# Patient Record
Sex: Female | Born: 1935
Health system: Southern US, Community
[De-identification: ages and names within clinical notes are randomized; demographics above are authoritative.]

## PROBLEM LIST (undated history)

## (undated) DIAGNOSIS — M1990 Unspecified osteoarthritis, unspecified site: Secondary | ICD-10-CM

## (undated) DIAGNOSIS — I10 Essential (primary) hypertension: Secondary | ICD-10-CM

## (undated) DIAGNOSIS — C801 Malignant (primary) neoplasm, unspecified: Secondary | ICD-10-CM

## (undated) DIAGNOSIS — E119 Type 2 diabetes mellitus without complications: Secondary | ICD-10-CM

## (undated) DIAGNOSIS — F419 Anxiety disorder, unspecified: Secondary | ICD-10-CM

## (undated) DIAGNOSIS — I509 Heart failure, unspecified: Secondary | ICD-10-CM

## (undated) DIAGNOSIS — L211 Seborrheic infantile dermatitis: Secondary | ICD-10-CM

## (undated) DIAGNOSIS — K5792 Diverticulitis of intestine, part unspecified, without perforation or abscess without bleeding: Secondary | ICD-10-CM

## (undated) DIAGNOSIS — H269 Unspecified cataract: Secondary | ICD-10-CM

## (undated) HISTORY — PX: COLON SURGERY: SHX602

## (undated) HISTORY — DX: Unspecified osteoarthritis, unspecified site: M19.90

## (undated) HISTORY — PX: ABDOMINAL HYSTERECTOMY: SHX81

## (undated) HISTORY — DX: Unspecified cataract: H26.9

## (undated) HISTORY — PX: APPENDECTOMY: SHX54

## (undated) HISTORY — DX: Anxiety disorder, unspecified: F41.9

---

## 2004-07-14 DIAGNOSIS — C801 Malignant (primary) neoplasm, unspecified: Secondary | ICD-10-CM

## 2004-07-14 HISTORY — DX: Malignant (primary) neoplasm, unspecified: C80.1

## 2005-03-04 ENCOUNTER — Ambulatory Visit: Payer: Self-pay | Admitting: Gastroenterology

## 2005-03-11 ENCOUNTER — Ambulatory Visit (HOSPITAL_COMMUNITY): Admission: RE | Admit: 2005-03-11 | Discharge: 2005-03-11 | Payer: Self-pay | Admitting: Internal Medicine

## 2005-03-11 ENCOUNTER — Ambulatory Visit: Payer: Self-pay | Admitting: Internal Medicine

## 2005-03-11 ENCOUNTER — Ambulatory Visit: Payer: Self-pay | Admitting: Cardiology

## 2005-03-11 ENCOUNTER — Encounter (INDEPENDENT_AMBULATORY_CARE_PROVIDER_SITE_OTHER): Payer: Self-pay | Admitting: Internal Medicine

## 2005-03-14 ENCOUNTER — Encounter (INDEPENDENT_AMBULATORY_CARE_PROVIDER_SITE_OTHER): Payer: Self-pay | Admitting: General Surgery

## 2005-03-14 ENCOUNTER — Inpatient Hospital Stay (HOSPITAL_COMMUNITY): Admission: RE | Admit: 2005-03-14 | Discharge: 2005-04-04 | Payer: Self-pay | Admitting: General Surgery

## 2005-05-06 ENCOUNTER — Ambulatory Visit (HOSPITAL_COMMUNITY): Payer: Self-pay | Admitting: Oncology

## 2005-05-06 ENCOUNTER — Encounter (HOSPITAL_COMMUNITY): Admission: RE | Admit: 2005-05-06 | Discharge: 2005-06-05 | Payer: Self-pay | Admitting: Oncology

## 2005-05-06 ENCOUNTER — Encounter: Admission: RE | Admit: 2005-05-06 | Discharge: 2005-05-06 | Payer: Self-pay | Admitting: Oncology

## 2005-05-08 ENCOUNTER — Encounter: Admission: RE | Admit: 2005-05-08 | Discharge: 2005-05-08 | Payer: Self-pay | Admitting: Oncology

## 2005-05-13 ENCOUNTER — Ambulatory Visit (HOSPITAL_COMMUNITY): Admission: RE | Admit: 2005-05-13 | Discharge: 2005-05-13 | Payer: Self-pay | Admitting: General Surgery

## 2005-06-24 ENCOUNTER — Ambulatory Visit (HOSPITAL_COMMUNITY): Payer: Self-pay | Admitting: Oncology

## 2005-06-24 ENCOUNTER — Encounter (HOSPITAL_COMMUNITY): Admission: RE | Admit: 2005-06-24 | Discharge: 2005-07-02 | Payer: Self-pay | Admitting: Oncology

## 2005-06-24 ENCOUNTER — Encounter: Admission: RE | Admit: 2005-06-24 | Discharge: 2005-06-24 | Payer: Self-pay | Admitting: Oncology

## 2005-07-15 ENCOUNTER — Encounter: Admission: RE | Admit: 2005-07-15 | Discharge: 2005-07-15 | Payer: Self-pay | Admitting: Oncology

## 2005-07-15 ENCOUNTER — Encounter (HOSPITAL_COMMUNITY): Admission: RE | Admit: 2005-07-15 | Discharge: 2005-08-14 | Payer: Self-pay | Admitting: Oncology

## 2005-08-07 ENCOUNTER — Encounter (HOSPITAL_COMMUNITY): Admission: RE | Admit: 2005-08-07 | Discharge: 2005-09-06 | Payer: Self-pay | Admitting: Family Medicine

## 2005-08-07 ENCOUNTER — Ambulatory Visit (HOSPITAL_COMMUNITY): Payer: Self-pay | Admitting: Family Medicine

## 2005-08-27 ENCOUNTER — Ambulatory Visit (HOSPITAL_COMMUNITY): Payer: Self-pay | Admitting: Oncology

## 2005-09-12 ENCOUNTER — Encounter: Admission: RE | Admit: 2005-09-12 | Discharge: 2005-09-12 | Payer: Self-pay | Admitting: Oncology

## 2005-09-12 ENCOUNTER — Encounter (HOSPITAL_COMMUNITY): Admission: RE | Admit: 2005-09-12 | Discharge: 2005-10-12 | Payer: Self-pay | Admitting: Oncology

## 2005-11-20 ENCOUNTER — Encounter (HOSPITAL_COMMUNITY): Admission: RE | Admit: 2005-11-20 | Discharge: 2005-12-20 | Payer: Self-pay | Admitting: Oncology

## 2005-11-20 ENCOUNTER — Encounter: Admission: RE | Admit: 2005-11-20 | Discharge: 2005-11-20 | Payer: Self-pay | Admitting: Oncology

## 2005-11-20 ENCOUNTER — Ambulatory Visit (HOSPITAL_COMMUNITY): Payer: Self-pay | Admitting: Oncology

## 2006-01-01 ENCOUNTER — Encounter (HOSPITAL_COMMUNITY): Admission: RE | Admit: 2006-01-01 | Discharge: 2006-01-31 | Payer: Self-pay | Admitting: Oncology

## 2006-01-01 ENCOUNTER — Encounter: Admission: RE | Admit: 2006-01-01 | Discharge: 2006-01-01 | Payer: Self-pay | Admitting: Oncology

## 2006-02-11 ENCOUNTER — Ambulatory Visit (HOSPITAL_COMMUNITY): Payer: Self-pay | Admitting: Oncology

## 2006-02-11 ENCOUNTER — Encounter: Admission: RE | Admit: 2006-02-11 | Discharge: 2006-02-11 | Payer: Self-pay | Admitting: Oncology

## 2006-03-17 ENCOUNTER — Encounter (HOSPITAL_COMMUNITY): Admission: RE | Admit: 2006-03-17 | Discharge: 2006-04-10 | Payer: Self-pay | Admitting: Oncology

## 2006-03-17 ENCOUNTER — Encounter: Admission: RE | Admit: 2006-03-17 | Discharge: 2006-04-10 | Payer: Self-pay | Admitting: Oncology

## 2006-03-25 ENCOUNTER — Ambulatory Visit (HOSPITAL_COMMUNITY): Admission: RE | Admit: 2006-03-25 | Discharge: 2006-03-25 | Payer: Self-pay | Admitting: General Surgery

## 2006-06-16 ENCOUNTER — Encounter (HOSPITAL_COMMUNITY): Admission: RE | Admit: 2006-06-16 | Discharge: 2006-07-13 | Payer: Self-pay | Admitting: Oncology

## 2006-06-16 ENCOUNTER — Ambulatory Visit (HOSPITAL_COMMUNITY): Payer: Self-pay | Admitting: Oncology

## 2006-09-30 ENCOUNTER — Encounter (HOSPITAL_COMMUNITY): Admission: RE | Admit: 2006-09-30 | Discharge: 2006-10-30 | Payer: Self-pay | Admitting: Oncology

## 2006-09-30 ENCOUNTER — Ambulatory Visit (HOSPITAL_COMMUNITY): Payer: Self-pay | Admitting: Oncology

## 2006-11-13 ENCOUNTER — Ambulatory Visit: Payer: Self-pay | Admitting: Internal Medicine

## 2006-11-13 ENCOUNTER — Encounter (INDEPENDENT_AMBULATORY_CARE_PROVIDER_SITE_OTHER): Payer: Self-pay | Admitting: Specialist

## 2006-11-13 ENCOUNTER — Ambulatory Visit (HOSPITAL_COMMUNITY): Admission: RE | Admit: 2006-11-13 | Discharge: 2006-11-13 | Payer: Self-pay | Admitting: Internal Medicine

## 2006-12-31 ENCOUNTER — Emergency Department (HOSPITAL_COMMUNITY): Admission: EM | Admit: 2006-12-31 | Discharge: 2006-12-31 | Payer: Self-pay | Admitting: Emergency Medicine

## 2007-01-01 ENCOUNTER — Ambulatory Visit (HOSPITAL_COMMUNITY): Payer: Self-pay | Admitting: Oncology

## 2007-01-01 ENCOUNTER — Encounter (HOSPITAL_COMMUNITY): Admission: RE | Admit: 2007-01-01 | Discharge: 2007-01-31 | Payer: Self-pay | Admitting: Oncology

## 2007-01-11 ENCOUNTER — Ambulatory Visit: Payer: Self-pay | Admitting: Internal Medicine

## 2007-01-13 ENCOUNTER — Ambulatory Visit: Payer: Self-pay | Admitting: Cardiovascular Disease

## 2007-01-13 ENCOUNTER — Encounter (HOSPITAL_COMMUNITY): Admission: RE | Admit: 2007-01-13 | Discharge: 2007-02-12 | Payer: Self-pay | Admitting: Internal Medicine

## 2007-04-01 ENCOUNTER — Encounter (HOSPITAL_COMMUNITY): Admission: RE | Admit: 2007-04-01 | Discharge: 2007-04-13 | Payer: Self-pay | Admitting: Oncology

## 2007-04-01 ENCOUNTER — Ambulatory Visit (HOSPITAL_COMMUNITY): Payer: Self-pay | Admitting: Oncology

## 2007-07-01 ENCOUNTER — Ambulatory Visit (HOSPITAL_COMMUNITY): Payer: Self-pay | Admitting: Oncology

## 2007-07-01 ENCOUNTER — Encounter (HOSPITAL_COMMUNITY): Admission: RE | Admit: 2007-07-01 | Discharge: 2007-07-14 | Payer: Self-pay | Admitting: Oncology

## 2007-09-23 ENCOUNTER — Ambulatory Visit (HOSPITAL_COMMUNITY): Payer: Self-pay | Admitting: Oncology

## 2007-09-23 ENCOUNTER — Encounter (HOSPITAL_COMMUNITY): Admission: RE | Admit: 2007-09-23 | Discharge: 2007-10-23 | Payer: Self-pay | Admitting: Oncology

## 2007-12-16 ENCOUNTER — Ambulatory Visit (HOSPITAL_COMMUNITY): Payer: Self-pay | Admitting: Oncology

## 2007-12-16 ENCOUNTER — Encounter (HOSPITAL_COMMUNITY): Admission: RE | Admit: 2007-12-16 | Discharge: 2008-01-15 | Payer: Self-pay | Admitting: Oncology

## 2008-02-23 ENCOUNTER — Inpatient Hospital Stay (HOSPITAL_COMMUNITY): Admission: EM | Admit: 2008-02-23 | Discharge: 2008-02-26 | Payer: Self-pay | Admitting: Emergency Medicine

## 2008-04-03 ENCOUNTER — Ambulatory Visit (HOSPITAL_COMMUNITY): Payer: Self-pay | Admitting: Oncology

## 2008-04-03 ENCOUNTER — Encounter (HOSPITAL_COMMUNITY): Admission: RE | Admit: 2008-04-03 | Discharge: 2008-04-10 | Payer: Self-pay | Admitting: Oncology

## 2008-06-06 ENCOUNTER — Encounter (HOSPITAL_COMMUNITY): Admission: RE | Admit: 2008-06-06 | Discharge: 2008-07-06 | Payer: Self-pay | Admitting: Oncology

## 2008-06-06 ENCOUNTER — Ambulatory Visit (HOSPITAL_COMMUNITY): Payer: Self-pay | Admitting: Oncology

## 2008-08-21 ENCOUNTER — Encounter (HOSPITAL_COMMUNITY): Admission: RE | Admit: 2008-08-21 | Discharge: 2008-09-20 | Payer: Self-pay | Admitting: Oncology

## 2008-08-21 ENCOUNTER — Ambulatory Visit (HOSPITAL_COMMUNITY): Payer: Self-pay | Admitting: Oncology

## 2008-12-04 ENCOUNTER — Ambulatory Visit (HOSPITAL_COMMUNITY): Payer: Self-pay | Admitting: Oncology

## 2008-12-04 ENCOUNTER — Encounter (HOSPITAL_COMMUNITY): Admission: RE | Admit: 2008-12-04 | Discharge: 2009-01-03 | Payer: Self-pay | Admitting: Oncology

## 2009-02-15 ENCOUNTER — Emergency Department (HOSPITAL_COMMUNITY): Admission: EM | Admit: 2009-02-15 | Discharge: 2009-02-15 | Payer: Self-pay | Admitting: Emergency Medicine

## 2009-02-26 ENCOUNTER — Ambulatory Visit (HOSPITAL_COMMUNITY): Payer: Self-pay | Admitting: Oncology

## 2009-02-26 ENCOUNTER — Encounter (HOSPITAL_COMMUNITY): Admission: RE | Admit: 2009-02-26 | Discharge: 2009-03-28 | Payer: Self-pay | Admitting: Oncology

## 2009-05-21 ENCOUNTER — Ambulatory Visit (HOSPITAL_COMMUNITY): Payer: Self-pay | Admitting: Oncology

## 2009-05-21 ENCOUNTER — Encounter (HOSPITAL_COMMUNITY): Admission: RE | Admit: 2009-05-21 | Discharge: 2009-06-20 | Payer: Self-pay | Admitting: Oncology

## 2009-08-13 ENCOUNTER — Encounter (HOSPITAL_COMMUNITY): Admission: RE | Admit: 2009-08-13 | Discharge: 2009-09-12 | Payer: Self-pay | Admitting: Oncology

## 2009-08-13 ENCOUNTER — Ambulatory Visit (HOSPITAL_COMMUNITY): Payer: Self-pay | Admitting: Oncology

## 2009-10-29 ENCOUNTER — Encounter (HOSPITAL_COMMUNITY): Admission: RE | Admit: 2009-10-29 | Discharge: 2009-11-28 | Payer: Self-pay | Admitting: Oncology

## 2009-10-29 ENCOUNTER — Ambulatory Visit (HOSPITAL_COMMUNITY): Payer: Self-pay | Admitting: Oncology

## 2010-01-21 ENCOUNTER — Ambulatory Visit (HOSPITAL_COMMUNITY): Payer: Self-pay | Admitting: Oncology

## 2010-01-21 ENCOUNTER — Encounter (HOSPITAL_COMMUNITY): Admission: RE | Admit: 2010-01-21 | Discharge: 2010-02-20 | Payer: Self-pay | Admitting: Oncology

## 2010-05-13 ENCOUNTER — Encounter (HOSPITAL_COMMUNITY)
Admission: RE | Admit: 2010-05-13 | Discharge: 2010-06-12 | Payer: Self-pay | Source: Home / Self Care | Admitting: Oncology

## 2010-05-13 ENCOUNTER — Ambulatory Visit (HOSPITAL_COMMUNITY): Payer: Self-pay | Admitting: Oncology

## 2010-07-30 ENCOUNTER — Inpatient Hospital Stay (HOSPITAL_COMMUNITY)
Admission: EM | Admit: 2010-07-30 | Discharge: 2010-08-03 | Payer: Self-pay | Source: Home / Self Care | Attending: General Surgery | Admitting: General Surgery

## 2010-07-31 LAB — URINALYSIS, ROUTINE W REFLEX MICROSCOPIC
Bilirubin Urine: NEGATIVE
Hgb urine dipstick: NEGATIVE
Ketones, ur: NEGATIVE mg/dL
Nitrite: NEGATIVE
Protein, ur: NEGATIVE mg/dL
Specific Gravity, Urine: 1.025 (ref 1.005–1.030)
Urine Glucose, Fasting: NEGATIVE mg/dL
Urobilinogen, UA: 0.2 mg/dL (ref 0.0–1.0)
pH: 6 (ref 5.0–8.0)

## 2010-07-31 LAB — DIFFERENTIAL
Basophils Absolute: 0 10*3/uL (ref 0.0–0.1)
Basophils Relative: 0 % (ref 0–1)
Eosinophils Absolute: 0 10*3/uL (ref 0.0–0.7)
Eosinophils Relative: 0 % (ref 0–5)
Lymphocytes Relative: 11 % — ABNORMAL LOW (ref 12–46)
Lymphs Abs: 1.8 10*3/uL (ref 0.7–4.0)
Monocytes Absolute: 0.9 10*3/uL (ref 0.1–1.0)
Monocytes Relative: 5 % (ref 3–12)
Neutro Abs: 13.3 10*3/uL — ABNORMAL HIGH (ref 1.7–7.7)
Neutrophils Relative %: 83 % — ABNORMAL HIGH (ref 43–77)

## 2010-07-31 LAB — CBC
HCT: 39.2 % (ref 36.0–46.0)
Hemoglobin: 13.9 g/dL (ref 12.0–15.0)
MCH: 29.3 pg (ref 26.0–34.0)
MCHC: 35.5 g/dL (ref 30.0–36.0)
MCV: 82.5 fL (ref 78.0–100.0)
Platelets: 337 10*3/uL (ref 150–400)
RBC: 4.75 MIL/uL (ref 3.87–5.11)
RDW: 13 % (ref 11.5–15.5)
WBC: 16 10*3/uL — ABNORMAL HIGH (ref 4.0–10.5)

## 2010-07-31 LAB — HEPATIC FUNCTION PANEL
ALT: 17 U/L (ref 0–35)
AST: 19 U/L (ref 0–37)
Albumin: 3.2 g/dL — ABNORMAL LOW (ref 3.5–5.2)
Alkaline Phosphatase: 65 U/L (ref 39–117)
Bilirubin, Direct: 0.1 mg/dL (ref 0.0–0.3)
Indirect Bilirubin: 0.2 mg/dL — ABNORMAL LOW (ref 0.3–0.9)
Total Bilirubin: 0.3 mg/dL (ref 0.3–1.2)
Total Protein: 7.6 g/dL (ref 6.0–8.3)

## 2010-07-31 LAB — BASIC METABOLIC PANEL
BUN: 18 mg/dL (ref 6–23)
CO2: 23 mEq/L (ref 19–32)
Calcium: 9.3 mg/dL (ref 8.4–10.5)
Chloride: 102 mEq/L (ref 96–112)
Creatinine, Ser: 0.81 mg/dL (ref 0.4–1.2)
GFR calc Af Amer: >60 mL/min (ref >60)
GFR calc non Af Amer: >60 mL/min (ref >60)
Glucose, Bld: 254 mg/dL — ABNORMAL HIGH (ref 70–99)
Potassium: 4.3 mEq/L (ref 3.5–5.1)
Sodium: 134 mEq/L — ABNORMAL LOW (ref 135–145)

## 2010-07-31 LAB — LIPASE, BLOOD: Lipase: 19 U/L (ref 11–59)

## 2010-08-05 ENCOUNTER — Encounter (HOSPITAL_COMMUNITY): Payer: Self-pay | Admitting: Oncology

## 2010-08-05 LAB — GLUCOSE, CAPILLARY
Glucose-Capillary: 210 mg/dL — ABNORMAL HIGH (ref 70–99)
Glucose-Capillary: 141 mg/dL — ABNORMAL HIGH (ref 70–99)
Glucose-Capillary: 129 mg/dL — ABNORMAL HIGH (ref 70–99)
Glucose-Capillary: 134 mg/dL — ABNORMAL HIGH (ref 70–99)
Glucose-Capillary: 172 mg/dL — ABNORMAL HIGH (ref 70–99)
Glucose-Capillary: 227 mg/dL — ABNORMAL HIGH (ref 70–99)

## 2010-08-05 LAB — DIFFERENTIAL
Basophils Absolute: 0 10*3/uL (ref 0.0–0.1)
Basophils Absolute: 0 10*3/uL (ref 0.0–0.1)
Basophils Relative: 0 % (ref 0–1)
Eosinophils Relative: 1 % (ref 0–5)
Eosinophils Relative: 3 % (ref 0–5)
Lymphocytes Relative: 32 % (ref 12–46)
Lymphocytes Relative: 33 % (ref 12–46)
Lymphocytes Relative: 28 % (ref 12–46)
Lymphs Abs: 2.4 10*3/uL (ref 0.7–4.0)
Monocytes Absolute: 0.9 10*3/uL (ref 0.1–1.0)
Monocytes Relative: 8 % (ref 3–12)
Monocytes Relative: 8 % (ref 3–12)
Neutro Abs: 6 10*3/uL (ref 1.7–7.7)
Neutro Abs: 4.2 10*3/uL (ref 1.7–7.7)
Neutrophils Relative %: 56 % (ref 43–77)
Neutrophils Relative %: 60 % (ref 43–77)

## 2010-08-05 LAB — CBC
HCT: 35.7 % — ABNORMAL LOW (ref 36.0–46.0)
HCT: 35.7 % — ABNORMAL LOW (ref 36.0–46.0)
HCT: 37 % (ref 36.0–46.0)
Hemoglobin: 12.3 g/dL (ref 12.0–15.0)
Hemoglobin: 12.8 g/dL (ref 12.0–15.0)
MCH: 29 pg (ref 26.0–34.0)
MCH: 29.4 pg (ref 26.0–34.0)
MCHC: 34.5 g/dL (ref 30.0–36.0)
MCHC: 34.6 g/dL (ref 30.0–36.0)
MCV: 84.2 fL (ref 78.0–100.0)
MCV: 85.4 fL (ref 78.0–100.0)
RBC: 4.18 MIL/uL (ref 3.87–5.11)
RBC: 4.39 MIL/uL (ref 3.87–5.11)
RDW: 13.3 % (ref 11.5–15.5)
WBC: 7.3 10*3/uL (ref 4.0–10.5)
WBC: 7.1 10*3/uL (ref 4.0–10.5)

## 2010-08-05 LAB — BASIC METABOLIC PANEL
CO2: 24 mEq/L (ref 19–32)
CO2: 25 mEq/L (ref 19–32)
Calcium: 8.4 mg/dL (ref 8.4–10.5)
Chloride: 106 mEq/L (ref 96–112)
Creatinine, Ser: 0.8 mg/dL (ref 0.4–1.2)
GFR calc Af Amer: >60 mL/min (ref >60)
GFR calc Af Amer: >60 mL/min (ref >60)
Glucose, Bld: 196 mg/dL — ABNORMAL HIGH (ref 70–99)
Glucose, Bld: 141 mg/dL — ABNORMAL HIGH (ref 70–99)
Potassium: 3.9 mEq/L (ref 3.5–5.1)
Sodium: 138 mEq/L (ref 135–145)

## 2010-08-05 LAB — COMPREHENSIVE METABOLIC PANEL
ALT: 12 U/L (ref 0–35)
BUN: 16 mg/dL (ref 6–23)
CO2: 25 mEq/L (ref 19–32)
Calcium: 8.7 mg/dL (ref 8.4–10.5)
Creatinine, Ser: 0.85 mg/dL (ref 0.4–1.2)
GFR calc non Af Amer: >60 mL/min (ref >60)
Glucose, Bld: 198 mg/dL — ABNORMAL HIGH (ref 70–99)
Total Protein: 6.4 g/dL (ref 6.0–8.3)

## 2010-08-05 LAB — PHOSPHORUS
Phosphorus: 2.6 mg/dL (ref 2.3–4.6)
Phosphorus: 2.8 mg/dL (ref 2.3–4.6)

## 2010-08-05 LAB — MRSA PCR SCREENING: MRSA by PCR: NEGATIVE

## 2010-08-06 LAB — CBC
HCT: 34.7 % — ABNORMAL LOW (ref 36.0–46.0)
Hemoglobin: 12.1 g/dL (ref 12.0–15.0)
MCH: 29.4 pg (ref 26.0–34.0)
MCHC: 34.9 g/dL (ref 30.0–36.0)
RDW: 13 % (ref 11.5–15.5)

## 2010-08-06 LAB — GLUCOSE, CAPILLARY: Glucose-Capillary: 217 mg/dL — ABNORMAL HIGH (ref 70–99)

## 2010-08-06 LAB — BASIC METABOLIC PANEL
CO2: 25 mEq/L (ref 19–32)
Calcium: 8.4 mg/dL (ref 8.4–10.5)
Creatinine, Ser: 0.82 mg/dL (ref 0.4–1.2)
GFR calc non Af Amer: >60 mL/min (ref >60)
Glucose, Bld: 223 mg/dL — ABNORMAL HIGH (ref 70–99)

## 2010-08-06 LAB — DIFFERENTIAL
Basophils Relative: 0 % (ref 0–1)
Eosinophils Relative: 4 % (ref 0–5)
Lymphocytes Relative: 31 % (ref 12–46)
Monocytes Absolute: 0.7 10*3/uL (ref 0.1–1.0)
Monocytes Relative: 9 % (ref 3–12)
Neutro Abs: 4 10*3/uL (ref 1.7–7.7)

## 2010-08-16 ENCOUNTER — Emergency Department (HOSPITAL_COMMUNITY)
Admission: EM | Admit: 2010-08-16 | Discharge: 2010-08-16 | Disposition: A | Discharge status: Home / Self Care | Payer: Medicare Other | Attending: Emergency Medicine | Admitting: Emergency Medicine

## 2010-08-16 ENCOUNTER — Emergency Department (HOSPITAL_COMMUNITY): Payer: Medicare Other

## 2010-08-16 DIAGNOSIS — E119 Type 2 diabetes mellitus without complications: Secondary | ICD-10-CM | POA: Insufficient documentation

## 2010-08-16 DIAGNOSIS — Z79899 Other long term (current) drug therapy: Secondary | ICD-10-CM | POA: Insufficient documentation

## 2010-08-16 DIAGNOSIS — K5900 Constipation, unspecified: Secondary | ICD-10-CM | POA: Insufficient documentation

## 2010-08-16 DIAGNOSIS — I2510 Atherosclerotic heart disease of native coronary artery without angina pectoris: Secondary | ICD-10-CM | POA: Insufficient documentation

## 2010-08-16 DIAGNOSIS — R630 Anorexia: Secondary | ICD-10-CM | POA: Insufficient documentation

## 2010-08-16 DIAGNOSIS — I10 Essential (primary) hypertension: Secondary | ICD-10-CM | POA: Insufficient documentation

## 2010-08-16 DIAGNOSIS — E785 Hyperlipidemia, unspecified: Secondary | ICD-10-CM | POA: Insufficient documentation

## 2010-08-16 DIAGNOSIS — R1915 Other abnormal bowel sounds: Secondary | ICD-10-CM | POA: Insufficient documentation

## 2010-08-16 DIAGNOSIS — Z85038 Personal history of other malignant neoplasm of large intestine: Secondary | ICD-10-CM | POA: Insufficient documentation

## 2010-08-16 DIAGNOSIS — Z7982 Long term (current) use of aspirin: Secondary | ICD-10-CM | POA: Insufficient documentation

## 2010-08-16 DIAGNOSIS — Z794 Long term (current) use of insulin: Secondary | ICD-10-CM | POA: Insufficient documentation

## 2010-08-16 DIAGNOSIS — R1031 Right lower quadrant pain: Secondary | ICD-10-CM | POA: Insufficient documentation

## 2010-08-16 LAB — URINALYSIS, ROUTINE W REFLEX MICROSCOPIC
Protein, ur: NEGATIVE mg/dL
Urine Glucose, Fasting: NEGATIVE mg/dL
Urobilinogen, UA: 0.2 mg/dL (ref 0.0–1.0)

## 2010-08-16 LAB — COMPREHENSIVE METABOLIC PANEL
Alkaline Phosphatase: 65 U/L (ref 39–117)
BUN: 21 mg/dL (ref 6–23)
Creatinine, Ser: 1.05 mg/dL (ref 0.4–1.2)
Glucose, Bld: 158 mg/dL — ABNORMAL HIGH (ref 70–99)
Potassium: 4.5 mEq/L (ref 3.5–5.1)
Total Protein: 8.3 g/dL (ref 6.0–8.3)

## 2010-08-16 LAB — DIFFERENTIAL
Eosinophils Relative: 2 % (ref 0–5)
Lymphocytes Relative: 22 % (ref 12–46)
Lymphs Abs: 2.9 10*3/uL (ref 0.7–4.0)
Monocytes Absolute: 1 10*3/uL (ref 0.1–1.0)
Monocytes Relative: 8 % (ref 3–12)

## 2010-08-16 LAB — CBC
HCT: 36.8 % (ref 36.0–46.0)
MCH: 28.6 pg (ref 26.0–34.0)
MCHC: 33.4 g/dL (ref 30.0–36.0)
MCV: 85.6 fL (ref 78.0–100.0)
RDW: 13.4 % (ref 11.5–15.5)
WBC: 13 10*3/uL — ABNORMAL HIGH (ref 4.0–10.5)

## 2010-08-16 LAB — URINE MICROSCOPIC-ADD ON

## 2010-08-28 NOTE — H&P (Signed)
[name], [name]                ACCOUNT NO.:  [id number]  MEDICAL RECORD NO.:  [id number]          PATIENT TYPE:  INP  LOCATION:  A217                          FACILITY:  [facility]  PHYSICIAN:  Tilford Pillar, MD      DATE OF BIRTH:  03-10-1931  DATE OF ADMISSION:  07/30/2010 DATE OF DISCHARGE:  LH                             HISTORY & PHYSICAL   ADMISSION DIAGNOSES:  Small bowel obstruction.  HISTORY OF PRESENT ILLNESS:  The patient is a 77 year old female who presented to [facility] with several day history of worsening nausea, vomiting, abdominal pain.  She actually had some diarrhea over the weekend and had taken some antidiarrheal medications.  Since that time, has not had any bowel function, has noted increased distention and episodes of nausea.  She did have several episodes of emesis prior to the emergent evaluation in the emergency department.  She has not had any other symptomatology similar to this.  She denies any hematemesis. She has not had any melena or hematochezia prior to her decreased bowel function.  She has noted a colicky pain, which has improved slightly since she has been n.p.o. after her initial evaluation by the emergency room physician.  PAST MEDICAL HISTORY:  Hypertension, hypercholesterolemia, anxiety, diabetes.  PAST SURGICAL HISTORY:  Hysterectomy, tonsils and adenoidectomy.  MEDICATIONS:  Benicar, simvastatin, Xanax, Lantus, NovoLog, aspirin 81 mg, amlodipine.  ALLERGIES:  No known drug allergies.  SOCIAL HISTORY:  No tobacco, no alcohol, no recreational drug abuse.  FAMILY HISTORY:  Noncontributory.  REVIEW OF SYSTEMS:  CONSTITUTIONAL:  Unremarkable.  EYES:  Unremarkable. EARS, NOSE, AND THROAT:  Unremarkable.  RESPIRATORY:  Unremarkable. CARDIOVASCULAR:  Unremarkable.  GASTROINTESTINAL:  As per HPI. GENITOURINARY:  Unremarkable.  MUSCULOSKELETAL:  Unremarkable.  SKIN: Unremarkable.  NEUROLOGIC:  Unremarkable.  ENDOCRINE:   Unremarkable.  PHYSICAL EXAMINATION:  VITAL SIGNS:  Temperature 97.5, heart rate 70, respirations 18, blood pressure 143/73.  She is 93% O2 saturation on room air. GENERAL:  She is not in any acute distress.  She is alert and oriented x3. HEENT:  Scalp no deformities or masses.  Eyes: Pupils equal, round, and reactive.  Extraocular movements are intact.  No scleral icterus or conjunctival pallor is noted.  Oral mucosa pink.  Normal occlusion. NECK:  Trachea is midline.  No cervical lymphadenopathy. PULMONARY:  Unlabored respiration.  She is clear to auscultation bilaterally. CARDIOVASCULAR:  Regular rate and rhythm.  The murmurs or gallops.  She has 2+ radial, femoral, and dorsalis pedis pulses bilaterally. ABDOMEN:  Diminished bowel sounds.  Abdomen is soft, distended, mildly tender diffusely, but no peritoneal signs are elicited.  No hernias or masses are apparent. EXTREMITIES:  Warm and dry.  PERTINENT LABORATORY AND RADIOGRAPHIC STUDIES:  CBC, white blood cell count 16.0 on admission, hemoglobin 13.9, hematocrit 39.2, platelets 337.  WBCs on the morning of admission was 10.2.  Basic metabolic panel, sodium 136, potassium 4.0, chloride 105, bicarb 25, BUN 16, creatinine 0.85, blood glucose 198.  CT of the abdomen and pelvis demonstrates several loops of distended small bowel.  There is no clear transition point, no significant free  air.  There is free fluid noted within the pelvis.  No hernias or masses are apparent.  ASSESSMENT AND PLAN:  Small bowel obstruction.  At this time, findings were discussed with the patient.  At this time, we will continue the patient in n.p.o. status, continue IV fluid hydration, continue bowel rest.  The indications for surgical intervention including increasing leukocytosis, increased pain, increased temperature or tachycardia or hypotension were discussed with the patient as well as non-progression should she continued to have non-resolution of  her symptomatology.  At this time, we will continue conservative management.  The patient understands this and is agreeable to current plan.  She will be admitted for continued monitoring and management.     Tilford Pillar, MD     BZ/MEDQ  D:  07/31/2010  T:  08/01/2010  Job:  [id number]  cc:   Dr. Margo Aye  Electronically Signed by Tilford Pillar MD on 08/28/2010 11:08:50 AM

## 2010-09-11 NOTE — Discharge Summary (Addendum)
  [name], [name]                ACCOUNT NO.:  [id number]  MEDICAL RECORD NO.:  [id number]          PATIENT TYPE:  E  LOCATION:  MCED                         FACILITY:  [facility]  PHYSICIAN:  Tilford Pillar, MD      DATE OF BIRTH:  06/12/1931  DATE OF ADMISSION:  07/30/2010 DATE OF DISCHARGE:  01/21/2012LH                              DISCHARGE SUMMARY   ADMISSION DIAGNOSIS:  Small bowel obstruction.  DISCHARGE DIAGNOSES: 1. Resolution of small bowel obstruction. 2. Hypertension. 3. Hypercholesterolemia. 4. Anxiety. 5. Diabetes.  DISPOSITION:  Home.  BRIEF HISTORY AND PHYSICAL:  Please see the admission history and physical for the complete H and P.  The patient is a 77 year old female who presented to [facility] with worsening of abdominal pain. Her symptoms aside her symptoms in evaluation and physical exam findings were consistent with a suspected small bowel obstruction.  She was admitted for continued management intervention.  HOSPITAL COURSE:  The patient was admitted on July 30, 2010.  She was kept on n.p.o. status was treated conservatively for suspected small bowel obstruction due to adhesions due to the significant amount of abdominal surgeries the patient has undergone previously undergone.  At this point, she is continued on IV fluid hydration.  Her symptomatologies slowly started to improve.  She did start passing flatus and had bowel function.  At which time, she was advanced back on a diet slowly as tolerated.  Upon tolerating a regular diet and no return of her symptoms and no issues with her bowel function.  She was discharged to home.  She was discharged on August 03, 2010.  DISCHARGE INSTRUCTIONS:  She was instructed to continue activity as tolerated.  She is to avoid high residual diet.  Although she may resume a diet as tolerated, she is return to see me as needed.  She is to call if she had any questions or concerns.  DISCHARGE  MEDICATIONS:  Please see the discharge medication reconciliation sheet for all discharge medications.     Tilford Pillar, MD     BZ/MEDQ  D:  09/04/2010  T:  09/05/2010  Job:  [id number]  Electronically Signed by Tilford Pillar MD on 11/27/2010 10:34:39 AM

## 2010-09-25 LAB — COMPREHENSIVE METABOLIC PANEL
BUN: 17 mg/dL (ref 6–23)
CO2: 23 mEq/L (ref 19–32)
Chloride: 106 mEq/L (ref 96–112)
Creatinine, Ser: 0.83 mg/dL (ref 0.4–1.2)
GFR calc non Af Amer: >60 mL/min (ref >60)
Glucose, Bld: 110 mg/dL — ABNORMAL HIGH (ref 70–99)
Total Bilirubin: 0.2 mg/dL — ABNORMAL LOW (ref 0.3–1.2)

## 2010-09-25 LAB — CBC
HCT: 38 % (ref 36.0–46.0)
Hemoglobin: 12.7 g/dL (ref 12.0–15.0)
MCH: 29.3 pg (ref 26.0–34.0)
MCHC: 33.5 g/dL (ref 30.0–36.0)

## 2010-09-29 LAB — COMPREHENSIVE METABOLIC PANEL
ALT: 15 U/L (ref 0–35)
AST: 20 U/L (ref 0–37)
AST: 19 U/L (ref 0–37)
Alkaline Phosphatase: 67 U/L (ref 39–117)
CO2: 23 mEq/L (ref 19–32)
CO2: 23 mEq/L (ref 19–32)
Calcium: 9 mg/dL (ref 8.4–10.5)
Calcium: 9.1 mg/dL (ref 8.4–10.5)
Chloride: 105 mEq/L (ref 96–112)
Creatinine, Ser: 0.95 mg/dL (ref 0.4–1.2)
GFR calc Af Amer: >60 mL/min (ref >60)
GFR calc non Af Amer: 57 mL/min — ABNORMAL LOW (ref >60)
Glucose, Bld: 292 mg/dL — ABNORMAL HIGH (ref 70–99)
Glucose, Bld: 190 mg/dL — ABNORMAL HIGH (ref 70–99)
Potassium: 4.4 mEq/L (ref 3.5–5.1)
Sodium: 136 mEq/L (ref 135–145)
Total Bilirubin: 0.4 mg/dL (ref 0.3–1.2)
Total Protein: 7.4 g/dL (ref 6.0–8.3)

## 2010-09-29 LAB — DIFFERENTIAL
Basophils Relative: 2 % — ABNORMAL HIGH (ref 0–1)
Eosinophils Absolute: 0.2 10*3/uL (ref 0.0–0.7)
Eosinophils Relative: 2 % (ref 0–5)
Lymphs Abs: 1.8 10*3/uL (ref 0.7–4.0)
Monocytes Relative: 7 % (ref 3–12)

## 2010-09-29 LAB — CBC
HCT: 40.2 % (ref 36.0–46.0)
Hemoglobin: 13.7 g/dL (ref 12.0–15.0)
Hemoglobin: 13.5 g/dL (ref 12.0–15.0)
Platelets: 255 10*3/uL (ref 150–400)
RBC: 4.58 MIL/uL (ref 3.87–5.11)
RDW: 13.6 % (ref 11.5–15.5)
WBC: 7 10*3/uL (ref 4.0–10.5)

## 2010-10-19 LAB — CBC
HCT: 38.8 % (ref 36.0–46.0)
HCT: 37.3 % (ref 36.0–46.0)
Hemoglobin: 13.5 g/dL (ref 12.0–15.0)
Hemoglobin: 13 g/dL (ref 12.0–15.0)
MCHC: 34.8 g/dL (ref 30.0–36.0)
MCHC: 35 g/dL (ref 30.0–36.0)
MCV: 87.1 fL (ref 78.0–100.0)
MCV: 86.7 fL (ref 78.0–100.0)
Platelets: 280 10*3/uL (ref 150–400)
Platelets: 250 10*3/uL (ref 150–400)
RBC: 4.3 MIL/uL (ref 3.87–5.11)
RDW: 13.4 % (ref 11.5–15.5)
RDW: 14 % (ref 11.5–15.5)
WBC: 7.4 10*3/uL (ref 4.0–10.5)

## 2010-10-19 LAB — COMPREHENSIVE METABOLIC PANEL
ALT: 17 U/L (ref 0–35)
AST: 20 U/L (ref 0–37)
Albumin: 3.5 g/dL (ref 3.5–5.2)
Alkaline Phosphatase: 56 U/L (ref 39–117)
BUN: 24 mg/dL — ABNORMAL HIGH (ref 6–23)
BUN: 16 mg/dL (ref 6–23)
CO2: 27 mEq/L (ref 19–32)
CO2: 26 mEq/L (ref 19–32)
Calcium: 9.2 mg/dL (ref 8.4–10.5)
Chloride: 109 mEq/L (ref 96–112)
Creatinine, Ser: 0.86 mg/dL (ref 0.4–1.2)
GFR calc non Af Amer: >60 mL/min (ref >60)
Glucose, Bld: 120 mg/dL — ABNORMAL HIGH (ref 70–99)
Potassium: 4 mEq/L (ref 3.5–5.1)
Total Bilirubin: 0.3 mg/dL (ref 0.3–1.2)

## 2010-10-19 LAB — DIFFERENTIAL
Basophils Absolute: 0 10*3/uL (ref 0.0–0.1)
Basophils Relative: 0 % (ref 0–1)
Eosinophils Absolute: 0.1 K/uL (ref 0.0–0.7)
Eosinophils Relative: 1 % (ref 0–5)
Lymphocytes Relative: 18 % (ref 12–46)
Lymphs Abs: 1.3 K/uL (ref 0.7–4.0)
Monocytes Absolute: 0.6 10*3/uL (ref 0.1–1.0)
Monocytes Relative: 8 % (ref 3–12)
Neutro Abs: 5.4 10*3/uL (ref 1.7–7.7)
Neutrophils Relative %: 72 % (ref 43–77)

## 2010-10-19 LAB — POCT CARDIAC MARKERS
CKMB, poc: <1 ng/mL — ABNORMAL LOW (ref 1.0–8.0)
Myoglobin, poc: 107 ng/mL (ref 12–200)
Troponin i, poc: <0.05 ng/mL (ref 0.00–0.09)

## 2010-10-19 LAB — COMPREHENSIVE METABOLIC PANEL WITH GFR
ALT: 18 U/L (ref 0–35)
Calcium: 9.1 mg/dL (ref 8.4–10.5)
Creatinine, Ser: 0.78 mg/dL (ref 0.4–1.2)
GFR calc Af Amer: >60 mL/min (ref >60)
GFR calc non Af Amer: >60 mL/min (ref >60)
Glucose, Bld: 108 mg/dL — ABNORMAL HIGH (ref 70–99)
Sodium: 138 meq/L (ref 135–145)
Total Protein: 7.7 g/dL (ref 6.0–8.3)

## 2010-10-19 LAB — BRAIN NATRIURETIC PEPTIDE: Pro B Natriuretic peptide (BNP): 44.5 pg/mL (ref 0.0–100.0)

## 2010-10-24 LAB — COMPREHENSIVE METABOLIC PANEL
ALT: 15 U/L (ref 0–35)
AST: 20 U/L (ref 0–37)
Albumin: 3.2 g/dL — ABNORMAL LOW (ref 3.5–5.2)
Calcium: 8.6 mg/dL (ref 8.4–10.5)
Creatinine, Ser: 0.92 mg/dL (ref 0.4–1.2)
GFR calc non Af Amer: 60 mL/min — ABNORMAL LOW (ref >60)
Potassium: 4.1 mEq/L (ref 3.5–5.1)
Total Protein: 7.5 g/dL (ref 6.0–8.3)

## 2010-10-24 LAB — CBC
HCT: 37.9 % (ref 36.0–46.0)
MCV: 87.1 fL (ref 78.0–100.0)
RBC: 4.35 MIL/uL (ref 3.87–5.11)
WBC: 10.9 10*3/uL — ABNORMAL HIGH (ref 4.0–10.5)

## 2010-10-29 LAB — DIFFERENTIAL
Basophils Relative: 1 % (ref 0–1)
Eosinophils Absolute: 0.2 10*3/uL (ref 0.0–0.7)
Neutro Abs: 4.4 10*3/uL (ref 1.7–7.7)
Neutrophils Relative %: 65 % (ref 43–77)

## 2010-10-29 LAB — CBC
HCT: 38 % (ref 36.0–46.0)
Hemoglobin: 13.1 g/dL (ref 12.0–15.0)
MCV: 86.5 fL (ref 78.0–100.0)
Platelets: 254 10*3/uL (ref 150–400)
RDW: 13.8 % (ref 11.5–15.5)

## 2010-10-29 LAB — COMPREHENSIVE METABOLIC PANEL
ALT: 15 U/L (ref 0–35)
Alkaline Phosphatase: 51 U/L (ref 39–117)
BUN: 16 mg/dL (ref 6–23)
CO2: 23 mEq/L (ref 19–32)
Calcium: 8.9 mg/dL (ref 8.4–10.5)
GFR calc non Af Amer: >60 mL/min (ref >60)
Glucose, Bld: 189 mg/dL — ABNORMAL HIGH (ref 70–99)
Sodium: 138 mEq/L (ref 135–145)
Total Protein: 6.8 g/dL (ref 6.0–8.3)

## 2010-11-05 ENCOUNTER — Other Ambulatory Visit (HOSPITAL_COMMUNITY): Payer: Self-pay

## 2010-11-26 NOTE — Assessment & Plan Note (Signed)
[facility]                       [place] [facility] OFFICE NOTE   NAME:[name], [name]                       MRN:          [id number]  DATE:01/11/2007                            DOB:          [birth year]    IDENTIFICATION:  [name] is usually followed by Dr. Renard Matter.  She is  referred today for evaluation of syncope.   HISTORY OF PRESENT ILLNESS:  The patient is a really difficult  historian.  She was seen in the emergency room back on the 19th.  She  was home that night.  She had taken her insulin at dinner time and then  she had a cheese sandwich and this was now near 1 a.m.  She was then on  her bed feeling flushed and hot and questioned dizzy but overall warm.  The sister came in (had been laying on the sofa) and it appears the  patient had passed out.  The sister was fanning her.  The patient then  got up.  Her sugars were checked in the 60s.  She was not feeling well,  went back down to sit, called the paramedics and they had then brought  her to the emergency room.  She did not take anything for her glucose.   The patient says this is the lowest, 63 was the lowest her glucose has  ever been.  She is usually above 100.   She denies chest pain, does give out some with activity, does not do  much overall.   PAST MEDICAL HISTORY:  1. Diabetes.  2. Hypertension.   PAST SURGICAL HISTORY:  Hysterectomy.   ALLERGIES:  None.   MEDICATIONS:  1. Lantus 70 units daily.  2. Novolin sliding scale.  3. Gabapentin 100 a.m., 200 p.m.  4. Simvastatin 40.  5. Benicar HCTZ 40/25.  6. Amlodipine 10.  7. Fish oil 1 gram daily.  8. Aspirin 81.  9. Multivitamin.   SOCIAL HISTORY:  The patient does not smoke, does not drink.   FAMILY HISTORY:  Father died of an MI at age 66.  Mother died age 47 of  natural causes.  Four brothers, one died of an MI, one died of cancer.  Three sisters, one died of cancer, one died of old age, one died  unknown.   Four sisters are alive.   REVIEW OF SYSTEMS:  All systems are reviewed.  Negative to the above  problem except as noted.   PHYSICAL EXAMINATION:  GENERAL:  The patient is in no distress.  VITAL SIGNS:  Blood pressure 151/70, pulse is 72, weight 176.  HEENT:  Normocephalic atraumatic.  EOMI.  PERRL.  NECK:  JVP is normal.  No thyromegaly.  No bruits.  LUNGS:  Clear without rales or wheezes.  CARDIAC:  Regular rate and rhythm.  S1 S2.  No S3, S4, or murmurs.  ABDOMEN:  Supple, nontender.  No hepatomegaly.  No masses.  Normal bowel  sounds.  EXTREMITIES:  One plus posterior tibial.  Feet warm.  No lower extremity  edema.   A 12-lead EKG:  Normal sinus rhythm, 72 beats per minute.  IMPRESSION:  1. [name] is a very difficult historian.  She had an episode of      syncope, may have been related to her low glucose, a lot of      flushing, sounds more vagal, still she has a history of long-      standing diabetes that is not the best controlled.  Hemoglobin A1c      of 8.5.  I would recommend carotid Dopplers and adenosine Myoview.      I will be in touch with her regarding the results.  Continue on      medicines.  She was counseled that if she ever gets these spells      again, she should not move to different places in the house but      stay and seek assistance.  2. Dyslipidemia.  Again, LDL was around 101 earlier this spring,      Simvastatin had been increased but she has not had it rechecked.      Would continue followup with Dr. Renard Matter.  I have not set a      definite followup.  Will be in touch with the patient regarding the      results and where to proceed.     Pricilla Riffle, MD, [facility]  Electronically Signed    PVR/MedQ  DD: 01/11/2007  DT: 01/11/2007  Job #: [id number]   cc:   Angus G. Renard Matter, MD

## 2010-11-26 NOTE — H&P (Signed)
NAMEZAMARA, COZAD                ACCOUNT NO.:  [id number]   MEDICAL RECORD NO.:  [id number]          PATIENT TYPE:  INP   LOCATION:  A330                          FACILITY:  [facility]   PHYSICIAN:  Catalina Pizza, M.D.        DATE OF BIRTH:  03-14-1931   DATE OF ADMISSION:  02/22/2008  DATE OF DISCHARGE:  LH                              HISTORY & PHYSICAL   CHIEF COMPLAINT:  Abdominal pain.   HISTORY OF PRESENT ILLNESS:  Ms. Hink is a 77 year old pleasant white  female who was in her usual state of health without any significant  problems until yesterday morning and was having normal bowel movements  when began having lower abdominal cramping type pain starting  approximately midday.  She did have a normal bowel movement earlier in  the day, but the pain continued to progress to 10/10 worse pain and she  states she has ever felt until coming into the emergency department.  With time emergency room doctor assessed her and found to have of a  small bowel obstruction on CT scan.  She denies any significant  constipation.  Did have bowel movement just before come to the emergency  department with no blood in her stools.  No black tarry stools.  No  specific nausea and vomiting, although the pain did make her feel  somewhat nauseous.  Pain did not radiate anywhere specifically just  lower quadrant more on the left lower quadrant than anywhere else.   PAST MEDICAL HISTORY:  1. She has diabetes mellitus type 2, insulin dependent.  2. Hypertension.  3. Hyperlipidemia.   PAST SURGICAL HISTORY:  Has had a hysterectomy and had a tonsillectomy  as well as a colon resection due to adenocarcinoma of the rectum and she  also did have chemotherapy as well with this.   ALLERGIES:  No known drug allergies.   SOCIAL HISTORY:  At this time she lives by herself.  She is a nonsmoker,  nondrinker.  No other drug use.   CURRENT MEDICATIONS:  1. She is on Benicar 40/12.5.  2. Amlodipine 10 mg p.o.  daily.  3. Lantus 70 units subcu nightly.  4. Coreg 12.5 mg b.i.d.   FAMILY HISTORY:  Father died of MI at 14.  Mother died at 66 of natural  causes, 4 brothers one died of MI, one died of cancer.  Three sisters,  one died of cancer, one of old age.  Four sisters still alive.   REVIEW OF SYSTEMS:  As mentioned above.  The patient has lower abdominal  pain.  She denies any specific headaches.  No weakness or dizziness.  No  fever.  No problems with chest pain.  No shortness of breath.  No other  neurologic complaints.  All other review of systems is negative except  for GI complaints.   PHYSICAL EXAMINATION:  VITAL SIGNS:  Temperature is 97.6, blood pressure  160/75, pulse 73, respirations 22, sating 93% on room air.  GENERAL:  This is a pale, sick appearing white female, lying in bed in  no acute distress.  HEENT:  Unremarkable.  Pupils equal, round, reactive to light and  accommodation.  Does have NG tube in place.  NECK:  Supple.  No thyromegaly.  No carotid bruits.  LUNGS: Clear to auscultation bilaterally.  CARDIAC:  Regular rate and rhythm.  No murmurs, gallops or rubs.  ABDOMEN:  Diffusely lower abdominal tenderness, left lower quadrant  worse than anywhere.  Positive bowel sounds.  Somewhat tympanic, but  pain overall is much improved from the emergency department.  EXTREMITIES:  No lower extremity edema, 2+ pulses in lower extremities.  SKIN:  The patient has erythematous vesicular type rash in lower  extremities which she relates to exposed to poison ivy.   LABORATORY DATA:  UA shows negative for everything.  Initial CBC shows  white count of 12.5, hemoglobin of 13.1, platelet count of 297, absolute  neutrophil count of 9.4.  CMET shows sodium of 139, potassium 3.5,  chloride 110, CO2 21, glucose 153, BUN 18, creatinine is 0.81, total  bili 0.8, alk phos 56, SGOT 26, SGPT 21, total protein 7.7, albumin 3.7,  calcium of 9.7, lipase was 17.  CEA was obtained and showed  1.9.   RADIOGRAPHIC FINDINGS:  Initial CT of abdomen and pelvis showed small  bowel obstruction identified in the mid small bowel.  Transition zone is  identified in anterior aspect of left lower quadrant with small bowel  feces proximally transition zone, interloop mesenteric fluid is seen in  the region of the dilated small bowel.  Acute abdominal series obtained  this morning and follow after NG tube placement showed improved partial  small bowel obstruction.   IMPRESSION:  This is a 77 year old wall white female with previous  history of  colon resection secondary to cancer with new onset of small  bowel obstruction.   ASSESSMENT AND PLAN:  1. Small bowel obstruction.  The patient was seen and assessed by Dr.      Malvin Johns.  NG tube was placed initially and had large amount      approximately 500-700 mL out of dark brown stomach contents.  NG      tube was placed.  She was treated with morphine and Zofran.      Overall the patient improved just overnight and this morning,      although still feels bad, thus feel significantly improved.  Goal      at this time is bowel rest and possibly small bowel will improve      without further need for intervention, but will follow along with      surgery for any further opinions and needs.   1. Diabetes.  Secondary to the patient being n.p.o. we will only      sliding scale insulin at this time, no significant Lantus.  We will      continue to monitor her sugars routinely until eating normally.      Last A1c I believe in the upper 6 lower 7 range.   1. Hypertension.  The patient has significant high blood pressure when      she came in possibly secondary to pain.  This is trending down.      Given the fact that she is n.p.o. we will hold her Benicar and      hydrochlorothiazide for now.  Continue her Coreg for heart      protection.  Will not resume amlodipine at this time either.   DISPOSITION:  The patient will continue with NG tube and  bowel rest  and  monitoring her symptoms.  She did have significant improvement in small  bowel findings on acute abdominal series this morning.  We will continue  to follow along as this hopefully resolves.      Catalina Pizza, M.D.  Electronically Signed     ZH/MEDQ  D:  02/23/2008  T:  02/24/2008  Job:  [id number]   cc:   Aggie Moats, Dr.

## 2010-11-26 NOTE — Group Therapy Note (Signed)
[name], [name]                ACCOUNT NO.:  [id number]   MEDICAL RECORD NO.:  [id number]          PATIENT TYPE:  INP   LOCATION:  A330                          FACILITY:  [facility]   PHYSICIAN:  Catalina Pizza, M.D.        DATE OF BIRTH:  Mar 14, 1931   DATE OF PROCEDURE:  02/24/2008  DATE OF DISCHARGE:                                 PROGRESS NOTE   SUBJECTIVE:  [name] is a 77 year old pleasant white female with  acute onset of abdominal pain, I believe, secondary to a small bowel  obstruction, was seen and assessed by Dr. Malvin Johns, did initially get  off approximately 1.5 L after NG tube was placed, has had some flatus  today, and abdominal pain is much improved.  Denies any other pain at  this time, states right lower leg is not causing her any significant  problems at this time.   OBJECTIVE:  VITAL SIGNS:  Temperature is 98.3, blood pressure is 150/73,  pulse 70, respirations and 20.  CBGs, 159, 81, 55, 183, and 96  respectively.  GENERAL:  This is a pale-appearing white female lying bed in no acute  distress.  NG tube in place.  HEENT:  Unremarkable.  LUNGS:  Clear to auscultation bilaterally.  HEART:  Regular rate and rhythm without murmurs, gallops, or rubs.  ABDOMEN:  Soft.  No significant protuberance.  Bowel sounds still  present.  Decreased left lower quadrant pain to palpation.  EXTREMITIES:  Trace lower extremity edema in the right leg.  The patient has  excoriations to the leg and question of secondary type infection to the  skin.   LABORATORY DATA:  CBC shows white count of 6.6, hemoglobin 11.6, and  platelet count 231.  BMET shows sodium 142, potassium 3.8, chloride 112,  CO2 26, glucose 95, BUN 9, creatinine of 0.72, and calcium 8.2.  Acute  abdominal series show decrease in degree of small bowel distention with  very little small bowel air now present.  No free air noted in NG tube  tip and duodenal bulb.   IMPRESSION:  This is a 77 year old white female with a  small bowel  obstruction with resolution.   ASSESSMENT AND PLAN:  1. Small bowel obstruction, seen and assessed by Dr. Malvin Johns and      suggested clamping NG tube at this time, and I will monitor for any      further abdominal distention and vomiting.  At this point, I do not      feel surgery is needed.  I will continue to monitor serial acute      abdominal series on the patient, bowel rest for now, treating with      morphine and Zofran, although the patient has not had any      significant issues after initial bowel.  2. Diabetes.  She has continued to be n.p.o. and we will just treat      with sliding scale insulin for now.  3. Hypertension, mildly elevated, but I will continue to monitor and      not resume her  medicines at this time other than just the Coreg.      4.  Right lower extremity cellulitis.  Question whether some of      this is chronic and related to recent poison ivy infection or      superinfection related to this.  I will continue IV Rocephin for      now, question of some improvement, was bandaged with some Silvadene      and/or Kerlix and I will continue to monitor.   DISPOSITION:  Continue n.p.o. for now, but may, per surgery's input,  advance diet once I feel the patient's small bowel obstruction has  hopefully resolved.      Catalina Pizza, M.D.  Electronically Signed     ZH/MEDQ  D:  02/24/2008  T:  02/24/2008  Job:  [id number]
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
## 2010-11-26 NOTE — Discharge Summary (Signed)
[name]                ACCOUNT NO.:  [id number]   MEDICAL RECORD NO.:  [id number]          PATIENT TYPE:  INP   LOCATION:  A330                          FACILITY:  [facility]   PHYSICIAN:  Mila Homer. Sudie Bailey, M.D.DATE OF BIRTH:  Apr 12, 1931   DATE OF ADMISSION:  02/22/2008  DATE OF DISCHARGE:  08/15/2009LH                               DISCHARGE SUMMARY   This 77 year old woman was admitted to the hospital with a small bowel  obstruction felt to be secondary to adhesions from prior surgery.  She  had a benign 5-day hospitalization extending from February 22, 2008, to  February 26, 2008.  Vital signs remained stable.   Admission white cell count is 12,500 with 75% neutrophils, 17 lymphs,  H&H 13.1 and 39.3, and platelets 297,000.  CMP was normal except for  glucose 153.  UA was negative.  Lipase was 17.  Recheck white cell count  on second day was 11,400, H&H 12.9 and 38.4, and BMP at that time showed  a glucose of 176, BUN 17, and creatinine 0.78.  CEA was normal at 1.9.  Final repeat CBC her third day showed a white cell count 96,600, H&H  11.6 and 34.9, and BNP that day was totally normal.   Her admission CT of the abdomen and pelvis showed diffuse fatty  infiltration of the liver with a question of reflux based upon a  patulous esophagogastric junction and residual contrast material in  distal esophagus.  Dilated fluid-filled small bowel loops in the left  abdomen.  Small bowel obstruction was noted in mid small bowel.  Proximal to her obstruction, small bowel is 4 cm in diameter.  Her acute  abdominal series done the following day showed an improved partial small  bowel obstruction, and this had improved even further by her third and  fourth day.  She still had an NG tube in the proximal stomach on her  fourth day without this removed.   She was admitted to the hospital and was n.p.o. except for ice chips.  She had an IV normal saline 20 mEq per liter 100 mL an hour NG to  continuous suction.  She had morphine oral for pain and was put on  sensitive sliding scale insulin.  Routine was Silvadene cream to the  right lower extremity and Rocephin a gram IV q.24 h.  She is put on  contact precautions, and her Benicar HCT 40/25 and amlodipine 10 mg  daily resumed on her fourth day.  She is also switched from Protonix IV  to Protonix 40 mg p.o. at that time.  Diet was advanced after the NG  tube was removed and she is passing bowel movements and flatus by her  fourth and fifth days.  By her fifth day, she had reached maximum  medical improvement and was ready for discharge home.   DISCHARGE DIAGNOSES:  1. Small bowel obstruction, probably secondary to adhesions from prior      abdominal surgery.  2. Insulin-dependent diabetes.  3. Benign essential hypertension  4. Cellulitis of the right lower extremity.  5. Hyperlipidemia.  She is discharged home on her preadmission meds including Benicar 40/25  daily, Coreg 12.5 mg b.i.d., amlodipine 10 mg daily, and Lantus 74 units  at bedtime.  She is to take Colace 100 mg b.i.d., use Silvadene cream  for the leg 50 g __________ and continue with an antibiotic if present,  and we used Keflex 500 mg q.i.d. (#40, no refills), but followup with  Dr. Margo Aye later this week.       Mila Homer. Sudie Bailey, M.D.  Electronically Signed     SDK/MEDQ  D:  02/26/2008  T:  02/27/2008  Job:  [id number]

## 2010-11-26 NOTE — Group Therapy Note (Signed)
[name], [name]                ACCOUNT NO.:  [id number]   MEDICAL RECORD NO.:  [id number]          PATIENT TYPE:  INP   LOCATION:  A330                          FACILITY:  [facility]   PHYSICIAN:  Catalina Pizza, M.D.        DATE OF BIRTH:  1930/11/24   DATE OF PROCEDURE:  02/25/2008  DATE OF DISCHARGE:                                 PROGRESS NOTE   SUBJECTIVE:  [name] is a 77 year old pleasant white female who  presented with small bowel obstruction I feel secondary to adhesions  from previous abdominal surgery.  She still has an NG tube in place, but  has not had any further nausea or vomiting.  Has kept on clear liquids  last night and this morning without any difficulty.  She has still not  had any bowel movement, but has had flatus.  Denies any aches or pains  at this time.   OBJECTIVE:  VITAL SIGNS:  Temperature is 98.3, blood pressure ranging  from 150/73 up to 189/84, CBGs are 183, 96, 116, 135, and 136  respectively.  GENERAL:  This is an elderly white female, lying in bed, in no acute  distress.  HEENT:  Unremarkable with NG tube in place.  LUNGS:  Clear to auscultation bilaterally.  HEART:  Regular rate and rhythm with no murmurs, gallops, or rubs.  ABDOMEN:  Soft, nontender, and nondistended.  Positive bowel sounds.  EXTREMITIES:  No lower extremity edema.  SKIN:  The erythema on vesicular rash on her right leg is improving.   LABORATORY DATA:  No further laboratory data noted.  Acute abdominal  series showed resolving small bowel obstruction and dilated bowel loops  seen.  No significant change since yesterday.  Lungs are clear.   IMPRESSION:  This is a 77 year old with small bowel obstruction,  resolving at this time.   ASSESSMENT AND PLAN:  1. Small bowel obstruction.  Dr. Malvin Johns will follow along, and the      patient continues to have significant improvement.  She is      advancing diet at this time and is tolerating without any      difficulty.  Still no  bowel movement apparent at this time.      Nasogastric tube is still in place as a case.  2. Diabetes.  Blood sugars have been low.  She has not had anything to      eat at this time, so we will continue with sliding scale insulin      for now.  We will likely need to resume with Lantus once the      patient is eating again.  3. Hypertension.  Blood pressure continued to trend down.  We will      resume her Benicar and amlodipine at this time.  4. Cellulitis right lower extremity, it is likely superinfection on      top of abrasions, again related to poison ivy.  She is on Rocephin,      and erythema and pleuritis have improved significantly.  We will      continue  her current therapy, which is a Silvadene wrap to the      right lower leg.   DISPOSITION:  We will continue until eating adequate diet, able to get  NG tube out, and having her bowel movement before discharge.  The  patient is aware of this and will follow along with Dr. Malvin Johns, and  any further recommendations he is likely to get another acute abdominal  series tomorrow to reassess.      Catalina Pizza, M.D.  Electronically Signed     ZH/MEDQ  D:  02/25/2008  T:  02/26/2008  Job:  [id number]
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
## 2010-11-29 NOTE — Procedures (Signed)
NAMEELLESSE, ANTENUCCI                ACCOUNT NO.:  [id number]   MEDICAL RECORD NO.:  [id number]          PATIENT TYPE:  AMB   LOCATION:  DAY                           FACILITY:  [facility]   PHYSICIAN:  Tchula Bing, M.D. LHCDATE OF BIRTH:  10-19-1930   DATE OF PROCEDURE:  03/12/2005  DATE OF DISCHARGE:                                  ECHOCARDIOGRAM   REFERRING:  Dr. Karilyn Cota and Dr. Renard Matter.   CLINICAL DATA:  A 77 year old woman with murmur, hypertension, diabetes and  colon cancer.   M-MODE:  Aorta 3.1, left atrium 3.5, septum 1.3, posterior wall 1.0, LV  diastole 4.3, LV systole 2.3.   1.  Technically suboptimal but adequate echocardiographic study.  2.  Left atrial size at the upper limit of normal; normal right atrium and      right ventricle.  3.  Very mild aortic annular calcification; normal aortic arch.  4.  Normal aortic, mitral, tricuspid and pulmonic valve; normal proximal      pulmonary artery.  5.  Left ventricular size is normal; mild hypertrophy, most notable in the      proximal septum. Normal regional and global left ventricular systolic      function.  6.  Normal IVC.       Bing, M.D. [facility]  Electronically Signed     RR/MEDQ  D:  03/12/2005  T:  03/12/2005  Job:  [id number]

## 2010-12-10 ENCOUNTER — Other Ambulatory Visit (HOSPITAL_COMMUNITY): Payer: Self-pay

## 2010-12-10 ENCOUNTER — Encounter (HOSPITAL_COMMUNITY): Payer: Medicare Other | Admitting: Oncology

## 2011-04-07 LAB — COMPREHENSIVE METABOLIC PANEL
ALT: 21
AST: 22
Albumin: 3.3 — ABNORMAL LOW
Alkaline Phosphatase: 52
BUN: 26 — ABNORMAL HIGH
Chloride: 108
GFR calc Af Amer: >60
Potassium: 4.3
Sodium: 139
Total Bilirubin: 0.5
Total Protein: 7.1

## 2011-04-07 LAB — CEA: CEA: 2.3

## 2011-04-07 LAB — CBC
MCV: 85.9
Platelets: 255
RBC: 4.11
WBC: 7.4

## 2011-04-11 LAB — DIFFERENTIAL
Basophils Absolute: 0
Basophils Relative: 0
Basophils Relative: 0
Lymphocytes Relative: 17
Lymphocytes Relative: 26
Lymphocytes Relative: 9 — ABNORMAL LOW
Lymphs Abs: 1
Monocytes Absolute: 0.6
Monocytes Relative: 10
Monocytes Relative: 7
Neutro Abs: 3.9
Neutro Abs: 9.4 — ABNORMAL HIGH
Neutro Abs: 9.5 — ABNORMAL HIGH
Neutrophils Relative %: 75
Neutrophils Relative %: 84 — ABNORMAL HIGH

## 2011-04-11 LAB — COMPREHENSIVE METABOLIC PANEL
Alkaline Phosphatase: 56
BUN: 18
Chloride: 110
Creatinine, Ser: 0.81
Glucose, Bld: 153 — ABNORMAL HIGH
Potassium: 3.5
Total Bilirubin: 0.8

## 2011-04-11 LAB — CBC
HCT: 39.3
Hemoglobin: 11.6 — ABNORMAL LOW
Hemoglobin: 12.9
Hemoglobin: 13.1
MCV: 88
Platelets: 297
RBC: 3.92
RBC: 4.3
RDW: 13.5
RDW: 13.9
WBC: 11.4 — ABNORMAL HIGH
WBC: 12.5 — ABNORMAL HIGH

## 2011-04-11 LAB — GLUCOSE, CAPILLARY
Glucose-Capillary: 118 — ABNORMAL HIGH
Glucose-Capillary: 134 — ABNORMAL HIGH
Glucose-Capillary: 136 — ABNORMAL HIGH
Glucose-Capillary: 168 — ABNORMAL HIGH
Glucose-Capillary: 55 — ABNORMAL LOW
Glucose-Capillary: 96

## 2011-04-11 LAB — BASIC METABOLIC PANEL
CO2: 26
Calcium: 8.2 — ABNORMAL LOW
Calcium: 8.6
GFR calc Af Amer: >60
GFR calc Af Amer: >60
GFR calc non Af Amer: >60
GFR calc non Af Amer: >60
Potassium: 4.2
Sodium: 138
Sodium: 142

## 2011-04-11 LAB — URINALYSIS, ROUTINE W REFLEX MICROSCOPIC
Glucose, UA: NEGATIVE
Hgb urine dipstick: NEGATIVE
Ketones, ur: NEGATIVE
Protein, ur: NEGATIVE
pH: 7.5

## 2011-04-11 LAB — LIPASE, BLOOD: Lipase: 17

## 2011-04-14 LAB — DIFFERENTIAL
Basophils Relative: 1
Eosinophils Absolute: 0.4
Lymphs Abs: 2.2
Neutro Abs: 4.2
Neutrophils Relative %: 57

## 2011-04-14 LAB — COMPREHENSIVE METABOLIC PANEL
ALT: 20
Alkaline Phosphatase: 45
BUN: 16
CO2: 22
Calcium: 9.2
GFR calc non Af Amer: >60
Glucose, Bld: 154 — ABNORMAL HIGH
Sodium: 136

## 2011-04-14 LAB — CBC
HCT: 37.8
Hemoglobin: 12.9
MCV: 88.8
RDW: 13.6

## 2011-04-15 LAB — CEA: CEA: 2.2

## 2011-04-18 LAB — CEA: CEA: 1.7

## 2011-04-30 LAB — COMPREHENSIVE METABOLIC PANEL
BUN: 38 — ABNORMAL HIGH
CO2: 25
Calcium: 9.1
Creatinine, Ser: 0.8
GFR calc non Af Amer: >60
Glucose, Bld: 67 — ABNORMAL LOW

## 2011-04-30 LAB — DIFFERENTIAL
Eosinophils Absolute: 0.1
Lymphocytes Relative: 16
Lymphs Abs: 2.2
Neutro Abs: 10.7 — ABNORMAL HIGH
Neutrophils Relative %: 76

## 2011-04-30 LAB — CBC
Hemoglobin: 12.1
MCHC: 35.2
MCV: 86.5
RBC: 3.97

## 2011-04-30 LAB — CEA: CEA: 2.1

## 2011-05-12 ENCOUNTER — Other Ambulatory Visit (HOSPITAL_COMMUNITY): Payer: Self-pay

## 2011-05-13 ENCOUNTER — Ambulatory Visit (HOSPITAL_COMMUNITY): Payer: Self-pay | Admitting: Oncology

## 2011-06-19 ENCOUNTER — Encounter (HOSPITAL_BASED_OUTPATIENT_CLINIC_OR_DEPARTMENT_OTHER): Payer: Medicare Other | Attending: Internal Medicine

## 2011-06-19 DIAGNOSIS — Z79899 Other long term (current) drug therapy: Secondary | ICD-10-CM | POA: Insufficient documentation

## 2011-06-19 DIAGNOSIS — Z794 Long term (current) use of insulin: Secondary | ICD-10-CM | POA: Insufficient documentation

## 2011-06-19 DIAGNOSIS — E119 Type 2 diabetes mellitus without complications: Secondary | ICD-10-CM | POA: Insufficient documentation

## 2011-06-19 DIAGNOSIS — Z85038 Personal history of other malignant neoplasm of large intestine: Secondary | ICD-10-CM | POA: Insufficient documentation

## 2011-06-19 DIAGNOSIS — I10 Essential (primary) hypertension: Secondary | ICD-10-CM | POA: Insufficient documentation

## 2011-06-19 DIAGNOSIS — Z7982 Long term (current) use of aspirin: Secondary | ICD-10-CM | POA: Insufficient documentation

## 2011-06-19 DIAGNOSIS — Z90710 Acquired absence of both cervix and uterus: Secondary | ICD-10-CM | POA: Insufficient documentation

## 2011-06-19 DIAGNOSIS — R21 Rash and other nonspecific skin eruption: Secondary | ICD-10-CM | POA: Insufficient documentation

## 2011-06-19 DIAGNOSIS — L259 Unspecified contact dermatitis, unspecified cause: Secondary | ICD-10-CM | POA: Insufficient documentation
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
## 2011-07-17 ENCOUNTER — Encounter (HOSPITAL_BASED_OUTPATIENT_CLINIC_OR_DEPARTMENT_OTHER): Payer: Medicare Other | Attending: Internal Medicine

## 2011-07-17 DIAGNOSIS — R21 Rash and other nonspecific skin eruption: Secondary | ICD-10-CM | POA: Insufficient documentation

## 2011-07-17 DIAGNOSIS — I10 Essential (primary) hypertension: Secondary | ICD-10-CM | POA: Insufficient documentation

## 2011-07-17 DIAGNOSIS — E119 Type 2 diabetes mellitus without complications: Secondary | ICD-10-CM | POA: Insufficient documentation

## 2011-07-17 DIAGNOSIS — Z85038 Personal history of other malignant neoplasm of large intestine: Secondary | ICD-10-CM | POA: Insufficient documentation

## 2011-07-17 DIAGNOSIS — Z79899 Other long term (current) drug therapy: Secondary | ICD-10-CM | POA: Insufficient documentation

## 2011-07-17 DIAGNOSIS — Z90710 Acquired absence of both cervix and uterus: Secondary | ICD-10-CM | POA: Insufficient documentation

## 2011-07-17 DIAGNOSIS — L259 Unspecified contact dermatitis, unspecified cause: Secondary | ICD-10-CM | POA: Insufficient documentation

## 2011-07-17 DIAGNOSIS — Z7982 Long term (current) use of aspirin: Secondary | ICD-10-CM | POA: Insufficient documentation

## 2011-07-17 DIAGNOSIS — Z794 Long term (current) use of insulin: Secondary | ICD-10-CM | POA: Insufficient documentation

## 2012-01-13 ENCOUNTER — Other Ambulatory Visit: Payer: Self-pay | Admitting: Emergency Medicine

## 2012-07-08 NOTE — Progress Notes (Signed)
Prior auth form had been faxed to [facility] for pt's hydroxyzine 10 mg QD, and approval was received. Faxed notice to [facility] [place].

## 2012-12-03 ENCOUNTER — Other Ambulatory Visit (HOSPITAL_COMMUNITY): Payer: Self-pay | Admitting: Internal Medicine

## 2012-12-03 ENCOUNTER — Ambulatory Visit (HOSPITAL_COMMUNITY)
Admission: RE | Admit: 2012-12-03 | Discharge: 2012-12-03 | Disposition: A | Discharge status: Home / Self Care | Payer: Medicare Other | Source: Ambulatory Visit | Attending: Internal Medicine | Admitting: Internal Medicine

## 2012-12-03 DIAGNOSIS — Z85038 Personal history of other malignant neoplasm of large intestine: Secondary | ICD-10-CM | POA: Insufficient documentation

## 2012-12-03 DIAGNOSIS — R1031 Right lower quadrant pain: Secondary | ICD-10-CM | POA: Insufficient documentation

## 2012-12-03 DIAGNOSIS — K5732 Diverticulitis of large intestine without perforation or abscess without bleeding: Secondary | ICD-10-CM | POA: Insufficient documentation

## 2013-02-05 ENCOUNTER — Emergency Department (HOSPITAL_COMMUNITY)
Admission: EM | Admit: 2013-02-05 | Discharge: 2013-02-05 | Disposition: A | Discharge status: Home / Self Care | Payer: Medicare Other | Attending: Emergency Medicine | Admitting: Emergency Medicine

## 2013-02-05 ENCOUNTER — Encounter (HOSPITAL_COMMUNITY): Payer: Self-pay

## 2013-02-05 ENCOUNTER — Emergency Department (HOSPITAL_COMMUNITY): Payer: Medicare Other

## 2013-02-05 DIAGNOSIS — R109 Unspecified abdominal pain: Secondary | ICD-10-CM | POA: Insufficient documentation

## 2013-02-05 DIAGNOSIS — I10 Essential (primary) hypertension: Secondary | ICD-10-CM | POA: Insufficient documentation

## 2013-02-05 DIAGNOSIS — E119 Type 2 diabetes mellitus without complications: Secondary | ICD-10-CM | POA: Insufficient documentation

## 2013-02-05 DIAGNOSIS — Z85038 Personal history of other malignant neoplasm of large intestine: Secondary | ICD-10-CM | POA: Insufficient documentation

## 2013-02-05 DIAGNOSIS — Z794 Long term (current) use of insulin: Secondary | ICD-10-CM | POA: Insufficient documentation

## 2013-02-05 DIAGNOSIS — Z8719 Personal history of other diseases of the digestive system: Secondary | ICD-10-CM | POA: Insufficient documentation

## 2013-02-05 DIAGNOSIS — Z79899 Other long term (current) drug therapy: Secondary | ICD-10-CM | POA: Insufficient documentation

## 2013-02-05 DIAGNOSIS — R1110 Vomiting, unspecified: Secondary | ICD-10-CM | POA: Insufficient documentation

## 2013-02-05 HISTORY — DX: Essential (primary) hypertension: I10

## 2013-02-05 HISTORY — DX: Diverticulitis of intestine, part unspecified, without perforation or abscess without bleeding: K57.92

## 2013-02-05 HISTORY — DX: Malignant (primary) neoplasm, unspecified: C80.1

## 2013-02-05 HISTORY — DX: Type 2 diabetes mellitus without complications: E11.9

## 2013-02-05 LAB — BASIC METABOLIC PANEL
CO2: 25 mEq/L (ref 19–32)
Chloride: 101 mEq/L (ref 96–112)
Creatinine, Ser: 0.71 mg/dL (ref 0.50–1.10)
Sodium: 138 mEq/L (ref 135–145)

## 2013-02-05 LAB — CBC WITH DIFFERENTIAL/PLATELET
Basophils Absolute: 0 10*3/uL (ref 0.0–0.1)
HCT: 40.8 % (ref 36.0–46.0)
Hemoglobin: 14.5 g/dL (ref 12.0–15.0)
Lymphocytes Relative: 8 % — ABNORMAL LOW (ref 12–46)
Monocytes Absolute: 0.4 10*3/uL (ref 0.1–1.0)
Neutro Abs: 12.9 10*3/uL — ABNORMAL HIGH (ref 1.7–7.7)
RDW: 13.1 % (ref 11.5–15.5)
WBC: 14.6 10*3/uL — ABNORMAL HIGH (ref 4.0–10.5)

## 2013-02-05 MED ORDER — ONDANSETRON HCL 4 MG/2ML IJ SOLN
4.0000 mg | Freq: Once | INTRAMUSCULAR | Status: AC
  Administered 2013-02-05: 4 mg via INTRAVENOUS
  Filled 2013-02-05: qty 2

## 2013-02-05 MED ORDER — MORPHINE SULFATE 4 MG/ML IJ SOLN
2.0000 mg | Freq: Once | INTRAMUSCULAR | Status: AC
  Administered 2013-02-05: 2 mg via INTRAVENOUS
  Filled 2013-02-05: qty 1

## 2013-02-05 MED ORDER — METRONIDAZOLE 500 MG PO TABS: 500.0000 mg | ORAL_TABLET | Freq: Two times a day (BID) | ORAL | Status: DC

## 2013-02-05 MED ORDER — SODIUM CHLORIDE 0.9 % IV SOLN
Freq: Once | INTRAVENOUS | Status: AC
  Administered 2013-02-05: 02:00:00 via INTRAVENOUS

## 2013-02-05 MED ORDER — HYDROCODONE-ACETAMINOPHEN 5-325 MG PO TABS: 1.0000 | ORAL_TABLET | ORAL | Status: DC | PRN

## 2013-02-05 MED ORDER — ONDANSETRON 4 MG PO TBDP: 4.0000 mg | ORAL_TABLET | Freq: Three times a day (TID) | ORAL | Status: DC | PRN

## 2013-02-05 NOTE — ED Provider Notes (Signed)
CSN: [id number]     Arrival date & time 02/05/13  0029 History     First MD Initiated Contact with Patient 02/05/13 0130     Chief Complaint  Patient presents with  . Abdominal Pain  . Emesis   (Consider location/radiation/quality/duration/timing/severity/associated sxs/prior Treatment) HPI HPI Comments: [name] is a 79 y.o. female who presents to the Emergency Department complaining of left lower abdominal pain that began earlier this evening. It has been associated with vomiting since arrival in the ER. Pain is sharp in character. She had a BM at 1045 PM which was normal.She is currently not in pain but the pain is intermittent.   PCP Dr. Margo Aye  Past Medical History  Diagnosis Date  . Diabetes mellitus without complication   . Hypertension   . Diverticulitis   . Cancer 2006    colon   Past Surgical History  Procedure Laterality Date  . Abdominal hysterectomy    . Appendectomy    . Colon surgery     No family history on file. History  Substance Use Topics  . Smoking status: Never Smoker   . Smokeless tobacco: Not on file  . Alcohol Use: No   OB History   Grav Para Term Preterm Abortions TAB SAB Ect Mult Living                 Review of Systems  Constitutional: Negative for fever.       10 Systems reviewed and are negative for acute change except as noted in the HPI.  HENT: Negative for congestion.   Eyes: Negative for discharge and redness.  Respiratory: Negative for cough and shortness of breath.   Cardiovascular: Negative for chest pain.  Gastrointestinal: Positive for vomiting and abdominal pain.  Musculoskeletal: Negative for back pain.  Skin: Negative for rash.  Neurological: Negative for syncope, numbness and headaches.  Psychiatric/Behavioral:       No behavior change.    Allergies  Review of patient's allergies indicates no known allergies.  Home Medications   Current Outpatient Rx  Name  Route  Sig  Dispense  Refill  . insulin glargine  (LANTUS) 100 UNIT/ML injection   Subcutaneous   Inject 74 Units into the skin at bedtime.         Marland Kitchen labetalol (NORMODYNE) 200 MG tablet   Oral   Take 200 mg by mouth 2 (two) times daily.         Marland Kitchen olmesartan-hydrochlorothiazide (BENICAR HCT) 40-25 MG per tablet   Oral   Take 1 tablet by mouth daily.         . pravastatin (PRAVACHOL) 40 MG tablet   Oral   Take 40 mg by mouth daily.         . sertraline (ZOLOFT) 25 MG tablet   Oral   Take 25 mg by mouth daily.         Marland Kitchen triamcinolone cream (KENALOG) 0.1 %      APPLY TWICE DAILY   480 g   0    BP 207/74  Pulse 82  Temp(Src) 97.9 F (36.6 C) (Oral)  Resp 20  Ht 5' 4.5" (1.638 m)  Wt 168 lb (76.204 kg)  BMI 28.4 kg/m2  SpO2 97% Physical Exam  Nursing note and vitals reviewed. Constitutional: She appears well-developed and well-nourished.  Awake, alert, nontoxic appearance.  HENT:  Head: Normocephalic and atraumatic.  Eyes: EOM are normal. Pupils are equal, round, and reactive to light.  Neck: Normal range  of motion. Neck supple.  Cardiovascular: Normal rate and intact distal pulses.   Pulmonary/Chest: Effort normal and breath sounds normal. She exhibits no tenderness.  Abdominal: Soft. Bowel sounds are normal. There is no tenderness. There is no rebound and no guarding.  Musculoskeletal: She exhibits no tenderness.  Baseline ROM, no obvious new focal weakness.  Neurological:  Mental status and motor strength appears baseline for patient and situation.  Skin: No rash noted.  Psychiatric: She has a normal mood and affect.    ED Course   Procedures (including critical care time) Results for orders placed during the hospital encounter of 02/05/13  CBC WITH DIFFERENTIAL      Result Value Range   WBC 14.6 (*) 4.0 - 10.5 K/uL   RBC 4.78  3.87 - 5.11 MIL/uL   Hemoglobin 14.5  12.0 - 15.0 g/dL   HCT 16.1  09.6 - 04.5 %   MCV 85.4  78.0 - 100.0 fL   MCH 30.3  26.0 - 34.0 pg   MCHC 35.5  30.0 - 36.0 g/dL    RDW 40.9  81.1 - 91.4 %   Platelets 287  150 - 400 K/uL   Neutrophils Relative % 89 (*) 43 - 77 %   Neutro Abs 12.9 (*) 1.7 - 7.7 K/uL   Lymphocytes Relative 8 (*) 12 - 46 %   Lymphs Abs 1.2  0.7 - 4.0 K/uL   Monocytes Relative 3  3 - 12 %   Monocytes Absolute 0.4  0.1 - 1.0 K/uL   Eosinophils Relative 0  0 - 5 %   Eosinophils Absolute 0.0  0.0 - 0.7 K/uL   Basophils Relative 0  0 - 1 %   Basophils Absolute 0.0  0.0 - 0.1 K/uL  BASIC METABOLIC PANEL      Result Value Range   Sodium 138  135 - 145 mEq/L   Potassium 4.2  3.5 - 5.1 mEq/L   Chloride 101  96 - 112 mEq/L   CO2 25  19 - 32 mEq/L   Glucose, Bld 263 (*) 70 - 99 mg/dL   BUN 20  6 - 23 mg/dL   Creatinine, Ser 7.82  0.50 - 1.10 mg/dL   Calcium 9.5  8.4 - 95.6 mg/dL   GFR calc non Af Amer 79 (*) >90 mL/min   GFR calc Af Amer >90  >90 mL/min    Dg Abd Acute W/chest  02/05/2013   *RADIOLOGY REPORT*  Clinical Data: Left lower abdominal pain since 05:00 p.m. tonight. History of colon cancer.  ACUTE ABDOMEN SERIES (ABDOMEN 2 VIEW & CHEST 1 VIEW)  Comparison: 04/16/2011  Findings: Shallow inspiration.  Normal heart size and pulmonary vascularity.  Increased density in the left lung bases probably due to vascular crowding.  No focal consolidation or airspace disease. No blunting of costophrenic angles.  No pneumothorax.  Mediastinal contours appear intact.  Tortuous and calcified aorta. Degenerative changes in the spine and shoulders.  No significant change since previous study.  Gas and stool throughout the colon.  There appear to be mildly distended gas filled loops of  bowel in the left abdomen.  There is apparent bowel wall thickening.  This could involve small bowel or descending and transverse colon.  Inflammatory or infectious infiltration or edema are suggested.  No free intra-abdominal air. No abnormal air fluid levels.  Postoperative changes in the pelvis. Degenerative changes in the lumbar spine and hips.  No radiopaque stones  identified.  IMPRESSION: Nonspecific gas filled  and thickened bowel loops demonstrated in the left abdomen.  This could represent small bowel or colon. Changes suggest inflammatory or infectious infiltration or edema.   Original Report Authenticated By: Burman Nieves, M.D.   Medications  0.9 %  sodium chloride infusion ( Intravenous New Bag/Given 02/05/13 0152)  ondansetron (ZOFRAN) injection 4 mg (4 mg Intravenous Given 02/05/13 0152)  morphine 4 MG/ML injection 2 mg (2 mg Intravenous Given 02/05/13 0152)    MDM  Patient with lower abdominal pain that is intermittent in the left lower quadrant. Xray shows what may be inflammatory process. Will treat as such with antibiotics and pain medicines. Reviewed the results with the patient. Pt stable in ED with no significant deterioration in condition.The patient appears reasonably screened and/or stabilized for discharge and I doubt any other medical condition or other [facility] requiring further screening, evaluation, or treatment in the ED at this time prior to discharge.  MDM Reviewed: nursing note and vitals Interpretation: labs and x-ray     Nicoletta Dress. Colon Hodge, MD 02/05/13 [phone]

## 2013-02-05 NOTE — ED Notes (Signed)
Abdominal pain, nausea and vomiting per pt.

## 2014-08-02 DIAGNOSIS — I10 Essential (primary) hypertension: Secondary | ICD-10-CM | POA: Diagnosis not present

## 2014-08-02 DIAGNOSIS — E119 Type 2 diabetes mellitus without complications: Secondary | ICD-10-CM | POA: Diagnosis not present

## 2014-08-18 DIAGNOSIS — E782 Mixed hyperlipidemia: Secondary | ICD-10-CM | POA: Diagnosis not present

## 2014-08-18 DIAGNOSIS — D509 Iron deficiency anemia, unspecified: Secondary | ICD-10-CM | POA: Diagnosis not present

## 2014-08-18 DIAGNOSIS — E1165 Type 2 diabetes mellitus with hyperglycemia: Secondary | ICD-10-CM | POA: Diagnosis not present

## 2014-08-18 DIAGNOSIS — I10 Essential (primary) hypertension: Secondary | ICD-10-CM | POA: Diagnosis not present

## 2014-09-18 DIAGNOSIS — Z6829 Body mass index (BMI) 29.0-29.9, adult: Secondary | ICD-10-CM | POA: Diagnosis not present

## 2014-09-18 DIAGNOSIS — E1165 Type 2 diabetes mellitus with hyperglycemia: Secondary | ICD-10-CM | POA: Diagnosis not present

## 2014-09-18 DIAGNOSIS — I10 Essential (primary) hypertension: Secondary | ICD-10-CM | POA: Diagnosis not present

## 2014-11-13 DIAGNOSIS — I10 Essential (primary) hypertension: Secondary | ICD-10-CM | POA: Diagnosis not present

## 2014-11-13 DIAGNOSIS — E119 Type 2 diabetes mellitus without complications: Secondary | ICD-10-CM | POA: Diagnosis not present

## 2014-11-13 DIAGNOSIS — E782 Mixed hyperlipidemia: Secondary | ICD-10-CM | POA: Diagnosis not present

## 2014-11-22 DIAGNOSIS — E782 Mixed hyperlipidemia: Secondary | ICD-10-CM | POA: Diagnosis not present

## 2014-11-22 DIAGNOSIS — I10 Essential (primary) hypertension: Secondary | ICD-10-CM | POA: Diagnosis not present

## 2014-11-22 DIAGNOSIS — E1165 Type 2 diabetes mellitus with hyperglycemia: Secondary | ICD-10-CM | POA: Diagnosis not present

## 2014-11-22 DIAGNOSIS — D509 Iron deficiency anemia, unspecified: Secondary | ICD-10-CM | POA: Diagnosis not present

## 2014-12-11 ENCOUNTER — Emergency Department (HOSPITAL_COMMUNITY)
Admission: EM | Admit: 2014-12-11 | Discharge: 2014-12-11 | Disposition: A | Discharge status: Home / Self Care | Payer: Medicare Other | Source: Home / Self Care

## 2014-12-11 ENCOUNTER — Encounter (HOSPITAL_COMMUNITY): Payer: Self-pay | Admitting: *Deleted

## 2014-12-11 DIAGNOSIS — R21 Rash and other nonspecific skin eruption: Secondary | ICD-10-CM | POA: Diagnosis not present

## 2014-12-11 MED ORDER — METHYLPREDNISOLONE ACETATE 80 MG/ML IJ SUSP
INTRAMUSCULAR | Status: AC
  Filled 2014-12-11: qty 1

## 2014-12-11 MED ORDER — METHYLPREDNISOLONE ACETATE 80 MG/ML IJ SUSP
80.0000 mg | Freq: Once | INTRAMUSCULAR | Status: AC
  Administered 2014-12-11: 80 mg via INTRAMUSCULAR

## 2014-12-11 MED ORDER — PREDNISONE 20 MG PO TABS: ORAL_TABLET | ORAL | Status: DC

## 2014-12-11 NOTE — ED Notes (Signed)
Pt    Reports     Rash  X  3   Weeks    Getting  Worse   And  Spreading    She  Is   Awake     And  Alert      Oriented          Symptoms   Nort  releived  By   otc  meds

## 2014-12-11 NOTE — ED Provider Notes (Signed)
CSN: [id number]     Arrival date & time 12/11/14  1741 History   None    Chief Complaint  Patient presents with  . Rash   (Consider location/radiation/quality/duration/timing/severity/associated sxs/prior Treatment) HPI She is an 81 year old woman here for evaluation of rash. She states this started about 2 weeks ago on her right upper arm. It is itchy, particularly when she gets warm. It has spread to involve bilateral upper arms, chest, neck, stomach, and back. She has tried putting lotion on it, but that made it sting.  She states the rash on the upper right arm was present when her doctor started her on amlodipine, but it was not until them that it started spreading. She denies any fevers, nausea, vomiting. No change in her urination. She denies any new soaps or lotions. She states she is quite allergic to poison ivy, but denies any exposure.  Past Medical History  Diagnosis Date  . Diabetes mellitus without complication   . Hypertension   . Diverticulitis   . Cancer 2006    colon   Past Surgical History  Procedure Laterality Date  . Abdominal hysterectomy    . Appendectomy    . Colon surgery     History reviewed. No pertinent family history. History  Substance Use Topics  . Smoking status: Never Smoker   . Smokeless tobacco: Not on file  . Alcohol Use: No   OB History    No data available     Review of Systems As in history of present illness Allergies  Review of patient's allergies indicates no known allergies.  Home Medications   Prior to Admission medications   Medication Sig Start Date End Date Taking? Authorizing Provider  HYDROcodone-acetaminophen (NORCO/VICODIN) 5-325 MG per tablet Take 1 tablet by mouth every 4 (four) hours as needed for pain. 02/05/13   Prentiss Bells, MD  insulin glargine (LANTUS) 100 UNIT/ML injection Inject 74 Units into the skin at bedtime.    Historical Provider, MD  labetalol (NORMODYNE) 200 MG tablet Take 200 mg by mouth 2 (two)  times daily.    Historical Provider, MD  metroNIDAZOLE (FLAGYL) 500 MG tablet Take 1 tablet (500 mg total) by mouth 2 (two) times daily. 02/05/13   Prentiss Bells, MD  olmesartan-hydrochlorothiazide (BENICAR HCT) 40-25 MG per tablet Take 1 tablet by mouth daily.    Historical Provider, MD  ondansetron (ZOFRAN ODT) 4 MG disintegrating tablet Take 1 tablet (4 mg total) by mouth every 8 (eight) hours as needed for nausea. 02/05/13   Prentiss Bells, MD  pravastatin (PRAVACHOL) 40 MG tablet Take 40 mg by mouth daily.    Historical Provider, MD  predniSONE (DELTASONE) 20 MG tablet Take 3 pills daily for 5 days, then 2 pills for 3 days, then 1 pill for 3 days. 12/11/14   Melony Overly, MD  sertraline (ZOLOFT) 25 MG tablet Take 25 mg by mouth daily.    Historical Provider, MD  triamcinolone cream (KENALOG) 0.1 % APPLY TWICE DAILY 01/13/12   Areta Haber Dunn, PA-C   BP 224/98 mmHg  Pulse 85  Temp(Src) 98.4 F (36.9 C) (Oral)  Resp 20  SpO2 98% Physical Exam  Constitutional: She is oriented to person, place, and time. She appears well-developed and well-nourished. No distress.  Cardiovascular: Normal rate.   Pulmonary/Chest: Effort normal.  Neurological: She is alert and oriented to person, place, and time.  Skin:  Erythematous papules and plaques on bilateral upper extremities, chest, back, neck. There are  overlying excoriations. She also has some erythematous macules. Her right upper arm has confluent lesions.    ED Course  Procedures (including critical care time) Labs Review Labs Reviewed - No data to display  Imaging Review No results found.   MDM   1. Rash    Unclear etiology. I am concerned about possible drug reaction to amlodipine. Time course and presentation is not consistent with shingles or poison ivy. Depo-Medrol 80 mg IM given. Her blood pressure is quite elevated. However, she denies chest pain, shortness of breath, headache, vision changes. She will follow-up with her PCP. We'll  treat with a prednisone taper. Follow-up with PCP in one week for recheck.    Melony Overly, MD 12/11/14 2007

## 2014-12-11 NOTE — Discharge Instructions (Signed)
It looks like you are reacting to something. Please stop the amlodipine for the next 2 weeks. Take prednisone as prescribed. You can use Benadryl for itching. Do not use this more than 3 times a day. Please follow-up with your PCP in 1 week for a recheck.

## 2015-01-25 DIAGNOSIS — L309 Dermatitis, unspecified: Secondary | ICD-10-CM | POA: Diagnosis not present

## 2015-03-17 ENCOUNTER — Ambulatory Visit (INDEPENDENT_AMBULATORY_CARE_PROVIDER_SITE_OTHER): Payer: Medicare Other | Admitting: Family Medicine

## 2015-03-17 VITALS — BP 130/60 | HR 72 | Temp 97.4°F | Resp 28 | Ht 62.75 in | Wt 179.4 lb

## 2015-03-17 DIAGNOSIS — C189 Malignant neoplasm of colon, unspecified: Secondary | ICD-10-CM | POA: Diagnosis not present

## 2015-03-17 DIAGNOSIS — R21 Rash and other nonspecific skin eruption: Secondary | ICD-10-CM | POA: Diagnosis not present

## 2015-03-17 DIAGNOSIS — E11620 Type 2 diabetes mellitus with diabetic dermatitis: Secondary | ICD-10-CM

## 2015-03-17 DIAGNOSIS — D649 Anemia, unspecified: Secondary | ICD-10-CM | POA: Diagnosis not present

## 2015-03-17 DIAGNOSIS — E119 Type 2 diabetes mellitus without complications: Secondary | ICD-10-CM | POA: Diagnosis not present

## 2015-03-17 LAB — GLUCOSE, POCT (MANUAL RESULT ENTRY): POC Glucose: 343 mg/dl — AB (ref 70–99)

## 2015-03-17 LAB — POCT CBC
Granulocyte percent: 71.9 % (ref 37–80)
HCT, POC: 38 % (ref 37.7–47.9)
Hemoglobin: 11.7 g/dL — AB (ref 12.2–16.2)
Lymph, poc: 1.8 (ref 0.6–3.4)
MCH, POC: 27.4 pg (ref 27–31.2)
MCHC: 30.9 g/dL — AB (ref 31.8–35.4)
MCV: 88.8 fL (ref 80–97)
MID (cbc): 0.8 (ref 0–0.9)
MPV: 7.7 fL (ref 0–99.8)
POC Granulocyte: 6.6 (ref 2–6.9)
POC LYMPH PERCENT: 19.6 % (ref 10–50)
POC MID %: 8.5 %M (ref 0–12)
Platelet Count, POC: 338 10*3/uL (ref 142–424)
RBC: 4.29 M/uL (ref 4.04–5.48)
RDW, POC: 16.4 %
WBC: 9.2 10*3/uL (ref 4.6–10.2)

## 2015-03-17 LAB — COMPLETE METABOLIC PANEL WITH GFR
ALT: 15 U/L (ref 6–29)
AST: 15 U/L (ref 10–35)
Albumin: 3.3 g/dL — ABNORMAL LOW (ref 3.6–5.1)
Alkaline Phosphatase: 57 U/L (ref 33–130)
BUN: 24 mg/dL (ref 7–25)
Calcium: 8.4 mg/dL — ABNORMAL LOW (ref 8.6–10.4)
Creat: 1.13 mg/dL — ABNORMAL HIGH (ref 0.60–0.88)
GFR, Est African American: 52 mL/min — ABNORMAL LOW (ref >=60)
Glucose, Bld: 334 mg/dL — ABNORMAL HIGH (ref 65–99)
Potassium: 5 mmol/L (ref 3.5–5.3)
Sodium: 137 mmol/L (ref 135–146)

## 2015-03-17 LAB — COMPLETE METABOLIC PANEL WITHOUT GFR
CO2: 23 mmol/L (ref 20–31)
Chloride: 106 mmol/L (ref 98–110)
GFR, Est Non African American: 45 mL/min — ABNORMAL LOW (ref >=60)
Total Bilirubin: 0.3 mg/dL (ref 0.2–1.2)
Total Protein: 6.4 g/dL (ref 6.1–8.1)

## 2015-03-17 LAB — POCT GLYCOSYLATED HEMOGLOBIN (HGB A1C): Hemoglobin A1C: 10

## 2015-03-17 LAB — POCT SKIN KOH: Skin KOH, POC: NEGATIVE

## 2015-03-17 MED ORDER — HYDROXYZINE PAMOATE 25 MG PO CAPS: 25.0000 mg | ORAL_CAPSULE | Freq: Three times a day (TID) | ORAL | Status: DC | PRN

## 2015-03-17 MED ORDER — CLOBETASOL PROPIONATE 0.05 % EX CREA: 1.0000 "application " | TOPICAL_CREAM | Freq: Two times a day (BID) | CUTANEOUS | Status: DC | PRN

## 2015-03-17 NOTE — Progress Notes (Signed)
Chief Complaint:  Chief Complaint  Patient presents with  . Rash    C/o rash on upper body for a few months-worse since last treatment  . Flu Vaccine    HPI: [name] is a 81 y.o. female who reports to [facility] today complaining of  Rash for several months and has gotten worse, she was given streoid injection and also taper but the rash came back after afew weeks and now she has it worse. She doe snot think it is from any posion ivy expsoure but not sure.  Denies any  new medicines, no new soaps, no new travels, no food allergies.  Needs to refer to dermatologist.  Has steroid injection. Tolerated steroid injection ok.  But she has diabetes and sttes her diabetes is well controlled, takes insulin daily, cannot tell me why she is not on metformin or any oral meds.  She has not had any colon cancer, last colonscopy was >5 years ago but was normal She has not had any URSI sxs, ear pain, n/v/abd pain , cp or SOB, throat pain, or any increase thirst or urination issue    Lab Results  Component Value Date   HGBA1C 10.0 03/17/2015   Lab Results  Component Value Date   CREATININE 0.71 02/05/2013     Past Medical History  Diagnosis Date  . Diabetes mellitus without complication   . Hypertension   . Diverticulitis   . Cancer 2006    colon  . Anxiety   . Arthritis   . Cataract    Past Surgical History  Procedure Laterality Date  . Abdominal hysterectomy    . Appendectomy    . Colon surgery     Social History   Social History  . Marital Status: Widowed    Spouse Name: N/A  . Number of Children: N/A  . Years of Education: N/A   Social History Main Topics  . Smoking status: Never Smoker   . Smokeless tobacco: None  . Alcohol Use: No  . Drug Use: No  . Sexual Activity: Not Asked   Other Topics Concern  . None   Social History Narrative   Family History  Problem Relation Age of Onset  . Stroke Mother   . Diabetes Father   . Cancer Sister   . Diabetes  Sister   . Cancer Brother   . Cancer Brother   . Diabetes Sister   . Stroke Sister    No Known Allergies Prior to Admission medications   Medication Sig Start Date End Date Taking? Authorizing Provider  ALPRAZolam (XANAX) 0.25 MG tablet Take 0.25 mg by mouth 2 (two) times daily as needed for anxiety.   Yes Historical Provider, MD  aspirin 81 MG tablet Take 81 mg by mouth daily.   Yes Historical Provider, MD  insulin glargine (LANTUS) 100 UNIT/ML injection Inject 74 Units into the skin at bedtime.   Yes Historical Provider, MD  labetalol (NORMODYNE) 200 MG tablet Take 200 mg by mouth 2 (two) times daily.   Yes Historical Provider, MD  olmesartan-hydrochlorothiazide (BENICAR HCT) 40-25 MG per tablet Take 1 tablet by mouth daily.   Yes Historical Provider, MD  pravastatin (PRAVACHOL) 40 MG tablet Take 40 mg by mouth daily.   Yes Historical Provider, MD  triamcinolone cream (KENALOG) 0.1 % APPLY TWICE DAILY 01/13/12  Yes Ryan M Dunn, PA-C  HYDROcodone-acetaminophen (NORCO/VICODIN) 5-325 MG per tablet Take 1 tablet by mouth every 4 (four) hours as needed  for pain. Patient not taking: Reported on 03/17/2015 02/05/13   Prentiss Bells, MD  ondansetron (ZOFRAN ODT) 4 MG disintegrating tablet Take 1 tablet (4 mg total) by mouth every 8 (eight) hours as needed for nausea. Patient not taking: Reported on 03/17/2015 02/05/13   Prentiss Bells, MD  sertraline (ZOLOFT) 25 MG tablet Take 25 mg by mouth daily.    Historical Provider, MD     ROS: The patient denies fevers, chills, night sweats, unintentional weight loss, chest pain, palpitations, wheezing, dyspnea on exertion, nausea, vomiting, abdominal pain, dysuria, hematuria, melena, numbness, weakness, or tingling.  All other systems have been reviewed and were otherwise negative with the exception of those mentioned in the HPI and as above.    PHYSICAL EXAM: Filed Vitals:   03/17/15 1315  BP: 130/60  Pulse: 72  Temp: 97.4 F (36.3 C)  Resp: 28   Body  mass index is 32.02 kg/(m^2).   General: Alert, no acute distress HEENT:  Normocephalic, atraumatic, oropharynx patent. EOMI, PERRLA Cardiovascular:  Regular rate and rhythm, no rubs murmurs or gallops.   Respiratory: Clear to auscultation bilaterally.  No wheezes, rales, or rhonchi.  No cyanosis, no use of accessory musculature Abdominal: No organomegaly, abdomen is soft and non-tender, positive bowel sounds. No masses. Skin: +rashes. Please look at images in media section Neurologic: Facial musculature symmetric. Psychiatric: Patient acts appropriately throughout our interaction. Lymphatic: No cervical or submandibular lymphadenopathy Musculoskeletal: Gait intact. No edema, tenderness    LABS: Results for orders placed or performed in visit on 03/17/15  POCT CBC  Result Value Ref Range   WBC 9.2 4.6 - 10.2 K/uL   Lymph, poc 1.8 0.6 - 3.4   POC LYMPH PERCENT 19.6 10 - 50 %L   MID (cbc) 0.8 0 - 0.9   POC MID % 8.5 0 - 12 %M   POC Granulocyte 6.6 2 - 6.9   Granulocyte percent 71.9 37 - 80 %G   RBC 4.29 4.04 - 5.48 M/uL   Hemoglobin 11.7 (A) 12.2 - 16.2 g/dL   HCT, POC 38.0 37.7 - 47.9 %   MCV 88.8 80 - 97 fL   MCH, POC 27.4 27 - 31.2 pg   MCHC 30.9 (A) 31.8 - 35.4 g/dL   RDW, POC 16.4 %   Platelet Count, POC 338 142 - 424 K/uL   MPV 7.7 0 - 99.8 fL  POCT Skin KOH  Result Value Ref Range   Skin KOH, POC Negative   POCT glycosylated hemoglobin (Hb A1C)  Result Value Ref Range   Hemoglobin A1C 10.0   POCT glucose (manual entry)  Result Value Ref Range   POC Glucose 343 (A) 70 - 99 mg/dl     EKG/XRAY:   Primary read interpreted by Dr. Marin Comment at [facility].   ASSESSMENT/PLAN: Encounter Diagnoses  Name Primary?  . Rash and nonspecific skin eruption Yes  . Type 2 diabetes mellitus without complication   . Colon cancer    Labs pending Poorly controlled DM so is contributing to this, I cannot give her steroid since sugars are so hig, she states she is taking her insulin but am  not sure have asked granddaughter to moniotr her sugars, ask her to check  meter Refer to Derm Rx Clobetasol, hydralazine PRecautiosn given with both meds Need to fu with Dr Nevada Crane, also refer to [facility].  Gross sideeffects, risk and benefits, and alternatives of medications d/w patient. Patient is aware that all medications have potential sideeffects and  we are unable to predict every sideeffect or drug-drug interaction that may occur.  Kayler Rise DO  03/17/2015 2:55 PM

## 2015-03-19 ENCOUNTER — Telehealth: Payer: Self-pay | Admitting: Family Medicine

## 2015-03-19 NOTE — Telephone Encounter (Signed)
Lm about labs, will fax labs to Dr Nevada Crane as well

## 2015-04-06 DIAGNOSIS — L259 Unspecified contact dermatitis, unspecified cause: Secondary | ICD-10-CM | POA: Diagnosis not present

## 2015-04-20 DIAGNOSIS — L259 Unspecified contact dermatitis, unspecified cause: Secondary | ICD-10-CM | POA: Diagnosis not present

## 2015-06-23 DIAGNOSIS — Z23 Encounter for immunization: Secondary | ICD-10-CM | POA: Diagnosis not present

## 2015-06-23 DIAGNOSIS — L259 Unspecified contact dermatitis, unspecified cause: Secondary | ICD-10-CM | POA: Diagnosis not present

## 2015-09-18 DIAGNOSIS — R05 Cough: Secondary | ICD-10-CM | POA: Diagnosis not present

## 2015-09-18 DIAGNOSIS — J00 Acute nasopharyngitis [common cold]: Secondary | ICD-10-CM | POA: Diagnosis not present

## 2015-10-26 ENCOUNTER — Encounter (HOSPITAL_BASED_OUTPATIENT_CLINIC_OR_DEPARTMENT_OTHER): Payer: Medicare Other | Attending: Internal Medicine

## 2015-11-07 DIAGNOSIS — S81809A Unspecified open wound, unspecified lower leg, initial encounter: Secondary | ICD-10-CM | POA: Diagnosis not present

## 2015-11-07 DIAGNOSIS — L2389 Allergic contact dermatitis due to other agents: Secondary | ICD-10-CM | POA: Diagnosis not present

## 2015-11-07 DIAGNOSIS — R062 Wheezing: Secondary | ICD-10-CM | POA: Diagnosis not present

## 2015-11-22 DIAGNOSIS — E1165 Type 2 diabetes mellitus with hyperglycemia: Secondary | ICD-10-CM | POA: Diagnosis not present

## 2015-12-18 ENCOUNTER — Ambulatory Visit: Payer: Medicare Other | Admitting: Allergy and Immunology

## 2015-12-24 ENCOUNTER — Encounter (HOSPITAL_BASED_OUTPATIENT_CLINIC_OR_DEPARTMENT_OTHER): Payer: Medicare Other | Attending: Internal Medicine

## 2015-12-24 DIAGNOSIS — L97813 Non-pressure chronic ulcer of other part of right lower leg with necrosis of muscle: Secondary | ICD-10-CM | POA: Diagnosis not present

## 2015-12-24 DIAGNOSIS — Z9221 Personal history of antineoplastic chemotherapy: Secondary | ICD-10-CM | POA: Diagnosis not present

## 2015-12-24 DIAGNOSIS — M069 Rheumatoid arthritis, unspecified: Secondary | ICD-10-CM | POA: Insufficient documentation

## 2015-12-24 DIAGNOSIS — Z7982 Long term (current) use of aspirin: Secondary | ICD-10-CM | POA: Diagnosis not present

## 2015-12-24 DIAGNOSIS — Z79899 Other long term (current) drug therapy: Secondary | ICD-10-CM | POA: Insufficient documentation

## 2015-12-24 DIAGNOSIS — Z794 Long term (current) use of insulin: Secondary | ICD-10-CM | POA: Diagnosis not present

## 2015-12-24 DIAGNOSIS — E11622 Type 2 diabetes mellitus with other skin ulcer: Secondary | ICD-10-CM | POA: Insufficient documentation

## 2015-12-24 DIAGNOSIS — L97213 Non-pressure chronic ulcer of right calf with necrosis of muscle: Secondary | ICD-10-CM | POA: Diagnosis not present

## 2015-12-28 ENCOUNTER — Encounter (HOSPITAL_BASED_OUTPATIENT_CLINIC_OR_DEPARTMENT_OTHER): Payer: Medicare Other

## 2015-12-31 DIAGNOSIS — M069 Rheumatoid arthritis, unspecified: Secondary | ICD-10-CM | POA: Diagnosis not present

## 2015-12-31 DIAGNOSIS — Z79899 Other long term (current) drug therapy: Secondary | ICD-10-CM | POA: Diagnosis not present

## 2015-12-31 DIAGNOSIS — Z794 Long term (current) use of insulin: Secondary | ICD-10-CM | POA: Diagnosis not present

## 2015-12-31 DIAGNOSIS — Z7982 Long term (current) use of aspirin: Secondary | ICD-10-CM | POA: Diagnosis not present

## 2015-12-31 DIAGNOSIS — Z9221 Personal history of antineoplastic chemotherapy: Secondary | ICD-10-CM | POA: Diagnosis not present

## 2015-12-31 DIAGNOSIS — S81801A Unspecified open wound, right lower leg, initial encounter: Secondary | ICD-10-CM | POA: Diagnosis not present

## 2015-12-31 DIAGNOSIS — E11622 Type 2 diabetes mellitus with other skin ulcer: Secondary | ICD-10-CM | POA: Diagnosis not present

## 2015-12-31 DIAGNOSIS — L97213 Non-pressure chronic ulcer of right calf with necrosis of muscle: Secondary | ICD-10-CM | POA: Diagnosis not present

## 2016-01-07 DIAGNOSIS — Z9221 Personal history of antineoplastic chemotherapy: Secondary | ICD-10-CM | POA: Diagnosis not present

## 2016-01-07 DIAGNOSIS — L97811 Non-pressure chronic ulcer of other part of right lower leg limited to breakdown of skin: Secondary | ICD-10-CM | POA: Diagnosis not present

## 2016-01-07 DIAGNOSIS — Z7982 Long term (current) use of aspirin: Secondary | ICD-10-CM | POA: Diagnosis not present

## 2016-01-07 DIAGNOSIS — E11622 Type 2 diabetes mellitus with other skin ulcer: Secondary | ICD-10-CM | POA: Diagnosis not present

## 2016-01-07 DIAGNOSIS — M069 Rheumatoid arthritis, unspecified: Secondary | ICD-10-CM | POA: Diagnosis not present

## 2016-01-07 DIAGNOSIS — Z79899 Other long term (current) drug therapy: Secondary | ICD-10-CM | POA: Diagnosis not present

## 2016-01-07 DIAGNOSIS — L97213 Non-pressure chronic ulcer of right calf with necrosis of muscle: Secondary | ICD-10-CM | POA: Diagnosis not present

## 2016-01-07 DIAGNOSIS — Z794 Long term (current) use of insulin: Secondary | ICD-10-CM | POA: Diagnosis not present

## 2016-01-14 ENCOUNTER — Encounter (HOSPITAL_BASED_OUTPATIENT_CLINIC_OR_DEPARTMENT_OTHER): Payer: Medicare Other | Attending: Internal Medicine

## 2016-01-14 DIAGNOSIS — L97811 Non-pressure chronic ulcer of other part of right lower leg limited to breakdown of skin: Secondary | ICD-10-CM | POA: Insufficient documentation

## 2016-01-14 DIAGNOSIS — E11622 Type 2 diabetes mellitus with other skin ulcer: Secondary | ICD-10-CM | POA: Diagnosis not present

## 2016-01-14 DIAGNOSIS — Z9221 Personal history of antineoplastic chemotherapy: Secondary | ICD-10-CM | POA: Insufficient documentation

## 2016-01-14 DIAGNOSIS — M069 Rheumatoid arthritis, unspecified: Secondary | ICD-10-CM | POA: Diagnosis not present

## 2016-01-14 DIAGNOSIS — I10 Essential (primary) hypertension: Secondary | ICD-10-CM | POA: Diagnosis not present

## 2016-01-21 DIAGNOSIS — L97211 Non-pressure chronic ulcer of right calf limited to breakdown of skin: Secondary | ICD-10-CM | POA: Diagnosis not present

## 2016-01-21 DIAGNOSIS — M069 Rheumatoid arthritis, unspecified: Secondary | ICD-10-CM | POA: Diagnosis not present

## 2016-01-21 DIAGNOSIS — E11622 Type 2 diabetes mellitus with other skin ulcer: Secondary | ICD-10-CM | POA: Diagnosis not present

## 2016-01-21 DIAGNOSIS — I10 Essential (primary) hypertension: Secondary | ICD-10-CM | POA: Diagnosis not present

## 2016-01-21 DIAGNOSIS — Z9221 Personal history of antineoplastic chemotherapy: Secondary | ICD-10-CM | POA: Diagnosis not present

## 2016-01-21 DIAGNOSIS — L97811 Non-pressure chronic ulcer of other part of right lower leg limited to breakdown of skin: Secondary | ICD-10-CM | POA: Diagnosis not present

## 2016-01-28 DIAGNOSIS — M069 Rheumatoid arthritis, unspecified: Secondary | ICD-10-CM | POA: Diagnosis not present

## 2016-01-28 DIAGNOSIS — L97211 Non-pressure chronic ulcer of right calf limited to breakdown of skin: Secondary | ICD-10-CM | POA: Diagnosis not present

## 2016-01-28 DIAGNOSIS — I10 Essential (primary) hypertension: Secondary | ICD-10-CM | POA: Diagnosis not present

## 2016-01-28 DIAGNOSIS — Z9221 Personal history of antineoplastic chemotherapy: Secondary | ICD-10-CM | POA: Diagnosis not present

## 2016-01-28 DIAGNOSIS — L97811 Non-pressure chronic ulcer of other part of right lower leg limited to breakdown of skin: Secondary | ICD-10-CM | POA: Diagnosis not present

## 2016-01-28 DIAGNOSIS — E11622 Type 2 diabetes mellitus with other skin ulcer: Secondary | ICD-10-CM | POA: Diagnosis not present

## 2016-02-04 DIAGNOSIS — Z9221 Personal history of antineoplastic chemotherapy: Secondary | ICD-10-CM | POA: Diagnosis not present

## 2016-02-04 DIAGNOSIS — R21 Rash and other nonspecific skin eruption: Secondary | ICD-10-CM | POA: Diagnosis not present

## 2016-02-04 DIAGNOSIS — L97811 Non-pressure chronic ulcer of other part of right lower leg limited to breakdown of skin: Secondary | ICD-10-CM | POA: Diagnosis not present

## 2016-02-04 DIAGNOSIS — I10 Essential (primary) hypertension: Secondary | ICD-10-CM | POA: Diagnosis not present

## 2016-02-04 DIAGNOSIS — M069 Rheumatoid arthritis, unspecified: Secondary | ICD-10-CM | POA: Diagnosis not present

## 2016-02-04 DIAGNOSIS — L97211 Non-pressure chronic ulcer of right calf limited to breakdown of skin: Secondary | ICD-10-CM | POA: Diagnosis not present

## 2016-02-04 DIAGNOSIS — E11622 Type 2 diabetes mellitus with other skin ulcer: Secondary | ICD-10-CM | POA: Diagnosis not present

## 2016-02-09 DIAGNOSIS — I10 Essential (primary) hypertension: Secondary | ICD-10-CM | POA: Diagnosis not present

## 2016-02-11 DIAGNOSIS — L97211 Non-pressure chronic ulcer of right calf limited to breakdown of skin: Secondary | ICD-10-CM | POA: Diagnosis not present

## 2016-02-11 DIAGNOSIS — M069 Rheumatoid arthritis, unspecified: Secondary | ICD-10-CM | POA: Diagnosis not present

## 2016-02-11 DIAGNOSIS — Z9221 Personal history of antineoplastic chemotherapy: Secondary | ICD-10-CM | POA: Diagnosis not present

## 2016-02-11 DIAGNOSIS — E11622 Type 2 diabetes mellitus with other skin ulcer: Secondary | ICD-10-CM | POA: Diagnosis not present

## 2016-02-11 DIAGNOSIS — L97811 Non-pressure chronic ulcer of other part of right lower leg limited to breakdown of skin: Secondary | ICD-10-CM | POA: Diagnosis not present

## 2016-02-11 DIAGNOSIS — I10 Essential (primary) hypertension: Secondary | ICD-10-CM | POA: Diagnosis not present

## 2016-02-15 DIAGNOSIS — L255 Unspecified contact dermatitis due to plants, except food: Secondary | ICD-10-CM | POA: Diagnosis not present

## 2016-02-18 ENCOUNTER — Encounter (HOSPITAL_BASED_OUTPATIENT_CLINIC_OR_DEPARTMENT_OTHER): Payer: Medicare Other | Attending: Internal Medicine

## 2016-02-18 DIAGNOSIS — L97911 Non-pressure chronic ulcer of unspecified part of right lower leg limited to breakdown of skin: Secondary | ICD-10-CM | POA: Diagnosis not present

## 2016-02-18 DIAGNOSIS — R21 Rash and other nonspecific skin eruption: Secondary | ICD-10-CM | POA: Insufficient documentation

## 2016-02-18 DIAGNOSIS — E11622 Type 2 diabetes mellitus with other skin ulcer: Secondary | ICD-10-CM | POA: Insufficient documentation

## 2016-02-18 DIAGNOSIS — I10 Essential (primary) hypertension: Secondary | ICD-10-CM | POA: Diagnosis not present

## 2016-02-18 DIAGNOSIS — L97111 Non-pressure chronic ulcer of right thigh limited to breakdown of skin: Secondary | ICD-10-CM | POA: Insufficient documentation

## 2016-02-18 DIAGNOSIS — M069 Rheumatoid arthritis, unspecified: Secondary | ICD-10-CM | POA: Diagnosis not present

## 2016-02-18 DIAGNOSIS — Z9221 Personal history of antineoplastic chemotherapy: Secondary | ICD-10-CM | POA: Diagnosis not present

## 2016-02-18 DIAGNOSIS — L97811 Non-pressure chronic ulcer of other part of right lower leg limited to breakdown of skin: Secondary | ICD-10-CM | POA: Diagnosis not present

## 2016-02-25 DIAGNOSIS — L97811 Non-pressure chronic ulcer of other part of right lower leg limited to breakdown of skin: Secondary | ICD-10-CM | POA: Diagnosis not present

## 2016-02-25 DIAGNOSIS — I10 Essential (primary) hypertension: Secondary | ICD-10-CM | POA: Diagnosis not present

## 2016-02-25 DIAGNOSIS — Z9221 Personal history of antineoplastic chemotherapy: Secondary | ICD-10-CM | POA: Diagnosis not present

## 2016-02-25 DIAGNOSIS — L97911 Non-pressure chronic ulcer of unspecified part of right lower leg limited to breakdown of skin: Secondary | ICD-10-CM | POA: Diagnosis not present

## 2016-02-25 DIAGNOSIS — M069 Rheumatoid arthritis, unspecified: Secondary | ICD-10-CM | POA: Diagnosis not present

## 2016-02-25 DIAGNOSIS — E11622 Type 2 diabetes mellitus with other skin ulcer: Secondary | ICD-10-CM | POA: Diagnosis not present

## 2016-02-25 DIAGNOSIS — L97111 Non-pressure chronic ulcer of right thigh limited to breakdown of skin: Secondary | ICD-10-CM | POA: Diagnosis not present

## 2016-02-25 DIAGNOSIS — R21 Rash and other nonspecific skin eruption: Secondary | ICD-10-CM | POA: Diagnosis not present

## 2016-03-03 DIAGNOSIS — L97811 Non-pressure chronic ulcer of other part of right lower leg limited to breakdown of skin: Secondary | ICD-10-CM | POA: Diagnosis not present

## 2016-03-03 DIAGNOSIS — Z9221 Personal history of antineoplastic chemotherapy: Secondary | ICD-10-CM | POA: Diagnosis not present

## 2016-03-03 DIAGNOSIS — I10 Essential (primary) hypertension: Secondary | ICD-10-CM | POA: Diagnosis not present

## 2016-03-03 DIAGNOSIS — L97911 Non-pressure chronic ulcer of unspecified part of right lower leg limited to breakdown of skin: Secondary | ICD-10-CM | POA: Diagnosis not present

## 2016-03-03 DIAGNOSIS — E11622 Type 2 diabetes mellitus with other skin ulcer: Secondary | ICD-10-CM | POA: Diagnosis not present

## 2016-03-03 DIAGNOSIS — L97111 Non-pressure chronic ulcer of right thigh limited to breakdown of skin: Secondary | ICD-10-CM | POA: Diagnosis not present

## 2016-03-03 DIAGNOSIS — R21 Rash and other nonspecific skin eruption: Secondary | ICD-10-CM | POA: Diagnosis not present

## 2016-03-03 DIAGNOSIS — S71102A Unspecified open wound, left thigh, initial encounter: Secondary | ICD-10-CM | POA: Diagnosis not present

## 2016-03-03 DIAGNOSIS — M069 Rheumatoid arthritis, unspecified: Secondary | ICD-10-CM | POA: Diagnosis not present

## 2016-03-10 DIAGNOSIS — L97111 Non-pressure chronic ulcer of right thigh limited to breakdown of skin: Secondary | ICD-10-CM | POA: Diagnosis not present

## 2016-03-10 DIAGNOSIS — I10 Essential (primary) hypertension: Secondary | ICD-10-CM | POA: Diagnosis not present

## 2016-03-10 DIAGNOSIS — R21 Rash and other nonspecific skin eruption: Secondary | ICD-10-CM | POA: Diagnosis not present

## 2016-03-10 DIAGNOSIS — L97213 Non-pressure chronic ulcer of right calf with necrosis of muscle: Secondary | ICD-10-CM | POA: Diagnosis not present

## 2016-03-10 DIAGNOSIS — L97911 Non-pressure chronic ulcer of unspecified part of right lower leg limited to breakdown of skin: Secondary | ICD-10-CM | POA: Diagnosis not present

## 2016-03-10 DIAGNOSIS — M069 Rheumatoid arthritis, unspecified: Secondary | ICD-10-CM | POA: Diagnosis not present

## 2016-03-10 DIAGNOSIS — E11622 Type 2 diabetes mellitus with other skin ulcer: Secondary | ICD-10-CM | POA: Diagnosis not present

## 2016-03-10 DIAGNOSIS — Z9221 Personal history of antineoplastic chemotherapy: Secondary | ICD-10-CM | POA: Diagnosis not present

## 2016-03-14 DIAGNOSIS — L308 Other specified dermatitis: Secondary | ICD-10-CM | POA: Diagnosis not present

## 2016-03-21 ENCOUNTER — Encounter (HOSPITAL_BASED_OUTPATIENT_CLINIC_OR_DEPARTMENT_OTHER): Payer: Medicare Other | Attending: Internal Medicine

## 2016-03-21 DIAGNOSIS — M069 Rheumatoid arthritis, unspecified: Secondary | ICD-10-CM | POA: Insufficient documentation

## 2016-03-21 DIAGNOSIS — L97111 Non-pressure chronic ulcer of right thigh limited to breakdown of skin: Secondary | ICD-10-CM | POA: Diagnosis not present

## 2016-03-21 DIAGNOSIS — E1136 Type 2 diabetes mellitus with diabetic cataract: Secondary | ICD-10-CM | POA: Insufficient documentation

## 2016-03-21 DIAGNOSIS — E11622 Type 2 diabetes mellitus with other skin ulcer: Secondary | ICD-10-CM | POA: Insufficient documentation

## 2016-03-21 DIAGNOSIS — Z9221 Personal history of antineoplastic chemotherapy: Secondary | ICD-10-CM | POA: Diagnosis not present

## 2016-03-21 DIAGNOSIS — L97811 Non-pressure chronic ulcer of other part of right lower leg limited to breakdown of skin: Secondary | ICD-10-CM | POA: Diagnosis not present

## 2016-03-21 DIAGNOSIS — I10 Essential (primary) hypertension: Secondary | ICD-10-CM | POA: Insufficient documentation

## 2016-03-21 DIAGNOSIS — L97213 Non-pressure chronic ulcer of right calf with necrosis of muscle: Secondary | ICD-10-CM | POA: Diagnosis not present

## 2016-03-31 DIAGNOSIS — E1136 Type 2 diabetes mellitus with diabetic cataract: Secondary | ICD-10-CM | POA: Diagnosis not present

## 2016-03-31 DIAGNOSIS — S71101A Unspecified open wound, right thigh, initial encounter: Secondary | ICD-10-CM | POA: Diagnosis not present

## 2016-03-31 DIAGNOSIS — L97811 Non-pressure chronic ulcer of other part of right lower leg limited to breakdown of skin: Secondary | ICD-10-CM | POA: Diagnosis not present

## 2016-03-31 DIAGNOSIS — Z9221 Personal history of antineoplastic chemotherapy: Secondary | ICD-10-CM | POA: Diagnosis not present

## 2016-03-31 DIAGNOSIS — E11622 Type 2 diabetes mellitus with other skin ulcer: Secondary | ICD-10-CM | POA: Diagnosis not present

## 2016-03-31 DIAGNOSIS — I10 Essential (primary) hypertension: Secondary | ICD-10-CM | POA: Diagnosis not present

## 2016-03-31 DIAGNOSIS — L97111 Non-pressure chronic ulcer of right thigh limited to breakdown of skin: Secondary | ICD-10-CM | POA: Diagnosis not present

## 2016-03-31 DIAGNOSIS — M069 Rheumatoid arthritis, unspecified: Secondary | ICD-10-CM | POA: Diagnosis not present

## 2016-04-07 DIAGNOSIS — E1136 Type 2 diabetes mellitus with diabetic cataract: Secondary | ICD-10-CM | POA: Diagnosis not present

## 2016-04-07 DIAGNOSIS — E11622 Type 2 diabetes mellitus with other skin ulcer: Secondary | ICD-10-CM | POA: Diagnosis not present

## 2016-04-07 DIAGNOSIS — Z9221 Personal history of antineoplastic chemotherapy: Secondary | ICD-10-CM | POA: Diagnosis not present

## 2016-04-07 DIAGNOSIS — L97811 Non-pressure chronic ulcer of other part of right lower leg limited to breakdown of skin: Secondary | ICD-10-CM | POA: Diagnosis not present

## 2016-04-07 DIAGNOSIS — M069 Rheumatoid arthritis, unspecified: Secondary | ICD-10-CM | POA: Diagnosis not present

## 2016-04-07 DIAGNOSIS — I10 Essential (primary) hypertension: Secondary | ICD-10-CM | POA: Diagnosis not present

## 2016-04-07 DIAGNOSIS — L97111 Non-pressure chronic ulcer of right thigh limited to breakdown of skin: Secondary | ICD-10-CM | POA: Diagnosis not present

## 2016-04-14 ENCOUNTER — Encounter (HOSPITAL_BASED_OUTPATIENT_CLINIC_OR_DEPARTMENT_OTHER): Payer: Medicare Other | Attending: Internal Medicine

## 2016-04-14 DIAGNOSIS — E119 Type 2 diabetes mellitus without complications: Secondary | ICD-10-CM | POA: Diagnosis not present

## 2016-04-14 DIAGNOSIS — M069 Rheumatoid arthritis, unspecified: Secondary | ICD-10-CM | POA: Insufficient documentation

## 2016-04-14 DIAGNOSIS — Z8631 Personal history of diabetic foot ulcer: Secondary | ICD-10-CM | POA: Diagnosis not present

## 2016-04-14 DIAGNOSIS — Z09 Encounter for follow-up examination after completed treatment for conditions other than malignant neoplasm: Secondary | ICD-10-CM | POA: Insufficient documentation

## 2016-04-14 DIAGNOSIS — Z9221 Personal history of antineoplastic chemotherapy: Secondary | ICD-10-CM | POA: Insufficient documentation

## 2016-04-14 DIAGNOSIS — I10 Essential (primary) hypertension: Secondary | ICD-10-CM | POA: Insufficient documentation

## 2016-04-14 DIAGNOSIS — Z872 Personal history of diseases of the skin and subcutaneous tissue: Secondary | ICD-10-CM | POA: Diagnosis not present

## 2016-05-05 DIAGNOSIS — T887XXA Unspecified adverse effect of drug or medicament, initial encounter: Secondary | ICD-10-CM | POA: Diagnosis not present

## 2016-05-05 DIAGNOSIS — I10 Essential (primary) hypertension: Secondary | ICD-10-CM | POA: Diagnosis not present

## 2016-05-10 DIAGNOSIS — L258 Unspecified contact dermatitis due to other agents: Secondary | ICD-10-CM | POA: Diagnosis not present

## 2016-05-14 DIAGNOSIS — J708 Respiratory conditions due to other specified external agents: Secondary | ICD-10-CM | POA: Diagnosis not present

## 2016-05-14 DIAGNOSIS — J45998 Other asthma: Secondary | ICD-10-CM | POA: Diagnosis not present

## 2016-05-14 DIAGNOSIS — R062 Wheezing: Secondary | ICD-10-CM | POA: Diagnosis not present

## 2016-05-14 DIAGNOSIS — R0602 Shortness of breath: Secondary | ICD-10-CM | POA: Diagnosis not present

## 2016-05-14 DIAGNOSIS — I509 Heart failure, unspecified: Secondary | ICD-10-CM

## 2016-05-14 HISTORY — DX: Heart failure, unspecified: I50.9

## 2016-05-15 ENCOUNTER — Emergency Department (HOSPITAL_COMMUNITY): Payer: Medicare Other

## 2016-05-15 ENCOUNTER — Inpatient Hospital Stay (HOSPITAL_COMMUNITY)
Admission: EM | Admit: 2016-05-15 | Discharge: 2016-05-18 | DRG: 291 | Disposition: A | Discharge status: Home / Self Care | Payer: Medicare Other | Attending: Internal Medicine | Admitting: Internal Medicine

## 2016-05-15 ENCOUNTER — Encounter (HOSPITAL_COMMUNITY): Payer: Self-pay

## 2016-05-15 DIAGNOSIS — R0602 Shortness of breath: Secondary | ICD-10-CM | POA: Diagnosis not present

## 2016-05-15 DIAGNOSIS — Z85038 Personal history of other malignant neoplasm of large intestine: Secondary | ICD-10-CM

## 2016-05-15 DIAGNOSIS — F419 Anxiety disorder, unspecified: Secondary | ICD-10-CM | POA: Diagnosis present

## 2016-05-15 DIAGNOSIS — J9601 Acute respiratory failure with hypoxia: Secondary | ICD-10-CM | POA: Diagnosis not present

## 2016-05-15 DIAGNOSIS — Z6830 Body mass index (BMI) 30.0-30.9, adult: Secondary | ICD-10-CM

## 2016-05-15 DIAGNOSIS — E1169 Type 2 diabetes mellitus with other specified complication: Secondary | ICD-10-CM | POA: Diagnosis present

## 2016-05-15 DIAGNOSIS — Z809 Family history of malignant neoplasm, unspecified: Secondary | ICD-10-CM | POA: Diagnosis not present

## 2016-05-15 DIAGNOSIS — I2510 Atherosclerotic heart disease of native coronary artery without angina pectoris: Secondary | ICD-10-CM | POA: Diagnosis not present

## 2016-05-15 DIAGNOSIS — I5030 Unspecified diastolic (congestive) heart failure: Secondary | ICD-10-CM | POA: Diagnosis not present

## 2016-05-15 DIAGNOSIS — I10 Essential (primary) hypertension: Secondary | ICD-10-CM | POA: Diagnosis present

## 2016-05-15 DIAGNOSIS — Z7982 Long term (current) use of aspirin: Secondary | ICD-10-CM | POA: Diagnosis not present

## 2016-05-15 DIAGNOSIS — Z9221 Personal history of antineoplastic chemotherapy: Secondary | ICD-10-CM

## 2016-05-15 DIAGNOSIS — Z823 Family history of stroke: Secondary | ICD-10-CM

## 2016-05-15 DIAGNOSIS — Z7951 Long term (current) use of inhaled steroids: Secondary | ICD-10-CM

## 2016-05-15 DIAGNOSIS — R21 Rash and other nonspecific skin eruption: Secondary | ICD-10-CM | POA: Diagnosis present

## 2016-05-15 DIAGNOSIS — R0902 Hypoxemia: Secondary | ICD-10-CM

## 2016-05-15 DIAGNOSIS — I509 Heart failure, unspecified: Secondary | ICD-10-CM

## 2016-05-15 DIAGNOSIS — E785 Hyperlipidemia, unspecified: Secondary | ICD-10-CM | POA: Diagnosis not present

## 2016-05-15 DIAGNOSIS — Z833 Family history of diabetes mellitus: Secondary | ICD-10-CM

## 2016-05-15 DIAGNOSIS — E1165 Type 2 diabetes mellitus with hyperglycemia: Secondary | ICD-10-CM | POA: Diagnosis present

## 2016-05-15 DIAGNOSIS — Z888 Allergy status to other drugs, medicaments and biological substances status: Secondary | ICD-10-CM | POA: Diagnosis not present

## 2016-05-15 DIAGNOSIS — E119 Type 2 diabetes mellitus without complications: Secondary | ICD-10-CM | POA: Diagnosis not present

## 2016-05-15 DIAGNOSIS — I110 Hypertensive heart disease with heart failure: Principal | ICD-10-CM | POA: Diagnosis present

## 2016-05-15 DIAGNOSIS — E669 Obesity, unspecified: Secondary | ICD-10-CM | POA: Diagnosis present

## 2016-05-15 DIAGNOSIS — Z794 Long term (current) use of insulin: Secondary | ICD-10-CM

## 2016-05-15 DIAGNOSIS — I5033 Acute on chronic diastolic (congestive) heart failure: Secondary | ICD-10-CM | POA: Diagnosis present

## 2016-05-15 DIAGNOSIS — R0600 Dyspnea, unspecified: Secondary | ICD-10-CM | POA: Diagnosis present

## 2016-05-15 LAB — CBC WITH DIFFERENTIAL/PLATELET
BASOS PCT: 0 %
Basophils Absolute: 0 10*3/uL (ref 0.0–0.1)
EOS PCT: 1 %
Eosinophils Absolute: 0.1 10*3/uL (ref 0.0–0.7)
HCT: 32.9 % — ABNORMAL LOW (ref 36.0–46.0)
Hemoglobin: 10.9 g/dL — ABNORMAL LOW (ref 12.0–15.0)
LYMPHS ABS: 1 10*3/uL (ref 0.7–4.0)
Lymphocytes Relative: 7 %
MCH: 29.2 pg (ref 26.0–34.0)
MCHC: 33.1 g/dL (ref 30.0–36.0)
MCV: 88.2 fL (ref 78.0–100.0)
Monocytes Absolute: 0.7 10*3/uL (ref 0.1–1.0)
Monocytes Relative: 5 %
Neutro Abs: 12.2 10*3/uL — ABNORMAL HIGH (ref 1.7–7.7)
Neutrophils Relative %: 87 %
Platelets: 311 10*3/uL (ref 150–400)
RBC: 3.73 MIL/uL — ABNORMAL LOW (ref 3.87–5.11)
RDW: 14.2 % (ref 11.5–15.5)
WBC: 13.9 10*3/uL — ABNORMAL HIGH (ref 4.0–10.5)

## 2016-05-15 LAB — COMPREHENSIVE METABOLIC PANEL
ALK PHOS: 72 U/L (ref 38–126)
ALT: 70 U/L — ABNORMAL HIGH (ref 14–54)
ANION GAP: 6 (ref 5–15)
AST: 41 U/L (ref 15–41)
Albumin: 2.9 g/dL — ABNORMAL LOW (ref 3.5–5.0)
BUN: 34 mg/dL — ABNORMAL HIGH (ref 6–20)
CALCIUM: 8.1 mg/dL — AB (ref 8.9–10.3)
CO2: 24 mmol/L (ref 22–32)
Chloride: 110 mmol/L (ref 101–111)
Creatinine, Ser: 0.94 mg/dL (ref 0.44–1.00)
GFR calc non Af Amer: 55 mL/min — ABNORMAL LOW (ref >60)
Glucose, Bld: 180 mg/dL — ABNORMAL HIGH (ref 65–99)
POTASSIUM: 3.9 mmol/L (ref 3.5–5.1)
Sodium: 140 mmol/L (ref 135–145)
Total Bilirubin: 0.6 mg/dL (ref 0.3–1.2)
Total Protein: 6.8 g/dL (ref 6.5–8.1)

## 2016-05-15 LAB — GLUCOSE, CAPILLARY
Glucose-Capillary: 341 mg/dL — ABNORMAL HIGH (ref 65–99)
Glucose-Capillary: 375 mg/dL — ABNORMAL HIGH (ref 65–99)

## 2016-05-15 LAB — BRAIN NATRIURETIC PEPTIDE: B NATRIURETIC PEPTIDE 5: 385 pg/mL — AB (ref 0.0–100.0)

## 2016-05-15 LAB — TROPONIN I
TROPONIN I: 0.03 ng/mL — AB (ref <0.03)
Troponin I: 0.04 ng/mL (ref <0.03)

## 2016-05-15 MED ORDER — LABETALOL HCL 200 MG PO TABS
200.0000 mg | ORAL_TABLET | Freq: Two times a day (BID) | ORAL | Status: DC
  Administered 2016-05-15 – 2016-05-18 (×6): 200 mg via ORAL
  Filled 2016-05-15 (×6): qty 1

## 2016-05-15 MED ORDER — ASPIRIN 81 MG PO CHEW
324.0000 mg | CHEWABLE_TABLET | Freq: Once | ORAL | Status: AC
  Administered 2016-05-15: 324 mg via ORAL
  Filled 2016-05-15: qty 4

## 2016-05-15 MED ORDER — ONDANSETRON HCL 4 MG/2ML IJ SOLN: 4.0000 mg | Freq: Four times a day (QID) | INTRAMUSCULAR | Status: DC | PRN

## 2016-05-15 MED ORDER — FUROSEMIDE 10 MG/ML IJ SOLN
40.0000 mg | Freq: Two times a day (BID) | INTRAMUSCULAR | Status: DC
  Administered 2016-05-16 – 2016-05-17 (×3): 40 mg via INTRAVENOUS
  Filled 2016-05-15 (×3): qty 4

## 2016-05-15 MED ORDER — HYDROCODONE-ACETAMINOPHEN 5-325 MG PO TABS
1.0000 | ORAL_TABLET | ORAL | Status: DC | PRN
Start: 2016-05-15 — End: 2016-05-18

## 2016-05-15 MED ORDER — ASPIRIN EC 81 MG PO TBEC
81.0000 mg | DELAYED_RELEASE_TABLET | Freq: Every day | ORAL | Status: DC
  Administered 2016-05-16 – 2016-05-18 (×3): 81 mg via ORAL
  Filled 2016-05-15 (×3): qty 1

## 2016-05-15 MED ORDER — PRAVASTATIN SODIUM 40 MG PO TABS
40.0000 mg | ORAL_TABLET | Freq: Every day | ORAL | Status: DC
  Administered 2016-05-16 – 2016-05-18 (×3): 40 mg via ORAL
  Filled 2016-05-15 (×2): qty 1
  Filled 2016-05-15: qty 4

## 2016-05-15 MED ORDER — SODIUM CHLORIDE 0.9% FLUSH
3.0000 mL | INTRAVENOUS | Status: DC | PRN
  Administered 2016-05-16: 3 mL via INTRAVENOUS
  Filled 2016-05-15: qty 3

## 2016-05-15 MED ORDER — ENOXAPARIN SODIUM 40 MG/0.4ML ~~LOC~~ SOLN
40.0000 mg | SUBCUTANEOUS | Status: DC
  Administered 2016-05-15 – 2016-05-17 (×3): 40 mg via SUBCUTANEOUS
  Filled 2016-05-15 (×3): qty 0.4

## 2016-05-15 MED ORDER — ALPRAZOLAM 0.5 MG PO TABS: 0.2500 mg | ORAL_TABLET | Freq: Two times a day (BID) | ORAL | Status: DC | PRN

## 2016-05-15 MED ORDER — ACETAMINOPHEN 325 MG PO TABS: 650.0000 mg | ORAL_TABLET | ORAL | Status: DC | PRN

## 2016-05-15 MED ORDER — SPIRONOLACTONE 25 MG PO TABS
25.0000 mg | ORAL_TABLET | Freq: Every day | ORAL | Status: DC
  Administered 2016-05-16 – 2016-05-18 (×3): 25 mg via ORAL
  Filled 2016-05-15 (×3): qty 1

## 2016-05-15 MED ORDER — CLOBETASOL PROPIONATE 0.05 % EX OINT
1.0000 "application " | TOPICAL_OINTMENT | Freq: Two times a day (BID) | CUTANEOUS | Status: DC
  Administered 2016-05-15 – 2016-05-17 (×5): 1 via TOPICAL
  Filled 2016-05-15 (×2): qty 15

## 2016-05-15 MED ORDER — PREDNISONE 10 MG PO TABS
10.0000 mg | ORAL_TABLET | Freq: Every day | ORAL | Status: DC
  Administered 2016-05-16 – 2016-05-18 (×3): 10 mg via ORAL
  Filled 2016-05-15 (×3): qty 1

## 2016-05-15 MED ORDER — SODIUM CHLORIDE 0.9 % IV SOLN
250.0000 mL | INTRAVENOUS | Status: DC | PRN
  Administered 2016-05-15: 250 mL via INTRAVENOUS

## 2016-05-15 MED ORDER — FUROSEMIDE 10 MG/ML IJ SOLN
40.0000 mg | Freq: Once | INTRAMUSCULAR | Status: AC
  Administered 2016-05-15: 40 mg via INTRAVENOUS
  Filled 2016-05-15: qty 4

## 2016-05-15 MED ORDER — HYDROXYZINE HCL 10 MG PO TABS: 25.0000 mg | ORAL_TABLET | Freq: Three times a day (TID) | ORAL | Status: DC | PRN

## 2016-05-15 MED ORDER — HYDRALAZINE HCL 20 MG/ML IJ SOLN
10.0000 mg | INTRAMUSCULAR | Status: DC | PRN
  Administered 2016-05-15: 10 mg via INTRAVENOUS
  Filled 2016-05-15: qty 1

## 2016-05-15 MED ORDER — INSULIN ASPART 100 UNIT/ML ~~LOC~~ SOLN
0.0000 [IU] | Freq: Three times a day (TID) | SUBCUTANEOUS | Status: DC
  Administered 2016-05-16: 3 [IU] via SUBCUTANEOUS
  Administered 2016-05-16: 5 [IU] via SUBCUTANEOUS
  Administered 2016-05-16: 9 [IU] via SUBCUTANEOUS
  Administered 2016-05-17: 3 [IU] via SUBCUTANEOUS
  Administered 2016-05-17 (×2): 9 [IU] via SUBCUTANEOUS
  Administered 2016-05-18: 3 [IU] via SUBCUTANEOUS
  Administered 2016-05-18: 2 [IU] via SUBCUTANEOUS

## 2016-05-15 MED ORDER — INSULIN GLARGINE 100 UNIT/ML ~~LOC~~ SOLN
30.0000 [IU] | Freq: Every day | SUBCUTANEOUS | Status: DC
  Administered 2016-05-15: 30 [IU] via SUBCUTANEOUS
  Filled 2016-05-15 (×4): qty 0.3

## 2016-05-15 MED ORDER — CEPHALEXIN 500 MG PO CAPS
500.0000 mg | ORAL_CAPSULE | Freq: Two times a day (BID) | ORAL | Status: DC
  Administered 2016-05-15 – 2016-05-18 (×6): 500 mg via ORAL
  Filled 2016-05-15 (×6): qty 1

## 2016-05-15 MED ORDER — ALBUTEROL SULFATE (2.5 MG/3ML) 0.083% IN NEBU: 5.0000 mg | INHALATION_SOLUTION | Freq: Once | RESPIRATORY_TRACT | Status: DC

## 2016-05-15 MED ORDER — ALBUTEROL SULFATE (2.5 MG/3ML) 0.083% IN NEBU: 2.5000 mg | INHALATION_SOLUTION | RESPIRATORY_TRACT | Status: DC | PRN

## 2016-05-15 MED ORDER — SODIUM CHLORIDE 0.9% FLUSH
3.0000 mL | Freq: Two times a day (BID) | INTRAVENOUS | Status: DC
  Administered 2016-05-15 – 2016-05-18 (×6): 3 mL via INTRAVENOUS

## 2016-05-15 NOTE — ED Triage Notes (Addendum)
Patient short of breath since Monday. Saw PCP yesterday and received breathing treatment. Felt she hade allergy to ACE inhibitors and Sulfa drugs. Wheezing on inspiration and expiration. Generalized rash over body for one month. Dermatologist could not determine cause. Symptoms progressively worsening since last night.l

## 2016-05-15 NOTE — ED Notes (Signed)
CRITICAL VALUE ALERT  Critical value received:  Troponin 0.04  Date of notification:  05/15/16  Time of notification:  [id number]  Critical value read back:Yes.    Nurse who received alert:  RMinter, RN  MD notified (1st page):  Dr. Laverta Baltimore  Time of first page:  1542  MD notified (2nd page):  Time of second page:  Responding MD:  Dr. Laverta Baltimore  Time MD responded:  [phone]

## 2016-05-15 NOTE — ED Provider Notes (Signed)
Emergency Department Provider Note   I have reviewed the triage vital signs and the nursing notes.   HISTORY  Chief Complaint Shortness of Breath   HPI ALFONSO SKY is a 82 y.o. female with PMH of colon cancer, DM, and HTN presents to the emergency department for violation of progressively worsening dyspnea. Symptoms have been ongoing for the last 3 days. She was seen by her primary care physician and prescribed albuterol. Family member at bedside states that she's noted wheezing throughout the week that is minimally responsive to albuterol. Patient has no prior history of wheezing or COPD. She denies associated chest pain, fevers or chills, productive cough. No known history of congestive heart failure. She has baseline lower extremity edema in the left leg and acute on chronic rash in the right. The rash on the right leg is been present for "months" and recently treated with antibiotics. Patient and family feel the rash is improved. Dyspnea is much worse with exertion. No significant change with position.    Past Medical History:  Diagnosis Date  . Anxiety   . Arthritis   . Cancer [facility]) 2006   colon  . Cataract   . Diabetes mellitus without complication ([place])   . Diverticulitis   . Hypertension     There are no active problems to display for this patient.   Past Surgical History:  Procedure Laterality Date  . ABDOMINAL HYSTERECTOMY    . APPENDECTOMY    . COLON SURGERY      Current Outpatient Rx  . Order #: [id number] Class: Historical Med  . Order #: [id number] Class: Historical Med  . Order #: [id number] Class: Historical Med  . Order #: [id number] Class: Historical Med  . Order #: [id number] Class: Print  . Order #: [id number] Class: Print  . Order #: [id number] Class: Print  . Order #: [id number] Class: Historical Med  . Order #: [id number] Class: Historical Med  . Order #: [id number] Class: Historical Med  . Order #: [id number] Class: Historical Med  . Order #: [id number] Class:  Historical Med  . Order #: [id number] Class: Normal    Allergies Ace inhibitors; Neosporin [neomycin-bacitracin zn-polymyx]; and Diovan [valsartan]  Family History  Problem Relation Age of Onset  . Stroke Mother   . Diabetes Father   . Cancer Sister   . Diabetes Sister   . Cancer Brother   . Cancer Brother   . Diabetes Sister   . Stroke Sister     Social History Social History  Substance Use Topics  . Smoking status: Never Smoker  . Smokeless tobacco: Not on file  . Alcohol use No    Review of Systems  Constitutional: No fever/chills Eyes: No visual changes. ENT: No sore throat. Cardiovascular: Denies chest pain. Respiratory: Positive shortness of breath. Gastrointestinal: No abdominal pain.  No nausea, no vomiting.  No diarrhea.  No constipation. Genitourinary: Negative for dysuria. Musculoskeletal: Negative for back pain. Skin: Negative for rash. Neurological: Negative for headaches, focal weakness or numbness.  10-point ROS otherwise negative.  ____________________________________________   PHYSICAL EXAM:  VITAL SIGNS: ED Triage Vitals  Enc Vitals Group     BP 05/15/16 1344 181/67     Pulse Rate 05/15/16 1344 69     Resp 05/15/16 1344 18     Temp 05/15/16 1344 98.1 F (36.7 C)     Temp Source 05/15/16 1344 Oral     SpO2 05/15/16 1344 (!) 88 %     Weight 05/15/16 1345 164 lb (74.4 kg)     Height 05/15/16 1345 5'  4" (1.626 m)   Constitutional: Alert and oriented. Notable increased WOB.  Eyes: Conjunctivae are normal.  Head: Atraumatic. Nose: No congestion/rhinnorhea. Mouth/Throat: Mucous membranes are moist.  Oropharynx non-erythematous. Neck: No stridor. Cardiovascular: Normal rate, regular rhythm. Good peripheral circulation. Grossly normal heart sounds.   Respiratory: Increased respiratory effort and tachypnea.  No retractions. Lungs with faint wheezing at the left base. Moderate air movement.  Gastrointestinal: Soft and nontender. No distention.    Musculoskeletal: No lower extremity tenderness. Positive LLE edema. No gross deformities of extremities. Neurologic:  Normal speech and language. No gross focal neurologic deficits are appreciated.  Skin:  Skin is warm, dry and intact. Linear rash overlying skin graft site with no ulceration or drainage.   ____________________________________________   LABS (all labs ordered are listed, but only abnormal results are displayed)  Labs Reviewed  CBC WITH DIFFERENTIAL/PLATELET - Abnormal; Notable for the following:       Result Value   WBC 13.9 (*)    RBC 3.73 (*)    Hemoglobin 10.9 (*)    HCT 32.9 (*)    Neutro Abs 12.2 (*)    All other components within normal limits  COMPREHENSIVE METABOLIC PANEL - Abnormal; Notable for the following:    Glucose, Bld 180 (*)    BUN 34 (*)    Calcium 8.1 (*)    Albumin 2.9 (*)    ALT 70 (*)    GFR calc non Af Amer 55 (*)    All other components within normal limits  TROPONIN I - Abnormal; Notable for the following:    Troponin I 0.04 (*)    All other components within normal limits  BRAIN NATRIURETIC PEPTIDE   ____________________________________________  EKG   EKG Interpretation  Date/Time:  Thursday May 15 2016 13:51:30 EDT Ventricular Rate:  69 PR Interval:    QRS Duration: 87 QT Interval:  404 QTC Calculation: 433 R Axis:   71 Text Interpretation:  Sinus rhythm Repol abnrm suggests ischemia, lateral leads No STEMI. Similar to prior.  Confirmed by Skiler Tye MD, Tatijana Bierly [phone]) on 05/15/2016 2:23:21 PM       ____________________________________________  RADIOLOGY  Dg Chest Port 1 View  Result Date: 05/15/2016 CLINICAL DATA:  Shortness of breath for several days EXAM: PORTABLE CHEST 1 VIEW COMPARISON:  02/05/2013 FINDINGS: Cardiac shadow is stable. Increased vascular congestion and interstitial edema is noted. Increased density is noted over the bases bilaterally likely related to a small posterior effusions. No bony abnormality  is noted. IMPRESSION: Changes consistent with CHF Electronically Signed   By: Inez Catalina M.D.   On: 05/15/2016 14:34    ____________________________________________   PROCEDURES  Procedure(s) performed:   Procedures  None ____________________________________________   INITIAL IMPRESSION / ASSESSMENT AND PLAN / ED COURSE  Pertinent labs & imaging results that were available during my care of the patient were reviewed by me and considered in my medical decision making (see chart for details).  Patient presents to the emergency room in for evaluation of progressively worsening dyspnea. She has very faint wheezing on exam. Appears to be moving good air. She has notable tachypnea and increased work of breathing. Differential is broad at this point. She has been using albuterol nebulizer for the last day with little relief. She has a new oxygen requirement here in the emergency department with room air oxygen saturation of 88%. Differential is broad and includes ACS, PE, PNA, or COPD.   03:46 PM Patient's troponin is slightly elevated at 0.04. I  have added aspirin to her medications. We'll page the hospitalist regarding admission for echocardiogram and enzyme trending. I feel that the troponin elevation is secondary to new onset congestive heart failure rather than a primary ischemic event. The patient has had several days of progressively worsening symptoms with no acute chest pain or other anginal equivalents.   04:18 PM Discussed patient's case with hospitalist, Dr. Tamala Julian.  Recommend admission to inpatient, telemetry bed.  I will place holding orders per their request. Patient and family (if present) updated with plan. Care transferred to hospitalist service.  I reviewed all nursing notes, vitals, pertinent old records, EKGs, labs, imaging (as available).  ____________________________________________  FINAL CLINICAL IMPRESSION(S) / ED DIAGNOSES  Final diagnoses:  SOB (shortness of  breath)     MEDICATIONS GIVEN DURING THIS VISIT:  Medications  aspirin chewable tablet 324 mg (not administered)  furosemide (LASIX) injection 40 mg (40 mg Intravenous Given 05/15/16 1511)     NEW OUTPATIENT MEDICATIONS STARTED DURING THIS VISIT:  None   Note:  This document was prepared using Dragon voice recognition software and may include unintentional dictation errors.  Nanda Quinton, MD Emergency Medicine   Margette Fast, MD 05/15/16 [phone]

## 2016-05-15 NOTE — H&P (Signed)
History and Physical    [name] [id number] DOB: 1930-08-28 DOA: 05/15/2016  Referring MD/NP/PA: Dr. Laverta Baltimore PCP: Wende Neighbors, MD  Patient coming from: Home  Chief Complaint: couldn't breathe  HPI: [name] is a 82 y.o. female with medical history significant of HTN, diabetes mellitus type 2, colon cancers/p resection and chemotherapy in 2006; who presents with complaints of shortness of breath. Patient notes difficulty breathing for the last 3-4 months, but progressively worse over last 3-4 days. Patient notes being significantly winded even walking a few feet. Associated symptoms include orthopnea patient props herself up on 4 pillows at night/sleeps in a recliner, leg swelling, chills, and wheezing. Denies any chest pain, nausea, vomiting, diarrhea, dysuria, or fever. Patient also notes having a rash over last few months as well for which she was seen previously by dermatologist. 4 days ago she was evaluated for the rash and they stopped her Mobic and Hydrouril thinking it may contributing to the rash. Since being on the prednisone and cephalexin symptoms have gradually improved. Patient also gives a history of colon cancer status post resection, but patient does not note any follow-up colonoscopies over the last 5 years.  ED Course:  Upon admission into the hospital patient was seen to be afebrile, respirations 17-27, BP up to 200/86, and o2 saturations 88-99%. Lab work WBC 13.9, Hbg 10.9, Plt 311, Na 140, K 3.9, Cl 110, CO2 24, BUN 34, Cr 0.94,Gluc 180, Trop 0.04, and BNP pending. CXR showing changes  Consistent with CHF. Patient was given 40 mg of lasix IV in the ED.   Review of Systems: As per HPI otherwise 10 point review of systems negative.   Past Medical History:  Diagnosis Date  . Anxiety   . Arthritis   . Cancer [facility]) 2006   colon  . Cataract   . Diabetes mellitus without complication ([place])   . Diverticulitis   . Hypertension     Past Surgical History:  Procedure  Laterality Date  . ABDOMINAL HYSTERECTOMY    . APPENDECTOMY    . COLON SURGERY       reports that she has never smoked. She does not have any smokeless tobacco history on file. She reports that she does not drink alcohol or use drugs.  Allergies  Allergen Reactions  . Ace Inhibitors Dermatitis  . Neosporin [Neomycin-Bacitracin Zn-Polymyx] Dermatitis  . Diovan [Valsartan] Rash    Family History  Problem Relation Age of Onset  . Stroke Mother   . Diabetes Father   . Cancer Sister   . Diabetes Sister   . Cancer Brother   . Cancer Brother   . Diabetes Sister   . Stroke Sister     Prior to Admission medications   Medication Sig Start Date End Date Taking? Authorizing Provider  albuterol (PROVENTIL) (2.5 MG/3ML) 0.083% nebulizer solution Take 2.5 mg by nebulization every 4 (four) hours as needed for wheezing or shortness of breath (use every 4 to 6 hrs as needed).   Yes Historical Provider, MD  ALPRAZolam (XANAX) 0.25 MG tablet Take 0.25 mg by mouth 2 (two) times daily as needed for anxiety.   Yes Historical Provider, MD  aspirin 81 MG tablet Take 81 mg by mouth daily.   Yes Historical Provider, MD  cephALEXin (KEFLEX) 500 MG capsule Take 1 capsule by mouth 2 (two) times daily. For 7 days 05/10/16  Yes Historical Provider, MD  clobetasol cream (TEMOVATE) [id number] % Apply 1 application topically 2 (two) times daily as  needed. Do not put on face, can only use for 2 weeks continuous at a time 03/17/15  Yes Thao P Le, DO  HYDROcodone-acetaminophen (NORCO/VICODIN) 5-325 MG per tablet Take 1 tablet by mouth every 4 (four) hours as needed for pain. 02/05/13  Yes Prentiss Bells, MD  hydrOXYzine (VISTARIL) 25 MG capsule Take 1 capsule (25 mg total) by mouth 3 (three) times daily as needed. For itchiness, can make you drowsy 03/17/15  Yes Thao P Le, DO  insulin glargine (LANTUS) 100 UNIT/ML injection Inject 80 Units into the skin at bedtime.    Yes Historical Provider, MD  labetalol (NORMODYNE) 200 MG  tablet Take 200 mg by mouth 2 (two) times daily.   Yes Historical Provider, MD  pravastatin (PRAVACHOL) 40 MG tablet Take 40 mg by mouth daily.   Yes Historical Provider, MD  predniSONE (DELTASONE) 10 MG tablet Take 1 tablet by mouth daily.  as directed dose pack 6 days 05/10/16  Yes Historical Provider, MD  spironolactone (ALDACTONE) 25 MG tablet Take 25 mg by mouth daily.   Yes Historical Provider, MD  triamcinolone cream (KENALOG) 0.1 % APPLY TWICE DAILY 01/13/12  Yes Rise Mu, PA-C    Physical Exam:  Constitutional: Elderly female who appears to be in some moderate respiratory distress. Vitals:   05/15/16 1400 05/15/16 1415 05/15/16 1430 05/15/16 1500  BP: 175/76  175/72 188/61  Pulse: 71 66 66 66  Resp: 24 19 24 19   Temp:      TempSrc:      SpO2: 96% 96% 98% 97%  Weight:      Height:       Eyes: PERRL, lids and conjunctivae normal ENMT: Mucous membranes are moist. Posterior pharynx clear of any exudate or lesions.Normal dentition.  Neck: normal, supple, no masses, no thyromegaly Respiratory: Tachypneic with bilateral wheezes and crackles appreciated. Patient documented short sentences. Cardiovascular: Regular rate and rhythm, no murmurs / rubs / gallops. No extremity edema. 2+ pedal pulses. No carotid bruits.  Abdomen: no tenderness, no masses palpated. No hepatosplenomegaly. Bowel sounds positive.  Musculoskeletal: no clubbing / cyanosis. No joint deformity upper and lower extremities. Good ROM, no contractures. Normal muscle tone.  Skin: Nonspecific scaly rash of the back and right lower extremity Neurologic: CN 2-12 grossly intact. Sensation intact, DTR normal. Strength 5/5 in all 4.  Psychiatric: Normal judgment and insight. Alert and oriented x 3. Normal mood.     Labs on Admission: I have personally reviewed following labs and imaging studies  CBC:  Recent Labs Lab 05/15/16 1355  WBC 13.9*  NEUTROABS 12.2*  HGB 10.9*  HCT 32.9*  MCV 88.2  PLT [id number]   Basic  Metabolic Panel:  Recent Labs Lab 05/15/16 1355  NA 140  K 3.9  CL 110  CO2 24  GLUCOSE 180*  BUN 34*  CREATININE 0.94  CALCIUM 8.1*   GFR: Estimated Creatinine Clearance: 44.8 mL/min (by C-G formula based on SCr of 0.94 mg/dL). Liver Function Tests:  Recent Labs Lab 05/15/16 1355  AST 41  ALT 70*  ALKPHOS 72  BILITOT 0.6  PROT 6.8  ALBUMIN 2.9*   No results for input(s): LIPASE, AMYLASE in the last 168 hours. No results for input(s): AMMONIA in the last 168 hours. Coagulation Profile: No results for input(s): INR, PROTIME in the last 168 hours. Cardiac Enzymes:  Recent Labs Lab 05/15/16 1404  TROPONINI 0.04*   BNP (last 3 results) No results for input(s): PROBNP in the last 8760 hours. HbA1C:  No results for input(s): HGBA1C in the last 72 hours. CBG: No results for input(s): GLUCAP in the last 168 hours. Lipid Profile: No results for input(s): CHOL, HDL, LDLCALC, TRIG, CHOLHDL, LDLDIRECT in the last 72 hours. Thyroid Function Tests: No results for input(s): TSH, T4TOTAL, FREET4, T3FREE, THYROIDAB in the last 72 hours. Anemia Panel: No results for input(s): VITAMINB12, FOLATE, FERRITIN, TIBC, IRON, RETICCTPCT in the last 72 hours. Urine analysis:    Component Value Date/Time   COLORURINE YELLOW 08/16/2010 2011   APPEARANCEUR HAZY (A) 08/16/2010 2011   LABSPEC 1.016 08/16/2010 2011   PHURINE 5.0 08/16/2010 2011   GLUCOSEU NEGATIVE 02/22/2008 2126   HGBUR NEGATIVE 08/16/2010 2011   [place] NEGATIVE 08/16/2010 2011   KETONESUR NEGATIVE 08/16/2010 2011   PROTEINUR NEGATIVE 08/16/2010 2011   UROBILINOGEN 0.2 08/16/2010 2011   NITRITE NEGATIVE 08/16/2010 2011   LEUKOCYTESUR MODERATE (A) 08/16/2010 2011   Sepsis Labs: No results found for this or any previous visit (from the past 240 hour(s)).   Radiological Exams on Admission: Dg Chest Port 1 View  Result Date: 05/15/2016 CLINICAL DATA:  Shortness of breath for several days EXAM: PORTABLE CHEST 1  VIEW COMPARISON:  02/05/2013 FINDINGS: Cardiac shadow is stable. Increased vascular congestion and interstitial edema is noted. Increased density is noted over the bases bilaterally likely related to a small posterior effusions. No bony abnormality is noted. IMPRESSION: Changes consistent with CHF Electronically Signed   By: Inez Catalina M.D.   On: 05/15/2016 14:34    EKG: Independently reviewed. Sinus rhythm with possible repolarization abnormalities  Assessment/Plan Acute congestive heart failure: Patient with shortness of breath and recent stoppage of diuretic over the weekend. Chest x-ray showing CHF. BNP elevated at 385. Patient given 40 mg Lasix in the ED. - Admit to a telemetry bed - Strict ins and outs - Lasix 40 mg IV every 12 hours - Check echocardiogram in a.m.  Acute respiratory failure with hypoxia: Acute. Patient not on oxygen at home, and requiring loosen nasal cannula to maintain O2 saturations. - Continuous pulse oximetry with nasal cannula oxygen to keep O2 sats greater than 92% - Albuterol nebulizer every 4 hours prn Shortness of breath or wheezing   Elevated troponin: Acute. Patient's initial troponin elevated 0.04. Patient denies any chest pain - Trend cardiac troponins  CAD - continue ASA  Rash: The patient has nonspecific rash that has erupted across her whole body while the last few months.  - Continue hydroxyzine prn itching, prednisone, cephalexin - Question further investigation into the possibility of underlying malignancy given previous history of colon cancer no reported recent follow.  Essential hypertension - Continue labetalol and spironolactone   Diabetes mellitus type 2 - Hypoglycemic protocols - Decreased home insulin glargine from 80 units subcutaneous daily at bedtime 30 units while hospitalized - CBGs every before meals with sensitive sliding scale insulin for now - Adjust insulinregimen as needed  Hyperlipidemia - Continue  pravastatin  Anxiety - Xanax prn  DVT prophylaxis:  Lovenox Code Status: Full Family Communication: Discussed overall care with the patient, her granddaughter,  and pastor Disposition Plan: Likely discharge home once medically stable Consults called: None Admission status: Inpatient  Norval Morton MD [facility] Pager [phone]  If 7PM-7AM, please contact night-coverage [email or web address] Password [email or web address]  05/15/2016, 4:18 PM

## 2016-05-16 ENCOUNTER — Inpatient Hospital Stay (HOSPITAL_COMMUNITY): Payer: Medicare Other

## 2016-05-16 DIAGNOSIS — J9601 Acute respiratory failure with hypoxia: Secondary | ICD-10-CM

## 2016-05-16 DIAGNOSIS — I509 Heart failure, unspecified: Secondary | ICD-10-CM

## 2016-05-16 LAB — BASIC METABOLIC PANEL
ANION GAP: 7 (ref 5–15)
BUN: 36 mg/dL — AB (ref 6–20)
CO2: 24 mmol/L (ref 22–32)
Calcium: 7.9 mg/dL — ABNORMAL LOW (ref 8.9–10.3)
Chloride: 104 mmol/L (ref 101–111)
Creatinine, Ser: 1.01 mg/dL — ABNORMAL HIGH (ref 0.44–1.00)
GFR calc non Af Amer: 50 mL/min — ABNORMAL LOW (ref >60)
GFR, EST AFRICAN AMERICAN: 58 mL/min — AB (ref >60)
Glucose, Bld: 279 mg/dL — ABNORMAL HIGH (ref 65–99)
POTASSIUM: 3.8 mmol/L (ref 3.5–5.1)
SODIUM: 135 mmol/L (ref 135–145)

## 2016-05-16 LAB — CBC WITH DIFFERENTIAL/PLATELET
BASOS ABS: 0 10*3/uL (ref 0.0–0.1)
Basophils Relative: 0 %
EOS PCT: 0 %
Eosinophils Absolute: 0 10*3/uL (ref 0.0–0.7)
HCT: 31.6 % — ABNORMAL LOW (ref 36.0–46.0)
HEMOGLOBIN: 10.5 g/dL — AB (ref 12.0–15.0)
LYMPHS PCT: 15 %
Lymphs Abs: 1.7 10*3/uL (ref 0.7–4.0)
MCH: 28.9 pg (ref 26.0–34.0)
MCHC: 33.2 g/dL (ref 30.0–36.0)
MCV: 87.1 fL (ref 78.0–100.0)
Monocytes Absolute: 0.6 10*3/uL (ref 0.1–1.0)
Monocytes Relative: 5 %
NEUTROS ABS: 8.8 10*3/uL — AB (ref 1.7–7.7)
NEUTROS PCT: 80 %
PLATELETS: 306 10*3/uL (ref 150–400)
RBC: 3.63 MIL/uL — AB (ref 3.87–5.11)
RDW: 14.2 % (ref 11.5–15.5)
WBC: 11.1 10*3/uL — AB (ref 4.0–10.5)

## 2016-05-16 LAB — GLUCOSE, CAPILLARY
GLUCOSE-CAPILLARY: 271 mg/dL — AB (ref 65–99)
Glucose-Capillary: 202 mg/dL — ABNORMAL HIGH (ref 65–99)
Glucose-Capillary: 381 mg/dL — ABNORMAL HIGH (ref 65–99)
Glucose-Capillary: 363 mg/dL — ABNORMAL HIGH (ref 65–99)

## 2016-05-16 LAB — TROPONIN I
TROPONIN I: 0.03 ng/mL — AB (ref <0.03)
Troponin I: 0.03 ng/mL (ref <0.03)

## 2016-05-16 LAB — MRSA PCR SCREENING: MRSA BY PCR: NEGATIVE

## 2016-05-16 LAB — ECHOCARDIOGRAM COMPLETE
Height: 63.5 in
WEIGHTICAEL: 2818.36 [oz_av]

## 2016-05-16 MED ORDER — INSULIN GLARGINE 100 UNIT/ML ~~LOC~~ SOLN
50.0000 [IU] | Freq: Once | SUBCUTANEOUS | Status: AC
  Administered 2016-05-16: 50 [IU] via SUBCUTANEOUS
  Filled 2016-05-16: qty 0.5

## 2016-05-16 MED ORDER — INSULIN ASPART 100 UNIT/ML ~~LOC~~ SOLN
5.0000 [IU] | Freq: Once | SUBCUTANEOUS | Status: AC
  Administered 2016-05-16: 5 [IU] via SUBCUTANEOUS

## 2016-05-16 MED ORDER — INSULIN GLARGINE 100 UNIT/ML ~~LOC~~ SOLN
50.0000 [IU] | Freq: Every day | SUBCUTANEOUS | Status: DC
  Filled 2016-05-16 (×3): qty 0.5

## 2016-05-16 NOTE — Progress Notes (Signed)
PROGRESS NOTE    [name]  [id number] DOB: 1930-09-07 DOA: 05/15/2016 PCP: Wende Neighbors, MD    Brief Narrative:  82 year-old woman with a hx of anxiety, HTN. DM, and colon cancer s/p chemotherapy, presents with complaints of shortness of breath, especially on exertion. While in the ED, she was noted to be hypoxic and hypertensive. Serology showed leukocytosis, an elevated BNP 385, and an elevated troponin 0.04. CXR shows findings consistent with congestive heart failure. She was started on lasix and admitted for further evaluation of congestive heart failure.   Assessment & Plan:   Principal Problem:   Congestive heart failure (CHF) (HCC) Active Problems:   Dyspnea   Rash and nonspecific skin eruption   Essential hypertension   Diabetes mellitus type 2 in obese (HCC)   Anxiety   Acute respiratory failure with hypoxia (HCC)  Acute on chronic congestive heart failure  -CXR is consistent with congestive heart failure.  -Elevated BNP at 385. -Continue IV lasix, monitor intake and output, is already 2.4 L negative. -Volume status is much improved and she is also clinically improved. -Check echocardiogram.   Acute respiratory failure with hypoxia  -Patient was noted to be hypoxic on arrival requiring 2L of supplemental oxygen - She is not on any home oxygen.  -Will continue nebulizer and supplemental oxygen.  -Likely due to above.  Elevated troponin  - Troponin was elevated at 0.04 on admission, now 0.03.  -Continue to monitor and trend troponin.  -Flat, no CO, no acute EKG changes. -Suspect minimal troponin elevation due to CHF and no further cardiac work up is planned for at present.  CAD  -Continue aspirin.   Rash  -Patient reported a rash that has erupted across her body for the past few months.  -Continue hydroxyzine, prednisone, and cephalexin.   HTN  -Fair control. -Continue labetalol and spironolactone   DM type 2 - Home insulin has been decreased from 80  units to 30 units while hospitalized.  -CBGs remain uncontrolled. -Will increase lantus to 50 units. -Continue sliding scale insulin.   HLD  -Continue statin.   Anxiety  -Continue xanax as needed.   DVT prophylaxis: Lovenox  Code Status: FULL Family Communication: patient only Disposition Plan: Discharge home once improved.    Consultants:   None   Procedures:   None   Antimicrobials:   None    Subjective: Feels much better, altho still SOB.  Objective: Vitals:   05/15/16 1857 05/15/16 2131 05/15/16 2330 05/16/16 0614  BP: (!) 146/55 (!) 185/64 (!) 154/54 (!) 158/78  Pulse: 79 71  63  Resp:  20  20  Temp:  98.1 F (36.7 C)  97.4 F (36.3 C)  TempSrc:  Oral  Oral  SpO2:  97%  94%  Weight:    79.9 kg (176 lb 2.4 oz)  Height:        Intake/Output Summary (Last 24 hours) at 05/16/16 0937 Last data filed at 05/16/16 0616  Gross per 24 hour  Intake           584.83 ml  Output             2225 ml  Net         -1640.17 ml   Filed Weights   05/15/16 1345 05/15/16 1722 05/16/16 0614  Weight: 74.4 kg (164 lb) 81.2 kg (179 lb 0.2 oz) 79.9 kg (176 lb 2.4 oz)    Examination:  General exam: Appears calm and comfortable  Respiratory system:  Clear to auscultation. Respiratory effort normal. Cardiovascular system: S1 & S2 heard, RRR. No JVD, murmurs, rubs, gallops or clicks. No pedal edema. Gastrointestinal system: Abdomen is nondistended, soft and nontender. No organomegaly or masses felt. Normal bowel sounds heard. Central nervous system: Alert and oriented. No focal neurological deficits. Extremities: 1+edema bilaterally, positive pulses Skin: No rashes, lesions or ulcers Psychiatry: Judgement and insight appear normal. Mood & affect appropriate.     Data Reviewed: I have personally reviewed following labs and imaging studies  CBC:  Recent Labs Lab 05/15/16 1355 05/16/16 0209  WBC 13.9* 11.1*  NEUTROABS 12.2* 8.8*  HGB 10.9* 10.5*  HCT 32.9* 31.6*    MCV 88.2 87.1  PLT 311 [id number]   Basic Metabolic Panel:  Recent Labs Lab 05/15/16 1355 05/16/16 0209  NA 140 135  K 3.9 3.8  CL 110 104  CO2 24 24  GLUCOSE 180* 279*  BUN 34* 36*  CREATININE 0.94 1.01*  CALCIUM 8.1* 7.9*   GFR: Estimated Creatinine Clearance: 42.7 mL/min (by C-G formula based on SCr of 1.01 mg/dL (H)). Liver Function Tests:  Recent Labs Lab 05/15/16 1355  AST 41  ALT 70*  ALKPHOS 72  BILITOT 0.6  PROT 6.8  ALBUMIN 2.9*   No results for input(s): LIPASE, AMYLASE in the last 168 hours. No results for input(s): AMMONIA in the last 168 hours. Coagulation Profile: No results for input(s): INR, PROTIME in the last 168 hours. Cardiac Enzymes:  Recent Labs Lab 05/15/16 1404 05/15/16 1950 05/16/16 0209 05/16/16 0747  TROPONINI 0.04* 0.03* 0.03* 0.03*   BNP (last 3 results) No results for input(s): PROBNP in the last 8760 hours. HbA1C: No results for input(s): HGBA1C in the last 72 hours. CBG:  Recent Labs Lab 05/15/16 2222 05/15/16 2336 05/16/16 0805  GLUCAP 375* 341* 202*   Lipid Profile: No results for input(s): CHOL, HDL, LDLCALC, TRIG, CHOLHDL, LDLDIRECT in the last 72 hours. Thyroid Function Tests: No results for input(s): TSH, T4TOTAL, FREET4, T3FREE, THYROIDAB in the last 72 hours. Anemia Panel: No results for input(s): VITAMINB12, FOLATE, FERRITIN, TIBC, IRON, RETICCTPCT in the last 72 hours. Sepsis Labs: No results for input(s): PROCALCITON, LATICACIDVEN in the last 168 hours.  No results found for this or any previous visit (from the past 240 hour(s)).       Radiology Studies: Dg Chest Port 1 View  Result Date: 05/15/2016 CLINICAL DATA:  Shortness of breath for several days EXAM: PORTABLE CHEST 1 VIEW COMPARISON:  02/05/2013 FINDINGS: Cardiac shadow is stable. Increased vascular congestion and interstitial edema is noted. Increased density is noted over the bases bilaterally likely related to a small posterior effusions. No  bony abnormality is noted. IMPRESSION: Changes consistent with CHF Electronically Signed   By: Inez Catalina M.D.   On: 05/15/2016 14:34        Scheduled Meds: . aspirin EC  81 mg Oral Daily  . cephALEXin  500 mg Oral BID  . clobetasol ointment  1 application Topical BID  . enoxaparin (LOVENOX) injection  40 mg Subcutaneous Q24H  . furosemide  40 mg Intravenous Q12H  . insulin aspart  0-9 Units Subcutaneous TID WC  . insulin glargine  30 Units Subcutaneous QHS  . labetalol  200 mg Oral BID  . pravastatin  40 mg Oral Daily  . predniSONE  10 mg Oral Daily  . sodium chloride flush  3 mL Intravenous Q12H  . spironolactone  25 mg Oral Daily   Continuous Infusions:    LOS:  1 day    Time spent: 25 minutes     Domingo Mend, MD [facility] If 7PM-7AM, please contact night-coverage [email or web address] Password [email or web address] 05/16/2016, 9:37 AM   By signing my name below, I, Collene Leyden, attest that this documentation has been prepared under the direction and in the presence of Domingo Mend MD. Electronically signed: Collene Leyden, Scribe. 05/16/16]   I have reviewed the above documentation for accuracy and completeness, and I agree with the above.  Domingo Mend, MD [facility] Pager: [phone]

## 2016-05-16 NOTE — Progress Notes (Signed)
*  PRELIMINARY RESULTS* Echocardiogram 2D Echocardiogram has been performed.  [name] 05/16/2016, 1:58 PM

## 2016-05-17 DIAGNOSIS — I5033 Acute on chronic diastolic (congestive) heart failure: Secondary | ICD-10-CM

## 2016-05-17 LAB — GLUCOSE, CAPILLARY
GLUCOSE-CAPILLARY: 219 mg/dL — AB (ref 65–99)
GLUCOSE-CAPILLARY: 356 mg/dL — AB (ref 65–99)
GLUCOSE-CAPILLARY: 413 mg/dL — AB (ref 65–99)
Glucose-Capillary: 203 mg/dL — ABNORMAL HIGH (ref 65–99)

## 2016-05-17 LAB — GLUCOSE, RANDOM: Glucose, Bld: 389 mg/dL — ABNORMAL HIGH (ref 65–99)

## 2016-05-17 LAB — BASIC METABOLIC PANEL
ANION GAP: 10 (ref 5–15)
BUN: 41 mg/dL — ABNORMAL HIGH (ref 6–20)
CHLORIDE: 99 mmol/L — AB (ref 101–111)
CO2: 27 mmol/L (ref 22–32)
Calcium: 8.6 mg/dL — ABNORMAL LOW (ref 8.9–10.3)
Creatinine, Ser: 1.2 mg/dL — ABNORMAL HIGH (ref 0.44–1.00)
GFR, EST AFRICAN AMERICAN: 47 mL/min — AB (ref >60)
GFR, EST NON AFRICAN AMERICAN: 41 mL/min — AB (ref >60)
Glucose, Bld: 182 mg/dL — ABNORMAL HIGH (ref 65–99)
POTASSIUM: 3.4 mmol/L — AB (ref 3.5–5.1)
SODIUM: 136 mmol/L (ref 135–145)

## 2016-05-17 MED ORDER — INSULIN GLARGINE 100 UNIT/ML ~~LOC~~ SOLN
65.0000 [IU] | Freq: Every day | SUBCUTANEOUS | Status: DC
  Administered 2016-05-17: 65 [IU] via SUBCUTANEOUS
  Filled 2016-05-17 (×2): qty 0.65

## 2016-05-17 MED ORDER — FUROSEMIDE 40 MG PO TABS
40.0000 mg | ORAL_TABLET | Freq: Every day | ORAL | Status: DC
  Administered 2016-05-17 – 2016-05-18 (×2): 40 mg via ORAL
  Filled 2016-05-17 (×2): qty 1

## 2016-05-17 NOTE — Progress Notes (Signed)
Pt's CBG 413 at this time.  Dr. Jerilee Hoh paged and made aware.

## 2016-05-17 NOTE — Progress Notes (Signed)
Pt had 5 beat run vtach, MD made aware, will continue to monitor.

## 2016-05-17 NOTE — Progress Notes (Signed)
PROGRESS NOTE    [name]  [id number] DOB: 1930/10/31 DOA: 05/15/2016 PCP: Wende Neighbors, MD    Brief Narrative:  82 year-old woman with a hx of anxiety, HTN. DM, and colon cancer s/p chemotherapy, presents with complaints of shortness of breath, especially on exertion. While in the ED, she was noted to be hypoxic and hypertensive. Serology showed leukocytosis, an elevated BNP 385, and an elevated troponin 0.04. CXR shows findings consistent with congestive heart failure. She was started on lasix and admitted for further evaluation of congestive heart failure.   Assessment & Plan:   Principal Problem:   Congestive heart failure (CHF) (HCC) Active Problems:   Dyspnea   Rash and nonspecific skin eruption   Essential hypertension   Diabetes mellitus type 2 in obese (HCC)   Anxiety   Acute respiratory failure with hypoxia (HCC)  Acute on chronic congestive heart failure  -CXR is consistent with congestive heart failure.  -Elevated BNP at 385. -Is 6.5 L negative since admission. -Volume status is much improved and she is also clinically improved. -Will change diuretics to lasix 40 mg PO daily. -ECHO: EF 123456 grade 2 diastolic dysfunction  Acute respiratory failure with hypoxia  -Patient was noted to be hypoxic on arrival requiring 2L of supplemental oxygen - She is not on any home oxygen.  -Will continue nebulizer and supplemental oxygen.  -Likely due to above. -Has resolved with diuresis.  Elevated troponin  - Troponin was elevated at 0.04 on admission, now 0.03.  -Continue to monitor and trend troponin.  -Flat, no CO, no acute EKG changes. -Suspect minimal troponin elevation due to CHF and no further cardiac work up is planned for at present.  CAD  -Continue aspirin.   Rash  -Patient reported a rash that has erupted across her body for the past few months.  -Continue hydroxyzine, prednisone, and cephalexin.   HTN  -Fair control. -Continue labetalol and  spironolactone   DM type 2 - Home insulin has been decreased from 80 units to 30 units while hospitalized.  -CBGs remain uncontrolled. -Will increase lantus to 65 units. -Continue sliding scale insulin.   HLD  -Continue statin.   Anxiety  -Continue xanax as needed.   DVT prophylaxis: Lovenox  Code Status: FULL Family Communication: patient only Disposition Plan: Discharge home once improved.    Consultants:   None   Procedures:   None   Antimicrobials:   None    Subjective: Feels much better, altho still SOB.  Objective: Vitals:   05/16/16 2107 05/16/16 2147 05/17/16 0500 05/17/16 1000  BP: (!) 204/69 (!) 178/68 (!) 186/75 108/68  Pulse: 69  62 (!) 59  Resp: 20  20 20   Temp: 98.5 F (36.9 C)  98.6 F (37 C) 98.8 F (37.1 C)  TempSrc: Oral  Oral Oral  SpO2: 99%  96% 93%  Weight:   76.9 kg (169 lb 8.5 oz)   Height:        Intake/Output Summary (Last 24 hours) at 05/17/16 1337 Last data filed at 05/17/16 1331  Gross per 24 hour  Intake              483 ml  Output             4650 ml  Net            -4167 ml   Filed Weights   05/15/16 1722 05/16/16 0614 05/17/16 0500  Weight: 81.2 kg (179 lb 0.2 oz) 79.9 kg (176 lb  2.4 oz) 76.9 kg (169 lb 8.5 oz)    Examination:  General exam: Appears calm and comfortable  Respiratory system: Clear to auscultation. Respiratory effort normal. Cardiovascular system: S1 & S2 heard, RRR. No JVD, murmurs, rubs, gallops or clicks. No pedal edema. Gastrointestinal system: Abdomen is nondistended, soft and nontender. No organomegaly or masses felt. Normal bowel sounds heard. Central nervous system: Alert and oriented. No focal neurological deficits. Extremities: 1+edema bilaterally, positive pulses Skin: No rashes, lesions or ulcers Psychiatry: Judgement and insight appear normal. Mood & affect appropriate.     Data Reviewed: I have personally reviewed following labs and imaging studies  CBC:  Recent Labs Lab  05/15/16 1355 05/16/16 0209  WBC 13.9* 11.1*  NEUTROABS 12.2* 8.8*  HGB 10.9* 10.5*  HCT 32.9* 31.6*  MCV 88.2 87.1  PLT 311 [id number]   Basic Metabolic Panel:  Recent Labs Lab 05/15/16 1355 05/16/16 0209 05/17/16 0602  NA 140 135 136  K 3.9 3.8 3.4*  CL 110 104 99*  CO2 24 24 27   GLUCOSE 180* 279* 182*  BUN 34* 36* 41*  CREATININE 0.94 1.01* 1.20*  CALCIUM 8.1* 7.9* 8.6*   GFR: Estimated Creatinine Clearance: 35.3 mL/min (by C-G formula based on SCr of 1.2 mg/dL (H)). Liver Function Tests:  Recent Labs Lab 05/15/16 1355  AST 41  ALT 70*  ALKPHOS 72  BILITOT 0.6  PROT 6.8  ALBUMIN 2.9*   No results for input(s): LIPASE, AMYLASE in the last 168 hours. No results for input(s): AMMONIA in the last 168 hours. Coagulation Profile: No results for input(s): INR, PROTIME in the last 168 hours. Cardiac Enzymes:  Recent Labs Lab 05/15/16 1404 05/15/16 1950 05/16/16 0209 05/16/16 0747  TROPONINI 0.04* 0.03* 0.03* 0.03*   BNP (last 3 results) No results for input(s): PROBNP in the last 8760 hours. HbA1C: No results for input(s): HGBA1C in the last 72 hours. CBG:  Recent Labs Lab 05/16/16 1110 05/16/16 1623 05/16/16 2111 05/17/16 0731 05/17/16 1136  GLUCAP 271* 381* 363* 219* 356*   Lipid Profile: No results for input(s): CHOL, HDL, LDLCALC, TRIG, CHOLHDL, LDLDIRECT in the last 72 hours. Thyroid Function Tests: No results for input(s): TSH, T4TOTAL, FREET4, T3FREE, THYROIDAB in the last 72 hours. Anemia Panel: No results for input(s): VITAMINB12, FOLATE, FERRITIN, TIBC, IRON, RETICCTPCT in the last 72 hours. Sepsis Labs: No results for input(s): PROCALCITON, LATICACIDVEN in the last 168 hours.  Recent Results (from the past 240 hour(s))  MRSA PCR Screening     Status: None   Collection Time: 05/16/16  8:35 AM  Result Value Ref Range Status   MRSA by PCR NEGATIVE NEGATIVE Final    Comment:        The GeneXpert MRSA Assay (FDA approved for NASAL  specimens only), is one component of a comprehensive MRSA colonization surveillance program. It is not intended to diagnose MRSA infection nor to guide or monitor treatment for MRSA infections.          Radiology Studies: Dg Chest Port 1 View  Result Date: 05/15/2016 CLINICAL DATA:  Shortness of breath for several days EXAM: PORTABLE CHEST 1 VIEW COMPARISON:  02/05/2013 FINDINGS: Cardiac shadow is stable. Increased vascular congestion and interstitial edema is noted. Increased density is noted over the bases bilaterally likely related to a small posterior effusions. No bony abnormality is noted. IMPRESSION: Changes consistent with CHF Electronically Signed   By: Inez Catalina M.D.   On: 05/15/2016 14:34        Scheduled  Meds: . aspirin EC  81 mg Oral Daily  . cephALEXin  500 mg Oral BID  . clobetasol ointment  1 application Topical BID  . enoxaparin (LOVENOX) injection  40 mg Subcutaneous Q24H  . furosemide  40 mg Oral Daily  . insulin aspart  0-9 Units Subcutaneous TID WC  . labetalol  200 mg Oral BID  . pravastatin  40 mg Oral Daily  . predniSONE  10 mg Oral Daily  . sodium chloride flush  3 mL Intravenous Q12H  . spironolactone  25 mg Oral Daily   Continuous Infusions:    LOS: 2 days    Time spent: 25 minutes     Domingo Mend, MD Pager: [phone] [facility] If 7PM-7AM, please contact night-coverage [email or web address] Password [email or web address] 05/17/2016, 1:37 PM

## 2016-05-18 DIAGNOSIS — I5030 Unspecified diastolic (congestive) heart failure: Secondary | ICD-10-CM

## 2016-05-18 DIAGNOSIS — I10 Essential (primary) hypertension: Secondary | ICD-10-CM

## 2016-05-18 LAB — BASIC METABOLIC PANEL
ANION GAP: 8 (ref 5–15)
BUN: 46 mg/dL — ABNORMAL HIGH (ref 6–20)
CALCIUM: 8.8 mg/dL — AB (ref 8.9–10.3)
CO2: 28 mmol/L (ref 22–32)
CREATININE: 1.09 mg/dL — AB (ref 0.44–1.00)
Chloride: 100 mmol/L — ABNORMAL LOW (ref 101–111)
GFR calc non Af Amer: 46 mL/min — ABNORMAL LOW (ref >60)
GFR, EST AFRICAN AMERICAN: 53 mL/min — AB (ref >60)
Glucose, Bld: 159 mg/dL — ABNORMAL HIGH (ref 65–99)
Potassium: 3.7 mmol/L (ref 3.5–5.1)
SODIUM: 136 mmol/L (ref 135–145)

## 2016-05-18 LAB — GLUCOSE, CAPILLARY
GLUCOSE-CAPILLARY: 161 mg/dL — AB (ref 65–99)
Glucose-Capillary: 243 mg/dL — ABNORMAL HIGH (ref 65–99)

## 2016-05-18 MED ORDER — FUROSEMIDE 40 MG PO TABS: 40.0000 mg | ORAL_TABLET | Freq: Every day | ORAL | 2 refills | Status: DC

## 2016-05-18 NOTE — Discharge Summary (Addendum)
Physician Discharge Summary  [name] [id number] DOB: 28-Jun-1931 DOA: 05/15/2016  PCP: Wende Neighbors, MD  Admit date: 05/15/2016 Discharge date: 05/18/2016  Time spent: 45 minutes  Recommendations for Outpatient Follow-up:  -Will be discharged home today. -Advised to follow up with PCP in 2 weeks.   Discharge Diagnoses:  Principal Problem:   Congestive heart failure (CHF) (HCC) Active Problems:   Dyspnea   Rash and nonspecific skin eruption   Essential hypertension   Diabetes mellitus type 2 in obese [facility])   Anxiety   Acute respiratory failure with hypoxia [facility])   Discharge Condition: Stable and improved  Filed Weights   05/16/16 0614 05/17/16 0500 05/18/16 0508  Weight: 79.9 kg (176 lb 2.4 oz) 76.9 kg (169 lb 8.5 oz) 75.2 kg (165 lb 11.2 oz)    History of present illness:  As per Dr. Tamala Julian on 11/2:  [name] is a 82 y.o. female with medical history significant of HTN, diabetes mellitus type 2, colon cancers/p resection and chemotherapy in 2006; who presents with complaints of shortness of breath. Patient notes difficulty breathing for the last 3-4 months, but progressively worse over last 3-4 days. Patient notes being significantly winded even walking a few feet. Associated symptoms include orthopnea patient props herself up on 4 pillows at night/sleeps in a recliner, leg swelling, chills, and wheezing. Denies any chest pain, nausea, vomiting, diarrhea, dysuria, or fever. Patient also notes having a rash over last few months as well for which she was seen previously by dermatologist. 4 days ago she was evaluated for the rash and they stopped her Mobic and Hydrouril thinking it may contributing to the rash. Since being on the prednisone and cephalexin symptoms have gradually improved. Patient also gives a history of colon cancer status post resection, but patient does not note any follow-up colonoscopies over the last 5 years.  ED Course:  Upon admission into the  hospital patient was seen to be afebrile, respirations 17-27, BP up to 200/86, and o2 saturations 88-99%. Lab work WBC 13.9, Hbg 10.9, Plt 311, Na 140, K 3.9, Cl 110, CO2 24, BUN 34, Cr 0.94,Gluc 180, Trop 0.04, and BNP pending. CXR showing changes  Consistent with CHF. Patient was given 40 mg of lasix IV in the ED.    Hospital Course:   Acute on chronic diastolic congestive heart failure  -CXR is consistent with congestive heart failure.  -Is 8.8 L negative since admission. -Volume status is much improved and she is also clinically improved. -Diuresed well overnight on PO lasix. -ECHO: EF 123456 grade 2 diastolic dysfunction  Acute respiratory failure with hypoxia  -Patient was noted to be hypoxic on arrival requiring 2L of supplemental oxygen - She is not on any home oxygen.  -Will continue nebulizer and supplemental oxygen.  -Likely due to above. -Has resolved with diuresis.  Elevated troponin  - Troponin was elevated at 0.04 on admission, now 0.03.  -Continue to monitor and trend troponin.  -Flat, no CO, no acute EKG changes. -Suspect minimal troponin elevation due to CHF and no further cardiac work up is planned for at present.  CAD  -Continue aspirin.   Rash  -Patient reported a rash that has erupted across her body for the past few months.  -Continue hydroxyzine, prednisone, and cephalexin.   HTN  -Fair control. -Continue labetalol and spironolactone   DM type 2 - Continue home dose.  HLD  -Continue statin.   Anxiety  -Continue xanax as needed.  Procedures:  None   Consultations:  None  Discharge Instructions  Discharge Instructions    Diet - low sodium heart healthy    Complete by:  As directed    Increase activity slowly    Complete by:  As directed        Medication List    STOP taking these medications   cephALEXin 500 MG capsule Commonly known as:  KEFLEX     TAKE these medications   albuterol (2.5 MG/3ML) 0.083% nebulizer  solution Commonly known as:  PROVENTIL Take 2.5 mg by nebulization every 4 (four) hours as needed for wheezing or shortness of breath (use every 4 to 6 hrs as needed).   ALPRAZolam 0.25 MG tablet Commonly known as:  XANAX Take 0.25 mg by mouth 2 (two) times daily as needed for anxiety.   aspirin 81 MG tablet Take 81 mg by mouth daily.   clobetasol cream 0.05 % Commonly known as:  TEMOVATE Apply 1 application topically 2 (two) times daily as needed. Do not put on face, can only use for 2 weeks continuous at a time   furosemide 40 MG tablet Commonly known as:  LASIX Take 1 tablet (40 mg total) by mouth daily. Start taking on:  05/19/2016   HYDROcodone-acetaminophen 5-325 MG tablet Commonly known as:  NORCO/VICODIN Take 1 tablet by mouth every 4 (four) hours as needed for pain.   hydrOXYzine 25 MG capsule Commonly known as:  VISTARIL Take 1 capsule (25 mg total) by mouth 3 (three) times daily as needed. For itchiness, can make you drowsy   insulin glargine 100 UNIT/ML injection Commonly known as:  LANTUS Inject 80 Units into the skin at bedtime.   labetalol 200 MG tablet Commonly known as:  NORMODYNE Take 200 mg by mouth 2 (two) times daily.   pravastatin 40 MG tablet Commonly known as:  PRAVACHOL Take 40 mg by mouth daily.   predniSONE 10 MG tablet Commonly known as:  DELTASONE Take 1 tablet by mouth daily.  as directed dose pack 6 days   spironolactone 25 MG tablet Commonly known as:  ALDACTONE Take 25 mg by mouth daily.   triamcinolone cream 0.1 % Commonly known as:  KENALOG APPLY TWICE DAILY      Allergies  Allergen Reactions  . Ace Inhibitors Dermatitis  . Neosporin [Neomycin-Bacitracin Zn-Polymyx] Dermatitis  . Diovan [Valsartan] Rash      The results of significant diagnostics from this hospitalization (including imaging, microbiology, ancillary and laboratory) are listed below for reference.    Significant Diagnostic Studies: Dg Chest Port 1  View  Result Date: 05/15/2016 CLINICAL DATA:  Shortness of breath for several days EXAM: PORTABLE CHEST 1 VIEW COMPARISON:  02/05/2013 FINDINGS: Cardiac shadow is stable. Increased vascular congestion and interstitial edema is noted. Increased density is noted over the bases bilaterally likely related to a small posterior effusions. No bony abnormality is noted. IMPRESSION: Changes consistent with CHF Electronically Signed   By: Inez Catalina M.D.   On: 05/15/2016 14:34    Microbiology: Recent Results (from the past 240 hour(s))  MRSA PCR Screening     Status: None   Collection Time: 05/16/16  8:35 AM  Result Value Ref Range Status   MRSA by PCR NEGATIVE NEGATIVE Final    Comment:        The GeneXpert MRSA Assay (FDA approved for NASAL specimens only), is one component of a comprehensive MRSA colonization surveillance program. It is not intended to diagnose MRSA infection nor to  guide or monitor treatment for MRSA infections.      Labs: Basic Metabolic Panel:  Recent Labs Lab 05/15/16 1355 05/16/16 0209 05/17/16 0602 05/17/16 1727 05/18/16 0551  NA 140 135 136  --  136  K 3.9 3.8 3.4*  --  3.7  CL 110 104 99*  --  100*  CO2 24 24 27   --  28  GLUCOSE 180* 279* 182* 389* 159*  BUN 34* 36* 41*  --  46*  CREATININE 0.94 1.01* 1.20*  --  1.09*  CALCIUM 8.1* 7.9* 8.6*  --  8.8*   Liver Function Tests:  Recent Labs Lab 05/15/16 1355  AST 41  ALT 70*  ALKPHOS 72  BILITOT 0.6  PROT 6.8  ALBUMIN 2.9*   No results for input(s): LIPASE, AMYLASE in the last 168 hours. No results for input(s): AMMONIA in the last 168 hours. CBC:  Recent Labs Lab 05/15/16 1355 05/16/16 0209  WBC 13.9* 11.1*  NEUTROABS 12.2* 8.8*  HGB 10.9* 10.5*  HCT 32.9* 31.6*  MCV 88.2 87.1  PLT 311 306   Cardiac Enzymes:  Recent Labs Lab 05/15/16 1404 05/15/16 1950 05/16/16 0209 05/16/16 0747  TROPONINI 0.04* 0.03* 0.03* 0.03*   BNP: BNP (last 3 results)  Recent Labs   05/15/16 1404  BNP 385.0*    ProBNP (last 3 results) No results for input(s): PROBNP in the last 8760 hours.  CBG:  Recent Labs Lab 05/17/16 0731 05/17/16 1136 05/17/16 1624 05/17/16 2117 05/18/16 0813  GLUCAP 219* 356* 413* 203* 161*       Signed:  HERNANDEZ ACOSTA,Adream Parzych  [facility] Pager: [phone] 05/18/2016, 11:14 AM

## 2016-05-18 NOTE — Progress Notes (Signed)
Pt discharged home today per Dr. Hernandez. Pt's IV site D/C'd and WDL. Pt's VSS. Pt provided with home medication list, discharge instructions and prescriptions. Verbalized understanding. Pt left floor via WC in stable condition accompanied by NT. 

## 2016-06-13 DIAGNOSIS — J45998 Other asthma: Secondary | ICD-10-CM | POA: Diagnosis not present

## 2016-06-13 DIAGNOSIS — J708 Respiratory conditions due to other specified external agents: Secondary | ICD-10-CM | POA: Diagnosis not present

## 2016-06-17 ENCOUNTER — Emergency Department (HOSPITAL_COMMUNITY): Payer: Medicare Other

## 2016-06-17 ENCOUNTER — Encounter (HOSPITAL_COMMUNITY): Payer: Self-pay | Admitting: *Deleted

## 2016-06-17 ENCOUNTER — Emergency Department (HOSPITAL_COMMUNITY)
Admission: EM | Admit: 2016-06-17 | Discharge: 2016-06-18 | Disposition: A | Discharge status: Home / Self Care | Payer: Medicare Other | Attending: Emergency Medicine | Admitting: Emergency Medicine

## 2016-06-17 DIAGNOSIS — Z7982 Long term (current) use of aspirin: Secondary | ICD-10-CM | POA: Insufficient documentation

## 2016-06-17 DIAGNOSIS — I110 Hypertensive heart disease with heart failure: Secondary | ICD-10-CM | POA: Diagnosis not present

## 2016-06-17 DIAGNOSIS — E1165 Type 2 diabetes mellitus with hyperglycemia: Secondary | ICD-10-CM | POA: Diagnosis not present

## 2016-06-17 DIAGNOSIS — Z85038 Personal history of other malignant neoplasm of large intestine: Secondary | ICD-10-CM | POA: Insufficient documentation

## 2016-06-17 DIAGNOSIS — R590 Localized enlarged lymph nodes: Secondary | ICD-10-CM | POA: Diagnosis not present

## 2016-06-17 DIAGNOSIS — K5790 Diverticulosis of intestine, part unspecified, without perforation or abscess without bleeding: Secondary | ICD-10-CM | POA: Diagnosis not present

## 2016-06-17 DIAGNOSIS — R1031 Right lower quadrant pain: Secondary | ICD-10-CM | POA: Diagnosis not present

## 2016-06-17 DIAGNOSIS — Z794 Long term (current) use of insulin: Secondary | ICD-10-CM | POA: Insufficient documentation

## 2016-06-17 DIAGNOSIS — L048 Acute lymphadenitis of other sites: Secondary | ICD-10-CM | POA: Insufficient documentation

## 2016-06-17 DIAGNOSIS — I889 Nonspecific lymphadenitis, unspecified: Secondary | ICD-10-CM

## 2016-06-17 DIAGNOSIS — I509 Heart failure, unspecified: Secondary | ICD-10-CM | POA: Insufficient documentation

## 2016-06-17 DIAGNOSIS — R739 Hyperglycemia, unspecified: Secondary | ICD-10-CM

## 2016-06-17 DIAGNOSIS — Z792 Long term (current) use of antibiotics: Secondary | ICD-10-CM | POA: Diagnosis not present

## 2016-06-17 DIAGNOSIS — Z79899 Other long term (current) drug therapy: Secondary | ICD-10-CM | POA: Insufficient documentation

## 2016-06-17 LAB — CBC
HCT: 36.5 % (ref 36.0–46.0)
HEMOGLOBIN: 12 g/dL (ref 12.0–15.0)
MCH: 29.1 pg (ref 26.0–34.0)
MCHC: 32.9 g/dL (ref 30.0–36.0)
MCV: 88.4 fL (ref 78.0–100.0)
Platelets: 253 10*3/uL (ref 150–400)
RBC: 4.13 MIL/uL (ref 3.87–5.11)
RDW: 13.5 % (ref 11.5–15.5)
WBC: 7.5 10*3/uL (ref 4.0–10.5)

## 2016-06-17 LAB — COMPREHENSIVE METABOLIC PANEL
ALT: 20 U/L (ref 14–54)
ANION GAP: 13 (ref 5–15)
AST: 26 U/L (ref 15–41)
Albumin: 3.2 g/dL — ABNORMAL LOW (ref 3.5–5.0)
Alkaline Phosphatase: 57 U/L (ref 38–126)
BUN: 32 mg/dL — ABNORMAL HIGH (ref 6–20)
CHLORIDE: 96 mmol/L — AB (ref 101–111)
CO2: 24 mmol/L (ref 22–32)
Calcium: 8.4 mg/dL — ABNORMAL LOW (ref 8.9–10.3)
Creatinine, Ser: 1.56 mg/dL — ABNORMAL HIGH (ref 0.44–1.00)
GFR, EST AFRICAN AMERICAN: 34 mL/min — AB (ref >60)
GFR, EST NON AFRICAN AMERICAN: 30 mL/min — AB (ref >60)
Glucose, Bld: 529 mg/dL (ref 65–99)
Potassium: 4.3 mmol/L (ref 3.5–5.1)
SODIUM: 133 mmol/L — AB (ref 135–145)
Total Bilirubin: 0.4 mg/dL (ref 0.3–1.2)
Total Protein: 6.9 g/dL (ref 6.5–8.1)

## 2016-06-17 LAB — CBG MONITORING, ED: GLUCOSE-CAPILLARY: 419 mg/dL — AB (ref 65–99)

## 2016-06-17 LAB — LIPASE, BLOOD: LIPASE: 17 U/L (ref 11–51)

## 2016-06-17 MED ORDER — INSULIN REGULAR HUMAN 100 UNIT/ML IJ SOLN
INTRAMUSCULAR | Status: AC
  Filled 2016-06-17: qty 2.5

## 2016-06-17 MED ORDER — SODIUM CHLORIDE 0.9 % IV BOLUS (SEPSIS)
1000.0000 mL | Freq: Once | INTRAVENOUS | Status: AC
  Administered 2016-06-17: 1000 mL via INTRAVENOUS

## 2016-06-17 MED ORDER — ONDANSETRON HCL 4 MG/2ML IJ SOLN
4.0000 mg | Freq: Once | INTRAMUSCULAR | Status: AC
  Administered 2016-06-17: 4 mg via INTRAVENOUS
  Filled 2016-06-17: qty 2

## 2016-06-17 MED ORDER — IOPAMIDOL (ISOVUE-300) INJECTION 61%
INTRAVENOUS | Status: AC
  Filled 2016-06-17: qty 50

## 2016-06-17 MED ORDER — INSULIN REGULAR HUMAN 100 UNIT/ML IJ SOLN
INTRAMUSCULAR | Status: DC
  Administered 2016-06-17: 3.6 [IU]/h via INTRAVENOUS
  Filled 2016-06-17: qty 2.5

## 2016-06-17 MED ORDER — FENTANYL CITRATE (PF) 100 MCG/2ML IJ SOLN
50.0000 ug | Freq: Once | INTRAMUSCULAR | Status: AC
  Administered 2016-06-17: 50 ug via INTRAVENOUS
  Filled 2016-06-17: qty 2

## 2016-06-17 NOTE — ED Provider Notes (Signed)
[facility] DEPT Provider Note   [name]: [id number] Arrival date & time: 06/17/16  2120  By signing my name below, I, [name], attest that this documentation has been prepared under the direction and in the presence of physician practitioner, Kerri Porter, MD. Electronically Signed: Dora [name], Scribe. 06/17/2016. 11:08 PM.  Time seen 23:08 PM  History   Chief Complaint Chief Complaint  Patient presents with  . Abdominal Pain    The history is provided by the patient. No language interpreter was used.     HPI Comments: [name] is a 82 y.o. female with PMHx significant for colon cancer, diverticulitis, and DM who presents to the Emergency Department complaining of sudden onset, intermittent, worsening, stabbing, RLQ abdominal pain beginning today. She states her pain became constant around 5 PM. She reports some pain alleviation with pressure to her abdomen and pain exacerbation with certain positions and movements. She denies experiencing similar abdominal pain in the past. She notes a PSHx of bowel resection, appendectomy, and abdominal hysterectomy. Pt notes she has been having regular bowel movements and has had 3 today. She reports a subjective fever this morning but states she does not feel febrile currently. Pt uses insulin for diabetes and notes she measured her blood sugar at 109 this morning. She reports urinary frequency for the past 3 months but denies any acute urinary change today. She lives alone. She denies nausea, vomiting, diarrhea, dysuria, or any other associated symptoms.  She reports she's had a rash for a few months and the dermatologist cannot figure out what the cause is. Her niece states that if she takes steroids it goes away however it then comes back.  PCP Dr [name]  Past Medical History:  Diagnosis Date  . Anxiety   . Arthritis   . Cancer [facility]) 2006   colon  . Cataract   . Diabetes mellitus without complication ([employer])   . Diverticulitis   .  Hypertension     Patient Active Problem List   Diagnosis Date Noted  . Dyspnea 05/15/2016  . Congestive heart failure (CHF) ([place]) 05/15/2016  . Rash and nonspecific skin eruption 05/15/2016  . Essential hypertension 05/15/2016  . Diabetes mellitus type 2 in obese ([place]) 05/15/2016  . Anxiety 05/15/2016  . Acute respiratory failure with hypoxia ([place]) 05/15/2016    Past Surgical History:  Procedure Laterality Date  . ABDOMINAL HYSTERECTOMY    . APPENDECTOMY    . COLON SURGERY      OB History    No data available       Home Medications    Prior to Admission medications   Medication Sig Start Date End Date Taking? Authorizing Provider  albuterol (PROVENTIL) (2.5 MG/3ML) 0.083% nebulizer solution Take 2.5 mg by nebulization every 4 (four) hours as needed for wheezing or shortness of breath (use every 4 to 6 hrs as needed).    Historical Provider, MD  ALPRAZolam [name]) 0.25 MG tablet Take 0.25 mg by mouth 2 (two) times daily as needed for anxiety.    Historical Provider, MD  aspirin 81 MG tablet Take 81 mg by mouth daily.    Historical Provider, MD  cephALEXin (KEFLEX) 500 MG capsule Take 1 capsule (500 mg total) by mouth 3 (three) times daily. 06/18/16   Kerri Porter, MD  clobetasol cream (TEMOVATE) [id number] % Apply 1 application topically 2 (two) times daily as needed. Do not put on face, can only use for 2 weeks continuous at a time 03/17/15   [facility]  P Le, DO  dicyclomine (BENTYL) 20 MG tablet Take 1 tablet (20 mg total) by mouth 4 (four) times daily -  before meals and at bedtime. 06/18/16   Kerri Porter, MD  furosemide (LASIX) 40 MG tablet Take 1 tablet (40 mg total) by mouth daily. 05/19/16   Kerri Hau, MD  HYDROcodone-acetaminophen (NORCO/VICODIN) 5-325 MG per tablet Take 1 tablet by mouth every 4 (four) hours as needed for pain. 02/05/13   Kerri Bells, MD  hydrOXYzine (VISTARIL) 25 MG capsule Take 1 capsule (25 mg total) by mouth 3 (three) times daily as needed. For  itchiness, can make you drowsy 03/17/15   Kerri P Le, DO  insulin glargine (LANTUS) 100 UNIT/ML injection Inject 80 Units into the skin at bedtime.     Historical Provider, MD  labetalol (NORMODYNE) 200 MG tablet Take 200 mg by mouth 2 (two) times daily.    Historical Provider, MD  pravastatin (PRAVACHOL) 40 MG tablet Take 40 mg by mouth daily.    Historical Provider, MD  predniSONE (DELTASONE) 10 MG tablet Take 1 tablet by mouth daily.  as directed dose pack 6 days 05/10/16   Historical Provider, MD  spironolactone (ALDACTONE) 25 MG tablet Take 25 mg by mouth daily.    Historical Provider, MD  triamcinolone cream (KENALOG) 0.1 % APPLY TWICE DAILY 01/13/12   Kerri Mu, PA-C    Family History Family History  Problem Relation Age of Onset  . Stroke Mother   . Diabetes Father   . Cancer Sister   . Diabetes Sister   . Cancer Brother   . Cancer Brother   . Diabetes Sister   . Stroke Sister     Social History Social History  Substance Use Topics  . Smoking status: Never Smoker  . Smokeless tobacco: Never Used  . Alcohol use No  lives at home  Lives alone   Allergies   Ace inhibitors; Neosporin [neomycin-bacitracin zn-polymyx]; and Diovan [valsartan]   Review of Systems Review of Systems  Constitutional: Positive for fever (subjective, resolved).  Gastrointestinal: Positive for abdominal pain (RLQ). Negative for diarrhea, nausea and vomiting.  Genitourinary: Positive for frequency (for 3 months, no acute change). Negative for dysuria.  All other systems reviewed and are negative.    Physical Exam Updated Vital Signs BP 170/62   Pulse 79   Temp 98.5 F (36.9 C) (Oral)   Resp 17   Ht 5\' 4"  (1.626 m)   Wt 162 lb (73.5 kg)   SpO2 99%   BMI 27.81 kg/m   Vital signs normal    Physical Exam  Constitutional: She is oriented to person, place, and time. She appears well-developed and well-nourished.  Non-toxic appearance. She does not appear ill. No distress.  HENT:  Head:  Normocephalic and atraumatic.  Right Ear: External ear normal.  Left Ear: External ear normal.  Nose: Nose normal. No mucosal edema or rhinorrhea.  Mouth/Throat: Mucous membranes are normal. No dental abscesses or uvula swelling.  Edentulous with dry mucous membranes.  Eyes: Conjunctivae and EOM are normal. Pupils are equal, round, and reactive to light.  Neck: Normal range of motion and full passive range of motion without pain. Neck supple.  Cardiovascular: Normal rate, regular rhythm and normal heart sounds.  Exam reveals no gallop and no friction rub.   No murmur heard. Pulmonary/Chest: Effort normal and breath sounds normal. No respiratory distress. She has no wheezes. She has no rhonchi. She has no rales. She exhibits no  tenderness and no crepitus.  Abdominal: Soft. Normal appearance and bowel sounds are normal. She exhibits no distension. There is tenderness. There is no rebound and no guarding.  Tender in the RLQ.  Musculoskeletal: Normal range of motion. She exhibits no edema or tenderness.  Moves all extremities well.   Neurological: She is alert and oriented to person, place, and time. She has normal strength. No cranial nerve deficit.  Skin: Skin is warm, dry and intact. No rash noted. There is erythema. No pallor.  Diffuse redness with some scaliness of both forearms and both thighs. She has scabbed areas on her lower leg where she has been scratching  Psychiatric: Her speech is normal and behavior is normal. Her mood appears anxious.  Nursing note and vitals reviewed.    ED Treatments / Results  Labs (all labs ordered are listed, but only abnormal results are displayed) Results for orders placed or performed during the hospital encounter of 06/17/16  Lipase, blood  Result Value Ref Range   Lipase 17 11 - 51 U/L  Comprehensive metabolic panel  Result Value Ref Range   Sodium 133 (L) 135 - 145 mmol/L   Potassium 4.3 3.5 - 5.1 mmol/L   Chloride 96 (L) 101 - 111 mmol/L    CO2 24 22 - 32 mmol/L   Glucose, Bld 529 (HH) 65 - 99 mg/dL   BUN 32 (H) 6 - 20 mg/dL   Creatinine, Ser 1.56 (H) 0.44 - 1.00 mg/dL   Calcium 8.4 (L) 8.9 - 10.3 mg/dL   Total Protein 6.9 6.5 - 8.1 g/dL   Albumin 3.2 (L) 3.5 - 5.0 g/dL   AST 26 15 - 41 U/L   ALT 20 14 - 54 U/L   Alkaline Phosphatase 57 38 - 126 U/L   Total Bilirubin 0.4 0.3 - 1.2 mg/dL   GFR calc non Af Amer 30 (L) >60 mL/min   GFR calc Af Amer 34 (L) >60 mL/min   Anion gap 13 5 - 15  CBC  Result Value Ref Range   WBC 7.5 4.0 - 10.5 K/uL   RBC 4.13 3.87 - 5.11 MIL/uL   Hemoglobin 12.0 12.0 - 15.0 g/dL   HCT 36.5 36.0 - 46.0 %   MCV 88.4 78.0 - 100.0 fL   MCH 29.1 26.0 - 34.0 pg   MCHC 32.9 30.0 - 36.0 g/dL   RDW 13.5 11.5 - 15.5 %   Platelets 253 150 - 400 K/uL  Urinalysis, Routine w reflex microscopic  Result Value Ref Range   Color, Urine YELLOW YELLOW   APPearance CLEAR CLEAR   Specific Gravity, Urine 1.015 1.005 - 1.030   pH 5.5 5.0 - 8.0   Glucose, UA >1000 (A) NEGATIVE mg/dL   Hgb urine dipstick NEGATIVE NEGATIVE   Bilirubin Urine NEGATIVE NEGATIVE   Ketones, ur NEGATIVE NEGATIVE mg/dL   Protein, ur NEGATIVE NEGATIVE mg/dL   Nitrite NEGATIVE NEGATIVE   Leukocytes, UA TRACE (A) NEGATIVE  Urinalysis, Microscopic (reflex)  Result Value Ref Range   RBC / HPF NONE SEEN 0 - 5 RBC/hpf   WBC, UA 6-30 0 - 5 WBC/hpf   Bacteria, UA FEW (A) NONE SEEN   Squamous Epithelial / LPF 6-30 (A) NONE SEEN  CBG monitoring, ED  Result Value Ref Range   Glucose-Capillary 419 (H) 65 - 99 mg/dL  CBG monitoring, ED  Result Value Ref Range   Glucose-Capillary 303 (H) 65 - 99 mg/dL  CBG monitoring, ED  Result Value Ref Range  Glucose-Capillary 182 (H) 65 - 99 mg/dL   Laboratory interpretation all normal except hyperglycemia without acidosis, elevated BUN consistent with dehydration, renal insufficiency that is stable    EKG  EKG Interpretation None       Radiology Ct Abdomen Pelvis Wo Contrast  Result  Date: 06/18/2016 CLINICAL DATA:  Acute onset intermittent worsening stabbing RIGHT lower quadrant pain today. Pain is now constant, worse with certain positions. History of colon cancer and bowel resection, diverticulitis, appendectomy, hysterectomy and diabetes. EXAM: CT ABDOMEN AND PELVIS WITHOUT CONTRAST TECHNIQUE: Multidetector CT imaging of the abdomen and pelvis was performed following the standard protocol without IV contrast. Oral contrast administered. COMPARISON:  CT abdomen and pelvis Dec 03, 2012 FINDINGS: LOWER CHEST: Lung bases are clear. The visualized heart size is normal. No pericardial effusion. HEPATOBILIARY: Punctate hepatic granulomas, liver is otherwise unremarkable. Normal gallbladder. PANCREAS: Normal. SPLEEN: Normal. ADRENALS/URINARY TRACT: Kidneys are orthotopic, demonstrating normal size and morphology. 2 mm RIGHT lower pole nephrolithiasis. No hydronephrosis; limited assessment for renal masses on this nonenhanced examination. The unopacified ureters are normal in course and caliber. Urinary bladder is partially distended and unremarkable. Normal adrenal glands. STOMACH/BOWEL: Status post sigmoidectomy with rectosigmoid anastomosis. Moderate diverticulosis. Small hiatal hernia. The stomach, small bowel are normal in course and caliber without inflammatory changes, sensitivity decreased by lack of enteric contrast. VASCULAR/LYMPHATIC: Aortoiliac vessels are normal in course and caliber, mild calcific atherosclerosis. RIGHT greater than LEFT inguinal lymphadenopathy measuring up to 14 mm short access with subcutaneous fat stranding. Multiple lymph nodes along the external iliac chains bilaterally measuring up to 10 mm with mild inflammation. REPRODUCTIVE: Status post hysterectomy. Unchanged sub cm calcification at vaginal cuff. OTHER: No intraperitoneal free fluid or free air. MUSCULOSKELETAL: Non-acute. Small fat containing umbilical hernia. Bridging RIGHT sacroiliac osteophyte. No acute  osseous process. Degenerative cervical spine results in severe canal stenosis L3-4 with near complete obliteration of the spinal canal. IMPRESSION: Inguinal lymphadenopathy with inflammation, though possibly infectious or inflammatory, metastatic disease is a concern given patient's history of colon cancer. Alternatively, lymphoproliferative disease could have this appearance. Consider PET-CT or sampling. Rectosigmoid anastomosis and moderate colonic diverticulosis without acute intra-abdominal/pelvic process. Degenerative lumbar spine with severe canal stenosis L3-4, near complete obliteration of spinal canal. Electronically Signed   By: Elon Alas M.D.   On: 06/18/2016 02:25    Procedures Procedures (including critical care time)  Medications Ordered in ED Medications  insulin regular (NOVOLIN R,HUMULIN R) 250 Units in sodium chloride 0.9 % 250 mL (1 Units/mL) infusion ( Intravenous Stopped 06/18/16 0220)  iopamidol (ISOVUE-300) 61 % injection (not administered)  sodium chloride 0.9 % bolus 1,000 mL (0 mLs Intravenous Stopped 06/18/16 0111)  fentaNYL (SUBLIMAZE) injection 50 mcg (50 mcg Intravenous Given 06/17/16 2333)  ondansetron (ZOFRAN) injection 4 mg (4 mg Intravenous Given 06/17/16 2333)  dicyclomine (BENTYL) injection 10 mg (10 mg Intramuscular Given 06/18/16 0325)     Initial Impression / Assessment and Plan / ED Course  I have reviewed the triage vital signs and the nursing notes.  Pertinent labs & imaging results that were available during my care of the patient were reviewed by me and considered in my medical decision making (see chart for details).  Clinical Course    DIAGNOSTIC STUDIES: Oxygen Saturation is 99% on RA, normal by my interpretation.    COORDINATION OF CARE: 11:20 PM Discussed treatment plan with pt at bedside and pt agreed to plan.Patient was given IV fluids and IV pain and nausea medication. Due to her  hyperglycemia she was started on an insulin  drip.  Patient's glucose continued to improve and her insulin drip was stopped.  We discussed patient's CT scan test results at 3 AM. She states her pain is almost gone. Patient is noted to have diffuse redness of her lower extremities with some scabs were she's been scratching. I suspect her inguinal lymphadenopathy is from these skin lesions. We discussed going home with antibiotics. She was given Bentyl IM to see if that would help with her abdominal discomfort.  At time of discharge patient states the Bentyl has helped her discomfort. She was discharged home with Bentyl and antibiotics. She should follow-up with her PCP in the next week.    Final Clinical Impressions(s) / ED Diagnoses   Final diagnoses:  RLQ abdominal pain  Inguinal lymphadenitis  Hyperglycemia    New Prescriptions New Prescriptions   CEPHALEXIN (KEFLEX) 500 MG CAPSULE    Take 1 capsule (500 mg total) by mouth 3 (three) times daily.   DICYCLOMINE (BENTYL) 20 MG TABLET    Take 1 tablet (20 mg total) by mouth 4 (four) times daily -  before meals and at bedtime.   Plan discharge  Kerri Porter, MD, FACEP  I personally performed the services described in this documentation, which was scribed in my presence. The recorded information has been reviewed and considered.  Kerri Porter, MD, Barbette Or, MD 06/18/16 [phone]

## 2016-06-17 NOTE — ED Triage Notes (Signed)
Pt reports lower back pain x 2-3 days. Pt does report urinary frequency but no dysuria no n/v.Pt reports that since 5pm, she has been having RLQ pain. Pt states she wasn't able to hold her urine until she got to the bathroom.

## 2016-06-17 NOTE — ED Notes (Signed)
CRITICAL VALUE ALERT  Critical value received:  Glucose = 529  Date of notification:  06/17/2016  Time of notification:  [id number]  Critical value read back:Yes.    Nurse who received alert:  Rosealee Albee  MD notified (1st page):  Rolland Porter  Time of first page:  1057  MD notified (2nd page):  Time of second page:  Responding MD:  Rolland Porter  Time MD responded:  1057

## 2016-06-17 NOTE — ED Notes (Signed)
Barnett Applebaum, RN, [facility] notified - need insulin drip for pt

## 2016-06-18 DIAGNOSIS — K5790 Diverticulosis of intestine, part unspecified, without perforation or abscess without bleeding: Secondary | ICD-10-CM | POA: Diagnosis not present

## 2016-06-18 LAB — URINALYSIS, ROUTINE W REFLEX MICROSCOPIC
Bilirubin Urine: NEGATIVE
Glucose, UA: >1000 mg/dL — AB
Hgb urine dipstick: NEGATIVE
Ketones, ur: NEGATIVE mg/dL
Nitrite: NEGATIVE
PROTEIN: NEGATIVE mg/dL
Specific Gravity, Urine: 1.015 (ref 1.005–1.030)
pH: 5.5 (ref 5.0–8.0)

## 2016-06-18 LAB — URINALYSIS, MICROSCOPIC (REFLEX): RBC / HPF: NONE SEEN RBC/hpf (ref 0–5)

## 2016-06-18 LAB — CBG MONITORING, ED
Glucose-Capillary: 303 mg/dL — ABNORMAL HIGH (ref 65–99)
Glucose-Capillary: 182 mg/dL — ABNORMAL HIGH (ref 65–99)

## 2016-06-18 MED ORDER — DICYCLOMINE HCL 10 MG/ML IM SOLN
10.0000 mg | Freq: Once | INTRAMUSCULAR | Status: AC
  Administered 2016-06-18: 10 mg via INTRAMUSCULAR
  Filled 2016-06-18: qty 2

## 2016-06-18 MED ORDER — DICYCLOMINE HCL 20 MG PO TABS: 20.0000 mg | ORAL_TABLET | Freq: Three times a day (TID) | ORAL | 0 refills | Status: DC

## 2016-06-18 MED ORDER — CEPHALEXIN 500 MG PO CAPS: 500.0000 mg | ORAL_CAPSULE | Freq: Three times a day (TID) | ORAL | 0 refills | Status: DC

## 2016-06-18 NOTE — Discharge Instructions (Signed)
Drink a lot of liquids. Avoid fried, spicy or greasy foods and start with a bland diet later today such as toast, jello, crackers, [employer] Nood[employer] soup. Take the bentyl for your stomach pain. You have swollen lymph nodes in your groin. Take the antibiotics until gone. Have Dr Nevada Crane recheck you soon in the office.

## 2016-06-18 NOTE — ED Notes (Signed)
ED Provider at bedside. 

## 2016-06-20 DIAGNOSIS — E1165 Type 2 diabetes mellitus with hyperglycemia: Secondary | ICD-10-CM | POA: Diagnosis not present

## 2016-06-20 DIAGNOSIS — R1031 Right lower quadrant pain: Secondary | ICD-10-CM | POA: Diagnosis not present

## 2016-06-20 DIAGNOSIS — R21 Rash and other nonspecific skin eruption: Secondary | ICD-10-CM | POA: Diagnosis not present

## 2016-06-24 ENCOUNTER — Other Ambulatory Visit (HOSPITAL_COMMUNITY): Payer: Self-pay | Admitting: Adult Health Nurse Practitioner

## 2016-06-24 DIAGNOSIS — Z85038 Personal history of other malignant neoplasm of large intestine: Secondary | ICD-10-CM

## 2016-06-24 DIAGNOSIS — K6389 Other specified diseases of intestine: Secondary | ICD-10-CM

## 2016-07-11 ENCOUNTER — Encounter (HOSPITAL_COMMUNITY)
Admission: RE | Admit: 2016-07-11 | Discharge: 2016-07-11 | Disposition: A | Discharge status: Home / Self Care | Payer: Medicare Other | Source: Ambulatory Visit | Attending: Adult Health Nurse Practitioner | Admitting: Adult Health Nurse Practitioner

## 2016-07-11 DIAGNOSIS — K639 Disease of intestine, unspecified: Secondary | ICD-10-CM | POA: Diagnosis not present

## 2016-07-11 DIAGNOSIS — Z85038 Personal history of other malignant neoplasm of large intestine: Secondary | ICD-10-CM | POA: Diagnosis not present

## 2016-07-11 DIAGNOSIS — K6389 Other specified diseases of intestine: Secondary | ICD-10-CM | POA: Diagnosis not present

## 2016-07-11 LAB — GLUCOSE, CAPILLARY: GLUCOSE-CAPILLARY: 93 mg/dL (ref 65–99)

## 2016-07-11 MED ORDER — FLUDEOXYGLUCOSE F - 18 (FDG) INJECTION: 8.0000 | Freq: Once | INTRAVENOUS | Status: DC | PRN

## 2016-07-14 DIAGNOSIS — J708 Respiratory conditions due to other specified external agents: Secondary | ICD-10-CM | POA: Diagnosis not present

## 2016-07-14 DIAGNOSIS — J45998 Other asthma: Secondary | ICD-10-CM | POA: Diagnosis not present

## 2016-07-25 DIAGNOSIS — E1165 Type 2 diabetes mellitus with hyperglycemia: Secondary | ICD-10-CM | POA: Diagnosis not present

## 2016-07-25 DIAGNOSIS — Z0000 Encounter for general adult medical examination without abnormal findings: Secondary | ICD-10-CM | POA: Diagnosis not present

## 2016-07-25 DIAGNOSIS — N183 Chronic kidney disease, stage 3 (moderate): Secondary | ICD-10-CM | POA: Diagnosis not present

## 2016-08-07 ENCOUNTER — Other Ambulatory Visit (HOSPITAL_COMMUNITY): Payer: Self-pay | Admitting: Internal Medicine

## 2016-08-07 DIAGNOSIS — R591 Generalized enlarged lymph nodes: Secondary | ICD-10-CM

## 2016-08-14 DIAGNOSIS — J708 Respiratory conditions due to other specified external agents: Secondary | ICD-10-CM | POA: Diagnosis not present

## 2016-08-14 DIAGNOSIS — J45998 Other asthma: Secondary | ICD-10-CM | POA: Diagnosis not present

## 2016-08-15 ENCOUNTER — Other Ambulatory Visit: Payer: Self-pay | Admitting: Physician Assistant

## 2016-08-15 ENCOUNTER — Encounter: Payer: Self-pay | Admitting: Internal Medicine

## 2016-08-15 DIAGNOSIS — R5383 Other fatigue: Secondary | ICD-10-CM | POA: Diagnosis not present

## 2016-08-15 DIAGNOSIS — R251 Tremor, unspecified: Secondary | ICD-10-CM | POA: Diagnosis not present

## 2016-08-15 DIAGNOSIS — L309 Dermatitis, unspecified: Secondary | ICD-10-CM | POA: Diagnosis not present

## 2016-08-15 DIAGNOSIS — L659 Nonscarring hair loss, unspecified: Secondary | ICD-10-CM | POA: Diagnosis not present

## 2016-08-18 ENCOUNTER — Ambulatory Visit (HOSPITAL_COMMUNITY): Payer: Medicare Other

## 2016-08-18 ENCOUNTER — Ambulatory Visit (HOSPITAL_COMMUNITY): Admission: RE | Admit: 2016-08-18 | Payer: Medicare Other | Source: Ambulatory Visit

## 2016-08-18 DIAGNOSIS — Z79899 Other long term (current) drug therapy: Secondary | ICD-10-CM | POA: Diagnosis not present

## 2016-08-18 DIAGNOSIS — L400 Psoriasis vulgaris: Secondary | ICD-10-CM | POA: Diagnosis not present

## 2016-08-18 DIAGNOSIS — L218 Other seborrheic dermatitis: Secondary | ICD-10-CM | POA: Diagnosis not present

## 2016-08-18 DIAGNOSIS — Z5181 Encounter for therapeutic drug level monitoring: Secondary | ICD-10-CM | POA: Diagnosis not present

## 2016-08-18 DIAGNOSIS — L539 Erythematous condition, unspecified: Secondary | ICD-10-CM | POA: Diagnosis not present

## 2016-08-29 ENCOUNTER — Other Ambulatory Visit: Payer: Self-pay | Admitting: Radiology

## 2016-08-31 ENCOUNTER — Observation Stay (HOSPITAL_COMMUNITY)
Admission: EM | Admit: 2016-08-31 | Discharge: 2016-09-02 | Disposition: A | Discharge status: Home / Self Care | Payer: Medicare Other | Attending: Internal Medicine | Admitting: Internal Medicine

## 2016-08-31 ENCOUNTER — Emergency Department (HOSPITAL_COMMUNITY): Payer: Medicare Other

## 2016-08-31 ENCOUNTER — Encounter (HOSPITAL_COMMUNITY): Payer: Self-pay

## 2016-08-31 DIAGNOSIS — Z79899 Other long term (current) drug therapy: Secondary | ICD-10-CM | POA: Insufficient documentation

## 2016-08-31 DIAGNOSIS — R1110 Vomiting, unspecified: Secondary | ICD-10-CM | POA: Diagnosis not present

## 2016-08-31 DIAGNOSIS — E119 Type 2 diabetes mellitus without complications: Secondary | ICD-10-CM | POA: Insufficient documentation

## 2016-08-31 DIAGNOSIS — E1165 Type 2 diabetes mellitus with hyperglycemia: Secondary | ICD-10-CM | POA: Diagnosis present

## 2016-08-31 DIAGNOSIS — I110 Hypertensive heart disease with heart failure: Secondary | ICD-10-CM | POA: Insufficient documentation

## 2016-08-31 DIAGNOSIS — R42 Dizziness and giddiness: Secondary | ICD-10-CM | POA: Diagnosis not present

## 2016-08-31 DIAGNOSIS — L400 Psoriasis vulgaris: Secondary | ICD-10-CM | POA: Diagnosis present

## 2016-08-31 DIAGNOSIS — R531 Weakness: Secondary | ICD-10-CM | POA: Diagnosis not present

## 2016-08-31 DIAGNOSIS — E1169 Type 2 diabetes mellitus with other specified complication: Secondary | ICD-10-CM | POA: Diagnosis present

## 2016-08-31 DIAGNOSIS — Z85038 Personal history of other malignant neoplasm of large intestine: Secondary | ICD-10-CM | POA: Diagnosis not present

## 2016-08-31 DIAGNOSIS — Z794 Long term (current) use of insulin: Secondary | ICD-10-CM | POA: Diagnosis not present

## 2016-08-31 DIAGNOSIS — E669 Obesity, unspecified: Secondary | ICD-10-CM

## 2016-08-31 DIAGNOSIS — M25551 Pain in right hip: Secondary | ICD-10-CM | POA: Diagnosis present

## 2016-08-31 DIAGNOSIS — S0990XA Unspecified injury of head, initial encounter: Secondary | ICD-10-CM | POA: Diagnosis not present

## 2016-08-31 DIAGNOSIS — S79911A Unspecified injury of right hip, initial encounter: Secondary | ICD-10-CM | POA: Diagnosis not present

## 2016-08-31 DIAGNOSIS — E11621 Type 2 diabetes mellitus with foot ulcer: Secondary | ICD-10-CM | POA: Diagnosis present

## 2016-08-31 DIAGNOSIS — I509 Heart failure, unspecified: Secondary | ICD-10-CM | POA: Diagnosis not present

## 2016-08-31 DIAGNOSIS — L97509 Non-pressure chronic ulcer of other part of unspecified foot with unspecified severity: Secondary | ICD-10-CM

## 2016-08-31 DIAGNOSIS — R2681 Unsteadiness on feet: Secondary | ICD-10-CM

## 2016-08-31 DIAGNOSIS — S299XXA Unspecified injury of thorax, initial encounter: Secondary | ICD-10-CM | POA: Diagnosis not present

## 2016-08-31 HISTORY — DX: Heart failure, unspecified: I50.9

## 2016-08-31 HISTORY — DX: Seborrheic infantile dermatitis: L21.1

## 2016-08-31 LAB — CBC WITH DIFFERENTIAL/PLATELET
BASOS ABS: 0 10*3/uL (ref 0.0–0.1)
BASOS PCT: 0 %
EOS ABS: 0.1 10*3/uL (ref 0.0–0.7)
EOS PCT: 1 %
HCT: 35.4 % — ABNORMAL LOW (ref 36.0–46.0)
Hemoglobin: 11.8 g/dL — ABNORMAL LOW (ref 12.0–15.0)
Lymphocytes Relative: 6 %
Lymphs Abs: 1 10*3/uL (ref 0.7–4.0)
MCH: 28.4 pg (ref 26.0–34.0)
MCHC: 33.3 g/dL (ref 30.0–36.0)
MCV: 85.3 fL (ref 78.0–100.0)
MONO ABS: 1.4 10*3/uL — AB (ref 0.1–1.0)
Monocytes Relative: 8 %
Neutro Abs: 14.5 10*3/uL — ABNORMAL HIGH (ref 1.7–7.7)
Neutrophils Relative %: 85 %
PLATELETS: 368 10*3/uL (ref 150–400)
RBC: 4.15 MIL/uL (ref 3.87–5.11)
RDW: 14.8 % (ref 11.5–15.5)
WBC: 17.1 10*3/uL — ABNORMAL HIGH (ref 4.0–10.5)

## 2016-08-31 LAB — TROPONIN I: Troponin I: <0.03 ng/mL (ref <0.03)

## 2016-08-31 LAB — COMPREHENSIVE METABOLIC PANEL
ALBUMIN: 3.2 g/dL — AB (ref 3.5–5.0)
ALK PHOS: 61 U/L (ref 38–126)
ALT: 17 U/L (ref 14–54)
ANION GAP: 10 (ref 5–15)
AST: 18 U/L (ref 15–41)
BILIRUBIN TOTAL: 0.6 mg/dL (ref 0.3–1.2)
BUN: 42 mg/dL — AB (ref 6–20)
CALCIUM: 8.9 mg/dL (ref 8.9–10.3)
CO2: 22 mmol/L (ref 22–32)
CREATININE: 1.45 mg/dL — AB (ref 0.44–1.00)
Chloride: 100 mmol/L — ABNORMAL LOW (ref 101–111)
GFR calc Af Amer: 37 mL/min — ABNORMAL LOW (ref >60)
GFR calc non Af Amer: 32 mL/min — ABNORMAL LOW (ref >60)
GLUCOSE: 131 mg/dL — AB (ref 65–99)
Potassium: 3.9 mmol/L (ref 3.5–5.1)
Sodium: 132 mmol/L — ABNORMAL LOW (ref 135–145)
TOTAL PROTEIN: 7.9 g/dL (ref 6.5–8.1)

## 2016-08-31 MED ORDER — SODIUM CHLORIDE 0.9 % IV BOLUS (SEPSIS)
1000.0000 mL | Freq: Once | INTRAVENOUS | Status: AC
  Administered 2016-08-31: 1000 mL via INTRAVENOUS

## 2016-08-31 NOTE — ED Provider Notes (Signed)
[name] Provider Note   CSN: [id number] Arrival date & time: 08/31/16  2117  By signing my name below, I, [name], attest that this documentation has been prepared under the direction and in the presence of Kerri Speak, MD. Electronically Signed: Hilbert [name], Scribe. 08/31/16. 10:34 PM. History   Chief Complaint Chief Complaint  Patient presents with  . Weakness     The history is provided by the patient. No language interpreter was used.  Weakness  Primary symptoms include dizziness. This is a new problem. The current episode started more than 1 week ago. The problem has been gradually worsening. There has been no fever. Associated symptoms include vomiting. Pertinent negatives include no shortness of breath and no chest pain.    HPI Comments: [name] is a 83 y.o. female who presents to the Emergency Department complaining of weakness for the past couple of weeks. The patient states that she fell around 7 pm tonight. The patient states that she felt kind of dizzy at that time. She reports some pain in her right hip. Per Family: The patient was given a new medication last week for a rash. They believe the patient hasn't been herself for the past 2 weeks. She fell two times today. She typically is able to ambulate with no help but is not able to today. She reports that the patient had some diarrhea earlier this week, but it has currently resolved. She denies fever, cough, CP, SOB, and abdominal pain. She has been eating and drinking fine. She has hx of diabetes and HTN.  Past Medical History:  Diagnosis Date  . Anxiety   . Arthritis   . Cancer [facility]) 2006   colon  . Cataract   . Diabetes mellitus without complication ([place])   . Diverticulitis   . Erythroderma desquamativum   . Hypertension     Patient Active Problem List   Diagnosis Date Noted  . Dyspnea 05/15/2016  . Congestive heart failure (CHF) ([place]) 05/15/2016  . Rash and nonspecific skin  eruption 05/15/2016  . Essential hypertension 05/15/2016  . Diabetes mellitus type 2 in obese ([place]) 05/15/2016  . Anxiety 05/15/2016  . Acute respiratory failure with hypoxia ([place]) 05/15/2016    Past Surgical History:  Procedure Laterality Date  . ABDOMINAL HYSTERECTOMY    . APPENDECTOMY    . COLON SURGERY      OB History    No data available       Home Medications    Prior to Admission medications   Medication Sig Start Date End Date Taking? Authorizing Provider  albuterol (PROVENTIL) (2.5 MG/3ML) 0.083% nebulizer solution Take 2.5 mg by nebulization every 4 (four) hours as needed for wheezing or shortness of breath (use every 4 to 6 hrs as needed).   Yes Historical Provider, MD  ALPRAZolam Duanne Moron) 0.5 MG tablet Take 0.5 mg by mouth 3 (three) times daily as needed for anxiety.    Yes Historical Provider, MD  amLODipine (NORVASC) 5 MG tablet Take 5 mg by mouth daily.   Yes Historical Provider, MD  clobetasol cream (TEMOVATE) [id number] % Apply 1 application topically 2 (two) times daily as needed. Do not put on face, can only use for 2 weeks continuous at a time 03/17/15  Yes Thao P Le, DO  folic acid (FOLVITE) 1 MG tablet Take 1 mg by mouth daily.   Yes Historical Provider, MD  furosemide (LASIX) 40 MG tablet Take 1 tablet (40 mg total) by mouth daily. 05/19/16  Yes Erline Hau, MD  hydrOXYzine (VISTARIL) 25 MG capsule Take 1 capsule (25 mg total) by mouth 3 (three) times daily as needed. For itchiness, can make you drowsy Patient taking differently: Take 25-50 mg by mouth at bedtime. For itchiness, can make you drowsy 03/17/15  Yes Thao P Le, DO  insulin aspart (NOVOLOG) 100 UNIT/ML injection Inject into the skin 3 (three) times daily as needed for high blood sugar. Per sliding scale instructions from based on blood sugar levels   Yes Historical Provider, MD  insulin glargine (LANTUS) 100 UNIT/ML injection Inject 65 Units into the skin at bedtime.    Yes Historical Provider, MD    labetalol (NORMODYNE) 100 MG tablet Take 100 mg by mouth 2 (two) times daily.    Yes Historical Provider, MD  methotrexate (RHEUMATREX) 2.5 MG tablet Take 7.5 mg by mouth every Monday. Caution:Chemotherapy. Protect from light.    Yes Historical Provider, MD  pravastatin (PRAVACHOL) 40 MG tablet Take 40 mg by mouth daily.   Yes Historical Provider, MD  spironolactone (ALDACTONE) 25 MG tablet Take 25 mg by mouth daily.   Yes Historical Provider, MD  triamcinolone cream (KENALOG) 0.1 % APPLY TWICE DAILY 01/13/12  Yes Ryan M Dunn, PA-C  dicyclomine (BENTYL) 20 MG tablet Take 1 tablet (20 mg total) by mouth 4 (four) times daily -  before meals and at bedtime. Patient not taking: Reported on 08/31/2016 06/18/16   Rolland Porter, MD    Family History Family History  Problem Relation Age of Onset  . Stroke Mother   . Diabetes Father   . Cancer Sister   . Diabetes Sister   . Cancer Brother   . Cancer Brother   . Diabetes Sister   . Stroke Sister     Social History Social History  Substance Use Topics  . Smoking status: Never Smoker  . Smokeless tobacco: Never Used  . Alcohol use No     Allergies   Ace inhibitors; Neosporin [neomycin-bacitracin zn-polymyx]; and Diovan [valsartan]   Review of Systems Review of Systems  Respiratory: Negative for shortness of breath.   Cardiovascular: Negative for chest pain.  Gastrointestinal: Positive for vomiting.  Neurological: Positive for dizziness and weakness.  All other systems reviewed and are negative.    Physical Exam Updated Vital Signs BP (!) 124/48 (BP Location: Right Arm)   Pulse 68   Temp 99.1 F (37.3 C) (Oral)   Resp 20   Ht 5\' 4"  (1.626 m)   Wt 162 lb (73.5 kg)   SpO2 98%   BMI 27.81 kg/m   Physical Exam  Constitutional: She is oriented to person, place, and time. She appears well-developed and well-nourished. No distress.  HENT:  Head: Normocephalic and atraumatic.  Mouth/Throat: Oropharynx is clear and moist.  Eyes:  EOM are normal. Pupils are equal, round, and reactive to light.  Neck: Normal range of motion.  Cardiovascular: Normal rate, regular rhythm and normal heart sounds.   No murmur heard. Pulmonary/Chest: Effort normal and breath sounds normal. No respiratory distress. She has no wheezes. She has no rales.  Abdominal: Soft. She exhibits no distension. There is no tenderness.  Musculoskeletal: Normal range of motion.  Neurological: She is alert and oriented to person, place, and time.  Skin: Skin is warm and dry.  There are excoriated lesions to both forearms.   Psychiatric: She has a normal mood and affect. Judgment normal.  Nursing note and vitals reviewed.    ED Treatments / Results  DIAGNOSTIC STUDIES: Oxygen Saturation is 98% on RA, normal by my interpretation.    COORDINATION OF CARE: 10:25 PM Discussed treatment plan with pt at bedside and pt agreed to plan. I will give the patient some IV fluids. I will check the patient's labs, CXR, and EKG.   Labs (all labs ordered are listed, but only abnormal results are displayed) Labs Reviewed  COMPREHENSIVE METABOLIC PANEL  CBC WITH DIFFERENTIAL/PLATELET  TROPONIN I  URINALYSIS, ROUTINE W REFLEX MICROSCOPIC    EKG ED ECG REPORT   Date: 08/31/2016  Rate: 68  Rhythm: normal sinus rhythm  QRS Axis: normal  Intervals: normal  ST/T Wave abnormalities: normal  Conduction Disutrbances:none  Narrative Interpretation:   Old EKG Reviewed: unchanged  I have personally reviewed the EKG tracing and agree with the computerized printout as noted.   Radiology No results found.  Procedures Procedures (including critical care time)  Medications Ordered in ED Medications  sodium chloride 0.9 % bolus 1,000 mL (not administered)     Initial Impression / Assessment and Plan / ED Course  I have reviewed the triage vital signs and the nursing notes.  Pertinent labs & imaging results that were available during my care of the patient  were reviewed by me and considered in my medical decision making (see chart for details).  Patient's workup reveals a WBC of 17k, but no obvious source for infection. Urine clear and chest xray clear. Remainder of workup unremarkable, but patient unable to stand and ambulate without maximum assistance.  She will undergo head CT and disposition will be made after this is complete. She will most likely require admission, possibly for MRI of brain if CT negative. Care signed out to Dr. Christy Gentles at shift change.  Final Clinical Impressions(s) / ED Diagnoses   Final diagnoses:  None    New Prescriptions New Prescriptions   No medications on file   I personally performed the services described in this documentation, which was scribed in my presence. The recorded information has been reviewed and is accurate.     Kerri Speak, MD 09/01/16 [phone]

## 2016-08-31 NOTE — ED Notes (Signed)
Pt transported to xray at this time

## 2016-08-31 NOTE — ED Notes (Signed)
Pt returned from xray

## 2016-08-31 NOTE — ED Triage Notes (Addendum)
Vomited around 7 pm, she fell twice today and had used the bathroom on herself.  They put her on new medication for itching and rash and I thought it may be a reaction to the medication.  She has complained of her right shoulder and her butt hurting per family.  No abdominal pain.  No diarrhea.  She has been laying around for the past couple of weeks per family, did not want to go out.  She has been doing things around the house, but she has been sleeping a lot.  Patient was started on Methotrexate 2.5 mg once a week by dermatology.  First dose was on 08/25/16

## 2016-09-01 ENCOUNTER — Ambulatory Visit (HOSPITAL_COMMUNITY): Payer: Medicare Other

## 2016-09-01 ENCOUNTER — Observation Stay (HOSPITAL_COMMUNITY): Payer: Medicare Other

## 2016-09-01 ENCOUNTER — Ambulatory Visit (HOSPITAL_COMMUNITY): Admission: RE | Admit: 2016-09-01 | Payer: Medicare Other | Source: Ambulatory Visit

## 2016-09-01 ENCOUNTER — Encounter (HOSPITAL_COMMUNITY): Payer: Self-pay | Admitting: Internal Medicine

## 2016-09-01 ENCOUNTER — Emergency Department (HOSPITAL_COMMUNITY): Payer: Medicare Other

## 2016-09-01 DIAGNOSIS — L97509 Non-pressure chronic ulcer of other part of unspecified foot with unspecified severity: Secondary | ICD-10-CM

## 2016-09-01 DIAGNOSIS — R42 Dizziness and giddiness: Secondary | ICD-10-CM

## 2016-09-01 DIAGNOSIS — S91102A Unspecified open wound of left great toe without damage to nail, initial encounter: Secondary | ICD-10-CM | POA: Diagnosis not present

## 2016-09-01 DIAGNOSIS — R531 Weakness: Secondary | ICD-10-CM | POA: Diagnosis not present

## 2016-09-01 DIAGNOSIS — L400 Psoriasis vulgaris: Secondary | ICD-10-CM | POA: Diagnosis present

## 2016-09-01 DIAGNOSIS — M25551 Pain in right hip: Secondary | ICD-10-CM | POA: Diagnosis not present

## 2016-09-01 DIAGNOSIS — M7989 Other specified soft tissue disorders: Secondary | ICD-10-CM | POA: Diagnosis not present

## 2016-09-01 DIAGNOSIS — M79672 Pain in left foot: Secondary | ICD-10-CM | POA: Diagnosis not present

## 2016-09-01 DIAGNOSIS — E11621 Type 2 diabetes mellitus with foot ulcer: Secondary | ICD-10-CM | POA: Diagnosis present

## 2016-09-01 LAB — INFLUENZA PANEL BY PCR (TYPE A & B)
Influenza A By PCR: NEGATIVE
Influenza B By PCR: NEGATIVE

## 2016-09-01 LAB — CBC
HEMATOCRIT: 33.9 % — AB (ref 36.0–46.0)
HEMOGLOBIN: 11.4 g/dL — AB (ref 12.0–15.0)
MCH: 28.6 pg (ref 26.0–34.0)
MCHC: 33.6 g/dL (ref 30.0–36.0)
MCV: 85.2 fL (ref 78.0–100.0)
Platelets: 388 10*3/uL (ref 150–400)
RBC: 3.98 MIL/uL (ref 3.87–5.11)
RDW: 14.7 % (ref 11.5–15.5)
WBC: 17.1 10*3/uL — ABNORMAL HIGH (ref 4.0–10.5)

## 2016-09-01 LAB — BASIC METABOLIC PANEL
ANION GAP: 10 (ref 5–15)
BUN: 37 mg/dL — ABNORMAL HIGH (ref 6–20)
CHLORIDE: 103 mmol/L (ref 101–111)
CO2: 21 mmol/L — ABNORMAL LOW (ref 22–32)
Calcium: 8.5 mg/dL — ABNORMAL LOW (ref 8.9–10.3)
Creatinine, Ser: 1.26 mg/dL — ABNORMAL HIGH (ref 0.44–1.00)
GFR calc Af Amer: 44 mL/min — ABNORMAL LOW (ref >60)
GFR calc non Af Amer: 38 mL/min — ABNORMAL LOW (ref >60)
GLUCOSE: 73 mg/dL (ref 65–99)
POTASSIUM: 3.8 mmol/L (ref 3.5–5.1)
Sodium: 134 mmol/L — ABNORMAL LOW (ref 135–145)

## 2016-09-01 LAB — GLUCOSE, CAPILLARY
GLUCOSE-CAPILLARY: 163 mg/dL — AB (ref 65–99)
GLUCOSE-CAPILLARY: 160 mg/dL — AB (ref 65–99)
Glucose-Capillary: 80 mg/dL (ref 65–99)
Glucose-Capillary: 255 mg/dL — ABNORMAL HIGH (ref 65–99)

## 2016-09-01 LAB — URINALYSIS, ROUTINE W REFLEX MICROSCOPIC
BILIRUBIN URINE: NEGATIVE
Glucose, UA: NEGATIVE mg/dL
Hgb urine dipstick: NEGATIVE
KETONES UR: NEGATIVE mg/dL
Leukocytes, UA: NEGATIVE
NITRITE: NEGATIVE
Protein, ur: NEGATIVE mg/dL
Specific Gravity, Urine: 1.013 (ref 1.005–1.030)
pH: 5 (ref 5.0–8.0)

## 2016-09-01 LAB — MRSA PCR SCREENING: MRSA by PCR: NEGATIVE

## 2016-09-01 LAB — PREALBUMIN: Prealbumin: 20.6 mg/dL (ref 18–38)

## 2016-09-01 LAB — C-REACTIVE PROTEIN: CRP: 8.7 mg/dL — ABNORMAL HIGH (ref <1.0)

## 2016-09-01 LAB — SEDIMENTATION RATE: Sed Rate: 70 mm/hr — ABNORMAL HIGH (ref 0–22)

## 2016-09-01 MED ORDER — LABETALOL HCL 200 MG PO TABS
100.0000 mg | ORAL_TABLET | Freq: Two times a day (BID) | ORAL | Status: DC
  Administered 2016-09-01 – 2016-09-02 (×3): 100 mg via ORAL
  Filled 2016-09-01 (×3): qty 1

## 2016-09-01 MED ORDER — ALPRAZOLAM 0.5 MG PO TABS
0.5000 mg | ORAL_TABLET | Freq: Once | ORAL | Status: AC
  Administered 2016-09-01: 0.5 mg via ORAL

## 2016-09-01 MED ORDER — FUROSEMIDE 40 MG PO TABS
40.0000 mg | ORAL_TABLET | Freq: Every day | ORAL | Status: DC
  Administered 2016-09-01 – 2016-09-02 (×2): 40 mg via ORAL
  Filled 2016-09-01 (×2): qty 1

## 2016-09-01 MED ORDER — LORAZEPAM 2 MG/ML IJ SOLN: 0.5000 mg | Freq: Once | INTRAMUSCULAR | Status: DC

## 2016-09-01 MED ORDER — ENOXAPARIN SODIUM 30 MG/0.3ML ~~LOC~~ SOLN
30.0000 mg | SUBCUTANEOUS | Status: DC
  Administered 2016-09-01: 30 mg via SUBCUTANEOUS
  Filled 2016-09-01: qty 0.3

## 2016-09-01 MED ORDER — FOLIC ACID 1 MG PO TABS
1.0000 mg | ORAL_TABLET | Freq: Every day | ORAL | Status: DC
  Administered 2016-09-01 – 2016-09-02 (×2): 1 mg via ORAL
  Filled 2016-09-01 (×2): qty 1

## 2016-09-01 MED ORDER — AMLODIPINE BESYLATE 5 MG PO TABS
5.0000 mg | ORAL_TABLET | Freq: Every day | ORAL | Status: DC
  Administered 2016-09-01 – 2016-09-02 (×2): 5 mg via ORAL
  Filled 2016-09-01 (×2): qty 1

## 2016-09-01 MED ORDER — SPIRONOLACTONE 25 MG PO TABS
25.0000 mg | ORAL_TABLET | Freq: Every day | ORAL | Status: DC
  Administered 2016-09-01 – 2016-09-02 (×2): 25 mg via ORAL
  Filled 2016-09-01 (×2): qty 1

## 2016-09-01 MED ORDER — ENSURE ENLIVE PO LIQD: 237.0000 mL | Freq: Two times a day (BID) | ORAL | Status: DC

## 2016-09-01 MED ORDER — ENOXAPARIN SODIUM 40 MG/0.4ML ~~LOC~~ SOLN
40.0000 mg | SUBCUTANEOUS | Status: DC
  Administered 2016-09-02: 40 mg via SUBCUTANEOUS
  Filled 2016-09-01: qty 0.4

## 2016-09-01 MED ORDER — MUPIROCIN CALCIUM 2 % EX CREA
TOPICAL_CREAM | Freq: Two times a day (BID) | CUTANEOUS | Status: DC
  Administered 2016-09-01 – 2016-09-02 (×3): via TOPICAL
  Filled 2016-09-01: qty 15

## 2016-09-01 MED ORDER — AMOXICILLIN-POT CLAVULANATE 500-125 MG PO TABS
1.0000 | ORAL_TABLET | Freq: Two times a day (BID) | ORAL | Status: DC
  Administered 2016-09-01 – 2016-09-02 (×3): 500 mg via ORAL
  Filled 2016-09-01 (×3): qty 1

## 2016-09-01 MED ORDER — ACETAMINOPHEN 650 MG RE SUPP
650.0000 mg | Freq: Four times a day (QID) | RECTAL | Status: DC | PRN
  Administered 2016-09-01: 650 mg via RECTAL
  Filled 2016-09-01: qty 1

## 2016-09-01 MED ORDER — ONDANSETRON HCL 4 MG PO TABS: 4.0000 mg | ORAL_TABLET | Freq: Four times a day (QID) | ORAL | Status: DC | PRN

## 2016-09-01 MED ORDER — PRAVASTATIN SODIUM 40 MG PO TABS
40.0000 mg | ORAL_TABLET | Freq: Every day | ORAL | Status: DC
  Administered 2016-09-01 – 2016-09-02 (×2): 40 mg via ORAL
  Filled 2016-09-01 (×2): qty 1

## 2016-09-01 MED ORDER — INSULIN GLARGINE 100 UNIT/ML ~~LOC~~ SOLN
65.0000 [IU] | Freq: Every day | SUBCUTANEOUS | Status: DC
  Administered 2016-09-01: 65 [IU] via SUBCUTANEOUS
  Filled 2016-09-01 (×2): qty 0.65

## 2016-09-01 MED ORDER — AMOXICILLIN-POT CLAVULANATE 875-125 MG PO TABS: 1.0000 | ORAL_TABLET | Freq: Two times a day (BID) | ORAL | Status: DC

## 2016-09-01 MED ORDER — LACTATED RINGERS IV SOLN
INTRAVENOUS | Status: DC
  Administered 2016-09-01 (×2): via INTRAVENOUS

## 2016-09-01 MED ORDER — GLUCERNA SHAKE PO LIQD
237.0000 mL | Freq: Two times a day (BID) | ORAL | Status: DC
  Administered 2016-09-01 – 2016-09-02 (×3): 237 mL via ORAL

## 2016-09-01 MED ORDER — MORPHINE SULFATE (PF) 2 MG/ML IV SOLN: 2.0000 mg | INTRAVENOUS | Status: DC | PRN

## 2016-09-01 MED ORDER — ALPRAZOLAM 0.5 MG PO TABS
0.2500 mg | ORAL_TABLET | Freq: Once | ORAL | Status: DC
  Filled 2016-09-01: qty 1

## 2016-09-01 MED ORDER — ACETAMINOPHEN 325 MG PO TABS: 650.0000 mg | ORAL_TABLET | Freq: Four times a day (QID) | ORAL | Status: DC | PRN

## 2016-09-01 MED ORDER — HYDROXYZINE PAMOATE 25 MG PO CAPS
25.0000 mg | ORAL_CAPSULE | Freq: Every day | ORAL | Status: DC
  Filled 2016-09-01 (×2): qty 2

## 2016-09-01 MED ORDER — ALBUTEROL SULFATE (2.5 MG/3ML) 0.083% IN NEBU: 2.5000 mg | INHALATION_SOLUTION | RESPIRATORY_TRACT | Status: DC | PRN

## 2016-09-01 MED ORDER — HYDROXYZINE HCL 25 MG PO TABS
25.0000 mg | ORAL_TABLET | Freq: Every day | ORAL | Status: DC
  Administered 2016-09-01: 25 mg via ORAL
  Filled 2016-09-01: qty 1

## 2016-09-01 MED ORDER — ONDANSETRON HCL 4 MG/2ML IJ SOLN: 4.0000 mg | Freq: Four times a day (QID) | INTRAMUSCULAR | Status: DC | PRN

## 2016-09-01 MED ORDER — ALPRAZOLAM 0.5 MG PO TABS: 0.5000 mg | ORAL_TABLET | Freq: Three times a day (TID) | ORAL | Status: DC | PRN

## 2016-09-01 MED ORDER — INSULIN ASPART 100 UNIT/ML ~~LOC~~ SOLN
0.0000 [IU] | Freq: Three times a day (TID) | SUBCUTANEOUS | Status: DC
  Administered 2016-09-01 – 2016-09-02 (×3): 3 [IU] via SUBCUTANEOUS

## 2016-09-01 NOTE — ED Notes (Signed)
Pt returned from CT °

## 2016-09-01 NOTE — Progress Notes (Signed)
Initial Nutrition Assessment  DOCUMENTATION CODES:   Not applicable  INTERVENTION:  - Continue Glucerna po BID, each supplement provides 220 calories and 10 grams protein  - Provide diabetic snacks BID  NUTRITION DIAGNOSIS:   Increased nutrient needs related to wound healing as evidenced by estimated needs.  GOAL:   Patient will meet greater than or equal to 90% of their needs  MONITOR:   PO intake, Skin, I & O's, Weight trends, Supplement acceptance  REASON FOR ASSESSMENT:   Consult Wound healing  ASSESSMENT:   83 y.o. female with PMH of HTN, DM, CHF, remote colon CA, and anxiety presenting with progressive weakness and dizziness. She has had a long-standing rash and was diagnosed with erythroderma and biopsy indicates it is from psoriasis vulgaris. She was given topicals for treatment as well as methotrexate. Her granddaughter, states pt has experienced frequent falls and vomited. Did not have problems with falls prior to Methotrexate. Generalized weakness, difficulty standing, dizziness, and fatigue. Remains cognitively intact but very hard of hearing. Complaining of pain in the right hip area posteriorly following fall.  Has a diabetic foot ulcer on her toe.   Family, daughter and granddaughter, at bedside assist with collecting history. Pt and family report recent weight loss. Per family report, pt's usual body weight is around 162 lbs. Per chart review pt has experienced recent weight loss of 9 lb in 2 months, 162 lbs to current weight of 153 lbs (5.5% weight loss- not significant for time frame)  Per chart review, pt has no meal completion percentages recorded. Pt reports she makes herself eat so she can feel better. Meal tray was delivered at time of visit.   Pt reports willingness to consume nutritional supplement drinks. Pt also reports willingness to snack throughout the day.  Per nutrition focused physical exam pt showed no fat depletion, mild/moderate muscle  depletion in the temple area only, and no edema.  Pt may be at risk for malnutrition but does not meet criteria at this time.   Labs reviewed; Na (134) Medications reviewed; 1 mg Folic acid  Diet Order:  Diet Carb Modified Fluid consistency: Thin; Room service appropriate? Yes  Skin:  Wound (see comment) (Stage II pressure injury to medial sacrum, Stage III pressure injury to left great toe)  Last BM:  No BM date recorded  Height:   Ht Readings from Last 1 Encounters:  08/31/16 5\' 4"  (1.626 m)    Weight:   Wt Readings from Last 1 Encounters:  09/01/16 153 lb (69.4 kg)    Ideal Body Weight:  54.5 kg  BMI:  Body mass index is 26.26 kg/m.  Estimated Nutritional Needs:   Kcal:  1600-1800 (23-26 kcal/kg)  Protein:  90-100 grams (1.3-1.5)  Fluid:  >/= 1.6 L/d  EDUCATION NEEDS:   Education needs addressed  [employer] Ranger Dietetic Intern

## 2016-09-01 NOTE — ED Notes (Signed)
Pt vomited bile- found on pt gown and linens, pt gown and linens changed.

## 2016-09-01 NOTE — ED Notes (Signed)
Pt very unsteady when standing for orthostatics- pt reports dizziness with standing, pt unable to stand without holding on to me. This nurse did not ambulate patient per best  nursing judgement- Dr Stark Jock made aware.

## 2016-09-01 NOTE — Progress Notes (Signed)
Notified MD of increased temperature.  Received order for flu swab, and blood cultures.  Tylenol given.  Will continue to monitor patient.

## 2016-09-01 NOTE — ED Notes (Addendum)
Dr. Wickline at bedside.  

## 2016-09-01 NOTE — Evaluation (Deleted)
Physical Therapy Evaluation Patient Details Name: [name] MRN: [id number] DOB: Jul 05, 1931 Today's Date: 09/01/2016   History of Present Illness   [name] is a 83 y.o. female with medical history significant of HTN, DM, CHF (admission in 11/17, preserved EF with grade 2 diastolic dysfunction), remote colon CA and anxiety presenting with progressive weakness and dizziness.  She has had a long-standing rash and saw dermatology at [facility] on 2/[facility]; she was diagnosed with erythroderma and biopsy indicates that it is from psoriasis vulgaris.  She was given topicals for treatment as well as methotrexate.  Her granddaughter, who provided the history because the patient is extremely hard of hearing, reports that the patient hasn't been leaving home or going anywhere.  She has been okay, just not leaving the house.  [place] backwards a few days ago.  The last few days she hasn't been herself.  Earlier today, she fell twice.  Granddaughter checked on her, she was in the floor.  Got her up, vomited, she fell again later.  Granddaughter had to help her to the bathroom, which is very unusual for the patient.  Did not have problems with falls prior to Methotrexate.  +dizzy.  Generalized weakness, difficulty standing.  Has been less mobile since shde received the medication.  More tired, fatigued, sleeping.  Remains cognitively intact, just very hard of hearing.    Clinical Impression  Pt oriented with therapist.  States that she normally uses no assistive device at home but has a rolling walker due To a previous Lt hip injury.  Pt lives with daughter who works therefore she is alone during the daytime hours.  Her Husband is currently in a SNF.  [name] will benefit from skilled therapy to progress back to previous state of ambulating without an assistive device.  She will do well with HH.   Follow Up Recommendations Home health PT    Equipment Recommendations  None recommended by PT (PT has a rollling  walker at home )    Recommendations for Other Services       Precautions / Restrictions Precautions Precautions: None Restrictions Weight Bearing Restrictions: No      Mobility  Bed Mobility Overal bed mobility: Independent                Transfers Overall transfer level: Independent Equipment used: Rolling walker (2 wheeled)                Ambulation/Gait Ambulation/Gait assistance: Modified independent (Device/Increase time) Ambulation Distance (Feet): 85 Feet Assistive device: Rolling walker (2 wheeled) Gait Pattern/deviations: WFL(Within Functional Limits)   Gait velocity interpretation: at or above normal speed for age/gender    Stairs            Wheelchair Mobility    Modified Rankin (Stroke Patients Only)       Balance Overall balance assessment:  (sitting I; dynamic standing mod I)                                           Pertinent Vitals/Pain Pain Assessment: 0-10 Pain Score: 3  Pain Location: Lt hip, (chronic pain) Pain Descriptors / Indicators: Aching Pain Intervention(s): Monitored during session;Limited activity within patient's tolerance    Home Living Family/patient expects to be discharged to:: Private residence Living Arrangements: Children (lives with daughter who works ) Available Help at Discharge: Family Type of Home:  House Home Access: Stairs to enter Entrance Stairs-Rails: Right Entrance Stairs-Number of Steps: 2 Home Layout: One level Home Equipment: [place] - 2 wheels;Cane - single point      Prior Function Level of Independence: Independent                  Extremity/Trunk Assessment        Lower Extremity Assessment Lower Extremity Assessment: Overall WFL for tasks assessed       Communication   Communication: No difficulties  Cognition Arousal/Alertness: Awake/alert Behavior During Therapy: WFL for tasks assessed/performed Overall Cognitive Status: Within Functional  Limits for tasks assessed                         Exercises General Exercises - Lower Extremity Heel Slides:  (bridging x 10 ) Hip ABduction/ADduction: 10 reps Straight Leg Raises: Both;10 reps   Assessment/Plan    PT Assessment Patient needs continued PT services.  Attempt ambulation with a cane next treatment.   PT Problem List Decreased activity tolerance       PT Treatment Interventions Gait training;Therapeutic exercise    PT Goals (Current goals can be found in the Care Plan section)  Acute Rehab PT Goals Patient Stated Goal: To go home  PT Goal Formulation: With patient Time For Goal Achievement: 09/03/16 Potential to Achieve Goals: Good    Frequency Min 3X/week   Barriers to discharge           End of Session Equipment Utilized During Treatment: Gait belt Activity Tolerance: Patient tolerated treatment well Patient left: in bed Nurse Communication: Mobility status PT Visit Diagnosis: History of falling (Z91.81);Pain    Functional Assessment Tool Used: AM-PAC 6 Clicks Basic Mobility Functional Limitation: Mobility: Walking and moving around Mobility: Walking and Moving Around Current Status [phone]): At least 20 percent but less than 40 percent impaired, limited or restricted Mobility: Walking and Moving Around Goal Status [phone]): At least 20 percent but less than 40 percent impaired, limited or restricted Mobility: Walking and Moving Around Discharge Status [phone]): At least 20 percent but less than 40 percent impaired, limited or restricted    Time: 0940-1022 PT Time Calculation (min) (ACUTE ONLY): 42 min   Charges:         PT G Codes:   PT G-Codes **NOT FOR INPATIENT CLASS** Functional Assessment Tool Used: AM-PAC 6 Clicks Basic Mobility Functional Limitation: Mobility: Walking and moving around Mobility: Walking and Moving Around Current Status [id number]): At least 20 percent but less than 40 percent impaired, limited or restricted Mobility:  Walking and Moving Around Goal Status [phone]): At least 20 percent but less than 40 percent impaired, limited or restricted Mobility: Walking and Moving Around Discharge Status [phone]): At least 20 percent but less than 40 percent impaired, limited or restricted    Rayetta Humphrey, PT CLT [phone] 09/01/2016, 10:28 AM

## 2016-09-01 NOTE — Consult Note (Addendum)
[facility] Nurse wound consult note Reason for Consult: toe wound.  Patient is followed by Dr. Nevada Crane  I have contacted the bedside nurse to discuss the consult further.  Pending ABI and MRI, would change POC based on those results  Wound type: neuropathic foot ulceration  Pressure Injury POA: Yes; noted Stage 2 on bedside documentation, however I have discussed this with the bedside nurse on duty today.  Feel that we may be dealing with MASD based on the size documented and the fact the patient was incontinent at home.  Bedside nurse will follow up, regardless maintenance of sacral foam would protect the area from further injury. Measurement: Toe: 0.5cm x 0.5cm x 0.1cm Sacrum: see bedside nurse flow sheet Wound bed: Toe: pink, moist Sacrum: see bedside nurse flow sheet Drainage (amount, consistency, odor) no drainage, or odor from the toe wound Periwound: intact  Dressing procedure/placement/frequency:  Will add Bactroban for small ulcer on the toe, however once I review ABI results and MRI may need to consider further evaluations. I have asked the bedside nurse to report to me status of documented Stage 2 if it is different that described in the nursing flow sheets.  Will follow along to review diagnostics today with primary team. [facility] [place]  09/02/16 Contacted by bedside nurse late yesterday afternoon to verify that area documented on the nursing flow sheet as a Stage 2 pressure injury on the sacrum was actually related to a recent rash patient presented with last week to PCP, has since been treated.  She is incontinent as well so additional skin irritation has resulted over the buttocks.  Bedside nurse to update the nursing documentation in the flow sheets.   Followed up this am on ABI results which are normal and MRI is negative for osteo.  Routine follow up with Dr. Nevada Crane.  No other new wound care orders at this time. Marica Otter MSN,RN,CWOCN, CNS

## 2016-09-01 NOTE — Care Management Obs Status (Signed)
[facility] NOTIFICATION   Patient Details  Name: [name] MRN: [id number] Date of Birth: 09/16/1930   Medicare Observation Status Notification Given:  Yes    Mansi Tokar, Chauncey Reading, RN 09/01/2016, 12:08 PM

## 2016-09-01 NOTE — Clinical Social Work Note (Signed)
CSW received consult for medication assistance. CM notified. CSW signing off.  Levon Boettcher, LCSW [phone] 

## 2016-09-01 NOTE — Care Management Note (Signed)
Case Management Note  Patient Details  Name: ROSELIE BOLASH MRN: [id number] Date of Birth: [birth year]  Subjective/Objective:   Patient adm from home with weakness, MRI pending for foot wound. She is from home alone, PT recommends HH PT and RW. Family at bedside. They would like [facility] for [facility] PT if possible. [facility] will deliver RW to room prior to discharge.                  Action/Plan: CM spoke with [facility] rep, who states they can accept patient for PT. CM will notify Landry Corporal of DC date.  Romualdo Bolk notified of need for RW.   Expected Discharge Date:     09/02/2016             Expected Discharge Plan:  [place]  In-House Referral:     Discharge planning Services  CM Consult  Post Acute Care Choice:  Home Health, Durable Medical Equipment Choice offered to:  Patient, Adult Children  DME Arranged:  Walker rolling DME Agency:  [facility] Arranged:  PT [place]:  [place]  Status of Service:  In process, will continue to follow  If discussed at Long Length of Stay Meetings, dates discussed:    Additional Comments:  Royer Cristobal, Chauncey Reading, RN 09/01/2016, 12:44 PM

## 2016-09-01 NOTE — ED Notes (Signed)
Pt being transported to CT then to admission bed.

## 2016-09-01 NOTE — H&P (Signed)
History and Physical    [name] [id number] DOB: 05-Jul-1931 DOA: 08/31/2016  PCP: Wende Neighbors, MD Consultants:  Dermatologist - [facility] Patient coming from: home - lives alone; NOK: granddaughter, [place], [phone]  Chief Complaint: weakness, dizziness  HPI: [name] is a 83 y.o. female with medical history significant of HTN, DM, CHF (admission in 11/17, preserved EF with grade 2 diastolic dysfunction), remote colon CA and anxiety presenting with progressive weakness and dizziness.  She has had a long-standing rash and saw dermatology at [facility] on 2/5; she was diagnosed with erythroderma and biopsy indicates that it is from psoriasis vulgaris.  She was given topicals for treatment as well as methotrexate.  Her granddaughter, who provided the history because the patient is extremely hard of hearing, reports that the patient hasn't been leaving home or going anywhere.  She has been okay, just not leaving the house.  [place] backwards a few days ago.  The last few days she hasn't been herself.  Earlier today, she fell twice.  Granddaughter checked on her, she was in the floor.  Got her up, vomited, she fell again later.  Granddaughter had to help her to the bathroom, which is very unusual for the patient.  Did not have problems with falls prior to Methotrexate.  +dizzy.  Generalized weakness, difficulty standing.  Has been less mobile since shde received the medication.  More tired, fatigued, sleeping.  Remains cognitively intact, just very hard of hearing.    Rash has been ongoing for about a year.  Erythroderma, broad ddx.  Rash has ben improving with the methotrezxate.    Complaining of pain in the right hip area posteriorly following fall.  Has a diabetic foot ulcer on her toe, saw Dr. Juel Burrow office for this and was treated with antibiotics.  No fevers.  Feels very cold at times, dermatology suggested that was related to her rash.   ED Course: 1L bolus, head CT  Review of Systems:  As per HPI; otherwise 10 point review of systems reviewed and negative.   Ambulatory Status:  Ambulatory prior to recently  Past Medical History:  Diagnosis Date  . Anxiety   . Arthritis   . Cancer [facility]) 2006   colon  . Cataract   . CHF (congestive heart failure) ([place]) 05/2016   preserved EF, grade 2 diastolic dysfunction  . Diabetes mellitus without complication ([place])   . Diverticulitis   . Erythroderma desquamativum   . Hypertension     Past Surgical History:  Procedure Laterality Date  . ABDOMINAL HYSTERECTOMY    . APPENDECTOMY    . COLON SURGERY      Social History   Social History  . Marital status: Widowed    Spouse name: N/A  . Number of children: N/A  . Years of education: N/A   Occupational History  . Not on file.   Social History Main Topics  . Smoking status: Never Smoker  . Smokeless tobacco: Never Used  . Alcohol use No  . Drug use: No  . Sexual activity: Not on file   Other Topics Concern  . Not on file   Social History Narrative  . No narrative on file    Allergies  Allergen Reactions  . Ace Inhibitors Dermatitis  . Neosporin [Neomycin-Bacitracin Zn-Polymyx] Dermatitis  . Diovan [Valsartan] Rash    Family History  Problem Relation Age of Onset  . Stroke Mother   . Diabetes Father   . Cancer Sister   . Diabetes  Sister   . Cancer Brother   . Cancer Brother   . Diabetes Sister   . Stroke Sister     Prior to Admission medications   Medication Sig Start Date End Date Taking? Authorizing Provider  albuterol (PROVENTIL) (2.5 MG/3ML) 0.083% nebulizer solution Take 2.5 mg by nebulization every 4 (four) hours as needed for wheezing or shortness of breath (use every 4 to 6 hrs as needed).   Yes Historical Provider, MD  ALPRAZolam Duanne Moron) 0.5 MG tablet Take 0.5 mg by mouth 3 (three) times daily as needed for anxiety.    Yes Historical Provider, MD  amLODipine (NORVASC) 5 MG tablet Take 5 mg by mouth daily.   Yes Historical Provider, MD    clobetasol cream (TEMOVATE) [id number] % Apply 1 application topically 2 (two) times daily as needed. Do not put on face, can only use for 2 weeks continuous at a time 03/17/15  Yes Thao P Le, DO  folic acid (FOLVITE) 1 MG tablet Take 1 mg by mouth daily.   Yes Historical Provider, MD  furosemide (LASIX) 40 MG tablet Take 1 tablet (40 mg total) by mouth daily. 05/19/16  Yes Estela Leonie Green, MD  hydrOXYzine (VISTARIL) 25 MG capsule Take 1 capsule (25 mg total) by mouth 3 (three) times daily as needed. For itchiness, can make you drowsy Patient taking differently: Take 25-50 mg by mouth at bedtime. For itchiness, can make you drowsy 03/17/15  Yes Thao P Le, DO  insulin aspart (NOVOLOG) 100 UNIT/ML injection Inject into the skin 3 (three) times daily as needed for high blood sugar. Per sliding scale instructions from based on blood sugar levels   Yes Historical Provider, MD  insulin glargine (LANTUS) 100 UNIT/ML injection Inject 65 Units into the skin at bedtime.    Yes Historical Provider, MD  labetalol (NORMODYNE) 100 MG tablet Take 100 mg by mouth 2 (two) times daily.    Yes Historical Provider, MD  methotrexate (RHEUMATREX) 2.5 MG tablet Take 7.5 mg by mouth every Monday. Caution:Chemotherapy. Protect from light.    Yes Historical Provider, MD  pravastatin (PRAVACHOL) 40 MG tablet Take 40 mg by mouth daily.   Yes Historical Provider, MD  silver sulfADIAZINE (SILVADENE) 1 % cream Apply 1 application topically daily as needed. Mixed with TAC 0.1% cream   Yes Historical Provider, MD  spironolactone (ALDACTONE) 25 MG tablet Take 25 mg by mouth daily.   Yes Historical Provider, MD  triamcinolone cream (KENALOG) 0.1 % APPLY TWICE DAILY 01/13/12  Yes Ryan M Dunn, PA-C  dicyclomine (BENTYL) 20 MG tablet Take 1 tablet (20 mg total) by mouth 4 (four) times daily -  before meals and at bedtime. Patient not taking: Reported on 08/31/2016 06/18/16   Rolland Porter, MD    Physical Exam: Vitals:   09/01/16 0020  09/01/16 0130 09/01/16 0200 09/01/16 0215  BP: 137/83 147/89 153/70   Pulse: 86 72 85   Resp: 20 18 16    Temp:  97.9 F (36.6 C)  97.9 F (36.6 C)  TempSrc:  Oral    SpO2: 93% 99% 98%   Weight:      Height:         General: Intermittent shivers/rigors without fever, improved after she was given her home Xanax Eyes:  PERRL, EOMI, normal lids, iris ENT:  Very hard of hearing, normal lips & tongue, mmm Neck:  no LAD, masses or thyromegaly Cardiovascular:  RRR, no m/r/g. No LE edema.  Respiratory:  CTA bilaterally, no  w/r/r. Normal respiratory effort. Abdomen:  soft, ntnd, NABS Skin:  Diffuse excoriated rash with mild-moderate erythema; s/p skin graft to right lower leg; diabetic foot ulcer on the tip of the right foot, mildly erythematous, tender to palpation Musculoskeletal:  LLE is much larger than the right which the family states is chronic and unchanged, due to h/o right lower leg skin grafting; marked tenderness to palpation and with movement of right posterior hip Psychiatric:  grossly normal mood and affect, speech fluent and appropriate, AOx3 Neurologic:  CN 2-12 grossly intact, moves all extremities in coordinated fashion, sensation intact  Labs on Admission: I have personally reviewed following labs and imaging studies  CBC:  Recent Labs Lab 08/31/16 2239  WBC 17.1*  NEUTROABS 14.5*  HGB 11.8*  HCT 35.4*  MCV 85.3  PLT 123XX123   Basic Metabolic Panel:  Recent Labs Lab 08/31/16 2239  NA 132*  K 3.9  CL 100*  CO2 22  GLUCOSE 131*  BUN 42*  CREATININE 1.45*  CALCIUM 8.9   GFR: Estimated Creatinine Clearance: 28.9 mL/min (by C-G formula based on SCr of 1.45 mg/dL (H)). Liver Function Tests:  Recent Labs Lab 08/31/16 2239  AST 18  ALT 17  ALKPHOS 61  BILITOT 0.6  PROT 7.9  ALBUMIN 3.2*   No results for input(s): LIPASE, AMYLASE in the last 168 hours. No results for input(s): AMMONIA in the last 168 hours. Coagulation Profile: No results for  input(s): INR, PROTIME in the last 168 hours. Cardiac Enzymes:  Recent Labs Lab 08/31/16 2239  TROPONINI <0.03   BNP (last 3 results) No results for input(s): PROBNP in the last 8760 hours. HbA1C: No results for input(s): HGBA1C in the last 72 hours. CBG: No results for input(s): GLUCAP in the last 168 hours. Lipid Profile: No results for input(s): CHOL, HDL, LDLCALC, TRIG, CHOLHDL, LDLDIRECT in the last 72 hours. Thyroid Function Tests: No results for input(s): TSH, T4TOTAL, FREET4, T3FREE, THYROIDAB in the last 72 hours. Anemia Panel: No results for input(s): VITAMINB12, FOLATE, FERRITIN, TIBC, IRON, RETICCTPCT in the last 72 hours. Urine analysis:    Component Value Date/Time   COLORURINE YELLOW 09/01/2016 0001   APPEARANCEUR CLEAR 09/01/2016 0001   LABSPEC 1.013 09/01/2016 0001   PHURINE 5.0 09/01/2016 0001   GLUCOSEU NEGATIVE 09/01/2016 0001   HGBUR NEGATIVE 09/01/2016 0001   BILIRUBINUR NEGATIVE 09/01/2016 0001   KETONESUR NEGATIVE 09/01/2016 0001   PROTEINUR NEGATIVE 09/01/2016 0001   UROBILINOGEN 0.2 08/16/2010 2011   NITRITE NEGATIVE 09/01/2016 0001   LEUKOCYTESUR NEGATIVE 09/01/2016 0001    Creatinine Clearance: Estimated Creatinine Clearance: 28.9 mL/min (by C-G formula based on SCr of 1.45 mg/dL (H)).  Sepsis Labs: @LABRCNTIP (procalcitonin:4,lacticidven:4) )No results found for this or any previous visit (from the past 240 hour(s)).   Radiological Exams on Admission: Dg Chest 2 View  Result Date: 08/31/2016 CLINICAL DATA:  Right hip pain after a fall EXAM: CHEST  2 VIEW COMPARISON:  05/15/2016 FINDINGS: Mild basilar atelectasis on the left. No large effusion. Borderline to mild cardiomegaly with atherosclerosis. No pneumothorax. IMPRESSION: 1. Hazy basilar atelectasis on the left 2. Mild cardiomegaly without overt failure Electronically Signed   By: Donavan Foil M.D.   On: 08/31/2016 23:47   Ct Head Wo Contrast  Result Date: 09/01/2016 CLINICAL DATA:   Weakness, fall in dizzy EXAM: CT HEAD WITHOUT CONTRAST TECHNIQUE: Contiguous axial images were obtained from the base of the skull through the vertex without intravenous contrast. COMPARISON:  None. FINDINGS: Brain: Mild motion  artifact. No acute territorial infarction or intracranial hemorrhage is visualized. No focal mass, mass effect or midline shift. Mild periventricular white matter small vessel change. Mild atrophy. Ventricles nonenlarged. Vascular: No hyperdense vessels.  No unexpected calcifications. Skull: Trace fluid in the inferior mastoids.  No skull fracture. Sinuses/Orbits: Mild mucosal thickening in the ethmoid and maxillary sinuses. No acute orbital abnormality. Other: None IMPRESSION: No definite CT evidence for acute intracranial abnormality. Electronically Signed   By: Donavan Foil M.D.   On: 09/01/2016 01:02   Dg Hip Unilat W Or Wo Pelvis 2-3 Views Right  Result Date: 08/31/2016 CLINICAL DATA:  Right hip pain after fall EXAM: DG HIP (WITH OR WITHOUT PELVIS) 2-3V RIGHT COMPARISON:  None. FINDINGS: Multiple surgical clips in the pelvis. Pubic symphysis appears intact. Degenerative changes of the left hip with osteophytes. The SI joints are symmetric. The right femoral head projects in joint. No fracture. Vascular calcifications. IMPRESSION: No acute osseous abnormality Electronically Signed   By: Donavan Foil M.D.   On: 08/31/2016 23:48    EKG: Independently reviewed.  NSR with rate 68;  no evidence of acute ischemia  Assessment/Plan Principal Problem:   Weakness Active Problems:   Diabetes mellitus type 2 in obese (HCC)   Dizziness   Psoriasis vulgaris   Right hip pain   Diabetic foot ulcer (HCC)   Weakness and dizziness -The patient had a severe rash but was quite functional prior to being started on Methotrexate -Since then, she has had a marked and progressive decline with weakness, dizziness, n/v (today), myalgias, fatigue -While there are alternative explanations,  certainly the timing is suspicious for methotrexate intolerance -Would suggest holding/discontinuing the methotrexate -Her WBC count is elevated, which may be related to infection of uncertain source or may be an acute phase reactant -Negative UA and generally negative CXR (without respiratory symptoms) indicates these are unlikely sources of active infection -Diabetic foot ulcer may be contributing (see below) -She does not appear clinically dehydrated and her Creatinine is 1.45, improved from prior -She does have stable hypoalbuminemia which is likely stemming from mild malnutrition -Also with Hyponatremia, Na 132 (normal in November 2017, trending down since) -Finally, she had an Abnormal PET in 12/17; there was lymphadenopathy that could be reactive but needs follow-up -With her dizziness and instability, she has been falling; will request PT evaluation -Her weakness and dizziness may take some time to resolve, but will give IVF hydration and monitor; she may need ongoing time at home for resolution of symptoms  Psoriasis vulgaris -Patient with erythroderma that biopsy indicates is psoriasis vulgaris -She has shown improvement with the topical treatments and/or methotrexate -However, her current presentation is likely at least somewhat resulting from methotrexate use and it is reasonable to hold and/or discontinue it based on the results of further testing -She will need to f/u with dermatology following this hospitalization  Right hip pain -s/p mechanical fall -Negative xray -She does have marked tenderness with movement as well as point tenderness along the right sacrum -Will order hip CT -Morphine as needed for pain  Diabetic foot ulcer -While this may simply be a nonhealing ulcer, it has been present for some time and has already been treated with antibiotics by her PCP office without improvement -The concern is that there may be an underlying deeper infection such as  osteomyelitis -Will treat with Augmentin for now -MRI to assess for osteomyelitis -ABI ordered -Diabetic foot infection order set utilized, including orders for CM, SW, DM coordinator, wound care,  and nutrition consult.  Diabetes -Continue Lantus -Cover with SSI  DVT prophylaxis: Lovenox  Code Status: Full - confirmed with patient/family Family Communication: Daughter and granddaughter present throughout evaluation Disposition Plan: Home once clinically improved Consults called: CM, SW, DM coordinator, wound care, nutrition, PT Admission status: It is my clinical opinion that referral for OBSERVATION is reasonable and necessary in this patient based on the above information provided. The aforementioned taken together are felt to place the patient at high risk for further clinical deterioration. However it is anticipated that the patient may be medically stable for discharge from the hospital within 24 to 48 hours.    Karmen Bongo MD [facility]  If 7PM-7AM, please contact night-coverage [email or web address] Password [email or web address]  09/01/2016, 2:56 AM

## 2016-09-01 NOTE — Progress Notes (Signed)
Follow up note from earlier admission today:  83 yo HOH, with hx of HTN, DM, CHF with preserved Fx, remote CA, admitted with vertigo, fall, and was having a foot ulcer. MRI of the foot showed no osteo.  CT of the hip was negative for Fx.  She has vertigo.  PT evaluated her and recommended HHT. She was recently placed on MTX and seems to have all these symptoms after starting the MTx. Will D/C MTx, continue with IVF, and plan discharge her tomorrow with HHA.  Orvan Falconer MD FACP. Hospitalist.

## 2016-09-01 NOTE — ED Notes (Signed)
[name], granddaughter of patient gave phone number in case nurse needs to ask questions when admitted upstairs, says they can call anytime if needed. Phone number is [phone].

## 2016-09-01 NOTE — ED Notes (Signed)
Pt becoming restless and anxious. Granddaughter, reports pt usually takes xanax at night and asks could we give her a dose now. Dr Christy Gentles made aware- new orders received.  Dr Lorin Mercy at bedside for hospital admission.

## 2016-09-01 NOTE — Progress Notes (Signed)
Inpatient Diabetes Program Recommendations  AACE/ADA: New Consensus Statement on Inpatient Glycemic Control (2015)  Target Ranges:  Prepandial:   less than 140 mg/dL      Peak postprandial:   less than 180 mg/dL (1-2 hours)      Critically ill patients:  140 - 180 mg/dL   Results for TARLEE, ZARCO (MRN [id number]) as of 09/01/2016 09:58  Ref. Range 09/01/2016 08:27  Glucose-Capillary Latest Ref Range: 65 - 99 mg/dL 80    Admit with: Weakness  History: DM, CHF, Foot Ulcer  Home DM Meds: Lantus 65 units QHS       Novolog SSI  Current Insulin Orders: Lantus 65 units QHS      Novolog Moderate Correction Scale/ SSI (0-15 units) TID AC        MD- Note patient last received Lantus 65 units on 08/30/16 at an unknown time per Home Med Rec.  CBG only 80 mg/dl this AM.  Did not get any Lantus last PM.  May consider reducing Lantus for now until we see more of her CBG trends in hospital-  Recommend:  1. Reduce Lantus to 32 units QHS (50% home dose to start for now)  2. Reduce Novolog Correction Scale/ SSI to Sensitive scale (0-9 units) TID AC + HS       --Will follow patient during hospitalization--  Wyn Quaker RN, MSN, CDE Diabetes Coordinator Inpatient Glycemic Control Team Team Pager: [phone] (8a-5p)

## 2016-09-01 NOTE — ED Provider Notes (Signed)
Ct head negative Vitals appropriate Pt awake/alert, no focal weakness but apparently unable to walk which is new for her Per chart, dermatology is working up T-cell lymphoma just started MTX Due to lack of balance/fall risk will admit D/w dr Lorin Mercy for admission    Ripley Fraise, MD 09/01/16 [phone]

## 2016-09-01 NOTE — Evaluation (Signed)
Physical Therapy Evaluation Patient Details Name: [name] MRN: [id number] DOB: [birth year] Today's Date: 09/01/2016   History of Present Illness   [name] is a 83 y.o. female with medical history significant of HTN, DM, CHF (admission in 11/17, preserved EF with grade 2 diastolic dysfunction), remote colon CA and anxiety presenting with progressive weakness and dizziness.  She has had a long-standing rash and saw dermatology at [facility] on 2/5; she was diagnosed with erythroderma and biopsy indicates that it is from psoriasis vulgaris.  She was given topicals for treatment as well as methotrexate.  Her granddaughter, who provided the history because the patient is extremely hard of hearing, reports that the patient hasn't been leaving home or going anywhere.  She has been okay, just not leaving the house.  [place] backwards a few days ago.  The last few days she hasn't been herself.  Earlier today, she fell twice.  Granddaughter checked on her, she was in the floor.  Got her up, vomited, she fell again later.  Granddaughter had to help her to the bathroom, which is very unusual for the patient.  Did not have problems with falls prior to Methotrexate.  +dizzy.  Generalized weakness, difficulty standing.  Has been less mobile since shde received the medication.  More tired, fatigued, sleeping.  Remains cognitively intact, just very hard of hearing.    Clinical Impression  Ms. Womeldorf has noted nystagmus with smooth pursuit and saccades testing.  She is also has a positive Rt [employer], negative left.  Therapist completed one Canalith repositioning which the patient tolerated well.  She will benefit  From repeated canalith repositioning prior to DC.  When home health is set up make sure it is with a vestibular therapist.     Follow Up Recommendations Home health PT    Equipment Recommendations  Rolling walker with 5" wheels    Recommendations for Other Services       Precautions /  Restrictions Precautions Precautions: None Restrictions Weight Bearing Restrictions: No      Mobility  Bed Mobility Overal bed mobility: Independent                Transfers Overall transfer level:  (Once we stood pt had a BM nurses notified unable to ambulate. )                  Ambulation/Gait                  Balance Overall balance assessment:  (sitting I; )                                           Pertinent Vitals/Pain Pain Assessment: 0-10 Pain Score: 3  Pain Location: Lt hip, (chronic pain) Pain Descriptors / Indicators: Aching Pain Intervention(s): Monitored during session;Limited activity within patient's tolerance    Home Living Family/patient expects to be discharged to:: Private residence Living Arrangements: Alone Available Help at Discharge: Family Type of Home: House Home Access: Stairs to enter Entrance Stairs-Rails: Right Entrance Stairs-Number of Steps: 4 Home Layout: One level Home Equipment: Environmental consultant - 2 wheels      Prior Function Level of Independence: Independent with assistive device(s)                  Extremity/Trunk Assessment        Lower Extremity Assessment Lower Extremity  Assessment: Overall WFL for tasks assessed       Communication   Communication: No difficulties  Cognition Arousal/Alertness: Awake/alert Behavior During Therapy: WFL for tasks assessed/performed Overall Cognitive Status: Within Functional Limits for tasks assessed                         Exercises General Exercises - Lower Extremity Heel Slides:  (bridging x 10 ) Hip ABduction/ADduction: 10 reps Straight Leg Raises: Both;10 reps Other Exercises Other Exercises: smooth pursuits horizontal x 5; cervical rotation x 5 with noted Rt nystagmus.      Assessment/Plan    PT Assessment Patient needs continued PT services  PT Problem List Decreased activity tolerance       PT Treatment Interventions  Manual techniques;Gait training (PT needs canalith repositioning for Rt posterior canal. )    PT Goals (Current goals can be found in the Care Plan section)  Acute Rehab PT Goals Patient Stated Goal: To go home  PT Goal Formulation: With patient/family Time For Goal Achievement: 09/03/16 Potential to Achieve Goals: Good    Frequency Min 5X/week           End of Session Equipment Utilized During Treatment: Gait belt Activity Tolerance: Patient tolerated treatment well Patient left: in bed Nurse Communication: Mobility status PT Visit Diagnosis: History of falling (Z91.81);Pain    Functional Assessment Tool Used: AM-PAC 6 Clicks Basic Mobility Functional Limitation: Mobility: Walking and moving around Mobility: Walking and Moving Around Current Status [phone]): At least 20 percent but less than 40 percent impaired, limited or restricted Mobility: Walking and Moving Around Goal Status [phone]): At least 20 percent but less than 40 percent impaired, limited or restricted Mobility: Walking and Moving Around Discharge Status [phone]): At least 20 percent but less than 40 percent impaired, limited or restricted    Time: 1040-1130 PT Time Calculation (min) (ACUTE ONLY): 50 min   Charges:   PT Evaluation $PT Eval Moderate Complexity: 1 Procedure PT Treatments $Canalith Rep Proc: 8-22 mins   PT G Codes:   PT G-Codes **NOT FOR INPATIENT CLASS** Functional Assessment Tool Used: AM-PAC 6 Clicks Basic Mobility Functional Limitation: Mobility: Walking and moving around Mobility: Walking and Moving Around Current Status [id number]): At least 20 percent but less than 40 percent impaired, limited or restricted Mobility: Walking and Moving Around Goal Status [phone]): At least 20 percent but less than 40 percent impaired, limited or restricted Mobility: Walking and Moving Around Discharge Status [phone]): At least 20 percent but less than 40 percent impaired, limited or restricted     Rayetta Humphrey, PT CLT [phone] 09/01/2016, 11:46 AM

## 2016-09-02 DIAGNOSIS — R531 Weakness: Secondary | ICD-10-CM | POA: Diagnosis not present

## 2016-09-02 DIAGNOSIS — E669 Obesity, unspecified: Secondary | ICD-10-CM | POA: Diagnosis not present

## 2016-09-02 DIAGNOSIS — E1169 Type 2 diabetes mellitus with other specified complication: Secondary | ICD-10-CM

## 2016-09-02 LAB — GLUCOSE, CAPILLARY
GLUCOSE-CAPILLARY: 100 mg/dL — AB (ref 65–99)
Glucose-Capillary: 147 mg/dL — ABNORMAL HIGH (ref 65–99)

## 2016-09-02 LAB — HEMOGLOBIN A1C
Hgb A1c MFr Bld: 7 % — ABNORMAL HIGH (ref 4.8–5.6)
Mean Plasma Glucose: 154 mg/dL

## 2016-09-02 MED ORDER — AMOXICILLIN-POT CLAVULANATE 500-125 MG PO TABS: 1.0000 | ORAL_TABLET | Freq: Two times a day (BID) | ORAL | 0 refills | Status: DC

## 2016-09-02 NOTE — Progress Notes (Signed)
IV Removed, WNL. D/C instructions explained to pt and niece. Pt going home via niece.

## 2016-09-02 NOTE — Discharge Summary (Signed)
Physician Discharge Summary  [name] [id number] DOB: 1931/05/15 DOA: 08/31/2016  PCP: Wende Neighbors, MD  Admit date: 08/31/2016 Discharge date: 09/02/2016  Admitted From: Home.  Disposition:  To home.   Recommendations for Outpatient Follow-up:  1. Follow up with PCP in 1-2 weeks  Home Health: none.  Equipment/Devices: None.  Discharge Condition: No vertigo.  CODE STATUS:FULL CODE.  Diet recommendation: As tolerated.   Brief/Interim Summary: patient was admitted for vertigo on Sep 01, 2016 by Dr Lorin Mercy.  As per her H and P:  " [name] is a 83 y.o. female with medical history significant of HTN, DM, CHF (admission in 11/17, preserved EF with grade 2 diastolic dysfunction), remote colon CA and anxiety presenting with progressive weakness and dizziness.  She has had a long-standing rash and saw dermatology at [facility] on 2/5; she was diagnosed with erythroderma and biopsy indicates that it is from psoriasis vulgaris.  She was given topicals for treatment as well as methotrexate.  Her granddaughter, who provided the history because the patient is extremely hard of hearing, reports that the patient hasn't been leaving home or going anywhere.  She has been okay, just not leaving the house.  [place] backwards a few days ago.  The last few days she hasn't been herself.  Earlier today, she fell twice.  Granddaughter checked on her, she was in the floor.  Got her up, vomited, she fell again later.  Granddaughter had to help her to the bathroom, which is very unusual for the patient.  Did not have problems with falls prior to Methotrexate.  +dizzy.  Generalized weakness, difficulty standing.  Has been less mobile since shde received the medication.  More tired, fatigued, sleeping.  Remains cognitively intact, just very hard of hearing.    Rash has been ongoing for about a year.  Erythroderma, broad ddx.  Rash has ben improving with the methotrezxate.    Complaining of pain in the right hip area  posteriorly following fall.  Has a diabetic foot ulcer on her toe, saw Dr. Juel Burrow office for this and was treated with antibiotics.  No fevers.  Feels very cold at times, dermatology suggested that was related to her rash.   ED Course: 1L bolus, head CT  Review of Systems: As per HPI; otherwise 10 point review of systems reviewed and negative.   Ambulatory Status:  Ambulatory prior to recently  HOSPITAL COURSE:  83 yo HOH, with hx of HTN, DM, CHF with preserved Fx, remote CA, admitted with vertigo, fall, and was having a foot ulcer. An MRI of the foot showed no osteo.  CT of the hip was negative for Fx.  By her description of her symptoms, she likely has peripheral vertigo. She was evaluated by PT, and PT recommended HHT.  Because of the temporal relation of her methyltrexate and her symptom complex, it was postulated that it may be causing her sideeffects, though it is not a common described side effect of MTx.  It was held, and the following day, she was feeling well.  She is now anxious to go home, and will be discharged to home with HHT.  Her MTx will not be continued.  She will follow up with her PCP and her dermatologist.  She will continue her Augmentin for her diabetic foot ulcer.   Thank you and Good Day.   Discharge Diagnoses:  Principal Problem:   Weakness Active Problems:   Diabetes mellitus type 2 in obese (HCC)   Dizziness  Psoriasis vulgaris   Right hip pain   Diabetic foot ulcer ([place])    Discharge Instructions  Discharge Instructions    Diet - low sodium heart healthy    Complete by:  As directed    Discharge instructions    Complete by:  As directed    Follow up with your dermatologist as recommended. Follow up with your PCP next week.   Increase activity slowly    Complete by:  As directed      Allergies as of 09/02/2016      Reactions   Ace Inhibitors Dermatitis   Neosporin [neomycin-bacitracin Zn-polymyx] Dermatitis   Diovan [valsartan] Rash       Medication List    STOP taking these medications   hydrOXYzine 25 MG capsule Commonly known as:  VISTARIL   methotrexate 2.5 MG tablet Commonly known as:  RHEUMATREX   silver sulfADIAZINE 1 % cream Commonly known as:  SILVADENE   spironolactone 25 MG tablet Commonly known as:  ALDACTONE     TAKE these medications   albuterol (2.5 MG/3ML) 0.083% nebulizer solution Commonly known as:  PROVENTIL Take 2.5 mg by nebulization every 4 (four) hours as needed for wheezing or shortness of breath (use every 4 to 6 hrs as needed).   ALPRAZolam 0.5 MG tablet Commonly known as:  XANAX Take 0.5 mg by mouth 3 (three) times daily as needed for anxiety.   amLODipine 5 MG tablet Commonly known as:  NORVASC Take 5 mg by mouth daily.   amoxicillin-clavulanate 500-125 MG tablet Commonly known as:  AUGMENTIN Take 1 tablet (500 mg total) by mouth every 12 (twelve) hours.   clobetasol cream 0.05 % Commonly known as:  TEMOVATE Apply 1 application topically 2 (two) times daily as needed. Do not put on face, can only use for 2 weeks continuous at a time   folic acid 1 MG tablet Commonly known as:  FOLVITE Take 1 mg by mouth daily.   furosemide 40 MG tablet Commonly known as:  LASIX Take 1 tablet (40 mg total) by mouth daily.   insulin aspart 100 UNIT/ML injection Commonly known as:  novoLOG Inject into the skin 3 (three) times daily as needed for high blood sugar. Per sliding scale instructions from based on blood sugar levels   insulin glargine 100 UNIT/ML injection Commonly known as:  LANTUS Inject 65 Units into the skin at bedtime.   labetalol 100 MG tablet Commonly known as:  NORMODYNE Take 100 mg by mouth 2 (two) times daily.   pravastatin 40 MG tablet Commonly known as:  PRAVACHOL Take 40 mg by mouth daily.   triamcinolone cream 0.1 % Commonly known as:  KENALOG APPLY TWICE DAILY            Durable Medical Equipment        Start     Ordered   09/01/16 1430  For  home use only DME Walker rolling  Once    Comments:  Four wheels with seat  Question:  Patient needs a walker to treat with the following condition  Answer:  Weakness   09/01/16 1430      Allergies  Allergen Reactions  . Ace Inhibitors Dermatitis  . Neosporin [Neomycin-Bacitracin Zn-Polymyx] Dermatitis  . Diovan [Valsartan] Rash    Consultations:  None.    Procedures/Studies: Dg Chest 2 View  Result Date: 08/31/2016 CLINICAL DATA:  Right hip pain after a fall EXAM: CHEST  2 VIEW COMPARISON:  05/15/2016 FINDINGS: Mild basilar atelectasis on the  left. No large effusion. Borderline to mild cardiomegaly with atherosclerosis. No pneumothorax. IMPRESSION: 1. Hazy basilar atelectasis on the left 2. Mild cardiomegaly without overt failure Electronically Signed   By: Donavan Foil M.D.   On: 08/31/2016 23:47   Ct Head Wo Contrast  Result Date: 09/01/2016 CLINICAL DATA:  Weakness, fall in dizzy EXAM: CT HEAD WITHOUT CONTRAST TECHNIQUE: Contiguous axial images were obtained from the base of the skull through the vertex without intravenous contrast. COMPARISON:  None. FINDINGS: Brain: Mild motion artifact. No acute territorial infarction or intracranial hemorrhage is visualized. No focal mass, mass effect or midline shift. Mild periventricular white matter small vessel change. Mild atrophy. Ventricles nonenlarged. Vascular: No hyperdense vessels.  No unexpected calcifications. Skull: Trace fluid in the inferior mastoids.  No skull fracture. Sinuses/Orbits: Mild mucosal thickening in the ethmoid and maxillary sinuses. No acute orbital abnormality. Other: None IMPRESSION: No definite CT evidence for acute intracranial abnormality. Electronically Signed   By: Donavan Foil M.D.   On: 09/01/2016 01:02   Ct Hip Right Wo Contrast  Result Date: 09/01/2016 CLINICAL DATA:  83 year old female with persistent right pain. EXAM: CT OF THE RIGHT HIP WITHOUT CONTRAST TECHNIQUE: Multidetector CT imaging of the  right hip was performed according to the standard protocol. Multiplanar CT image reconstructions were also generated. COMPARISON:  Right hip radiograph dated 08/31/2016 and CT of the abdomen pelvis dated 06/18/2016 FINDINGS: Bones/Joint/Cartilage There is no acute fracture or dislocation. The bones are mildly osteopenic. There is mild degenerative changes of the right hip with osteophyte formation. Ligaments Suboptimally assessed by CT. Muscles and Tendons There are calcific changes of the attachment of the hamstring tendon to the ischial tuberosity. Soft tissues Focal induration and stranding of the subcutaneous soft tissues of the right gluteal region. No fluid collection. Multiple mildly prominent and top-normal lymph nodes in the right inguinal region, likely reactive. Correlation with clinical exam recommended. There is sigmoid diverticulosis. Multiple surgical clips noted within pelvis. IMPRESSION: 1. No acute fracture or dislocation. Osteopenia with mild degenerative changes. 2. Calcification of the attachment of the hamstring tendon to the ischial tuberosity may represent calcific tendinitis. Electronically Signed   By: Anner Crete M.D.   On: 09/01/2016 03:52   Mr Foot Left Wo Contrast  Result Date: 09/01/2016 CLINICAL DATA:  Left foot pain and swelling for 2 days. No known injury. EXAM: MRI OF THE LEFT FOOT WITHOUT CONTRAST TECHNIQUE: Multiplanar, multisequence MR imaging of the left foot was performed. No intravenous contrast was administered. COMPARISON:  None. FINDINGS: Examination is limited by significant patient motion. No obvious stress fracture or destructive bony process. No marrow edema. No MR findings suspicious for septic arthritis, myofasciitis or soft tissue abscess. No significant subcutaneous soft tissue swelling/ edema/ fluid to suggest cellulitis. Thickening and increased signal intensity involving the attachment region of the plantar fascia suggesting plantar fasciitis.  IMPRESSION: Limited examination due to patient motion. Mild scattered degenerative changes for age but no fracture, bone lesion or evidence of septic arthritis or osteomyelitis. Probable chronic plantar fasciitis. Electronically Signed   By: Marijo Sanes M.D.   On: 09/01/2016 10:36   US Arterial Seg Single  Result Date: 09/01/2016 CLINICAL DATA:  83 year old female with nonhealing wound involving the left great toe EXAM: NONINVASIVE PHYSIOLOGIC VASCULAR STUDY OF BILATERAL LOWER EXTREMITIES TECHNIQUE: Evaluation of both lower extremities was performed at rest, including calculation of ankle-brachial indices, multiple segmental pressure evaluation, segmental Doppler and segmental pulse volume recording. COMPARISON:  CT scan of the abdomen and  pelvis 06/18/2016 FINDINGS: Right ABI:  0.98 Left ABI:  0.90 Right Lower Extremity: Normal triphasic arterial waveforms at the ankle. Left Lower Extremity:  Biphasic arterial waveforms at the ankle. IMPRESSION: 1. Normal resting ankle-brachial index an arterial waveforms in the right lower extremity. 2. Left resting ankle-brachial index of 0.90 and biphasic arterial waveforms suggest borderline/mild peripheral arterial disease. Electronically Signed   By: Jacqulynn Cadet M.D.   On: 09/01/2016 10:35   Dg Hip Unilat W Or Wo Pelvis 2-3 Views Right  Result Date: 08/31/2016 CLINICAL DATA:  Right hip pain after fall EXAM: DG HIP (WITH OR WITHOUT PELVIS) 2-3V RIGHT COMPARISON:  None. FINDINGS: Multiple surgical clips in the pelvis. Pubic symphysis appears intact. Degenerative changes of the left hip with osteophytes. The SI joints are symmetric. The right femoral head projects in joint. No fracture. Vascular calcifications. IMPRESSION: No acute osseous abnormality Electronically Signed   By: Donavan Foil M.D.   On: 08/31/2016 23:48       Subjective: Feels well.  No vertigo.   Discharge Exam: Vitals:   09/01/16 2100 09/02/16 0557  BP: (!) 142/59   Pulse: 76    Resp: 16 16  Temp: 98.2 F (36.8 C) 97.8 F (36.6 C)   Vitals:   09/01/16 1446 09/01/16 2016 09/01/16 2100 09/02/16 0557  BP: (!) 136/49  (!) 142/59   Pulse: 72  76   Resp: 16  16 16   Temp: 98.4 F (36.9 C)  98.2 F (36.8 C) 97.8 F (36.6 C)  TempSrc:   Oral Oral  SpO2: 98% 98% 99% 98%  Weight:      Height:        General: Pt is alert, awake, not in acute distress Cardiovascular: RRR, S1/S2 +, no rubs, no gallops Respiratory: CTA bilaterally, no wheezing, no rhonchi Abdominal: Soft, NT, ND, bowel sounds + Extremities: no edema, no cyanosis    The results of significant diagnostics from this hospitalization (including imaging, microbiology, ancillary and laboratory) are listed below for reference.     Microbiology: Recent Results (from the past 240 hour(s))  Blood Cultures x 2 sites     Status: None (Preliminary result)   Collection Time: 09/01/16  4:00 AM  Result Value Ref Range Status   Specimen Description BLOOD RIGHT ARM  Final   Special Requests BOTTLES DRAWN AEROBIC AND ANAEROBIC 8CC  Final   Culture NO GROWTH 1 DAY  Final   Report Status PENDING  Incomplete  Blood Cultures x 2 sites     Status: None (Preliminary result)   Collection Time: 09/01/16  4:13 AM  Result Value Ref Range Status   Specimen Description BLOOD RIGHT HAND  Final   Special Requests BOTTLES DRAWN AEROBIC ONLY 6CC  Final   Culture NO GROWTH 1 DAY  Final   Report Status PENDING  Incomplete  MRSA PCR Screening     Status: None   Collection Time: 09/01/16  6:27 AM  Result Value Ref Range Status   MRSA by PCR NEGATIVE NEGATIVE Final    Comment:        The GeneXpert MRSA Assay (FDA approved for NASAL specimens only), is one component of a comprehensive MRSA colonization surveillance program. It is not intended to diagnose MRSA infection nor to guide or monitor treatment for MRSA infections.      Labs: BNP (last 3 results)  Recent Labs  05/15/16 1404  BNP 385.0*   Basic  Metabolic Panel:  Recent Labs Lab 08/31/16 2239 09/01/16 0400  NA 132* 134*  K 3.9 3.8  CL 100* 103  CO2 22 21*  GLUCOSE 131* 73  BUN 42* 37*  CREATININE 1.45* 1.26*  CALCIUM 8.9 8.5*   Liver Function Tests:  Recent Labs Lab 08/31/16 2239  AST 18  ALT 17  ALKPHOS 61  BILITOT 0.6  PROT 7.9  ALBUMIN 3.2*   No results for input(s): LIPASE, AMYLASE in the last 168 hours. No results for input(s): AMMONIA in the last 168 hours. CBC:  Recent Labs Lab 08/31/16 2239 09/01/16 0400  WBC 17.1* 17.1*  NEUTROABS 14.5*  --   HGB 11.8* 11.4*  HCT 35.4* 33.9*  MCV 85.3 85.2  PLT 368 388   Cardiac Enzymes:  Recent Labs Lab 08/31/16 2239  TROPONINI <0.03   BNP: Invalid input(s): POCBNP CBG:  Recent Labs Lab 09/01/16 0827 09/01/16 1158 09/01/16 1637 09/01/16 2124 09/02/16 0731  GLUCAP 80 163* 160* 255* 100*   D-Dimer No results for input(s): DDIMER in the last 72 hours. Hgb A1c  Recent Labs  09/01/16 0400  HGBA1C 7.0*   Lipid Profile No results for input(s): CHOL, HDL, LDLCALC, TRIG, CHOLHDL, LDLDIRECT in the last 72 hours. Thyroid function studies No results for input(s): TSH, T4TOTAL, T3FREE, THYROIDAB in the last 72 hours.  Invalid input(s): FREET3 Anemia work up No results for input(s): VITAMINB12, FOLATE, FERRITIN, TIBC, IRON, RETICCTPCT in the last 72 hours. Urinalysis    Component Value Date/Time   COLORURINE YELLOW 09/01/2016 0001   APPEARANCEUR CLEAR 09/01/2016 0001   LABSPEC 1.013 09/01/2016 0001   PHURINE 5.0 09/01/2016 0001   GLUCOSEU NEGATIVE 09/01/2016 0001   HGBUR NEGATIVE 09/01/2016 0001   BILIRUBINUR NEGATIVE 09/01/2016 0001   KETONESUR NEGATIVE 09/01/2016 0001   PROTEINUR NEGATIVE 09/01/2016 0001   UROBILINOGEN 0.2 08/16/2010 2011   NITRITE NEGATIVE 09/01/2016 0001   LEUKOCYTESUR NEGATIVE 09/01/2016 0001   Sepsis Labs Invalid input(s): PROCALCITONIN,  WBC,  LACTICIDVEN Microbiology Recent Results (from the past 240  hour(s))  Blood Cultures x 2 sites     Status: None (Preliminary result)   Collection Time: 09/01/16  4:00 AM  Result Value Ref Range Status   Specimen Description BLOOD RIGHT ARM  Final   Special Requests BOTTLES DRAWN AEROBIC AND ANAEROBIC 8CC  Final   Culture NO GROWTH 1 DAY  Final   Report Status PENDING  Incomplete  Blood Cultures x 2 sites     Status: None (Preliminary result)   Collection Time: 09/01/16  4:13 AM  Result Value Ref Range Status   Specimen Description BLOOD RIGHT HAND  Final   Special Requests BOTTLES DRAWN AEROBIC ONLY 6CC  Final   Culture NO GROWTH 1 DAY  Final   Report Status PENDING  Incomplete  MRSA PCR Screening     Status: None   Collection Time: 09/01/16  6:27 AM  Result Value Ref Range Status   MRSA by PCR NEGATIVE NEGATIVE Final    Comment:        The GeneXpert MRSA Assay (FDA approved for NASAL specimens only), is one component of a comprehensive MRSA colonization surveillance program. It is not intended to diagnose MRSA infection nor to guide or monitor treatment for MRSA infections.      Time coordinating discharge: Over 30 minutes  SIGNED:   Orvan Falconer, MD FACP [facility] 09/02/2016, 10:42 AM   If 7PM-7AM, please contact night-coverage [email or web address] Password [email or web address]

## 2016-09-03 DIAGNOSIS — R531 Weakness: Secondary | ICD-10-CM | POA: Diagnosis not present

## 2016-09-03 DIAGNOSIS — Z9181 History of falling: Secondary | ICD-10-CM | POA: Diagnosis not present

## 2016-09-03 DIAGNOSIS — E11621 Type 2 diabetes mellitus with foot ulcer: Secondary | ICD-10-CM | POA: Diagnosis not present

## 2016-09-03 DIAGNOSIS — E1165 Type 2 diabetes mellitus with hyperglycemia: Secondary | ICD-10-CM | POA: Diagnosis not present

## 2016-09-03 DIAGNOSIS — E782 Mixed hyperlipidemia: Secondary | ICD-10-CM | POA: Diagnosis not present

## 2016-09-03 DIAGNOSIS — I10 Essential (primary) hypertension: Secondary | ICD-10-CM | POA: Diagnosis not present

## 2016-09-03 DIAGNOSIS — L400 Psoriasis vulgaris: Secondary | ICD-10-CM | POA: Diagnosis not present

## 2016-09-03 DIAGNOSIS — D509 Iron deficiency anemia, unspecified: Secondary | ICD-10-CM | POA: Diagnosis not present

## 2016-09-05 DIAGNOSIS — E11621 Type 2 diabetes mellitus with foot ulcer: Secondary | ICD-10-CM | POA: Diagnosis not present

## 2016-09-05 DIAGNOSIS — R531 Weakness: Secondary | ICD-10-CM | POA: Diagnosis not present

## 2016-09-05 DIAGNOSIS — I10 Essential (primary) hypertension: Secondary | ICD-10-CM | POA: Diagnosis not present

## 2016-09-05 DIAGNOSIS — Z9181 History of falling: Secondary | ICD-10-CM | POA: Diagnosis not present

## 2016-09-05 DIAGNOSIS — L400 Psoriasis vulgaris: Secondary | ICD-10-CM | POA: Diagnosis not present

## 2016-09-06 LAB — CULTURE, BLOOD (ROUTINE X 2)
CULTURE: NO GROWTH
CULTURE: NO GROWTH

## 2016-09-07 ENCOUNTER — Encounter (HOSPITAL_COMMUNITY): Payer: Self-pay

## 2016-09-07 ENCOUNTER — Emergency Department (HOSPITAL_COMMUNITY): Payer: Medicare Other

## 2016-09-07 ENCOUNTER — Emergency Department (HOSPITAL_COMMUNITY)
Admission: EM | Admit: 2016-09-07 | Discharge: 2016-09-08 | Disposition: A | Discharge status: Home / Self Care | Payer: Medicare Other | Attending: Emergency Medicine | Admitting: Emergency Medicine

## 2016-09-07 DIAGNOSIS — Z794 Long term (current) use of insulin: Secondary | ICD-10-CM | POA: Diagnosis not present

## 2016-09-07 DIAGNOSIS — R600 Localized edema: Secondary | ICD-10-CM | POA: Diagnosis not present

## 2016-09-07 DIAGNOSIS — I110 Hypertensive heart disease with heart failure: Secondary | ICD-10-CM | POA: Insufficient documentation

## 2016-09-07 DIAGNOSIS — E119 Type 2 diabetes mellitus without complications: Secondary | ICD-10-CM | POA: Diagnosis not present

## 2016-09-07 DIAGNOSIS — Z85038 Personal history of other malignant neoplasm of large intestine: Secondary | ICD-10-CM | POA: Diagnosis not present

## 2016-09-07 DIAGNOSIS — Z79899 Other long term (current) drug therapy: Secondary | ICD-10-CM | POA: Insufficient documentation

## 2016-09-07 DIAGNOSIS — M7989 Other specified soft tissue disorders: Secondary | ICD-10-CM | POA: Diagnosis not present

## 2016-09-07 DIAGNOSIS — M25561 Pain in right knee: Secondary | ICD-10-CM

## 2016-09-07 DIAGNOSIS — R531 Weakness: Secondary | ICD-10-CM | POA: Diagnosis not present

## 2016-09-07 DIAGNOSIS — R404 Transient alteration of awareness: Secondary | ICD-10-CM | POA: Diagnosis not present

## 2016-09-07 DIAGNOSIS — I509 Heart failure, unspecified: Secondary | ICD-10-CM | POA: Insufficient documentation

## 2016-09-07 LAB — BASIC METABOLIC PANEL
Anion gap: 10 (ref 5–15)
BUN: 26 mg/dL — ABNORMAL HIGH (ref 6–20)
CO2: 23 mmol/L (ref 22–32)
Calcium: 8.8 mg/dL — ABNORMAL LOW (ref 8.9–10.3)
Chloride: 105 mmol/L (ref 101–111)
Creatinine, Ser: 1.23 mg/dL — ABNORMAL HIGH (ref 0.44–1.00)
GFR calc Af Amer: 46 mL/min — ABNORMAL LOW (ref >60)
GFR calc non Af Amer: 39 mL/min — ABNORMAL LOW (ref >60)
Glucose, Bld: 178 mg/dL — ABNORMAL HIGH (ref 65–99)
Potassium: 3.9 mmol/L (ref 3.5–5.1)
Sodium: 138 mmol/L (ref 135–145)

## 2016-09-07 LAB — CBC WITH DIFFERENTIAL/PLATELET
Basophils Absolute: 0 10*3/uL (ref 0.0–0.1)
Basophils Relative: 0 %
Eosinophils Absolute: 0.6 10*3/uL (ref 0.0–0.7)
Eosinophils Relative: 6 %
HCT: 34.1 % — ABNORMAL LOW (ref 36.0–46.0)
Hemoglobin: 11.2 g/dL — ABNORMAL LOW (ref 12.0–15.0)
Lymphocytes Relative: 17 %
Lymphs Abs: 1.7 10*3/uL (ref 0.7–4.0)
MCH: 28 pg (ref 26.0–34.0)
MCHC: 32.8 g/dL (ref 30.0–36.0)
MCV: 85.3 fL (ref 78.0–100.0)
Monocytes Absolute: 1.4 10*3/uL — ABNORMAL HIGH (ref 0.1–1.0)
Monocytes Relative: 14 %
Neutro Abs: 6.4 10*3/uL (ref 1.7–7.7)
Neutrophils Relative %: 63 %
Platelets: 448 10*3/uL — ABNORMAL HIGH (ref 150–400)
RBC: 4 MIL/uL (ref 3.87–5.11)
RDW: 14.6 % (ref 11.5–15.5)
WBC: 10.2 10*3/uL (ref 4.0–10.5)

## 2016-09-07 MED ORDER — BUPIVACAINE HCL (PF) 0.25 % IJ SOLN
10.0000 mL | Freq: Once | INTRAMUSCULAR | Status: AC
  Administered 2016-09-07: 10 mL
  Filled 2016-09-07: qty 30

## 2016-09-07 MED ORDER — DEXAMETHASONE SODIUM PHOSPHATE 4 MG/ML IJ SOLN
8.0000 mg | Freq: Once | INTRAMUSCULAR | Status: AC
  Administered 2016-09-07: 8 mg via INTRAVENOUS
  Filled 2016-09-07: qty 2

## 2016-09-07 MED ORDER — PREDNISONE 20 MG PO TABS: 20.0000 mg | ORAL_TABLET | Freq: Every day | ORAL | 0 refills | Status: DC

## 2016-09-07 MED ORDER — DIAZEPAM 2 MG PO TABS
2.0000 mg | ORAL_TABLET | Freq: Once | ORAL | Status: AC
  Administered 2016-09-07: 2 mg via ORAL
  Filled 2016-09-07: qty 1

## 2016-09-07 MED ORDER — HYDROCODONE-ACETAMINOPHEN 5-325 MG PO TABS: 1.0000 | ORAL_TABLET | ORAL | 0 refills | Status: DC | PRN

## 2016-09-07 MED ORDER — MORPHINE SULFATE (PF) 4 MG/ML IV SOLN
4.0000 mg | Freq: Once | INTRAVENOUS | Status: AC
  Administered 2016-09-07: 4 mg via INTRAVENOUS
  Filled 2016-09-07: qty 1

## 2016-09-07 NOTE — ED Notes (Signed)
Pt unable to stand by the bedside independently. This RN a NT stood by the pt's side to assist her and pt was not able to put any pressure on her right knee.

## 2016-09-07 NOTE — ED Notes (Signed)
Returned from  X-ray

## 2016-09-07 NOTE — ED Provider Notes (Signed)
[facility] DEPT Provider Note   CSN: [id number] Arrival date & time: 09/07/16  1741  By signing my name below, I, Reola Mosher, attest that this documentation has been prepared under the direction and in the presence of Virgel Manifold, MD. Electronically Signed: Reola Mosher, ED Scribe. 09/07/16. 6:02 PM.  History   Chief Complaint Chief Complaint  Patient presents with  . Leg Pain   The history is provided by the patient and medical records. No language interpreter was used.    HPI Comments: [name] is a 83 y.o. female BIB EMS, with a h/o CHF, DM, HTN, who presents to the Emergency Department complaining of persistent, gradually worsening right upper leg and knee swelling and pain beginning yesterday. She reports associated weakness secondary to her pain and swelling. No recent trauma or injury to the leg. Her pain is exacerbated with ambulation and weight bearing. Pt has been taking Asprin at home without relief of her pain. She states that she has experienced pain associated with this knee in the past, noting that it will intermittently "give away" while ambulating. Per prior chart review, she has had a long-standing bodily rash and [facility]matologyat [facility] on 2/5; she was diagnosed with erythroderma and biopsy indicates that it is from psoriasis vulgaris. Pt was taking Methotrexate, however, she was recently admitted on 08/31/16 (~7 days ago) and upon d/c'd was discontinued from taking this medication. She denies hip/groin pain, fever, or any other associated symptoms.   Past Medical History:  Diagnosis Date  . Anxiety   . Arthritis   . Cancer [facility]) 2006   colon  . Cataract   . CHF (congestive heart failure) ([place]) 05/2016   preserved EF, grade 2 diastolic dysfunction  . Diabetes mellitus without complication ([place])   . Diverticulitis   . Erythroderma desquamativum   . Hypertension    Patient Active Problem List   Diagnosis Date Noted  . Dizziness  09/01/2016  . Weakness 09/01/2016  . Psoriasis vulgaris 09/01/2016  . Right hip pain 09/01/2016  . Diabetic foot ulcer ([place]) 09/01/2016  . Dyspnea 05/15/2016  . Congestive heart failure (CHF) ([place]) 05/15/2016  . Rash and nonspecific skin eruption 05/15/2016  . Essential hypertension 05/15/2016  . Diabetes mellitus type 2 in obese ([place]) 05/15/2016  . Anxiety 05/15/2016  . Acute respiratory failure with hypoxia ([place]) 05/15/2016   Past Surgical History:  Procedure Laterality Date  . ABDOMINAL HYSTERECTOMY    . APPENDECTOMY    . COLON SURGERY     OB History    No data available     Home Medications    Prior to Admission medications   Medication Sig Start Date End Date Taking? Authorizing Provider  albuterol (PROVENTIL) (2.5 MG/3ML) 0.083% nebulizer solution Take 2.5 mg by nebulization every 4 (four) hours as needed for wheezing or shortness of breath (use every 4 to 6 hrs as needed).    Historical Provider, MD  ALPRAZolam Duanne Moron) 0.5 MG tablet Take 0.5 mg by mouth 3 (three) times daily as needed for anxiety.     Historical Provider, MD  amLODipine (NORVASC) 5 MG tablet Take 5 mg by mouth daily.    Historical Provider, MD  amoxicillin-clavulanate (AUGMENTIN) 500-125 MG tablet Take 1 tablet (500 mg total) by mouth every 12 (twelve) hours. 09/02/16   Orvan Falconer, MD  clobetasol cream (TEMOVATE) [id number] % Apply 1 application topically 2 (two) times daily as needed. Do not put on face, can only use for 2 weeks continuous at  a time 03/17/15   Thao P Le, DO  folic acid (FOLVITE) 1 MG tablet Take 1 mg by mouth daily.    Historical Provider, MD  furosemide (LASIX) 40 MG tablet Take 1 tablet (40 mg total) by mouth daily. 05/19/16   Erline Hau, MD  insulin aspart (NOVOLOG) 100 UNIT/ML injection Inject into the skin 3 (three) times daily as needed for high blood sugar. Per sliding scale instructions from based on blood sugar levels    Historical Provider, MD  insulin glargine (LANTUS) 100  UNIT/ML injection Inject 65 Units into the skin at bedtime.     Historical Provider, MD  labetalol (NORMODYNE) 100 MG tablet Take 100 mg by mouth 2 (two) times daily.     Historical Provider, MD  pravastatin (PRAVACHOL) 40 MG tablet Take 40 mg by mouth daily.    Historical Provider, MD  triamcinolone cream (KENALOG) 0.1 % APPLY TWICE DAILY 01/13/12   Rise Mu, PA-C   Family History Family History  Problem Relation Age of Onset  . Stroke Mother   . Diabetes Father   . Cancer Sister   . Diabetes Sister   . Cancer Brother   . Cancer Brother   . Diabetes Sister   . Stroke Sister    Social History Social History  Substance Use Topics  . Smoking status: Never Smoker  . Smokeless tobacco: Never Used  . Alcohol use No   Allergies   Ace inhibitors; Neosporin [neomycin-bacitracin zn-polymyx]; and Diovan [valsartan]  Review of Systems Review of Systems  Constitutional: Negative for fever.  Musculoskeletal: Positive for arthralgias (right knee), joint swelling (right knee) and myalgias.  Skin: Positive for rash.  All other systems reviewed and are negative.  Physical Exam Updated Vital Signs BP 139/70 (BP Location: Right Arm)   Pulse 74   Temp 98.1 F (36.7 C) (Oral)   Resp 20   Ht 5\' 4"  (1.626 m)   Wt 159 lb (72.1 kg)   SpO2 99%   BMI 27.29 kg/m   Physical Exam  Constitutional: She appears well-developed and well-nourished.  HENT:  Head: Normocephalic.  Right Ear: External ear normal.  Left Ear: External ear normal.  Nose: Nose normal.  Mouth/Throat: Oropharynx is clear and moist.  Eyes: Conjunctivae are normal. Right eye exhibits no discharge. Left eye exhibits no discharge.  Neck: Normal range of motion.  Cardiovascular: Normal rate, regular rhythm and normal heart sounds.   No murmur heard. Pulmonary/Chest: Effort normal and breath sounds normal. No respiratory distress. She has no wheezes. She has no rales.  Abdominal: Soft. She exhibits no distension. There is  no tenderness. There is no rebound and no guarding.  Musculoskeletal: She exhibits edema.  There is mild to moderate right knee swelling as compared to the left.   Neurological: She is alert. No cranial nerve deficit. Coordination normal.  Skin: Skin is warm and dry.  Chronic appearing skin changes diffusely from upper chest down into feet.   Psychiatric: She has a normal mood and affect. Her behavior is normal.  Nursing note and vitals reviewed.  ED Treatments / Results  DIAGNOSTIC STUDIES: Oxygen Saturation is 99% on RA, normal by my interpretation.   COORDINATION OF CARE: 6:02 PM-Discussed next steps with pt. Pt verbalized understanding and is agreeable with the plan.   Labs (all labs ordered are listed, but only abnormal results are displayed) Labs Reviewed - No data to display  EKG  EKG Interpretation None  Radiology No results found.  Procedures .Joint Aspiration/Arthrocentesis Date/Time: 09/07/2016 2:58 PM Performed by: Virgel Manifold Authorized by: Virgel Manifold   Consent:    Consent obtained:  Verbal   Consent given by:  Patient   Risks discussed:  Bleeding, infection and pain   Alternatives discussed:  Alternative treatment Location:    Location:  Knee Anesthesia (see MAR for exact dosages):    Anesthesia method:  Local infiltration Procedure details:    Preparation: Patient was prepped and draped in usual sterile fashion     Needle gauge: 21g.   Ultrasound guidance: no     Approach:  Medial   Steroid injected: no     Specimen collected: no   Post-procedure details:    Dressing: bandaid.   Patient tolerance of procedure:  Tolerated well, no immediate complications Comments:     No significant effusion. No fluid aspirated. 5cc of anesthetic injected.        Medications Ordered in ED Medications - No data to display  Initial Impression / Assessment and Plan / ED Course  I have reviewed the triage vital signs and the nursing  notes.  Pertinent labs & imaging results that were available during my care of the patient were reviewed by me and considered in my medical decision making (see chart for details).     85yF with R knee pain. May be from osteoarthritis. I really doubt infectious process/DVT/etc. She does have extensive dermatitis, but this is chronic and R knee doesn't look significantly worse than elsewhere on her body.  I explained to family that the plan was for pain control. I had the impression they were disatisfied with this, but I explained that I did not not feel there was medical necessity to admit her to the hospital. There were two separate discussions where I stated this. I acknowledged their concerns and after the first discussion, I offered to inject her knee for pain relief purposes. Two family members stayed in the room for the procedure and one elected to step out. While I was prepping her knee/injecting it, I continued to talk with them and I reiterated that the next step was outpt FU. She actually had a previously scheduled appointment at [facility] the following day. I subsequently discharged the patient. I felt I had adequately explained the plan.  I received a call on my personal home from staff later after my shift ended stating that the family was upset about discharge.  Final Clinical Impressions(s) / ED Diagnoses   Final diagnoses:  Acute pain of right knee   New Prescriptions Discharge Medication List as of 09/07/2016  9:42 PM    START taking these medications   Details  HYDROcodone-acetaminophen (NORCO/VICODIN) 5-325 MG tablet Take 1 tablet by mouth every 4 (four) hours as needed., Starting Sun 09/07/2016, Print    predniSONE (DELTASONE) 20 MG tablet Take 1 tablet (20 mg total) by mouth daily., Starting Sun 09/07/2016, Print          Virgel Manifold, MD 09/22/16 1459

## 2016-09-07 NOTE — ED Triage Notes (Addendum)
Pt brought in  By EMS due to pain and swelling in right leg since yesterday. Leg noted to have patchy red and scaly areas with scabbed areas. Pt in unable to bend knee. Pt has open areas to right lower lateral leg with green drainage on bandages

## 2016-09-08 DIAGNOSIS — M6281 Muscle weakness (generalized): Secondary | ICD-10-CM | POA: Diagnosis not present

## 2016-09-08 NOTE — ED Notes (Addendum)
This RN spoke with the family and they were upset because no one had told them that the pt was being discharged. Dr. Wilson Singer was called personally by me on his cell phone and he states that the pt and family were advised of the plan. The pt's family were requesting that the pt be placed at the [facility] for rehabilitation. Family made aware that the pt's PCP could help them with their request. Family still unhappy to have to take the pt home. Pt has a appt at [facility] in the morning.

## 2016-09-08 NOTE — ED Notes (Signed)
Pt was wheeled out by this RN and Octavia Bruckner, [facility] was walking with me and the family. Pt was helped in the car by Tim, [facility] and was able to help him get her in the car.

## 2016-09-11 DIAGNOSIS — J708 Respiratory conditions due to other specified external agents: Secondary | ICD-10-CM | POA: Diagnosis not present

## 2016-09-11 DIAGNOSIS — J45998 Other asthma: Secondary | ICD-10-CM | POA: Diagnosis not present

## 2016-09-12 DIAGNOSIS — L400 Psoriasis vulgaris: Secondary | ICD-10-CM | POA: Diagnosis not present

## 2016-09-12 DIAGNOSIS — R531 Weakness: Secondary | ICD-10-CM | POA: Diagnosis not present

## 2016-09-12 DIAGNOSIS — Z9181 History of falling: Secondary | ICD-10-CM | POA: Diagnosis not present

## 2016-09-12 DIAGNOSIS — I10 Essential (primary) hypertension: Secondary | ICD-10-CM | POA: Diagnosis not present

## 2016-09-12 DIAGNOSIS — E11621 Type 2 diabetes mellitus with foot ulcer: Secondary | ICD-10-CM | POA: Diagnosis not present

## 2016-09-17 DIAGNOSIS — R531 Weakness: Secondary | ICD-10-CM | POA: Diagnosis not present

## 2016-09-17 DIAGNOSIS — E11621 Type 2 diabetes mellitus with foot ulcer: Secondary | ICD-10-CM | POA: Diagnosis not present

## 2016-09-17 DIAGNOSIS — L400 Psoriasis vulgaris: Secondary | ICD-10-CM | POA: Diagnosis not present

## 2016-09-17 DIAGNOSIS — Z9181 History of falling: Secondary | ICD-10-CM | POA: Diagnosis not present

## 2016-09-17 DIAGNOSIS — I10 Essential (primary) hypertension: Secondary | ICD-10-CM | POA: Diagnosis not present

## 2016-09-19 DIAGNOSIS — E11621 Type 2 diabetes mellitus with foot ulcer: Secondary | ICD-10-CM | POA: Diagnosis not present

## 2016-09-19 DIAGNOSIS — R531 Weakness: Secondary | ICD-10-CM | POA: Diagnosis not present

## 2016-09-19 DIAGNOSIS — Z9181 History of falling: Secondary | ICD-10-CM | POA: Diagnosis not present

## 2016-09-19 DIAGNOSIS — I10 Essential (primary) hypertension: Secondary | ICD-10-CM | POA: Diagnosis not present

## 2016-09-19 DIAGNOSIS — L400 Psoriasis vulgaris: Secondary | ICD-10-CM | POA: Diagnosis not present

## 2016-09-26 DIAGNOSIS — E11621 Type 2 diabetes mellitus with foot ulcer: Secondary | ICD-10-CM | POA: Diagnosis not present

## 2016-09-26 DIAGNOSIS — L400 Psoriasis vulgaris: Secondary | ICD-10-CM | POA: Diagnosis not present

## 2016-09-26 DIAGNOSIS — Z9181 History of falling: Secondary | ICD-10-CM | POA: Diagnosis not present

## 2016-09-26 DIAGNOSIS — I10 Essential (primary) hypertension: Secondary | ICD-10-CM | POA: Diagnosis not present

## 2016-09-26 DIAGNOSIS — R531 Weakness: Secondary | ICD-10-CM | POA: Diagnosis not present

## 2016-10-12 DIAGNOSIS — J45998 Other asthma: Secondary | ICD-10-CM | POA: Diagnosis not present

## 2016-10-12 DIAGNOSIS — J708 Respiratory conditions due to other specified external agents: Secondary | ICD-10-CM | POA: Diagnosis not present

## 2016-11-11 DIAGNOSIS — J708 Respiratory conditions due to other specified external agents: Secondary | ICD-10-CM | POA: Diagnosis not present

## 2016-11-11 DIAGNOSIS — J45998 Other asthma: Secondary | ICD-10-CM | POA: Diagnosis not present

## 2016-11-25 DIAGNOSIS — L409 Psoriasis, unspecified: Secondary | ICD-10-CM | POA: Diagnosis not present

## 2016-11-25 DIAGNOSIS — L408 Other psoriasis: Secondary | ICD-10-CM | POA: Diagnosis not present

## 2016-12-12 DIAGNOSIS — J45998 Other asthma: Secondary | ICD-10-CM | POA: Diagnosis not present

## 2016-12-12 DIAGNOSIS — J708 Respiratory conditions due to other specified external agents: Secondary | ICD-10-CM | POA: Diagnosis not present

## 2017-01-11 DIAGNOSIS — J708 Respiratory conditions due to other specified external agents: Secondary | ICD-10-CM | POA: Diagnosis not present

## 2017-01-11 DIAGNOSIS — J45998 Other asthma: Secondary | ICD-10-CM | POA: Diagnosis not present

## 2017-01-21 DIAGNOSIS — E1165 Type 2 diabetes mellitus with hyperglycemia: Secondary | ICD-10-CM | POA: Diagnosis not present

## 2017-01-21 DIAGNOSIS — I10 Essential (primary) hypertension: Secondary | ICD-10-CM | POA: Diagnosis not present

## 2017-01-24 DIAGNOSIS — H1045 Other chronic allergic conjunctivitis: Secondary | ICD-10-CM | POA: Diagnosis not present

## 2017-01-26 DIAGNOSIS — I10 Essential (primary) hypertension: Secondary | ICD-10-CM | POA: Diagnosis not present

## 2017-01-26 DIAGNOSIS — N183 Chronic kidney disease, stage 3 (moderate): Secondary | ICD-10-CM | POA: Diagnosis not present

## 2017-01-26 DIAGNOSIS — D509 Iron deficiency anemia, unspecified: Secondary | ICD-10-CM | POA: Diagnosis not present

## 2017-01-26 DIAGNOSIS — E782 Mixed hyperlipidemia: Secondary | ICD-10-CM | POA: Diagnosis not present

## 2017-01-26 DIAGNOSIS — E1165 Type 2 diabetes mellitus with hyperglycemia: Secondary | ICD-10-CM | POA: Diagnosis not present

## 2017-01-28 DIAGNOSIS — H26493 Other secondary cataract, bilateral: Secondary | ICD-10-CM | POA: Diagnosis not present

## 2017-02-09 DIAGNOSIS — H01115 Allergic dermatitis of left lower eyelid: Secondary | ICD-10-CM | POA: Diagnosis not present

## 2017-02-09 DIAGNOSIS — H01112 Allergic dermatitis of right lower eyelid: Secondary | ICD-10-CM | POA: Diagnosis not present

## 2017-02-09 DIAGNOSIS — H01111 Allergic dermatitis of right upper eyelid: Secondary | ICD-10-CM | POA: Diagnosis not present

## 2017-02-09 DIAGNOSIS — H01114 Allergic dermatitis of left upper eyelid: Secondary | ICD-10-CM | POA: Diagnosis not present

## 2017-02-11 DIAGNOSIS — H01111 Allergic dermatitis of right upper eyelid: Secondary | ICD-10-CM | POA: Diagnosis not present

## 2017-02-11 DIAGNOSIS — J708 Respiratory conditions due to other specified external agents: Secondary | ICD-10-CM | POA: Diagnosis not present

## 2017-02-11 DIAGNOSIS — H01114 Allergic dermatitis of left upper eyelid: Secondary | ICD-10-CM | POA: Diagnosis not present

## 2017-02-11 DIAGNOSIS — J45998 Other asthma: Secondary | ICD-10-CM | POA: Diagnosis not present

## 2017-02-11 DIAGNOSIS — H01115 Allergic dermatitis of left lower eyelid: Secondary | ICD-10-CM | POA: Diagnosis not present

## 2017-02-11 DIAGNOSIS — H01112 Allergic dermatitis of right lower eyelid: Secondary | ICD-10-CM | POA: Diagnosis not present

## 2017-02-16 ENCOUNTER — Ambulatory Visit: Payer: Medicare Other | Admitting: Podiatry

## 2017-02-22 ENCOUNTER — Inpatient Hospital Stay (HOSPITAL_COMMUNITY): Payer: Medicare Other

## 2017-02-22 ENCOUNTER — Inpatient Hospital Stay (HOSPITAL_COMMUNITY)
Admission: EM | Admit: 2017-02-22 | Discharge: 2017-02-28 | DRG: 389 | Disposition: A | Discharge status: Home Health | Payer: Medicare Other | Attending: Internal Medicine | Admitting: Internal Medicine

## 2017-02-22 ENCOUNTER — Emergency Department (HOSPITAL_COMMUNITY): Payer: Medicare Other

## 2017-02-22 ENCOUNTER — Encounter (HOSPITAL_COMMUNITY): Payer: Self-pay

## 2017-02-22 DIAGNOSIS — I110 Hypertensive heart disease with heart failure: Secondary | ICD-10-CM | POA: Diagnosis present

## 2017-02-22 DIAGNOSIS — E119 Type 2 diabetes mellitus without complications: Secondary | ICD-10-CM | POA: Diagnosis present

## 2017-02-22 DIAGNOSIS — Z888 Allergy status to other drugs, medicaments and biological substances status: Secondary | ICD-10-CM | POA: Diagnosis not present

## 2017-02-22 DIAGNOSIS — Z66 Do not resuscitate: Secondary | ICD-10-CM | POA: Diagnosis present

## 2017-02-22 DIAGNOSIS — H9190 Unspecified hearing loss, unspecified ear: Secondary | ICD-10-CM | POA: Diagnosis not present

## 2017-02-22 DIAGNOSIS — R109 Unspecified abdominal pain: Secondary | ICD-10-CM | POA: Diagnosis not present

## 2017-02-22 DIAGNOSIS — E860 Dehydration: Secondary | ICD-10-CM | POA: Diagnosis not present

## 2017-02-22 DIAGNOSIS — Z833 Family history of diabetes mellitus: Secondary | ICD-10-CM | POA: Diagnosis not present

## 2017-02-22 DIAGNOSIS — R339 Retention of urine, unspecified: Secondary | ICD-10-CM | POA: Diagnosis present

## 2017-02-22 DIAGNOSIS — Z79899 Other long term (current) drug therapy: Secondary | ICD-10-CM | POA: Diagnosis not present

## 2017-02-22 DIAGNOSIS — Z90710 Acquired absence of both cervix and uterus: Secondary | ICD-10-CM | POA: Diagnosis not present

## 2017-02-22 DIAGNOSIS — Z85038 Personal history of other malignant neoplasm of large intestine: Secondary | ICD-10-CM

## 2017-02-22 DIAGNOSIS — I10 Essential (primary) hypertension: Secondary | ICD-10-CM

## 2017-02-22 DIAGNOSIS — K5669 Other partial intestinal obstruction: Secondary | ICD-10-CM | POA: Diagnosis not present

## 2017-02-22 DIAGNOSIS — F419 Anxiety disorder, unspecified: Secondary | ICD-10-CM | POA: Diagnosis present

## 2017-02-22 DIAGNOSIS — E1169 Type 2 diabetes mellitus with other specified complication: Secondary | ICD-10-CM

## 2017-02-22 DIAGNOSIS — K56609 Unspecified intestinal obstruction, unspecified as to partial versus complete obstruction: Secondary | ICD-10-CM | POA: Diagnosis present

## 2017-02-22 DIAGNOSIS — R140 Abdominal distension (gaseous): Secondary | ICD-10-CM | POA: Diagnosis not present

## 2017-02-22 DIAGNOSIS — E871 Hypo-osmolality and hyponatremia: Secondary | ICD-10-CM | POA: Diagnosis present

## 2017-02-22 DIAGNOSIS — E669 Obesity, unspecified: Secondary | ICD-10-CM | POA: Diagnosis present

## 2017-02-22 DIAGNOSIS — Z6828 Body mass index (BMI) 28.0-28.9, adult: Secondary | ICD-10-CM | POA: Diagnosis not present

## 2017-02-22 DIAGNOSIS — I160 Hypertensive urgency: Secondary | ICD-10-CM | POA: Diagnosis present

## 2017-02-22 DIAGNOSIS — K5790 Diverticulosis of intestine, part unspecified, without perforation or abscess without bleeding: Secondary | ICD-10-CM | POA: Diagnosis not present

## 2017-02-22 DIAGNOSIS — D72829 Elevated white blood cell count, unspecified: Secondary | ICD-10-CM | POA: Diagnosis not present

## 2017-02-22 DIAGNOSIS — I5032 Chronic diastolic (congestive) heart failure: Secondary | ICD-10-CM | POA: Diagnosis not present

## 2017-02-22 DIAGNOSIS — R338 Other retention of urine: Secondary | ICD-10-CM

## 2017-02-22 DIAGNOSIS — Z7952 Long term (current) use of systemic steroids: Secondary | ICD-10-CM

## 2017-02-22 DIAGNOSIS — K5651 Intestinal adhesions [bands], with partial obstruction: Secondary | ICD-10-CM | POA: Diagnosis not present

## 2017-02-22 DIAGNOSIS — Z794 Long term (current) use of insulin: Secondary | ICD-10-CM

## 2017-02-22 DIAGNOSIS — R42 Dizziness and giddiness: Secondary | ICD-10-CM | POA: Diagnosis not present

## 2017-02-22 DIAGNOSIS — E1165 Type 2 diabetes mellitus with hyperglycemia: Secondary | ICD-10-CM | POA: Diagnosis present

## 2017-02-22 LAB — COMPREHENSIVE METABOLIC PANEL
ALBUMIN: 3.5 g/dL (ref 3.5–5.0)
ALT: 24 U/L (ref 14–54)
AST: 18 U/L (ref 15–41)
Alkaline Phosphatase: 55 U/L (ref 38–126)
Anion gap: 10 (ref 5–15)
BUN: 31 mg/dL — AB (ref 6–20)
CHLORIDE: 94 mmol/L — AB (ref 101–111)
CO2: 25 mmol/L (ref 22–32)
CREATININE: 0.89 mg/dL (ref 0.44–1.00)
Calcium: 8.8 mg/dL — ABNORMAL LOW (ref 8.9–10.3)
GFR calc Af Amer: >60 mL/min (ref >60)
GFR, EST NON AFRICAN AMERICAN: 58 mL/min — AB (ref >60)
GLUCOSE: 201 mg/dL — AB (ref 65–99)
POTASSIUM: 4.1 mmol/L (ref 3.5–5.1)
Sodium: 129 mmol/L — ABNORMAL LOW (ref 135–145)
Total Bilirubin: 1.2 mg/dL (ref 0.3–1.2)
Total Protein: 7.6 g/dL (ref 6.5–8.1)

## 2017-02-22 LAB — CBC
HEMATOCRIT: 42.6 % (ref 36.0–46.0)
Hemoglobin: 15.3 g/dL — ABNORMAL HIGH (ref 12.0–15.0)
MCH: 30 pg (ref 26.0–34.0)
MCHC: 35.9 g/dL (ref 30.0–36.0)
MCV: 83.5 fL (ref 78.0–100.0)
PLATELETS: 264 10*3/uL (ref 150–400)
RBC: 5.1 MIL/uL (ref 3.87–5.11)
RDW: 13.2 % (ref 11.5–15.5)
WBC: 21.2 10*3/uL — ABNORMAL HIGH (ref 4.0–10.5)

## 2017-02-22 LAB — LIPASE, BLOOD: LIPASE: 22 U/L (ref 11–51)

## 2017-02-22 LAB — GLUCOSE, CAPILLARY
GLUCOSE-CAPILLARY: 191 mg/dL — AB (ref 65–99)
Glucose-Capillary: 192 mg/dL — ABNORMAL HIGH (ref 65–99)

## 2017-02-22 LAB — I-STAT CHEM 8, ED
BUN: 32 mg/dL — ABNORMAL HIGH (ref 6–20)
CALCIUM ION: 1.1 mmol/L — AB (ref 1.15–1.40)
CREATININE: 0.8 mg/dL (ref 0.44–1.00)
Chloride: 98 mmol/L — ABNORMAL LOW (ref 101–111)
GLUCOSE: 198 mg/dL — AB (ref 65–99)
HCT: 45 % (ref 36.0–46.0)
HEMOGLOBIN: 15.3 g/dL — AB (ref 12.0–15.0)
POTASSIUM: 4.2 mmol/L (ref 3.5–5.1)
Sodium: 132 mmol/L — ABNORMAL LOW (ref 135–145)
TCO2: 23 mmol/L (ref 0–100)

## 2017-02-22 LAB — LACTIC ACID, PLASMA: Lactic Acid, Venous: 0.9 mmol/L (ref 0.5–1.9)

## 2017-02-22 MED ORDER — ALBUTEROL SULFATE (2.5 MG/3ML) 0.083% IN NEBU
2.5000 mg | INHALATION_SOLUTION | RESPIRATORY_TRACT | Status: DC | PRN
  Administered 2017-02-27: 2.5 mg via RESPIRATORY_TRACT
  Filled 2017-02-22: qty 3

## 2017-02-22 MED ORDER — INSULIN ASPART 100 UNIT/ML ~~LOC~~ SOLN
0.0000 [IU] | SUBCUTANEOUS | Status: DC
  Administered 2017-02-22 – 2017-02-23 (×2): 2 [IU] via SUBCUTANEOUS
  Administered 2017-02-23 – 2017-02-25 (×3): 1 [IU] via SUBCUTANEOUS

## 2017-02-22 MED ORDER — INSULIN ASPART 100 UNIT/ML ~~LOC~~ SOLN
0.0000 [IU] | Freq: Every day | SUBCUTANEOUS | Status: DC
Start: 2017-02-22 — End: 2017-02-28

## 2017-02-22 MED ORDER — MORPHINE SULFATE (PF) 2 MG/ML IV SOLN
4.0000 mg | Freq: Once | INTRAVENOUS | Status: AC
  Administered 2017-02-22: 4 mg via INTRAVENOUS
  Filled 2017-02-22: qty 2

## 2017-02-22 MED ORDER — HYDRALAZINE HCL 20 MG/ML IJ SOLN
5.0000 mg | Freq: Three times a day (TID) | INTRAMUSCULAR | Status: DC | PRN
  Administered 2017-02-22 – 2017-02-24 (×3): 5 mg via INTRAVENOUS
  Filled 2017-02-22 (×3): qty 1
  Filled 2017-02-22: qty 0.25

## 2017-02-22 MED ORDER — IOPAMIDOL (ISOVUE-300) INJECTION 61%
100.0000 mL | Freq: Once | INTRAVENOUS | Status: AC | PRN
  Administered 2017-02-22: 100 mL via INTRAVENOUS

## 2017-02-22 MED ORDER — LORAZEPAM 2 MG/ML IJ SOLN
0.5000 mg | Freq: Three times a day (TID) | INTRAMUSCULAR | Status: DC | PRN
  Administered 2017-02-26: 0.5 mg via INTRAVENOUS
  Filled 2017-02-22: qty 1

## 2017-02-22 MED ORDER — ACETAMINOPHEN 325 MG PO TABS
650.0000 mg | ORAL_TABLET | Freq: Four times a day (QID) | ORAL | Status: DC | PRN
  Administered 2017-02-23: 650 mg via ORAL
  Filled 2017-02-22 (×2): qty 2

## 2017-02-22 MED ORDER — ONDANSETRON HCL 4 MG/2ML IJ SOLN
4.0000 mg | Freq: Once | INTRAMUSCULAR | Status: AC
  Administered 2017-02-22: 4 mg via INTRAVENOUS
  Filled 2017-02-22: qty 2

## 2017-02-22 MED ORDER — SODIUM CHLORIDE 0.9 % IV BOLUS (SEPSIS)
1000.0000 mL | Freq: Once | INTRAVENOUS | Status: AC
  Administered 2017-02-22: 1000 mL via INTRAVENOUS

## 2017-02-22 MED ORDER — INSULIN GLARGINE 100 UNIT/ML ~~LOC~~ SOLN
30.0000 [IU] | Freq: Every day | SUBCUTANEOUS | Status: DC
  Administered 2017-02-22 – 2017-02-25 (×4): 30 [IU] via SUBCUTANEOUS
  Filled 2017-02-22 (×6): qty 0.3

## 2017-02-22 MED ORDER — ONDANSETRON HCL 4 MG/2ML IJ SOLN
4.0000 mg | Freq: Four times a day (QID) | INTRAMUSCULAR | Status: DC | PRN
  Administered 2017-02-23 – 2017-02-24 (×2): 4 mg via INTRAVENOUS
  Filled 2017-02-22 (×2): qty 2

## 2017-02-22 MED ORDER — SODIUM CHLORIDE 0.9 % IV SOLN
INTRAVENOUS | Status: DC
  Administered 2017-02-22 – 2017-02-24 (×4): via INTRAVENOUS

## 2017-02-22 MED ORDER — HYDRALAZINE HCL 20 MG/ML IJ SOLN
10.0000 mg | Freq: Once | INTRAMUSCULAR | Status: AC
  Administered 2017-02-22: 10 mg via INTRAVENOUS
  Filled 2017-02-22: qty 1

## 2017-02-22 MED ORDER — LABETALOL HCL 5 MG/ML IV SOLN
10.0000 mg | Freq: Four times a day (QID) | INTRAVENOUS | Status: DC | PRN
  Administered 2017-02-22 – 2017-02-23 (×3): 10 mg via INTRAVENOUS
  Filled 2017-02-22 (×5): qty 4

## 2017-02-22 MED ORDER — ACETAMINOPHEN 650 MG RE SUPP
650.0000 mg | Freq: Four times a day (QID) | RECTAL | Status: DC | PRN
Start: 2017-02-22 — End: 2017-02-28

## 2017-02-22 MED ORDER — AMLODIPINE BESYLATE 5 MG PO TABS
5.0000 mg | ORAL_TABLET | Freq: Every day | ORAL | Status: DC
  Administered 2017-02-22 – 2017-02-23 (×2): 5 mg via ORAL
  Filled 2017-02-22 (×2): qty 1

## 2017-02-22 MED ORDER — PREDNISOLONE ACETATE 1 % OP SUSP
1.0000 [drp] | Freq: Four times a day (QID) | OPHTHALMIC | Status: DC
  Administered 2017-02-22 – 2017-02-28 (×21): 1 [drp] via OPHTHALMIC
  Filled 2017-02-22: qty 5

## 2017-02-22 MED ORDER — MORPHINE SULFATE (PF) 2 MG/ML IV SOLN
2.0000 mg | Freq: Once | INTRAVENOUS | Status: AC
  Administered 2017-02-22: 2 mg via INTRAVENOUS
  Filled 2017-02-22: qty 1

## 2017-02-22 MED ORDER — SODIUM CHLORIDE 0.9% FLUSH
3.0000 mL | Freq: Two times a day (BID) | INTRAVENOUS | Status: DC
  Administered 2017-02-22 – 2017-02-28 (×5): 3 mL via INTRAVENOUS

## 2017-02-22 MED ORDER — ONDANSETRON HCL 4 MG PO TABS: 4.0000 mg | ORAL_TABLET | Freq: Four times a day (QID) | ORAL | Status: DC | PRN

## 2017-02-22 MED ORDER — METOPROLOL TARTRATE 5 MG/5ML IV SOLN
5.0000 mg | Freq: Three times a day (TID) | INTRAVENOUS | Status: DC
  Administered 2017-02-22: 5 mg via INTRAVENOUS
  Filled 2017-02-22: qty 5

## 2017-02-22 MED ORDER — MORPHINE SULFATE (PF) 2 MG/ML IV SOLN
2.0000 mg | INTRAVENOUS | Status: DC | PRN
  Administered 2017-02-22 – 2017-02-24 (×5): 2 mg via INTRAVENOUS
  Filled 2017-02-22 (×5): qty 1

## 2017-02-22 NOTE — Consult Note (Signed)
General Surgery [facility]  Reason for Consult: small bowel obstruction, abdominal pain  Referring Physician: Dr. Randell Patient ER; Dr. Grandville Silos, [facility]  [name] is an 83 y.o. female.  HPI: patient is an 83 yo WF admitted to the medical service with acute onset of abdominal pain, nausea, emesis, and radiographic findings consistent with small bowel obstruction.  Patient became ill very early on the day of admission.  She complains of left sided abdominal pain.  Family is at bedside and provides much of history as patient is extremely hard of hearing.  Previous abdominal surgery includes resection for colon cancer (2006), appendectomy, and hysterectomy.  All surgeries at [facility] per family.  Patient has had prior episodes of small bowel obstruction, the most recent 2 years ago and this resolved without operative intervention.  Patient admitted to step-down unit by medical service for treatment of uncontrolled hypertension.  Past Medical History:  Diagnosis Date  . Anxiety   . Arthritis   . Cancer [facility]) 2006   colon  . Cataract   . CHF (congestive heart failure) ([place]) 05/2016   preserved EF, grade 2 diastolic dysfunction  . Diabetes mellitus without complication ([place])   . Diverticulitis   . Erythroderma desquamativum   . Hypertension     Past Surgical History:  Procedure Laterality Date  . ABDOMINAL HYSTERECTOMY    . APPENDECTOMY    . COLON SURGERY      Family History  Problem Relation Age of Onset  . Stroke Mother   . Diabetes Father   . Cancer Sister   . Diabetes Sister   . Cancer Brother   . Cancer Brother   . Diabetes Sister   . Stroke Sister     Social History:  reports that she has never smoked. She has never used smokeless tobacco. She reports that she does not drink alcohol or use drugs.  Allergies:  Allergies  Allergen Reactions  . Ace Inhibitors Dermatitis  . Neosporin [Neomycin-Bacitracin Zn-Polymyx] Dermatitis  .  Diovan [Valsartan] Rash    Medications: I have reviewed the patient's current medications.  Results for orders placed or performed during the hospital encounter of 02/22/17 (from the past 48 hour(s))  Lipase, blood     Status: None   Collection Time: 02/22/17  2:00 PM  Result Value Ref Range   Lipase 22 11 - 51 U/L  Comprehensive metabolic panel     Status: Abnormal   Collection Time: 02/22/17  2:00 PM  Result Value Ref Range   Sodium 129 (L) 135 - 145 mmol/L   Potassium 4.1 3.5 - 5.1 mmol/L   Chloride 94 (L) 101 - 111 mmol/L   CO2 25 22 - 32 mmol/L   Glucose, Bld 201 (H) 65 - 99 mg/dL   BUN 31 (H) 6 - 20 mg/dL   Creatinine, Ser 0.89 0.44 - 1.00 mg/dL   Calcium 8.8 (L) 8.9 - 10.3 mg/dL   Total Protein 7.6 6.5 - 8.1 g/dL   Albumin 3.5 3.5 - 5.0 g/dL   AST 18 15 - 41 U/L   ALT 24 14 - 54 U/L   Alkaline Phosphatase 55 38 - 126 U/L   Total Bilirubin 1.2 0.3 - 1.2 mg/dL   GFR calc non Af Amer 58 (L) >60 mL/min   GFR calc Af Amer >60 >60 mL/min    Comment: (NOTE) The eGFR has been calculated using the CKD EPI equation. This calculation has not been validated in all  clinical situations. eGFR's persistently <60 mL/min signify possible Chronic Kidney Disease.    Anion gap 10 5 - 15  CBC     Status: Abnormal   Collection Time: 02/22/17  2:00 PM  Result Value Ref Range   WBC 21.2 (H) 4.0 - 10.5 K/uL   RBC 5.10 3.87 - 5.11 MIL/uL   Hemoglobin 15.3 (H) 12.0 - 15.0 g/dL   HCT 42.6 36.0 - 46.0 %   MCV 83.5 78.0 - 100.0 fL   MCH 30.0 26.0 - 34.0 pg   MCHC 35.9 30.0 - 36.0 g/dL   RDW 13.2 11.5 - 15.5 %   Platelets 264 150 - 400 K/uL  I-Stat Chem 8, ED     Status: Abnormal   Collection Time: 02/22/17  2:13 PM  Result Value Ref Range   Sodium 132 (L) 135 - 145 mmol/L   Potassium 4.2 3.5 - 5.1 mmol/L   Chloride 98 (L) 101 - 111 mmol/L   BUN 32 (H) 6 - 20 mg/dL   Creatinine, Ser 0.80 0.44 - 1.00 mg/dL   Glucose, Bld 198 (H) 65 - 99 mg/dL   Calcium, Ion 1.10 (L) 1.15 - 1.40 mmol/L    TCO2 23 0 - 100 mmol/L   Hemoglobin 15.3 (H) 12.0 - 15.0 g/dL   HCT 45.0 36.0 - 46.0 %  Lactic acid, plasma     Status: None   Collection Time: 02/22/17  5:53 PM  Result Value Ref Range   Lactic Acid, Venous 0.9 0.5 - 1.9 mmol/L    Dg Chest 2 View  Result Date: 02/22/2017 CLINICAL DATA:  83 year old female with history of lower abdominal pain since this morning. Leukocytosis. EXAM: CHEST  2 VIEW COMPARISON:  Chest x-ray 08/31/2016. FINDINGS: Lung volumes are normal. No consolidative airspace disease. No pleural effusions. No pneumothorax. No pulmonary nodule or mass noted. Pulmonary vasculature and the cardiomediastinal silhouette are within normal limits. IMPRESSION: No radiographic evidence of acute cardiopulmonary disease. Electronically Signed   By: Vinnie Langton M.D.   On: 02/22/2017 17:07   Ct Abdomen Pelvis W Contrast  Result Date: 02/22/2017 CLINICAL DATA:  Lower abdominal pain EXAM: CT ABDOMEN AND PELVIS WITH CONTRAST TECHNIQUE: Multidetector CT imaging of the abdomen and pelvis was performed using the standard protocol following bolus administration of intravenous contrast. CONTRAST:  156m ISOVUE-300 IOPAMIDOL (ISOVUE-300) INJECTION 61% COMPARISON:  06/18/2016 FINDINGS: Lower chest: No acute abnormality. Hepatobiliary: Fatty infiltration of the liver is noted. The gallbladder is within normal limits. Pancreas: Unremarkable. No pancreatic ductal dilatation or surrounding inflammatory changes. Spleen: Normal in size without focal abnormality. Adrenals/Urinary Tract: The adrenal glands are unremarkable. The kidneys demonstrate a normal enhancement pattern bilaterally. No renal calculi or obstructive changes are seen. The bladder is well distended. Stomach/Bowel: Scattered diverticular change of the colon is noted. No diverticulitis is seen. The appendix has been surgically removed. In the left mid abdomen, there are multiple dilated loops of jejunum identified. A transition zone is noted  in the midline anteriorly best seen on image number 78 of series to. This lies just beneath the anterior abdominal wall and likely represents a adhesions. Some fecalization of small bowel material is noted proximal to this. Vascular/Lymphatic: Aortic atherosclerosis. No enlarged abdominal or pelvic lymph nodes. Reproductive: Status post hysterectomy. No adnexal masses. Other: No abdominal wall hernia or abnormality. No abdominopelvic ascites. Musculoskeletal: Degenerative changes of lumbar spine are seen. No acute bony abnormality is noted. IMPRESSION: Multiple dilated loops of jejunum in the left mid abdomen with a  focal transition zone as described. This likely represents an area of adhesions. Diverticulosis without diverticulitis. Chronic changes without acute abnormality. Electronically Signed   By: Inez Catalina M.D.   On: 02/22/2017 15:25    Review of Systems  Constitutional: Negative for chills and fever.  HENT: Positive for hearing loss.   Eyes: Negative.   Respiratory: Negative.   Cardiovascular: Negative.   Gastrointestinal: Positive for abdominal pain, nausea and vomiting.  Genitourinary: Negative.   Musculoskeletal: Negative.   Skin: Negative.   Neurological: Negative.   Endo/Heme/Allergies: Negative.   Psychiatric/Behavioral: Negative.    Blood pressure (!) 194/73, pulse (!) 55, temperature 98.4 F (36.9 C), temperature source Oral, resp. rate 16, height _0  (1.626 m), weight 75.8 kg (167 lb), SpO2 96 %. Physical Exam  Constitutional: She is oriented to person, place, and time. She appears well-developed and well-nourished. No distress.  HENT:  Head: Normocephalic and atraumatic.  Right Ear: External ear normal.  Left Ear: External ear normal.  Mouth/Throat: No oropharyngeal exudate.  Eyes: Pupils are equal, round, and reactive to light. Conjunctivae are normal. No scleral icterus.  Neck: Normal range of motion. Neck supple. No tracheal deviation present. No thyromegaly  present.  Cardiovascular: Normal rate, regular rhythm and normal heart sounds.   No murmur heard. Respiratory: Effort normal and breath sounds normal. No respiratory distress. She has no wheezes.  GI: She exhibits distension. She exhibits no mass. There is tenderness (left mid abdomen). There is guarding. There is no rebound.  Occasional BS present; well healed lower midline incision; no evidence of incisional or inguinal herniae  Musculoskeletal: Normal range of motion. She exhibits no edema or deformity.  Neurological: She is alert and oriented to person, place, and time.  Skin: Skin is warm and dry. She is not diaphoretic.  Psychiatric: She has a normal mood and affect. Her behavior is normal.    Assessment/Plan: Small bowel obstruction likely secondary to adhesions Uncontrolled hypertension Diabetes mellitus Hearing loss  Agree with admission to medical service  NPO, IV hydration, normalization of lytes  Control of HTN  Control of DM  I have reviewed the CT scan and discussed with family at bedside.  Will check AXR in AM 8/13.  Will need NG tube if nausea and emesis persist (no emesis in several hours now).  Earnstine Regal, MD, [facility] Office: [place] 02/22/2017, 8:02 PM

## 2017-02-22 NOTE — H&P (Signed)
History and Physical    [name] LNL:[id number] DOB: May 18, 1931 DOA: 02/22/2017  PCP: [name], MD Patient coming from: Home  I have personally briefly reviewed patient's old medical records in [place]  Chief Complaint: Abdominal pain  HPI: [name] is a 83 y.o. female with medical history significant of hypertension, diabetes mellitus, history of diverticulitis, status post abdominal hysterectomy, appendectomy, colon surgery, history of colon cancer status post colon surgery 2006, prior history of small bowel obstruction approximately 2 years ago presented to the ED with a several hour history of severe left lower quadrant abdominal pain nonradiating in nature with associated nausea and several bouts of emesis., No dizziness. Patient does endorse some generalized weakness.  ED Course: Patient was seen in the ED CT abdomen and pelvis which was done was concerning for small bowel obstruction. Comprehensive metabolic profile and a sodium of 129 chloride of 94 glucose of 201 BUN of 31 otherwise was within normal limits. CBC had a white count of 21.2 Melinda 15.3 otherwise was within normal limits. Chest x-ray was not done. Urinalysis was not done. EKG not done. Patient noted to have systolic blood pressures in the 200s.  Review of Systems: As per HPI otherwise 10 point review of systems negative.   Past Medical History:  Diagnosis Date  . Anxiety   . Arthritis   . Cancer [facility]) 2006   colon  . Cataract   . CHF (congestive heart failure) ([place]) 05/2016   preserved EF, grade 2 diastolic dysfunction  . Diabetes mellitus without complication ([place])   . Diverticulitis   . Erythroderma desquamativum   . Hypertension     Past Surgical History:  Procedure Laterality Date  . ABDOMINAL HYSTERECTOMY    . APPENDECTOMY    . COLON SURGERY       reports that she has never smoked. She has never used smokeless tobacco. She reports that she does not drink alcohol or use  drugs.  Allergies  Allergen Reactions  . Ace Inhibitors Dermatitis  . Neosporin [Neomycin-Bacitracin Zn-Polymyx] Dermatitis  . Diovan [Valsartan] Rash    Family History  Problem Relation Age of Onset  . Stroke Mother   . Diabetes Father   . Cancer Sister   . Diabetes Sister   . Cancer Brother   . Cancer Brother   . Diabetes Sister   . Stroke Sister    Unacceptable: Noncontributory, unremarkable, or negative. Acceptable: Family history reviewed and not pertinent (If you reviewed it)  Prior to Admission medications   Medication Sig Start Date End Date Taking? Authorizing Provider  ALPRAZolam Duanne Moron) 0.5 MG tablet Take 0.5 mg by mouth 3 (three) times daily as needed for anxiety.    Yes [provider]  amLODipine (NORVASC) 5 MG tablet Take 5 mg by mouth daily.   Yes [provider]  BIOTIN PO Take 1 tablet by mouth daily.   Yes [provider]  cetirizine (ZYRTEC) 10 MG tablet Take 10 mg by mouth daily.   Yes [provider]  furosemide (LASIX) 40 MG tablet Take 1 tablet (40 mg total) by mouth daily. 05/19/16  Yes Isaac Bliss, Rayford Halsted, MD  insulin aspart (NOVOLOG) 100 UNIT/ML injection Inject into the skin 3 (three) times daily as needed for high blood sugar. Per sliding scale instructions from based on blood sugar levels   Yes [provider]  insulin glargine (LANTUS) 100 UNIT/ML injection Inject 65 Units into the skin at bedtime.  Yes [provider]  labetalol (NORMODYNE) 100 MG tablet Take 100 mg by mouth 2 (two) times daily.    Yes [provider]  Multiple Vitamin (MULTIVITAMIN) tablet Take 1 tablet by mouth daily.   Yes [provider]  multivitamin-lutein (OCUVITE-LUTEIN) CAPS capsule Take 1 capsule by mouth daily.   Yes [provider]  pravastatin (PRAVACHOL) 40 MG tablet Take 40 mg by mouth daily.   Yes [provider]  prednisoLONE acetate (PRED FORTE) 1 % ophthalmic  suspension Place 1 drop into both eyes 4 (four) times daily. 02/09/17  Yes [provider]  predniSONE (DELTASONE) 20 MG tablet Take 1 tablet (20 mg total) by mouth daily. 09/07/16  Yes Virgel Manifold, MD  triamcinolone cream (KENALOG) 0.1 % APPLY TWICE DAILY 01/13/12  Yes Dunn, Areta Haber, PA-C  albuterol (PROVENTIL) (2.5 MG/3ML) 0.083% nebulizer solution Take 2.5 mg by nebulization every 4 (four) hours as needed for wheezing or shortness of breath (use every 4 to 6 hrs as needed).    [provider]    Physical Exam: Vitals:   02/22/17 1250 02/22/17 1533  BP: (!) 183/93 (!) 205/76  Pulse: 69 63  Resp: 16 16  Temp: 98.1 F (36.7 C)   TempSrc: Oral   SpO2: 100% 97%  Weight: 75.8 kg (167 lb)   Height: 5\' 4"  (1.626 m)     Constitutional: NAD, calm, comfortable Vitals:   02/22/17 1250 02/22/17 1533  BP: (!) 183/93 (!) 205/76  Pulse: 69 63  Resp: 16 16  Temp: 98.1 F (36.7 C)   TempSrc: Oral   SpO2: 100% 97%  Weight: 75.8 kg (167 lb)   Height: 5\' 4"  (1.626 m)    Eyes: PERRLA, lids and conjunctivae normal ENMT: Mucous membranes are dry. Posterior pharynx clear of any exudate or lesions.Normal dentition.  Neck: normal, supple, no masses, no thyromegaly Respiratory: clear to auscultation bilaterally, no wheezing, no crackles. Normal respiratory effort. No accessory muscle use.  Cardiovascular: Regular rate and rhythm, no murmurs / rubs / gallops. No extremity edema. 2+ pedal pulses. No carotid bruits.  Abdomen: Soft, significant tenderness to palpation in the left upper and left lower quadrants, hypoactive bowel sounds. No hepatosplenomegaly.  Musculoskeletal: no clubbing / cyanosis. No joint deformity upper and lower extremities. Good ROM, no contractures. Normal muscle tone.  Skin: Right lower extremity with skin graft. Otherwise no rashes, lesions, ulcers. No induration Neurologic: CN 2-12 grossly intact. Sensation intact, DTR normal. Strength 5/5 in all 4.   Psychiatric: Normal judgment and insight. Alert and oriented x 3. Normal mood.   Labs on Admission: I have personally reviewed following labs and imaging studies  CBC:  Recent Labs Lab 02/22/17 1400 02/22/17 1413  WBC 21.2*  --   HGB 15.3* 15.3*  HCT 42.6 45.0  MCV 83.5  --   PLT 264  --    Basic Metabolic Panel:  Recent Labs Lab 02/22/17 1400 02/22/17 1413  NA 129* 132*  K 4.1 4.2  CL 94* 98*  CO2 25  --   GLUCOSE 201* 198*  BUN 31* 32*  CREATININE 0.89 0.80  CALCIUM 8.8*  --    GFR: Estimated Creatinine Clearance: 52.1 mL/min (by C-G formula based on SCr of 0.8 mg/dL). Liver Function Tests:  Recent Labs Lab 02/22/17 1400  AST 18  ALT 24  ALKPHOS 55  BILITOT 1.2  PROT 7.6  ALBUMIN 3.5    Recent Labs Lab 02/22/17 1400  LIPASE 22   No results  for input(s): AMMONIA in the last 168 hours. Coagulation Profile: No results for input(s): INR, PROTIME in the last 168 hours. Cardiac Enzymes: No results for input(s): CKTOTAL, CKMB, CKMBINDEX, TROPONINI in the last 168 hours. BNP (last 3 results) No results for input(s): PROBNP in the last 8760 hours. HbA1C: No results for input(s): HGBA1C in the last 72 hours. CBG: No results for input(s): GLUCAP in the last 168 hours. Lipid Profile: No results for input(s): CHOL, HDL, LDLCALC, TRIG, CHOLHDL, LDLDIRECT in the last 72 hours. Thyroid Function Tests: No results for input(s): TSH, T4TOTAL, FREET4, T3FREE, THYROIDAB in the last 72 hours. Anemia Panel: No results for input(s): VITAMINB12, FOLATE, FERRITIN, TIBC, IRON, RETICCTPCT in the last 72 hours. Urine analysis:    Component Value Date/Time   COLORURINE YELLOW 09/01/2016 0001   APPEARANCEUR CLEAR 09/01/2016 0001   LABSPEC 1.013 09/01/2016 0001   PHURINE 5.0 09/01/2016 0001   GLUCOSEU NEGATIVE 09/01/2016 0001   HGBUR NEGATIVE 09/01/2016 0001   BILIRUBINUR NEGATIVE 09/01/2016 0001   KETONESUR NEGATIVE 09/01/2016 0001   PROTEINUR NEGATIVE 09/01/2016  0001   UROBILINOGEN 0.2 08/16/2010 2011   NITRITE NEGATIVE 09/01/2016 0001   LEUKOCYTESUR NEGATIVE 09/01/2016 0001    Radiological Exams on Admission: Ct Abdomen Pelvis W Contrast  Result Date: 02/22/2017 CLINICAL DATA:  Lower abdominal pain EXAM: CT ABDOMEN AND PELVIS WITH CONTRAST TECHNIQUE: Multidetector CT imaging of the abdomen and pelvis was performed using the standard protocol following bolus administration of intravenous contrast. CONTRAST:  179mL ISOVUE-300 IOPAMIDOL (ISOVUE-300) INJECTION 61% COMPARISON:  06/18/2016 FINDINGS: Lower chest: No acute abnormality. Hepatobiliary: Fatty infiltration of the liver is noted. The gallbladder is within normal limits. Pancreas: Unremarkable. No pancreatic ductal dilatation or surrounding inflammatory changes. Spleen: Normal in size without focal abnormality. Adrenals/Urinary Tract: The adrenal glands are unremarkable. The kidneys demonstrate a normal enhancement pattern bilaterally. No renal calculi or obstructive changes are seen. The bladder is well distended. Stomach/Bowel: Scattered diverticular change of the colon is noted. No diverticulitis is seen. The appendix has been surgically removed. In the left mid abdomen, there are multiple dilated loops of jejunum identified. A transition zone is noted in the midline anteriorly best seen on image number 78 of series to. This lies just beneath the anterior abdominal wall and likely represents a adhesions. Some fecalization of small bowel material is noted proximal to this. Vascular/Lymphatic: Aortic atherosclerosis. No enlarged abdominal or pelvic lymph nodes. Reproductive: Status post hysterectomy. No adnexal masses. Other: No abdominal wall hernia or abnormality. No abdominopelvic ascites. Musculoskeletal: Degenerative changes of lumbar spine are seen. No acute bony abnormality is noted. IMPRESSION: Multiple dilated loops of jejunum in the left mid abdomen with a focal transition zone as described. This  likely represents an area of adhesions. Diverticulosis without diverticulitis. Chronic changes without acute abnormality. Electronically Signed   By: Inez Catalina M.D.   On: 02/22/2017 15:25    EKG: Independently reviewed. None  Assessment/Plan Principal Problem:   SBO (small bowel obstruction) (HCC) Active Problems:   Hypertensive urgency   Leukocytosis   Essential hypertension   Diabetes mellitus type 2 in obese (HCC)   Anxiety   Dizziness   Hyponatremia   Dehydration  (please populate well all problems here in Problem List. (For example, if patient is on BP meds at home and you resume or decide to hold them, it is a problem that needs to be her. Same for CAD, COPD, HLD and so on)  #1 small bowel obstruction This and presented with a  several hour history of significant left lower quadrant abdominal pain, nausea, several bouts of emesis. CBC with a leukocytosis. CT abdomen and pelvis with multiple dilated loops of jejunum in the left mid abdomen with a focal transition zone. Diverticulosis without diverticulitis. Chronic changes without acute abnormality. Will admit patient to the step down unit as patient is noted to be in hypertensive urgency. Keep nothing by mouth. IV fluids. Supportive care. The ED physician general surgery has been consulted who will see the patient in formal consultation. Follow.  #2 hypertensive urgency Likely secondary to pain from small bowel obstruction in the setting of inability to take medications today due to nausea vomiting and abdominal pain. Patient's systolic blood pressure noted to be in the 200s. Patient given a dose of IV hydralazine with systolic blood pressure now in the 180s. Place on Lopressor 5 mg IV every 8 hours. Hydralazine when necessary Follow.   #3 leukocytosis Likely reactive leukocytosis. Patient however stated had several bouts of nausea and emesis. Check a chest x-ray to rule out aspiration pneumonia. Check blood cultures 2. Check a UA  with cultures and sensitivities. Will hold off on antibiotics at this time until results are back.  #4 hyponatremia Likely secondary to dehydration. Improving with hydration.  #5 dehydration Secondary to GI losses. IV fluids.  #6 hyponatremia Likely secondary to volume depletion/GI losses. IV fluids. Follow.  #7 diabetes mellitus Check a hemoglobin A1c. Place on have those home Lantus. Sliding scale insulin.  #8 anxiety Ativan as needed.     DVT prophylaxis: SCDs Code Status: DO NOT RESUSCITATE Family Communication: Updated patient and grandchildren at bedside. Disposition Plan: Home when SBO resolved. Consults called: General Suregry: Dr Harlow Asa per ED doc. Admission status: Admit to SDU   [facility] MD [facility] Pager [phone]  If 7PM-7AM, please contact night-coverage [email or web address] Password [facility]  02/22/2017, 5:02 PM

## 2017-02-22 NOTE — ED Notes (Signed)
Hospitalist at bedside 

## 2017-02-22 NOTE — ED Provider Notes (Signed)
[place] DEPT Provider Note   CSN: [id number] Arrival date & time: 02/22/17  1136     History   Chief Complaint No chief complaint on file.   HPI [name] is a 83 y.o. female.  83 yo F with a chief complaint of left lower quadrant abdominal pain. Going on for the past 11 hours or so. Having some nausea and vomiting with this as well. Denies fevers or chills. Discussed the pain is sharp and shooting. Denies radiation. Denies dysuria or increased frequency or hesitancy.   The history is provided by the patient.  Abdominal Pain   This is a new problem. The current episode started 2 days ago. The problem occurs constantly. The problem has not changed since onset.The pain is located in the LLQ. The quality of the pain is sharp and shooting. The pain is at a severity of 10/10. The pain is severe. Associated symptoms include nausea and vomiting. Pertinent negatives include fever, dysuria, headaches, arthralgias and myalgias. Nothing aggravates the symptoms. Nothing relieves the symptoms.    Past Medical History:  Diagnosis Date  . Anxiety   . Arthritis   . Cancer [facility]) 2006   colon  . Cataract   . CHF (congestive heart failure) ([place]) 05/2016   preserved EF, grade 2 diastolic dysfunction  . Diabetes mellitus without complication ([place])   . Diverticulitis   . Erythroderma desquamativum   . Hypertension     Patient Active Problem List   Diagnosis Date Noted  . SBO (small bowel obstruction) ([place]) 02/22/2017  . Hypertensive urgency 02/22/2017  . Leukocytosis 02/22/2017  . Hyponatremia 02/22/2017  . Dehydration 02/22/2017  . Dizziness 09/01/2016  . Weakness 09/01/2016  . Psoriasis vulgaris 09/01/2016  . Right hip pain 09/01/2016  . Diabetic foot ulcer ([place]) 09/01/2016  . Dyspnea 05/15/2016  . Congestive heart failure (CHF) ([place]) 05/15/2016  . Rash and nonspecific skin eruption 05/15/2016  . Essential hypertension 05/15/2016  . Diabetes mellitus type 2 in obese  ([place]) 05/15/2016  . Anxiety 05/15/2016  . Acute respiratory failure with hypoxia ([place]) 05/15/2016    Past Surgical History:  Procedure Laterality Date  . ABDOMINAL HYSTERECTOMY    . APPENDECTOMY    . COLON SURGERY      OB History    No data available       Home Medications    Prior to Admission medications   Medication Sig Start Date End Date Taking? Authorizing Provider  ALPRAZolam Duanne Moron) 0.5 MG tablet Take 0.5 mg by mouth 3 (three) times daily as needed for anxiety.    Yes [provider]  amLODipine (NORVASC) 5 MG tablet Take 5 mg by mouth daily.   Yes [provider]  BIOTIN PO Take 1 tablet by mouth daily.   Yes [provider]  cetirizine (ZYRTEC) 10 MG tablet Take 10 mg by mouth daily.   Yes [provider]  furosemide (LASIX) 40 MG tablet Take 1 tablet (40 mg total) by mouth daily. 05/19/16  Yes Isaac Bliss, Rayford Halsted, MD  insulin aspart (NOVOLOG) 100 UNIT/ML injection Inject into the skin 3 (three) times daily as needed for high blood sugar. Per sliding scale instructions from based on blood sugar levels   Yes [provider]  insulin glargine (LANTUS) 100 UNIT/ML injection Inject 65 Units into the skin at bedtime.    Yes [provider]  labetalol (NORMODYNE) 100 MG tablet Take 100 mg by mouth 2 (two) times daily.    Yes [provider]  Multiple Vitamin (MULTIVITAMIN) tablet Take 1 tablet by mouth daily.   Yes [provider]  multivitamin-lutein (OCUVITE-LUTEIN) CAPS capsule Take 1 capsule by mouth daily.   Yes [provider]  pravastatin (PRAVACHOL) 40 MG tablet Take 40 mg by mouth daily.   Yes [provider]  prednisoLONE acetate (PRED FORTE) 1 % ophthalmic suspension Place 1 drop into both eyes 4 (four) times daily. 02/09/17  Yes [provider]  predniSONE (DELTASONE) 20 MG tablet Take 1 tablet (20 mg total) by mouth daily. 09/07/16  Yes Virgel Manifold, MD    triamcinolone cream (KENALOG) 0.1 % APPLY TWICE DAILY 01/13/12  Yes Dunn, Areta Haber, PA-C  albuterol (PROVENTIL) (2.5 MG/3ML) 0.083% nebulizer solution Take 2.5 mg by nebulization every 4 (four) hours as needed for wheezing or shortness of breath (use every 4 to 6 hrs as needed).    [provider]    Family History Family History  Problem Relation Age of Onset  . Stroke Mother   . Diabetes Father   . Cancer Sister   . Diabetes Sister   . Cancer Brother   . Cancer Brother   . Diabetes Sister   . Stroke Sister     Social History Social History  Substance Use Topics  . Smoking status: Never Smoker  . Smokeless tobacco: Never Used  . Alcohol use No     Allergies   Ace inhibitors; Neosporin [neomycin-bacitracin zn-polymyx]; and Diovan [valsartan]   Review of Systems Review of Systems  Constitutional: Negative for chills and fever.  HENT: Negative for congestion and rhinorrhea.   Eyes: Negative for redness and visual disturbance.  Respiratory: Negative for shortness of breath and wheezing.   Cardiovascular: Negative for chest pain and palpitations.  Gastrointestinal: Positive for abdominal pain, nausea and vomiting.  Genitourinary: Negative for dysuria and urgency.  Musculoskeletal: Negative for arthralgias and myalgias.  Skin: Negative for pallor and wound.  Neurological: Negative for dizziness and headaches.     Physical Exam Updated Vital Signs BP (!) 205/76   Pulse 63   Temp 98.1 F (36.7 C) (Oral)   Resp 16   Ht 5\' 4"  (1.626 m)   Wt 75.8 kg (167 lb)   SpO2 97%   BMI 28.67 kg/m   Physical Exam  Constitutional: She is oriented to person, place, and time. She appears well-developed and well-nourished. No distress.  HENT:  Head: Normocephalic and atraumatic.  Eyes: Pupils are equal, round, and reactive to light. EOM are normal.  Neck: Normal range of motion. Neck supple.  Cardiovascular: Normal rate and regular rhythm.  Exam reveals no gallop and no  friction rub.   No murmur heard. Pulmonary/Chest: Effort normal. She has no wheezes. She has no rales.  Abdominal: Soft. She exhibits distension (tympanitic to percussion). She exhibits no mass. There is tenderness (Pain diffusely about the abdomen, worst to the LLQ). There is no guarding.  Musculoskeletal: She exhibits no edema or tenderness.  Neurological: She is alert and oriented to person, place, and time.  Skin: Skin is warm and dry. She is not diaphoretic.  Psychiatric: She has a normal mood and affect. Her behavior is normal.  Nursing note and vitals reviewed.    ED Treatments / Results  Labs (all labs ordered are listed, but only abnormal results are displayed) Labs Reviewed  COMPREHENSIVE METABOLIC PANEL - Abnormal; Notable for the following:       Result Value   Sodium 129 (*)    Chloride  94 (*)    Glucose, Bld 201 (*)    BUN 31 (*)    Calcium 8.8 (*)    GFR calc non Af Amer 58 (*)    All other components within normal limits  CBC - Abnormal; Notable for the following:    WBC 21.2 (*)    Hemoglobin 15.3 (*)    All other components within normal limits  I-STAT CHEM 8, ED - Abnormal; Notable for the following:    Sodium 132 (*)    Chloride 98 (*)    BUN 32 (*)    Glucose, Bld 198 (*)    Calcium, Ion 1.10 (*)    Hemoglobin 15.3 (*)    All other components within normal limits  URINE CULTURE  CULTURE, BLOOD (ROUTINE X 2)  CULTURE, BLOOD (ROUTINE X 2)  LIPASE, BLOOD  URINALYSIS, ROUTINE W REFLEX MICROSCOPIC    EKG  EKG Interpretation None       Radiology Ct Abdomen Pelvis W Contrast  Result Date: 02/22/2017 CLINICAL DATA:  Lower abdominal pain EXAM: CT ABDOMEN AND PELVIS WITH CONTRAST TECHNIQUE: Multidetector CT imaging of the abdomen and pelvis was performed using the standard protocol following bolus administration of intravenous contrast. CONTRAST:  165mL ISOVUE-300 IOPAMIDOL (ISOVUE-300) INJECTION 61% COMPARISON:  06/18/2016 FINDINGS: Lower chest: No  acute abnormality. Hepatobiliary: Fatty infiltration of the liver is noted. The gallbladder is within normal limits. Pancreas: Unremarkable. No pancreatic ductal dilatation or surrounding inflammatory changes. Spleen: Normal in size without focal abnormality. Adrenals/Urinary Tract: The adrenal glands are unremarkable. The kidneys demonstrate a normal enhancement pattern bilaterally. No renal calculi or obstructive changes are seen. The bladder is well distended. Stomach/Bowel: Scattered diverticular change of the colon is noted. No diverticulitis is seen. The appendix has been surgically removed. In the left mid abdomen, there are multiple dilated loops of jejunum identified. A transition zone is noted in the midline anteriorly best seen on image number 78 of series to. This lies just beneath the anterior abdominal wall and likely represents a adhesions. Some fecalization of small bowel material is noted proximal to this. Vascular/Lymphatic: Aortic atherosclerosis. No enlarged abdominal or pelvic lymph nodes. Reproductive: Status post hysterectomy. No adnexal masses. Other: No abdominal wall hernia or abnormality. No abdominopelvic ascites. Musculoskeletal: Degenerative changes of lumbar spine are seen. No acute bony abnormality is noted. IMPRESSION: Multiple dilated loops of jejunum in the left mid abdomen with a focal transition zone as described. This likely represents an area of adhesions. Diverticulosis without diverticulitis. Chronic changes without acute abnormality. Electronically Signed   By: Inez Catalina M.D.   On: 02/22/2017 15:25    Procedures Procedures (including critical care time)  Medications Ordered in ED Medications  metoprolol tartrate (LOPRESSOR) injection 5 mg (not administered)  morphine 2 MG/ML injection 4 mg (4 mg Intravenous Given 02/22/17 1415)  ondansetron (ZOFRAN) injection 4 mg (4 mg Intravenous Given 02/22/17 1415)  sodium chloride 0.9 % bolus 1,000 mL (0 mLs Intravenous  Stopped 02/22/17 1612)  iopamidol (ISOVUE-300) 61 % injection 100 mL (100 mLs Intravenous Contrast Given 02/22/17 1439)  hydrALAZINE (APRESOLINE) injection 10 mg (10 mg Intravenous Given 02/22/17 1612)  morphine 2 MG/ML injection 2 mg (2 mg Intravenous Given 02/22/17 1612)  ondansetron (ZOFRAN) injection 4 mg (4 mg Intravenous Given 02/22/17 1612)     Initial Impression / Assessment and Plan / ED Course  I have reviewed the triage vital signs and the nursing notes.  Pertinent labs & imaging results that were available during my  care of the patient were reviewed by me and considered in my medical decision making (see chart for details).     83 yo F with a cc of LLQ pain, vomiting.  Concern for SBO vs diverticulitis, ct ordered.   CT with SBO.  Discussed with Dr. Harlow Asa, Gen surgery.  Admit to hospitalist.   The patients results and plan were reviewed and discussed.   Any x-rays performed were independently reviewed by myself.   Differential diagnosis were considered with the presenting HPI.  Medications  metoprolol tartrate (LOPRESSOR) injection 5 mg (not administered)  morphine 2 MG/ML injection 4 mg (4 mg Intravenous Given 02/22/17 1415)  ondansetron (ZOFRAN) injection 4 mg (4 mg Intravenous Given 02/22/17 1415)  sodium chloride 0.9 % bolus 1,000 mL (0 mLs Intravenous Stopped 02/22/17 1612)  iopamidol (ISOVUE-300) 61 % injection 100 mL (100 mLs Intravenous Contrast Given 02/22/17 1439)  hydrALAZINE (APRESOLINE) injection 10 mg (10 mg Intravenous Given 02/22/17 1612)  morphine 2 MG/ML injection 2 mg (2 mg Intravenous Given 02/22/17 1612)  ondansetron (ZOFRAN) injection 4 mg (4 mg Intravenous Given 02/22/17 1612)    Vitals:   02/22/17 1250 02/22/17 1533  BP: (!) 183/93 (!) 205/76  Pulse: 69 63  Resp: 16 16  Temp: 98.1 F (36.7 C)   TempSrc: Oral   SpO2: 100% 97%  Weight: 75.8 kg (167 lb)   Height: 5\' 4"  (1.626 m)     Final diagnoses:  SBO (small bowel obstruction) (HCC)    Essential hypertension    Admission/ observation were discussed with the admitting physician, patient and/or family and they are comfortable with the plan.    Final Clinical Impressions(s) / ED Diagnoses   Final diagnoses:  SBO (small bowel obstruction) ([place])  Essential hypertension    New Prescriptions New Prescriptions   No medications on file     Deno Etienne, DO 02/22/17 1651

## 2017-02-22 NOTE — ED Triage Notes (Signed)
Patient presents with c/o lower  abdominal pain staring this morning. Patient rating her pain at 7/10.

## 2017-02-23 ENCOUNTER — Inpatient Hospital Stay (HOSPITAL_COMMUNITY): Payer: Medicare Other

## 2017-02-23 DIAGNOSIS — R338 Other retention of urine: Secondary | ICD-10-CM | POA: Diagnosis present

## 2017-02-23 DIAGNOSIS — I5032 Chronic diastolic (congestive) heart failure: Secondary | ICD-10-CM

## 2017-02-23 LAB — CBC
HCT: 41 % (ref 36.0–46.0)
Hemoglobin: 14.6 g/dL (ref 12.0–15.0)
MCH: 30.4 pg (ref 26.0–34.0)
MCHC: 35.6 g/dL (ref 30.0–36.0)
MCV: 85.4 fL (ref 78.0–100.0)
PLATELETS: 174 10*3/uL (ref 150–400)
RBC: 4.8 MIL/uL (ref 3.87–5.11)
RDW: 13.7 % (ref 11.5–15.5)
WBC: 15.7 10*3/uL — ABNORMAL HIGH (ref 4.0–10.5)

## 2017-02-23 LAB — GLUCOSE, CAPILLARY
GLUCOSE-CAPILLARY: 112 mg/dL — AB (ref 65–99)
GLUCOSE-CAPILLARY: 93 mg/dL (ref 65–99)
GLUCOSE-CAPILLARY: 101 mg/dL — AB (ref 65–99)
GLUCOSE-CAPILLARY: 79 mg/dL (ref 65–99)
Glucose-Capillary: 161 mg/dL — ABNORMAL HIGH (ref 65–99)
Glucose-Capillary: 134 mg/dL — ABNORMAL HIGH (ref 65–99)
Glucose-Capillary: 101 mg/dL — ABNORMAL HIGH (ref 65–99)

## 2017-02-23 LAB — PROTIME-INR
INR: 1.03
PROTHROMBIN TIME: 13.5 s (ref 11.4–15.2)

## 2017-02-23 LAB — COMPREHENSIVE METABOLIC PANEL
ALK PHOS: 47 U/L (ref 38–126)
ALT: 18 U/L (ref 14–54)
AST: 24 U/L (ref 15–41)
Albumin: 3 g/dL — ABNORMAL LOW (ref 3.5–5.0)
Anion gap: 12 (ref 5–15)
BUN: 28 mg/dL — ABNORMAL HIGH (ref 6–20)
CALCIUM: 8.3 mg/dL — AB (ref 8.9–10.3)
CO2: 19 mmol/L — AB (ref 22–32)
CREATININE: 0.9 mg/dL (ref 0.44–1.00)
Chloride: 105 mmol/L (ref 101–111)
GFR, EST NON AFRICAN AMERICAN: 57 mL/min — AB (ref >60)
Glucose, Bld: 133 mg/dL — ABNORMAL HIGH (ref 65–99)
Potassium: 4.7 mmol/L (ref 3.5–5.1)
Sodium: 136 mmol/L (ref 135–145)
Total Bilirubin: 1 mg/dL (ref 0.3–1.2)
Total Protein: 6.4 g/dL — ABNORMAL LOW (ref 6.5–8.1)

## 2017-02-23 LAB — URINALYSIS, ROUTINE W REFLEX MICROSCOPIC
BILIRUBIN URINE: NEGATIVE
Bacteria, UA: NONE SEEN
GLUCOSE, UA: 50 mg/dL — AB
HGB URINE DIPSTICK: NEGATIVE
KETONES UR: 5 mg/dL — AB
NITRITE: NEGATIVE
PH: 5 (ref 5.0–8.0)
PROTEIN: 30 mg/dL — AB
SQUAMOUS EPITHELIAL / LPF: NONE SEEN
Specific Gravity, Urine: 1.026 (ref 1.005–1.030)

## 2017-02-23 LAB — MAGNESIUM: Magnesium: 2.1 mg/dL (ref 1.7–2.4)

## 2017-02-23 MED ORDER — TAMSULOSIN HCL 0.4 MG PO CAPS
0.4000 mg | ORAL_CAPSULE | Freq: Every day | ORAL | Status: DC
  Administered 2017-02-23 – 2017-02-27 (×5): 0.4 mg via ORAL
  Filled 2017-02-23 (×5): qty 1

## 2017-02-23 MED ORDER — AMLODIPINE BESYLATE 10 MG PO TABS
10.0000 mg | ORAL_TABLET | Freq: Every day | ORAL | Status: DC
  Administered 2017-02-24 – 2017-02-28 (×5): 10 mg via ORAL
  Filled 2017-02-23 (×5): qty 1

## 2017-02-23 MED ORDER — LABETALOL HCL 100 MG PO TABS
100.0000 mg | ORAL_TABLET | Freq: Two times a day (BID) | ORAL | Status: DC
  Administered 2017-02-23 – 2017-02-28 (×11): 100 mg via ORAL
  Filled 2017-02-23 (×11): qty 1

## 2017-02-23 MED ORDER — AMLODIPINE BESYLATE 5 MG PO TABS
5.0000 mg | ORAL_TABLET | Freq: Once | ORAL | Status: AC
  Administered 2017-02-23: 5 mg via ORAL
  Filled 2017-02-23: qty 1

## 2017-02-23 NOTE — Progress Notes (Signed)
Patient ID: [name], female   DOB: 1930/12/24, 83 y.o.   MRN: [id number]     Subjective: Feels much better today. Pain significantly improved. Did require some medication earlier this morning. Denies any flatus or bowel movements since admission.  Objective: Vital signs in last 24 hours: Temp:  [97.7 F (36.5 C)-98.7 F (37.1 C)] 98.7 F (37.1 C) (08/13 0558) Pulse Rate:  [53-69] 57 (08/13 0800) Resp:  [13-24] 24 (08/13 0800) BP: (131-225)/(46-93) 188/55 (08/13 0800) SpO2:  [93 %-100 %] 94 % (08/13 0800) Weight:  [75.8 kg (167 lb)-77.8 kg (171 lb 8.3 oz)] 77.8 kg (171 lb 8.3 oz) (08/13 0500)    Intake/Output from previous day: 08/12 0701 - 08/13 0700 In: 2126.7 [I.V.:1126.7; IV Piggyback:1000] Out: -  Intake/Output this shift: No intake/output data recorded.  General appearance: alert, cooperative and no distress GI: Moderately distended. Moderate tenderness in the left upper quadrant. No guarding or peritoneal signs. No palpable mass. Skin: Skin color, texture, turgor normal. No rashes or lesions or Multiple small abrasions were scabs and faint erythema over the thighs and lower abdomen  Lab Results:   Recent Labs  02/22/17 1400 02/22/17 1413 02/23/17 0337  WBC 21.2*  --  15.7*  HGB 15.3* 15.3* 14.6  HCT 42.6 45.0 41.0  PLT 264  --  174   BMET  Recent Labs  02/22/17 1400 02/22/17 1413 02/23/17 0337  NA 129* 132* 136  K 4.1 4.2 4.7  CL 94* 98* 105  CO2 25  --  19*  GLUCOSE 201* 198* 133*  BUN 31* 32* 28*  CREATININE 0.89 0.80 0.90  CALCIUM 8.8*  --  8.3*     Studies/Results: Dg Chest 2 View  Result Date: 02/22/2017 CLINICAL DATA:  83 year old female with history of lower abdominal pain since this morning. Leukocytosis. EXAM: CHEST  2 VIEW COMPARISON:  Chest x-ray 08/31/2016. FINDINGS: Lung volumes are normal. No consolidative airspace disease. No pleural effusions. No pneumothorax. No pulmonary nodule or mass noted. Pulmonary vasculature and the  cardiomediastinal silhouette are within normal limits. IMPRESSION: No radiographic evidence of acute cardiopulmonary disease. Electronically Signed   By: Vinnie Langton M.D.   On: 02/22/2017 17:07   Abd 1 View (kub)  Result Date: 02/23/2017 CLINICAL DATA:  Small bowel obstruction. EXAM: ABDOMEN - 1 VIEW COMPARISON:  CT of the abdomen 02/22/2017 FINDINGS: Gaseous distention of large and small bowel is decreasing. There is gas throughout colon. No obstruction is present. Contrast is present within a mildly distended urinary bladder. IMPRESSION: 1. Decreasing distention of large and small bowel without significant obstruction. 2. Mild distention of the urinary bladder. Electronically Signed   By: San Morelle M.D.   On: 02/23/2017 07:08   Ct Abdomen Pelvis W Contrast  Result Date: 02/22/2017 CLINICAL DATA:  Lower abdominal pain EXAM: CT ABDOMEN AND PELVIS WITH CONTRAST TECHNIQUE: Multidetector CT imaging of the abdomen and pelvis was performed using the standard protocol following bolus administration of intravenous contrast. CONTRAST:  144mL ISOVUE-300 IOPAMIDOL (ISOVUE-300) INJECTION 61% COMPARISON:  06/18/2016 FINDINGS: Lower chest: No acute abnormality. Hepatobiliary: Fatty infiltration of the liver is noted. The gallbladder is within normal limits. Pancreas: Unremarkable. No pancreatic ductal dilatation or surrounding inflammatory changes. Spleen: Normal in size without focal abnormality. Adrenals/Urinary Tract: The adrenal glands are unremarkable. The kidneys demonstrate a normal enhancement pattern bilaterally. No renal calculi or obstructive changes are seen. The bladder is well distended. Stomach/Bowel: Scattered diverticular change of the colon is noted. No diverticulitis is seen. The  appendix has been surgically removed. In the left mid abdomen, there are multiple dilated loops of jejunum identified. A transition zone is noted in the midline anteriorly best seen on image number 78 of series  to. This lies just beneath the anterior abdominal wall and likely represents a adhesions. Some fecalization of small bowel material is noted proximal to this. Vascular/Lymphatic: Aortic atherosclerosis. No enlarged abdominal or pelvic lymph nodes. Reproductive: Status post hysterectomy. No adnexal masses. Other: No abdominal wall hernia or abnormality. No abdominopelvic ascites. Musculoskeletal: Degenerative changes of lumbar spine are seen. No acute bony abnormality is noted. IMPRESSION: Multiple dilated loops of jejunum in the left mid abdomen with a focal transition zone as described. This likely represents an area of adhesions. Diverticulosis without diverticulitis. Chronic changes without acute abnormality. Electronically Signed   By: Inez Catalina M.D.   On: 02/22/2017 15:25    Anti-infectives: Anti-infectives    None      Assessment/Plan: 83 year old female with multiple previous abdominal operations and history of small bowel obstructions. Admitted with apparent adhesive small bowel obstruction by CT scan. There appears to be significant improvement today with less pain and essentially resolution of her obstruction on plain abdominal x-rays. Leukocytosis improved but not resolved. However she does not yet have any bowel function and remains moderately tender in the left upper quadrant. Would continue bowel rest today. Out of bed as tolerated.     LOS: 1 day    [name] 02/23/2017

## 2017-02-23 NOTE — Evaluation (Signed)
Physical Therapy Evaluation Patient Details Name: [name] MRN: [id number] DOB: 1930/12/06 Today's Date: 02/23/2017   History of Present Illness  83 y.o. female with medical history significant of hypertension, diabetes mellitus, history of diverticulitis, status post abdominal hysterectomy, appendectomy, colon surgery, history of colon cancer status post colon surgery 2006, prior history of small bowel obstruction approximately 2 years ago presented to the ED with a several hour history of severe left lower quadrant abdominal pain nonradiating in nature with associated nausea and several bouts of emesis. Dx of SBO  Clinical Impression  Pt admitted with above diagnosis. Pt currently with functional limitations due to the deficits listed below (see PT Problem List). Pt ambulated 109' with RW, distance limited by fatigue. Good progress expected.  Pt will benefit from skilled PT to increase their independence and safety with mobility to allow discharge to the venue listed below.       Follow Up Recommendations Home health PT    Equipment Recommendations  None recommended by PT    Recommendations for Other Services       Precautions / Restrictions Precautions Precautions: Fall Precaution Comments: fell outside a few days PTA  Restrictions Weight Bearing Restrictions: No      Mobility  Bed Mobility Overal bed mobility: Needs Assistance Bed Mobility: Rolling;Sidelying to Sit Rolling: Min assist Sidelying to sit: Min assist       General bed mobility comments: VCs for technique  Transfers Overall transfer level: Needs assistance Equipment used: Rolling walker (2 wheeled) Transfers: Sit to/from Stand Sit to Stand: Mod assist         General transfer comment: VCs hand placement, mod A to rise  Ambulation/Gait Ambulation/Gait assistance: Min guard Ambulation Distance (Feet): 50 Feet Assistive device: Rolling walker (2 wheeled)       General Gait Details: steady with  RW, no LOB, vitals stable, distance limited by fatigue  Stairs            Wheelchair Mobility    Modified Rankin (Stroke Patients Only)       Balance Overall balance assessment: Needs assistance   Sitting balance-Leahy Scale: Good       Standing balance-Leahy Scale: Fair                               Pertinent Vitals/Pain Pain Assessment: No/denies pain    Home Living Family/patient expects to be discharged to:: Private residence Living Arrangements: Alone Available Help at Discharge: Available PRN/intermittently;Family (family comes daily) Type of Home: House Home Access: Stairs to enter Entrance Stairs-Rails: Right Entrance Stairs-Number of Steps: 4 Home Layout: One level Home Equipment: Cane - single point;Walker - 2 wheels;Shower seat;Grab bars - tub/shower      Prior Function Level of Independence: Independent with assistive device(s)         Comments: walks with SPC; independent bathing/dressing     Hand Dominance        Extremity/Trunk Assessment   Upper Extremity Assessment Upper Extremity Assessment: Overall WFL for tasks assessed    Lower Extremity Assessment Lower Extremity Assessment: Overall WFL for tasks assessed    Cervical / Trunk Assessment Cervical / Trunk Assessment: Normal  Communication   Communication: HOH  Cognition Arousal/Alertness: Awake/alert Behavior During Therapy: WFL for tasks assessed/performed Overall Cognitive Status: Within Functional Limits for tasks assessed  General Comments      Exercises     Assessment/Plan    PT Assessment Patient needs continued PT services  PT Problem List Decreased activity tolerance;Decreased balance;Decreased mobility       PT Treatment Interventions DME instruction;Gait training;Functional mobility training;Balance training;Therapeutic exercise;Patient/family education;Therapeutic activities    PT  Goals (Current goals can be found in the Care Plan section)  Acute Rehab PT Goals Patient Stated Goal: to go shopping and eat out PT Goal Formulation: With patient/family Time For Goal Achievement: 03/09/17 Potential to Achieve Goals: Good    Frequency Min 3X/week   Barriers to discharge        Co-evaluation               AM-PAC PT "6 Clicks" Daily Activity  Outcome Measure Difficulty turning over in bed (including adjusting bedclothes, sheets and blankets)?: A Lot Difficulty moving from lying on back to sitting on the side of the bed? : Total Difficulty sitting down on and standing up from a chair with arms (e.g., wheelchair, bedside commode, etc,.)?: Total Help needed moving to and from a bed to chair (including a wheelchair)?: A Lot Help needed walking in hospital room?: A Little Help needed climbing 3-5 steps with a railing? : A Lot 6 Click Score: 11    End of Session Equipment Utilized During Treatment: Gait belt Activity Tolerance: Patient tolerated treatment well Patient left: in chair;with call bell/phone within reach;with family/visitor present Nurse Communication: Mobility status PT Visit Diagnosis: History of falling (Z91.81);Difficulty in walking, not elsewhere classified (R26.2)    Time: 2947-6546 PT Time Calculation (min) (ACUTE ONLY): 25 min   Charges:   PT Evaluation $PT Eval Low Complexity: 1 Low PT Treatments $Gait Training: 8-22 mins   PT G Codes:          Philomena Doheny 02/23/2017, 1:36 PM [phone]

## 2017-02-23 NOTE — Progress Notes (Signed)
PROGRESS NOTE    [name]  PNT:[id number] DOB: 09-09-1930 DOA: 02/22/2017 PCP: [name], MD    Brief Narrative: Patient is 83 year old female history of hypertension, diabetes, history of diverticulitis, status post abdominal hysterectomy, appendectomy, colon surgery with a history of colon cancer status post colon surgery 2006 presenting to the ED with nausea vomiting abdominal pain found to have a small bowel obstruction and hypertensive urgency.   Assessment & Plan:   Principal Problem:   SBO (small bowel obstruction) (HCC) Active Problems:   Hypertensive urgency   Leukocytosis   Essential hypertension   Diabetes mellitus type 2 in obese (HCC)   Anxiety   Dizziness   Hyponatremia   Dehydration  #1 small bowel obstruction Likely secondary to adhesions. Patient presented with nausea vomiting significant abdominal pain. Patient states abdominal pain improving. No flatus. No bowel movement. No further nausea or emesis. Leukocytosis trending down. Continue bowel rest, IV fluids, supportive care. Patient has been seen in consultation by general surgery who were following.  #2 hypertensive urgency May have a component of pain associated with it. Patient currently on Norvasc 5 mg daily. Resume home regimen labetalol. Monitor heart rate. Follow.  #3 dehydration IV fluids.  #4 diabetes mellitus Hemoglobin A1c pending. CBGs have ranged from 112-192. Continue current dose of Lantus. Sliding scale insulin.  #5 leukocytosis Questionable etiology. Likely reactive secondary to problem #1. Chest x-ray negative. Blood cultures pending with no growth to date. Urinalysis pending. WBC trending down. No need for antibiotics at this time.  #6 anxiety Ativan as needed.  #7 hyponatremia Likely secondary to dehydration. Improved with hydration.   #8 history of chronic diastolic CHF Diuretics on hold. Monitor volume closely. Decrease IV fluids to 75 mL per hour.  DVT prophylaxis:  SCDs Code Status: DO NOT RESUSCITATE Family Communication: Updated patient. No family at bedside. Disposition Plan: Remain the step down unit for better blood pressure control. Likely home once acute medical issues have resolved.   Consultants:   Gen. surgery: Dr. Harlow Asa 02/22/2017  Procedures:  CT abdomen and pelvis 02/22/2017  Chest x-ray 12 2018  Abdominal films 02/23/2017  Antimicrobials:   None    Subjective: Patient states abdominal pain improving. Patient denies any nausea or emesis. No chest pain. No shortness of breath. Patient denies any flatus. No bowel movement.  Objective: Vitals:   02/23/17 0600 02/23/17 0648 02/23/17 0700 02/23/17 0800  BP: (!) 188/52 (!) 157/55 (!) 167/53 (!) 188/55  Pulse: (!) 58 (!) 57 (!) 56 (!) 57  Resp: 14 16 15  (!) 24  Temp:    98.2 F (36.8 C)  TempSrc:    Oral  SpO2: 94% 93% 95% 94%  Weight:      Height:        Intake/Output Summary (Last 24 hours) at 02/23/17 0948 Last data filed at 02/23/17 0700  Gross per 24 hour  Intake          2126.67 ml  Output                0 ml  Net          2126.67 ml   Filed Weights   02/22/17 1250 02/23/17 0500  Weight: 75.8 kg (167 lb) 77.8 kg (171 lb 8.3 oz)    Examination:  General exam: Appears calm and comfortable  Respiratory system: Clear to auscultation Anterior lung fields. Respiratory effort normal. Cardiovascular system: S1 & S2 heard, RRR. No JVD, murmurs, rubs, gallops or clicks. No  pedal edema. Gastrointestinal system: Abdomen is mildly distended, soft and tender to palpation in the left lower quadrant. No organomegaly or masses felt. + bowel sounds heard. Central nervous system: Alert and oriented. No focal neurological deficits. Extremities: Symmetric 5 x 5 power. Skin: No rashes, lesions or ulcers Psychiatry: Judgement and insight appear normal. Mood & affect appropriate.     Data Reviewed: I have personally reviewed following labs and imaging  studies  CBC:  Recent Labs Lab 02/22/17 1400 02/22/17 1413 02/23/17 0337  WBC 21.2*  --  15.7*  HGB 15.3* 15.3* 14.6  HCT 42.6 45.0 41.0  MCV 83.5  --  85.4  PLT 264  --  301   Basic Metabolic Panel:  Recent Labs Lab 02/22/17 1400 02/22/17 1413 02/23/17 0337 02/23/17 0748  NA 129* 132* 136  --   K 4.1 4.2 4.7  --   CL 94* 98* 105  --   CO2 25  --  19*  --   GLUCOSE 201* 198* 133*  --   BUN 31* 32* 28*  --   CREATININE 0.89 0.80 0.90  --   CALCIUM 8.8*  --  8.3*  --   MG  --   --   --  2.1   GFR: Estimated Creatinine Clearance: 46.9 mL/min (by C-G formula based on SCr of 0.9 mg/dL). Liver Function Tests:  Recent Labs Lab 02/22/17 1400 02/23/17 0337  AST 18 24  ALT 24 18  ALKPHOS 55 47  BILITOT 1.2 1.0  PROT 7.6 6.4*  ALBUMIN 3.5 3.0*    Recent Labs Lab 02/22/17 1400  LIPASE 22   No results for input(s): AMMONIA in the last 168 hours. Coagulation Profile:  Recent Labs Lab 02/23/17 0337  INR 1.03   Cardiac Enzymes: No results for input(s): CKTOTAL, CKMB, CKMBINDEX, TROPONINI in the last 168 hours. BNP (last 3 results) No results for input(s): PROBNP in the last 8760 hours. HbA1C: No results for input(s): HGBA1C in the last 72 hours. CBG:  Recent Labs Lab 02/22/17 2001 02/22/17 2208 02/23/17 0203 02/23/17 0554 02/23/17 0753  GLUCAP 191* 192* 161* 134* 112*   Lipid Profile: No results for input(s): CHOL, HDL, LDLCALC, TRIG, CHOLHDL, LDLDIRECT in the last 72 hours. Thyroid Function Tests: No results for input(s): TSH, T4TOTAL, FREET4, T3FREE, THYROIDAB in the last 72 hours. Anemia Panel: No results for input(s): VITAMINB12, FOLATE, FERRITIN, TIBC, IRON, RETICCTPCT in the last 72 hours. Sepsis Labs:  Recent Labs Lab 02/22/17 1753  LATICACIDVEN 0.9    No results found for this or any previous visit (from the past 240 hour(s)).       Radiology Studies: Dg Chest 2 View  Result Date: 02/22/2017 CLINICAL DATA:  83 year old  female with history of lower abdominal pain since this morning. Leukocytosis. EXAM: CHEST  2 VIEW COMPARISON:  Chest x-ray 08/31/2016. FINDINGS: Lung volumes are normal. No consolidative airspace disease. No pleural effusions. No pneumothorax. No pulmonary nodule or mass noted. Pulmonary vasculature and the cardiomediastinal silhouette are within normal limits. IMPRESSION: No radiographic evidence of acute cardiopulmonary disease. Electronically Signed   By: Vinnie Langton M.D.   On: 02/22/2017 17:07   Abd 1 View (kub)  Result Date: 02/23/2017 CLINICAL DATA:  Small bowel obstruction. EXAM: ABDOMEN - 1 VIEW COMPARISON:  CT of the abdomen 02/22/2017 FINDINGS: Gaseous distention of large and small bowel is decreasing. There is gas throughout colon. No obstruction is present. Contrast is present within a mildly distended urinary bladder. IMPRESSION: 1. Decreasing distention  of large and small bowel without significant obstruction. 2. Mild distention of the urinary bladder. Electronically Signed   By: San Morelle M.D.   On: 02/23/2017 07:08   Ct Abdomen Pelvis W Contrast  Result Date: 02/22/2017 CLINICAL DATA:  Lower abdominal pain EXAM: CT ABDOMEN AND PELVIS WITH CONTRAST TECHNIQUE: Multidetector CT imaging of the abdomen and pelvis was performed using the standard protocol following bolus administration of intravenous contrast. CONTRAST:  119mL ISOVUE-300 IOPAMIDOL (ISOVUE-300) INJECTION 61% COMPARISON:  06/18/2016 FINDINGS: Lower chest: No acute abnormality. Hepatobiliary: Fatty infiltration of the liver is noted. The gallbladder is within normal limits. Pancreas: Unremarkable. No pancreatic ductal dilatation or surrounding inflammatory changes. Spleen: Normal in size without focal abnormality. Adrenals/Urinary Tract: The adrenal glands are unremarkable. The kidneys demonstrate a normal enhancement pattern bilaterally. No renal calculi or obstructive changes are seen. The bladder is well distended.  Stomach/Bowel: Scattered diverticular change of the colon is noted. No diverticulitis is seen. The appendix has been surgically removed. In the left mid abdomen, there are multiple dilated loops of jejunum identified. A transition zone is noted in the midline anteriorly best seen on image number 78 of series to. This lies just beneath the anterior abdominal wall and likely represents a adhesions. Some fecalization of small bowel material is noted proximal to this. Vascular/Lymphatic: Aortic atherosclerosis. No enlarged abdominal or pelvic lymph nodes. Reproductive: Status post hysterectomy. No adnexal masses. Other: No abdominal wall hernia or abnormality. No abdominopelvic ascites. Musculoskeletal: Degenerative changes of lumbar spine are seen. No acute bony abnormality is noted. IMPRESSION: Multiple dilated loops of jejunum in the left mid abdomen with a focal transition zone as described. This likely represents an area of adhesions. Diverticulosis without diverticulitis. Chronic changes without acute abnormality. Electronically Signed   By: Inez Catalina M.D.   On: 02/22/2017 15:25        Scheduled Meds: . amLODipine  5 mg Oral Daily  . insulin aspart  0-5 Units Subcutaneous QHS  . insulin aspart  0-9 Units Subcutaneous Q4H  . insulin glargine  30 Units Subcutaneous QHS  . labetalol  100 mg Oral BID  . prednisoLONE acetate  1 drop Both Eyes QID  . sodium chloride flush  3 mL Intravenous Q12H   Continuous Infusions: . sodium chloride 100 mL/hr at 02/23/17 0700     LOS: 1 day    Time spent: 53 minutes    Hether Anselmo, MD [facility] Pager [phone]  If 7PM-7AM, please contact night-coverage [email or web address] Password [facility] 02/23/2017, 9:48 AM

## 2017-02-23 NOTE — Care Management Note (Signed)
Case Management Note  Patient Details  Name: [name] MRN: [id number] Date of Birth: Dec 22, 1930  Subjective/Objective:       urosepsis             Action/Plan: Date:  February 23, 2017  Chart reviewed for concurrent status and case management needs.  Will continue to follow patient progress.  Discharge Planning: following for needs  Expected discharge date: [id number]  Velva Harman, BSN, [place], [place]   Expected Discharge Date:  02/25/17               Expected Discharge Plan:  Home/Self Care  In-House Referral:     Discharge planning Services  CM Consult  Post Acute Care Choice:    Choice offered to:     DME Arranged:    DME Agency:     HH Arranged:    HH Agency:     Status of Service:  In process, will continue to follow  If discussed at Long Length of Stay Meetings, dates discussed:    Additional Comments:  Leeroy Cha, RN 02/23/2017, 9:19 AM

## 2017-02-24 ENCOUNTER — Inpatient Hospital Stay (HOSPITAL_COMMUNITY): Payer: Medicare Other

## 2017-02-24 LAB — GLUCOSE, CAPILLARY
GLUCOSE-CAPILLARY: 87 mg/dL (ref 65–99)
GLUCOSE-CAPILLARY: 85 mg/dL (ref 65–99)
GLUCOSE-CAPILLARY: 104 mg/dL — AB (ref 65–99)
Glucose-Capillary: 80 mg/dL (ref 65–99)
Glucose-Capillary: 107 mg/dL — ABNORMAL HIGH (ref 65–99)

## 2017-02-24 LAB — BASIC METABOLIC PANEL
ANION GAP: 7 (ref 5–15)
BUN: 25 mg/dL — ABNORMAL HIGH (ref 6–20)
CHLORIDE: 107 mmol/L (ref 101–111)
CO2: 23 mmol/L (ref 22–32)
CREATININE: 0.79 mg/dL (ref 0.44–1.00)
Calcium: 8.3 mg/dL — ABNORMAL LOW (ref 8.9–10.3)
GFR calc non Af Amer: >60 mL/min (ref >60)
Glucose, Bld: 92 mg/dL (ref 65–99)
Potassium: 4 mmol/L (ref 3.5–5.1)
SODIUM: 137 mmol/L (ref 135–145)

## 2017-02-24 LAB — CBC
HCT: 38.8 % (ref 36.0–46.0)
Hemoglobin: 13.2 g/dL (ref 12.0–15.0)
MCH: 29.1 pg (ref 26.0–34.0)
MCHC: 34 g/dL (ref 30.0–36.0)
MCV: 85.5 fL (ref 78.0–100.0)
Platelets: 208 10*3/uL (ref 150–400)
RBC: 4.54 MIL/uL (ref 3.87–5.11)
RDW: 13.8 % (ref 11.5–15.5)
WBC: 13.1 10*3/uL — AB (ref 4.0–10.5)

## 2017-02-24 LAB — HEMOGLOBIN A1C
HEMOGLOBIN A1C: 10.4 % — AB (ref 4.8–5.6)
Mean Plasma Glucose: 252 mg/dL

## 2017-02-24 LAB — MRSA PCR SCREENING: MRSA by PCR: NEGATIVE

## 2017-02-24 MED ORDER — ALPRAZOLAM 0.5 MG PO TABS
0.5000 mg | ORAL_TABLET | Freq: Three times a day (TID) | ORAL | Status: DC | PRN
  Administered 2017-02-26: 0.5 mg via ORAL
  Filled 2017-02-24: qty 1

## 2017-02-24 NOTE — Progress Notes (Signed)
Patient ID: [name], female   DOB: May 01, 1931, 83 y.o.   MRN: [id number]     Subjective: Patient reports nausea and vomiting on a couple of occasions early this morning. Still some left upper quadrant pain but gradually improving. Did require Medications. Patient reports flatus. Bowel movement was recorded yest  Objective: Vital signs in last 24 hours: Temp:  [98.4 F (36.9 C)-99.2 F (37.3 C)] 99.2 F (37.3 C) (08/14 0800) Pulse Rate:  [60-69] 69 (08/14 0830) Resp:  [12-23] 18 (08/14 0830) BP: (130-193)/(41-113) 146/90 (08/14 0830) SpO2:  [92 %-95 %] 95 % (08/14 0830) Weight:  [79.1 kg (174 lb 6.1 oz)] 79.1 kg (174 lb 6.1 oz) (08/14 0503) Last BM Date: 02/23/17  Intake/Output from previous day: 08/13 0701 - 08/14 0700 In: 1800 [I.V.:1800] Out: 1925 [Urine:1925] Intake/Output this shift: No intake/output data recorded.  General appearance: alert, cooperative and no distress GI: abnormal findings:  mild tenderness in the LUQ and mild distention. Somehigh-pitched bowel sounds.  Lab Results:   Recent Labs  02/23/17 0337 02/24/17 0320  WBC 15.7* 13.1*  HGB 14.6 13.2  HCT 41.0 38.8  PLT 174 208   BMET  Recent Labs  02/23/17 0337 02/24/17 0320  NA 136 137  K 4.7 4.0  CL 105 107  CO2 19* 23  GLUCOSE 133* 92  BUN 28* 25*  CREATININE 0.90 0.79  CALCIUM 8.3* 8.3*     Studies/Results: Dg Chest 2 View  Result Date: 02/22/2017 CLINICAL DATA:  83 year old female with history of lower abdominal pain since this morning. Leukocytosis. EXAM: CHEST  2 VIEW COMPARISON:  Chest x-ray 08/31/2016. FINDINGS: Lung volumes are normal. No consolidative airspace disease. No pleural effusions. No pneumothorax. No pulmonary nodule or mass noted. Pulmonary vasculature and the cardiomediastinal silhouette are within normal limits. IMPRESSION: No radiographic evidence of acute cardiopulmonary disease. Electronically Signed   By: Vinnie Langton M.D.   On: 02/22/2017 17:07   Abd 1  View (kub)  Result Date: 02/23/2017 CLINICAL DATA:  Small bowel obstruction. EXAM: ABDOMEN - 1 VIEW COMPARISON:  CT of the abdomen 02/22/2017 FINDINGS: Gaseous distention of large and small bowel is decreasing. There is gas throughout colon. No obstruction is present. Contrast is present within a mildly distended urinary bladder. IMPRESSION: 1. Decreasing distention of large and small bowel without significant obstruction. 2. Mild distention of the urinary bladder. Electronically Signed   By: San Morelle M.D.   On: 02/23/2017 07:08   Ct Abdomen Pelvis W Contrast  Result Date: 02/22/2017 CLINICAL DATA:  Lower abdominal pain EXAM: CT ABDOMEN AND PELVIS WITH CONTRAST TECHNIQUE: Multidetector CT imaging of the abdomen and pelvis was performed using the standard protocol following bolus administration of intravenous contrast. CONTRAST:  17mL ISOVUE-300 IOPAMIDOL (ISOVUE-300) INJECTION 61% COMPARISON:  06/18/2016 FINDINGS: Lower chest: No acute abnormality. Hepatobiliary: Fatty infiltration of the liver is noted. The gallbladder is within normal limits. Pancreas: Unremarkable. No pancreatic ductal dilatation or surrounding inflammatory changes. Spleen: Normal in size without focal abnormality. Adrenals/Urinary Tract: The adrenal glands are unremarkable. The kidneys demonstrate a normal enhancement pattern bilaterally. No renal calculi or obstructive changes are seen. The bladder is well distended. Stomach/Bowel: Scattered diverticular change of the colon is noted. No diverticulitis is seen. The appendix has been surgically removed. In the left mid abdomen, there are multiple dilated loops of jejunum identified. A transition zone is noted in the midline anteriorly best seen on image number 78 of series to. This lies just beneath the anterior abdominal wall  and likely represents a adhesions. Some fecalization of small bowel material is noted proximal to this. Vascular/Lymphatic: Aortic atherosclerosis. No  enlarged abdominal or pelvic lymph nodes. Reproductive: Status post hysterectomy. No adnexal masses. Other: No abdominal wall hernia or abnormality. No abdominopelvic ascites. Musculoskeletal: Degenerative changes of lumbar spine are seen. No acute bony abnormality is noted. IMPRESSION: Multiple dilated loops of jejunum in the left mid abdomen with a focal transition zone as described. This likely represents an area of adhesions. Diverticulosis without diverticulitis. Chronic changes without acute abnormality. Electronically Signed   By: Inez Catalina M.D.   On: 02/22/2017 15:25    Anti-infectives: Anti-infectives    None      Assessment/Plan: Patient with history of partial colectomy admitted with small bowel obstruction. Imaging showed complete resolution some issues with vomiting today and mild tenderness left upper quadrant and pain although this is improving White blood count gradually improving. Would continue nothing by mouth  Despite x-ray findings I am not sure that this is resolving. Continue close observation.    LOS: 2 days    Lalitha Ilyas T 02/24/2017

## 2017-02-24 NOTE — Evaluation (Signed)
Occupational Therapy Evaluation Patient Details Name: [name] MRN: [id number] DOB: 1931-02-10 Today's Date: 02/24/2017    History of Present Illness 83 y.o. female with medical history significant of hypertension, diabetes mellitus, history of diverticulitis, status post abdominal hysterectomy, appendectomy, colon surgery, history of colon cancer status post colon surgery 2006, prior history of small bowel obstruction approximately 2 years ago presented to the ED with a several hour history of severe left lower quadrant abdominal pain nonradiating in nature with associated nausea and several bouts of emesis. Dx of SBO   Clinical Impression   Pt was admitted for the above. She is mod I with adls at baseline and granddaughter assists with cleaning.  Pt has not eaten since Sunday and she is weak. She will benefit from continued OT to increase safety and independence with adls. Goals in acute are for supervision level. Pt currently needs mod A.    Follow Up Recommendations  Supervision/Assistance - 24 hour;Home health OT (vs SNF--pt adamently refuses SNF)    Equipment Recommendations  3 in 1 bedside commode    Recommendations for Other Services       Precautions / Restrictions Precautions Precautions: Fall Precaution Comments: fell outside a few days PTA  Restrictions Weight Bearing Restrictions: No      Mobility Bed Mobility               General bed mobility comments: min A to EOB with HOB raised  Transfers   Equipment used: Rolling walker (2 wheeled)   Sit to Stand: Mod assist         General transfer comment: cues for hand placement and assist to rise and stabilize. Pt tended to lean posteriorly    Balance                                           ADL either performed or assessed with clinical judgement   ADL Overall ADL's : Needs assistance/impaired Eating/Feeding: NPO   Grooming: Set up;Sitting;Brushing hair   Upper Body Bathing:  Set up;Sitting   Lower Body Bathing: Moderate assistance;Sit to/from stand   Upper Body Dressing : Minimal assistance;Sitting Upper Body Dressing Details (indicate cue type and reason): lines Lower Body Dressing: Moderate assistance;Sit to/from stand   Toilet Transfer: Moderate assistance;Stand-pivot;RW Toilet Transfer Details (indicate cue type and reason): to chair Toileting- Clothing Manipulation and Hygiene: Moderate assistance;Sit to/from stand         General ADL Comments: mod A for sit to stand for adls.    Min A to pivot     Vision         Perception     Praxis      Pertinent Vitals/Pain Pain Assessment: No/denies pain     Hand Dominance     Extremity/Trunk Assessment Upper Extremity Assessment Upper Extremity Assessment: Overall WFL for tasks assessed           Communication Communication Communication: HOH   Cognition Arousal/Alertness: Awake/alert Behavior During Therapy: WFL for tasks assessed/performed Overall Cognitive Status: Within Functional Limits for tasks assessed                                 General Comments: pt realizes she is weak but will not consider SNF   General Comments       Exercises  Shoulder Instructions      Home Living Family/patient expects to be discharged to:: Private residence Living Arrangements: Alone Available Help at Discharge: Available PRN/intermittently;Family Type of Home: House             Bathroom Shower/Tub: Teacher, early years/pre: Standard     Home Equipment: Cane - single point;Walker - 2 wheels;Shower seat;Grab bars - tub/shower          Prior Functioning/Environment Level of Independence: Independent with assistive device(s)        Comments: walks with SPC; independent bathing/dressing        OT Problem List: Decreased strength;Decreased activity tolerance;Impaired balance (sitting and/or standing);Decreased knowledge of use of DME or AE       OT Treatment/Interventions: Self-care/ADL training;DME and/or AE instruction;Patient/family education;Therapeutic activities    OT Goals(Current goals can be found in the care plan section) Acute Rehab OT Goals Patient Stated Goal: get strength back; return home OT Goal Formulation: With patient Time For Goal Achievement: 03/10/17 Potential to Achieve Goals: Good ADL Goals Pt Will Perform Lower Body Bathing: with supervision;sit to/from stand Pt Will Perform Lower Body Dressing: with supervision;sit to/from stand Pt Will Transfer to Toilet: with supervision;ambulating;bedside commode Pt Will Perform Toileting - Clothing Manipulation and hygiene: with supervision;sit to/from stand Additional ADL Goal #1: pt will be independent with bed mobility from flat bed in preparation for adls  OT Frequency:     Barriers to D/C:            Co-evaluation              AM-PAC PT "6 Clicks" Daily Activity     Outcome Measure Help from another person eating meals?: None Help from another person taking care of personal grooming?: A Little Help from another person toileting, which includes using toliet, bedpan, or urinal?: A Lot Help from another person bathing (including washing, rinsing, drying)?: A Lot Help from another person to put on and taking off regular upper body clothing?: A Little Help from another person to put on and taking off regular lower body clothing?: A Lot 6 Click Score: 16   End of Session    Activity Tolerance: Patient tolerated treatment well Patient left: in chair;with call bell/phone within reach;with chair alarm set  OT Visit Diagnosis: Unsteadiness on feet (R26.81);History of falling (Z91.81)                Time: 1017-5102 OT Time Calculation (min): 22 min Charges:  OT General Charges $OT Visit: 1 Procedure OT Evaluation $OT Eval Moderate Complexity: 1 Procedure G-Codes:     [name], OTR/L [phone] 02/24/2017  [name] 02/24/2017,  9:00 AM

## 2017-02-24 NOTE — Progress Notes (Addendum)
PROGRESS NOTE    [name]  QMG:[id number] DOB: Nov 06, 1930 DOA: 02/22/2017 PCP: [name], MD    Brief Narrative: Patient is 83 year old female history of hypertension, diabetes, history of diverticulitis, status post abdominal hysterectomy, appendectomy, colon surgery with a history of colon cancer status post colon surgery 2006 presenting to the ED with nausea vomiting abdominal pain found to have a small bowel obstruction and hypertensive urgency.   Assessment & Plan:   Principal Problem:   SBO (small bowel obstruction) (HCC) Active Problems:   Hypertensive urgency   Leukocytosis   Essential hypertension   Diabetes mellitus type 2 in obese (HCC)   Anxiety   Dizziness   Hyponatremia   Dehydration   Chronic diastolic CHF (congestive heart failure) ([place])   Acute urinary retention  #1 small bowel obstruction Likely secondary to adhesions. Patient presented with nausea vomiting significant abdominal pain. Patient states abdominal pain improving. Positive flatus. No bowel movement. Patient did have 2 episodes of emesis last night/early this morning with some associated nausea. Leukocytosis trending down. Continue bowel rest, IV fluids, supportive care. Patient has further more vomiting may likely benefit from a NG tube. Abdominal films pending.Patient has been seen in consultation by general surgery who are following.  #2 hypertensive urgency May have a component of pain associated with it. Patient currently on Norvasc 10 mg daily and  home regimen labetalol and flomax. Monitor heart rate. Follow.  #3 dehydration IV fluids.  #4 diabetes mellitus Hemoglobin A1c pending. CBGs have ranged from 79-87. Continue current dose of Lantus. Sliding scale insulin.  #5 leukocytosis Questionable etiology. Likely reactive secondary to problem #1. Chest x-ray negative. Blood cultures pending with no growth to date. Urinalysis pending. WBC trending down. No need for antibiotics at this  time.  #6 anxiety Ativan as needed.  #7 hyponatremia Likely secondary to dehydration. Improved with hydration.   #8 history of chronic diastolic CHF Diuretics on hold. Monitor volume closely. Decreased IV fluids to 75 mL per hour.  #9 urinary retention Foley catheter placed yesterday. Patient started on Flomax.  DVT prophylaxis: SCDs Code Status: DO NOT RESUSCITATE Family Communication: Updated patient. No family at bedside. Disposition Plan: Remain the step down unit for better blood pressure control. Likely home once acute medical issues have resolved.   Consultants:   Gen. surgery: Dr. Harlow Asa 02/22/2017  Procedures:  CT abdomen and pelvis 02/22/2017  Chest x-ray 12 2018  Abdominal films 02/23/2017  Antimicrobials:   None    Subjective: Patient sitting up in chair. Patient states abdominal pain improved. Patient states had 2 episodes of emesis last night with some associated nausea. Patient holding an emesis bag. Patient states passing some flatus. No bowel movement.   Objective: Vitals:   02/24/17 0600 02/24/17 0800 02/24/17 0830 02/24/17 0900  BP: (!) 172/48 (!) 180/55 (!) 146/90 (!) 170/53  Pulse: 67 69 69 65  Resp: 16 19 18 19   Temp:  99.2 F (37.3 C)    TempSrc:  Oral    SpO2: 94% 94% 95% 94%  Weight:      Height:        Intake/Output Summary (Last 24 hours) at 02/24/17 1007 Last data filed at 02/24/17 3267  Gross per 24 hour  Intake             1600 ml  Output             1925 ml  Net             -  325 ml   Filed Weights   02/22/17 1250 02/23/17 0500 02/24/17 0503  Weight: 75.8 kg (167 lb) 77.8 kg (171 lb 8.3 oz) 79.1 kg (174 lb 6.1 oz)    Examination:  General exam: Appears calm and comfortable  Respiratory system: Clear to auscultation Anterior lung fields. Respiratory effort normal. Cardiovascular system: S1 & S2 heard, RRR. No JVD, murmurs, rubs, gallops or clicks. No pedal edema. Gastrointestinal system: Abdomen is mildly distended,  soft and nontender to palpation. No organomegaly or masses felt. hypoactive bowel sounds heard. Central nervous system: Alert and oriented. No focal neurological deficits. Extremities: Symmetric 5 x 5 power. Skin: No rashes, lesions or ulcers Psychiatry: Judgement and insight appear normal. Mood & affect appropriate.     Data Reviewed: I have personally reviewed following labs and imaging studies  CBC:  Recent Labs Lab 02/22/17 1400 02/22/17 1413 02/23/17 0337 02/24/17 0320  WBC 21.2*  --  15.7* 13.1*  HGB 15.3* 15.3* 14.6 13.2  HCT 42.6 45.0 41.0 38.8  MCV 83.5  --  85.4 85.5  PLT 264  --  174 413   Basic Metabolic Panel:  Recent Labs Lab 02/22/17 1400 02/22/17 1413 02/23/17 0337 02/23/17 0748 02/24/17 0320  NA 129* 132* 136  --  137  K 4.1 4.2 4.7  --  4.0  CL 94* 98* 105  --  107  CO2 25  --  19*  --  23  GLUCOSE 201* 198* 133*  --  92  BUN 31* 32* 28*  --  25*  CREATININE 0.89 0.80 0.90  --  0.79  CALCIUM 8.8*  --  8.3*  --  8.3*  MG  --   --   --  2.1  --    GFR: Estimated Creatinine Clearance: 53.3 mL/min (by C-G formula based on SCr of 0.79 mg/dL). Liver Function Tests:  Recent Labs Lab 02/22/17 1400 02/23/17 0337  AST 18 24  ALT 24 18  ALKPHOS 55 47  BILITOT 1.2 1.0  PROT 7.6 6.4*  ALBUMIN 3.5 3.0*    Recent Labs Lab 02/22/17 1400  LIPASE 22   No results for input(s): AMMONIA in the last 168 hours. Coagulation Profile:  Recent Labs Lab 02/23/17 0337  INR 1.03   Cardiac Enzymes: No results for input(s): CKTOTAL, CKMB, CKMBINDEX, TROPONINI in the last 168 hours. BNP (last 3 results) No results for input(s): PROBNP in the last 8760 hours. HbA1C:  Recent Labs  02/22/17 1753  HGBA1C 10.4*   CBG:  Recent Labs Lab 02/23/17 1821 02/23/17 1915 02/23/17 2321 02/24/17 0318 02/24/17 0755  GLUCAP 101* 101* 79 87 85   Lipid Profile: No results for input(s): CHOL, HDL, LDLCALC, TRIG, CHOLHDL, LDLDIRECT in the last 72  hours. Thyroid Function Tests: No results for input(s): TSH, T4TOTAL, FREET4, T3FREE, THYROIDAB in the last 72 hours. Anemia Panel: No results for input(s): VITAMINB12, FOLATE, FERRITIN, TIBC, IRON, RETICCTPCT in the last 72 hours. Sepsis Labs:  Recent Labs Lab 02/22/17 1753  LATICACIDVEN 0.9    No results found for this or any previous visit (from the past 240 hour(s)).       Radiology Studies: Dg Chest 2 View  Result Date: 02/22/2017 CLINICAL DATA:  83 year old female with history of lower abdominal pain since this morning. Leukocytosis. EXAM: CHEST  2 VIEW COMPARISON:  Chest x-ray 08/31/2016. FINDINGS: Lung volumes are normal. No consolidative airspace disease. No pleural effusions. No pneumothorax. No pulmonary nodule or mass noted. Pulmonary vasculature and the cardiomediastinal silhouette  are within normal limits. IMPRESSION: No radiographic evidence of acute cardiopulmonary disease. Electronically Signed   By: Vinnie Langton M.D.   On: 02/22/2017 17:07   Abd 1 View (kub)  Result Date: 02/23/2017 CLINICAL DATA:  Small bowel obstruction. EXAM: ABDOMEN - 1 VIEW COMPARISON:  CT of the abdomen 02/22/2017 FINDINGS: Gaseous distention of large and small bowel is decreasing. There is gas throughout colon. No obstruction is present. Contrast is present within a mildly distended urinary bladder. IMPRESSION: 1. Decreasing distention of large and small bowel without significant obstruction. 2. Mild distention of the urinary bladder. Electronically Signed   By: San Morelle M.D.   On: 02/23/2017 07:08   Ct Abdomen Pelvis W Contrast  Result Date: 02/22/2017 CLINICAL DATA:  Lower abdominal pain EXAM: CT ABDOMEN AND PELVIS WITH CONTRAST TECHNIQUE: Multidetector CT imaging of the abdomen and pelvis was performed using the standard protocol following bolus administration of intravenous contrast. CONTRAST:  141mL ISOVUE-300 IOPAMIDOL (ISOVUE-300) INJECTION 61% COMPARISON:  06/18/2016  FINDINGS: Lower chest: No acute abnormality. Hepatobiliary: Fatty infiltration of the liver is noted. The gallbladder is within normal limits. Pancreas: Unremarkable. No pancreatic ductal dilatation or surrounding inflammatory changes. Spleen: Normal in size without focal abnormality. Adrenals/Urinary Tract: The adrenal glands are unremarkable. The kidneys demonstrate a normal enhancement pattern bilaterally. No renal calculi or obstructive changes are seen. The bladder is well distended. Stomach/Bowel: Scattered diverticular change of the colon is noted. No diverticulitis is seen. The appendix has been surgically removed. In the left mid abdomen, there are multiple dilated loops of jejunum identified. A transition zone is noted in the midline anteriorly best seen on image number 78 of series to. This lies just beneath the anterior abdominal wall and likely represents a adhesions. Some fecalization of small bowel material is noted proximal to this. Vascular/Lymphatic: Aortic atherosclerosis. No enlarged abdominal or pelvic lymph nodes. Reproductive: Status post hysterectomy. No adnexal masses. Other: No abdominal wall hernia or abnormality. No abdominopelvic ascites. Musculoskeletal: Degenerative changes of lumbar spine are seen. No acute bony abnormality is noted. IMPRESSION: Multiple dilated loops of jejunum in the left mid abdomen with a focal transition zone as described. This likely represents an area of adhesions. Diverticulosis without diverticulitis. Chronic changes without acute abnormality. Electronically Signed   By: Inez Catalina M.D.   On: 02/22/2017 15:25        Scheduled Meds: . amLODipine  10 mg Oral Daily  . insulin aspart  0-5 Units Subcutaneous QHS  . insulin aspart  0-9 Units Subcutaneous Q4H  . insulin glargine  30 Units Subcutaneous QHS  . labetalol  100 mg Oral BID  . prednisoLONE acetate  1 drop Both Eyes QID  . sodium chloride flush  3 mL Intravenous Q12H  . tamsulosin  0.4 mg  Oral QPC supper   Continuous Infusions: . sodium chloride 75 mL/hr at 02/24/17 0858     LOS: 2 days    Time spent: 16 minutes    Vic Esco, MD [facility] Pager [phone]  If 7PM-7AM, please contact night-coverage [email or web address] Password [email or web address] 02/24/2017, 10:07 AM

## 2017-02-24 NOTE — Progress Notes (Signed)
Pt had large bowel movement documented on previous shift and also reported by the pt. Dr. Grandville Silos notified.

## 2017-02-24 NOTE — Progress Notes (Signed)
Report to be called to Mechele Claude [phone] on [facility] at 2050.

## 2017-02-24 NOTE — Progress Notes (Signed)
Report given to [facility] on 4th floor. Pt to transfer via bed to rm 1423.

## 2017-02-25 ENCOUNTER — Inpatient Hospital Stay (HOSPITAL_COMMUNITY): Payer: Medicare Other

## 2017-02-25 LAB — CBC WITH DIFFERENTIAL/PLATELET
BASOS ABS: 0 10*3/uL (ref 0.0–0.1)
Basophils Relative: 0 %
EOS PCT: 3 %
Eosinophils Absolute: 0.2 10*3/uL (ref 0.0–0.7)
HEMATOCRIT: 33.6 % — AB (ref 36.0–46.0)
Hemoglobin: 11.5 g/dL — ABNORMAL LOW (ref 12.0–15.0)
LYMPHS PCT: 15 %
Lymphs Abs: 1.3 10*3/uL (ref 0.7–4.0)
MCH: 29.8 pg (ref 26.0–34.0)
MCHC: 34.2 g/dL (ref 30.0–36.0)
MCV: 87 fL (ref 78.0–100.0)
Monocytes Absolute: 0.6 10*3/uL (ref 0.1–1.0)
Monocytes Relative: 8 %
NEUTROS ABS: 6 10*3/uL (ref 1.7–7.7)
NEUTROS PCT: 74 %
PLATELETS: 167 10*3/uL (ref 150–400)
RBC: 3.86 MIL/uL — AB (ref 3.87–5.11)
RDW: 13.8 % (ref 11.5–15.5)
WBC: 8.1 10*3/uL (ref 4.0–10.5)

## 2017-02-25 LAB — URINE CULTURE: Culture: NO GROWTH

## 2017-02-25 LAB — GLUCOSE, CAPILLARY
GLUCOSE-CAPILLARY: 113 mg/dL — AB (ref 65–99)
GLUCOSE-CAPILLARY: 134 mg/dL — AB (ref 65–99)
Glucose-Capillary: 120 mg/dL — ABNORMAL HIGH (ref 65–99)
Glucose-Capillary: 127 mg/dL — ABNORMAL HIGH (ref 65–99)
Glucose-Capillary: 74 mg/dL (ref 65–99)
Glucose-Capillary: 87 mg/dL (ref 65–99)
Glucose-Capillary: 87 mg/dL (ref 65–99)

## 2017-02-25 LAB — BASIC METABOLIC PANEL
ANION GAP: 7 (ref 5–15)
BUN: 21 mg/dL — ABNORMAL HIGH (ref 6–20)
CO2: 21 mmol/L — ABNORMAL LOW (ref 22–32)
Calcium: 7.7 mg/dL — ABNORMAL LOW (ref 8.9–10.3)
Chloride: 110 mmol/L (ref 101–111)
Creatinine, Ser: 0.68 mg/dL (ref 0.44–1.00)
Glucose, Bld: 71 mg/dL (ref 65–99)
POTASSIUM: 3.6 mmol/L (ref 3.5–5.1)
SODIUM: 138 mmol/L (ref 135–145)

## 2017-02-25 LAB — MAGNESIUM: Magnesium: 1.8 mg/dL (ref 1.7–2.4)

## 2017-02-25 MED ORDER — DEXTROSE-NACL 5-0.9 % IV SOLN
INTRAVENOUS | Status: DC
  Administered 2017-02-25 – 2017-02-26 (×3): via INTRAVENOUS

## 2017-02-25 NOTE — Progress Notes (Signed)
Physical Therapy Treatment Patient Details Name: [name] MRN: [id number] DOB: 08/13/1930 Today's Date: 02/25/2017    History of Present Illness 83 y.o. female with medical history significant of hypertension, diabetes mellitus, history of diverticulitis, status post abdominal hysterectomy, appendectomy, colon surgery, history of colon cancer status post colon surgery 2006, prior history of small bowel obstruction approximately 2 years ago presented to the ED with a several hour history of severe left lower quadrant abdominal pain nonradiating in nature with associated nausea and several bouts of emesis. Dx of SBO    PT Comments    Pt ambulated in hallway (distance as tolerated).  Pt cooperative however verbalized minimally.  Follow Up Recommendations  Home health PT     Equipment Recommendations  None recommended by PT    Recommendations for Other Services       Precautions / Restrictions Precautions Precautions: Fall Restrictions Weight Bearing Restrictions: No    Mobility  Bed Mobility Overal bed mobility: Needs Assistance Bed Mobility: Supine to Sit     Supine to sit: Min assist     General bed mobility comments: VCs for technique, assist for trunk upright  Transfers Overall transfer level: Needs assistance Equipment used: Rolling walker (2 wheeled) Transfers: Sit to/from Stand Sit to Stand: Min assist         General transfer comment: verbal cues for technique, assist to rise and steady  Ambulation/Gait Ambulation/Gait assistance: Min guard Ambulation Distance (Feet): 200 Feet Assistive device: Rolling walker (2 wheeled) Gait Pattern/deviations: Step-through pattern;Decreased stride length     General Gait Details: steady with RW, no LOB, verbal cues for rolling RW not lifting and RW positioning   Stairs            Wheelchair Mobility    Modified Rankin (Stroke Patients Only)       Balance                                             Cognition Arousal/Alertness: Awake/alert Behavior During Therapy: WFL for tasks assessed/performed Overall Cognitive Status: Within Functional Limits for tasks assessed                                        Exercises      General Comments        Pertinent Vitals/Pain Pain Assessment: No/denies pain    Home Living                      Prior Function            PT Goals (current goals can now be found in the care plan section) Progress towards PT goals: Progressing toward goals    Frequency    Min 3X/week      PT Plan Current plan remains appropriate    Co-evaluation              AM-PAC PT "6 Clicks" Daily Activity  Outcome Measure  Difficulty turning over in bed (including adjusting bedclothes, sheets and blankets)?: None Difficulty moving from lying on back to sitting on the side of the bed? : A Little Difficulty sitting down on and standing up from a chair with arms (e.g., wheelchair, bedside commode, etc,.)?: Total Help needed moving to and from a bed  to chair (including a wheelchair)?: A Little Help needed walking in hospital room?: A Little Help needed climbing 3-5 steps with a railing? : A Little 6 Click Score: 17    End of Session Equipment Utilized During Treatment: Gait belt Activity Tolerance: Patient tolerated treatment well Patient left: in chair;with call bell/phone within reach Nurse Communication: Mobility status PT Visit Diagnosis: History of falling (Z91.81);Difficulty in walking, not elsewhere classified (R26.2)     Time: 7445-1460 PT Time Calculation (min) (ACUTE ONLY): 13 min  Charges:  $Gait Training: 8-22 mins                    G Codes:       Carmelia Bake, PT, DPT 02/25/2017 Pager: [phone]  York Ram E 02/25/2017, 1:01 PM

## 2017-02-25 NOTE — Progress Notes (Signed)
Inpatient Diabetes Program Recommendations  AACE/ADA: New Consensus Statement on Inpatient Glycemic Control (2015)  Target Ranges:  Prepandial:   less than 140 mg/dL      Peak postprandial:   less than 180 mg/dL (1-2 hours)      Critically ill patients:  140 - 180 mg/dL   Lab Results  Component Value Date   GLUCAP 113 (H) 02/25/2017   HGBA1C 10.4 (H) 02/22/2017    Review of Glycemic Control  Blood sugars in 70s and 80s in am. Decrease Lantus to prevent hypoglycemia.  Inpatient Diabetes Program Recommendations:   Decrease Lantus to 25 units QHS.  Will continue to follow.  Thank you. Lorenda Peck, RD, LDN, CDE Inpatient Diabetes Coordinator [phone]

## 2017-02-25 NOTE — Progress Notes (Signed)
   Subjective: Feels significantly better this morning. States she had a large bowel movement last night. She denies any abdominal pain or nausea today.  Objective: Vital signs in last 24 hours: Temp:  [98.1 F (36.7 C)-98.8 F (37.1 C)] 98.1 F (36.7 C) (08/15 0413) Pulse Rate:  [62-74] 66 (08/15 0413) Resp:  [16-23] 18 (08/15 0413) BP: (129-184)/(37-99) 150/56 (08/15 0413) SpO2:  [92 %-100 %] 98 % (08/15 0413) Weight:  [76.6 kg (168 lb 14 oz)] 76.6 kg (168 lb 14 oz) (08/15 0413) Last BM Date: 02/24/17  Intake/Output from previous day: 08/14 0701 - 08/15 0700 In: 1272.5 [I.V.:1272.5] Out: 477 [Urine:475; Stool:2] Intake/Output this shift: No intake/output data recorded.  General appearance: alert, cooperative and no distress GI: normal findings: soft, non-tender and nondistended  Lab Results:   Recent Labs  02/24/17 0320 02/25/17 0358  WBC 13.1* 8.1  HGB 13.2 11.5*  HCT 38.8 33.6*  PLT 208 167   BMET  Recent Labs  02/24/17 0320 02/25/17 0358  NA 137 138  K 4.0 3.6  CL 107 110  CO2 23 21*  GLUCOSE 92 71  BUN 25* 21*  CREATININE 0.79 0.68  CALCIUM 8.3* 7.7*     Studies/Results: Dg Abd 2 Views  Result Date: 02/24/2017 CLINICAL DATA:  History small bowel obstruction. EXAM: ABDOMEN - 2 VIEW COMPARISON:  Plain films the abdomen 02/23/2017 and CT abdomen and pelvis 02/22/2017. FINDINGS: No free intraperitoneal air is identified. Gaseous distention of loops of small bowel left lower quadrant persists with short air-fluid levels present. IMPRESSION: Persistent dilatation of small bowel loops left lower quadrant of the abdomen consistent with small bowel obstruction. No change since yesterday's exam. Electronically Signed   By: Inge Rise M.D.   On: 02/24/2017 11:32    Anti-infectives: Anti-infectives    None      Assessment/Plan: Small bowel obstruction likely secondary to adhesions Clinically appears much better today with no pain and a benign  abdominal exam, leukocytosis resolved. Repeat x-rays yesterday however showed still some degree of small bowel obstruction. Will keep nothing by mouth for now and I will recheck abdominal x-rays today. Hopefully these will have normalized as well and we can begin a diet.    LOS: 3 days    [name] T 8/15/2018Patient ID: [name], female   DOB: 11-Dec-1930, 83 y.o.   MRN: [id number]

## 2017-02-25 NOTE — Care Management Important Message (Signed)
Important Message  Patient Details  Name: [name] MRN: [id number] Date of Birth: [birth year]   Medicare Important Message Given:  Yes    Kerin Salen 02/25/2017, 11:31 AMImportant Message  Patient Details  Name: [name] MRN: [id number] Date of Birth: Oct 21, 1930   Medicare Important Message Given:  Yes    Kerin Salen 02/25/2017, 11:31 AM

## 2017-02-25 NOTE — Progress Notes (Signed)
PROGRESS NOTE        PATIENT DETAILS Name: [name] Age: 83 y.o. Sex: female Date of Birth: [birth year] Admit Date: 02/22/2017 Admitting Physician Eugenie Filler, MD XTA:VWPV, Edwinna Areola, MD  Brief Narrative: Patient is a 83 y.o. female with history of hypertension, diabetes admitted for bowel obstruction, slowly improving with supportive care. See below for further details  Subjective: Large bowel movement this morning. No abdominal pain or distention.   Assessment/Plan: Small bowel obstruction: Improving, had BM earlier this morning. Gen. surgery following and directing care. Etiology felt to be adhesions/bands from prior laparotomy.  Hypertension: Controlled, continue with amlodipine, labetalol. We'll continue to follow and adjust accordingly.  Insulin-dependent type 2 diabetes: CBGs stable, continue Lantus/SSI and follow.  Leukocytosis: Suspect secondary to inflammation from bowel obstruction, resolved with supportive care.  Acute urinary retention: Continue Flomax-voiding trial today-remove Foley.  Chronic diastolic heart failure: Warm status stable, cautiously continue with IV fluids while nothing by mouth. Resume diuretics when oral intake has resumed.  Telemetry (independently reviewed):nsr  Echo (reviewed):EF 60-65% on TTE done on 05/16/16  Morning labs/Imaging ordered: yes/no  DVT Prophylaxis: Prophylactic Lovenox   Code Status: Full code   Family Communication: None at bedside  Disposition Plan: Remain inpatient  Antimicrobial agents: Anti-infectives    None      Procedures: None  CONSULTS:  general surgery  Time spent: 25- minutes-Greater than 50% of this time was spent in counseling, explanation of diagnosis, planning of further management, and coordination of care.  MEDICATIONS: Scheduled Meds: . amLODipine  10 mg Oral Daily  . insulin aspart  0-5 Units Subcutaneous QHS  . insulin aspart  0-9 Units  Subcutaneous Q4H  . insulin glargine  30 Units Subcutaneous QHS  . labetalol  100 mg Oral BID  . prednisoLONE acetate  1 drop Both Eyes QID  . sodium chloride flush  3 mL Intravenous Q12H  . tamsulosin  0.4 mg Oral QPC supper   Continuous Infusions: . dextrose 5 % and 0.9% NaCl 75 mL/hr at 02/25/17 0502   PRN Meds:.acetaminophen **OR** acetaminophen, albuterol, ALPRAZolam, hydrALAZINE, labetalol, LORazepam, morphine injection, ondansetron **OR** ondansetron (ZOFRAN) IV   PHYSICAL EXAM: Vital signs: Vitals:   02/24/17 2000 02/24/17 2100 02/24/17 2152 02/25/17 0413  BP: (!) 139/38 (!) 137/99 (!) 134/46 (!) 150/56  Pulse: 69 67 62 66  Resp: 16 20 18 18   Temp:   98.4 F (36.9 C) 98.1 F (36.7 C)  TempSrc:   Oral Oral  SpO2: 95% 97% 100% 98%  Weight:    76.6 kg (168 lb 14 oz)  Height:       Filed Weights   02/23/17 0500 02/24/17 0503 02/25/17 0413  Weight: 77.8 kg (171 lb 8.3 oz) 79.1 kg (174 lb 6.1 oz) 76.6 kg (168 lb 14 oz)   Body mass index is 28.99 kg/m.   General appearance :Awake, alert, not in any distress. Speech Clear.  Eyes:, pupils equally reactive to light and accomodation,no scleral icterus. HEENT: Atraumatic and Normocephalic Neck: supple, no JVD. No cervical lymphadenopathy.  Resp:Good air entry bilaterally, no added sounds  CVS: S1 S2 regular, no murmurs.  GI: Bowel sounds present, Non tender and not distended with no gaurding, rigidity or rebound. Extremities: B/L Lower Ext shows no edema, both legs are warm to touch Neurology:  speech clear,Non focal, sensation is grossly intact.  Psychiatric: Normal judgment and insight. Alert and oriented x 3. Normal mood. Musculoskeletal:No digital cyanosis Skin:No Rash, warm and dry Wounds:N/A  I have personally reviewed following labs and imaging studies  LABORATORY DATA: CBC:  Recent Labs Lab 02/22/17 1400 02/22/17 1413 02/23/17 0337 02/24/17 0320 02/25/17 0358  WBC 21.2*  --  15.7* 13.1* 8.1  NEUTROABS   --   --   --   --  6.0  HGB 15.3* 15.3* 14.6 13.2 11.5*  HCT 42.6 45.0 41.0 38.8 33.6*  MCV 83.5  --  85.4 85.5 87.0  PLT 264  --  174 208 283    Basic Metabolic Panel:  Recent Labs Lab 02/22/17 1400 02/22/17 1413 02/23/17 0337 02/23/17 0748 02/24/17 0320 02/25/17 0358  NA 129* 132* 136  --  137 138  K 4.1 4.2 4.7  --  4.0 3.6  CL 94* 98* 105  --  107 110  CO2 25  --  19*  --  23 21*  GLUCOSE 201* 198* 133*  --  92 71  BUN 31* 32* 28*  --  25* 21*  CREATININE 0.89 0.80 0.90  --  0.79 0.68  CALCIUM 8.8*  --  8.3*  --  8.3* 7.7*  MG  --   --   --  2.1  --  1.8    GFR: Estimated Creatinine Clearance: 52.5 mL/min (by C-G formula based on SCr of 0.68 mg/dL).  Liver Function Tests:  Recent Labs Lab 02/22/17 1400 02/23/17 0337  AST 18 24  ALT 24 18  ALKPHOS 55 47  BILITOT 1.2 1.0  PROT 7.6 6.4*  ALBUMIN 3.5 3.0*    Recent Labs Lab 02/22/17 1400  LIPASE 22   No results for input(s): AMMONIA in the last 168 hours.  Coagulation Profile:  Recent Labs Lab 02/23/17 0337  INR 1.03    Cardiac Enzymes: No results for input(s): CKTOTAL, CKMB, CKMBINDEX, TROPONINI in the last 168 hours.  BNP (last 3 results) No results for input(s): PROBNP in the last 8760 hours.  HbA1C:  Recent Labs  02/22/17 1753  HGBA1C 10.4*    CBG:  Recent Labs Lab 02/24/17 1940 02/25/17 0032 02/25/17 0410 02/25/17 0802 02/25/17 1149  GLUCAP 107* 87 74 87 113*    Lipid Profile: No results for input(s): CHOL, HDL, LDLCALC, TRIG, CHOLHDL, LDLDIRECT in the last 72 hours.  Thyroid Function Tests: No results for input(s): TSH, T4TOTAL, FREET4, T3FREE, THYROIDAB in the last 72 hours.  Anemia Panel: No results for input(s): VITAMINB12, FOLATE, FERRITIN, TIBC, IRON, RETICCTPCT in the last 72 hours.  Urine analysis:    Component Value Date/Time   COLORURINE YELLOW 02/23/2017 [place] 02/23/2017 2315   LABSPEC 1.026 02/23/2017 2315   PHURINE 5.0 02/23/2017  2315   GLUCOSEU 50 (A) 02/23/2017 2315   HGBUR NEGATIVE 02/23/2017 2315   BILIRUBINUR NEGATIVE 02/23/2017 2315   KETONESUR 5 (A) 02/23/2017 2315   PROTEINUR 30 (A) 02/23/2017 2315   UROBILINOGEN 0.2 08/16/2010 2011   NITRITE NEGATIVE 02/23/2017 2315   LEUKOCYTESUR TRACE (A) 02/23/2017 2315    Sepsis Labs: Lactic Acid, Venous    Component Value Date/Time   LATICACIDVEN 0.9 02/22/2017 1753    MICROBIOLOGY: Recent Results (from the past 240 hour(s))  Culture, blood (Routine X 2) w Reflex to ID Panel     Status: None (Preliminary result)   Collection Time: 02/22/17  4:46 PM  Result Value Ref Range Status   Specimen Description BLOOD BLOOD  Final  Special Requests IN PEDIATRIC BOTTLE Blood Culture adequate volume  Final   Culture   Final    NO GROWTH 2 DAYS Performed at [facility], [place] [address] [place], [place] [address]    Report Status PENDING  Incomplete  Culture, blood (Routine X 2) w Reflex to ID Panel     Status: None (Preliminary result)   Collection Time: 02/22/17  4:51 PM  Result Value Ref Range Status   Specimen Description BLOOD BLOOD RIGHT HAND  Final   Special Requests IN PEDIATRIC BOTTLE Blood Culture adequate volume  Final   Culture   Final    NO GROWTH 2 DAYS Performed at [facility], [place] [address] [place], [place] [address]    Report Status PENDING  Incomplete  Culture, Urine     Status: None   Collection Time: 02/23/17 11:15 PM  Result Value Ref Range Status   Specimen Description URINE, RANDOM  Final   Special Requests NONE  Final   Culture   Final    NO GROWTH Performed at [facility], [place] [address], [place], [place] [address]    Report Status 02/25/2017 FINAL  Final  MRSA PCR Screening     Status: None   Collection Time: 02/24/17  8:40 AM  Result Value Ref Range Status   MRSA by PCR NEGATIVE NEGATIVE Final    Comment:        The GeneXpert MRSA Assay (FDA approved for NASAL specimens only), is one component of  a comprehensive MRSA colonization surveillance program. It is not intended to diagnose MRSA infection nor to guide or monitor treatment for MRSA infections.     RADIOLOGY STUDIES/RESULTS: Dg Chest 2 View  Result Date: 02/22/2017 CLINICAL DATA:  83 year old female with history of lower abdominal pain since this morning. Leukocytosis. EXAM: CHEST  2 VIEW COMPARISON:  Chest x-ray 08/31/2016. FINDINGS: Lung volumes are normal. No consolidative airspace disease. No pleural effusions. No pneumothorax. No pulmonary nodule or mass noted. Pulmonary vasculature and the cardiomediastinal silhouette are within normal limits. IMPRESSION: No radiographic evidence of acute cardiopulmonary disease. Electronically Signed   By: Vinnie Langton M.D.   On: 02/22/2017 17:07   Abd 1 View (kub)  Result Date: 02/23/2017 CLINICAL DATA:  Small bowel obstruction. EXAM: ABDOMEN - 1 VIEW COMPARISON:  CT of the abdomen 02/22/2017 FINDINGS: Gaseous distention of large and small bowel is decreasing. There is gas throughout colon. No obstruction is present. Contrast is present within a mildly distended urinary bladder. IMPRESSION: 1. Decreasing distention of large and small bowel without significant obstruction. 2. Mild distention of the urinary bladder. Electronically Signed   By: San Morelle M.D.   On: 02/23/2017 07:08   Ct Abdomen Pelvis W Contrast  Result Date: 02/22/2017 CLINICAL DATA:  Lower abdominal pain EXAM: CT ABDOMEN AND PELVIS WITH CONTRAST TECHNIQUE: Multidetector CT imaging of the abdomen and pelvis was performed using the standard protocol following bolus administration of intravenous contrast. CONTRAST:  16mL ISOVUE-300 IOPAMIDOL (ISOVUE-300) INJECTION 61% COMPARISON:  06/18/2016 FINDINGS: Lower chest: No acute abnormality. Hepatobiliary: Fatty infiltration of the liver is noted. The gallbladder is within normal limits. Pancreas: Unremarkable. No pancreatic ductal dilatation or surrounding  inflammatory changes. Spleen: Normal in size without focal abnormality. Adrenals/Urinary Tract: The adrenal glands are unremarkable. The kidneys demonstrate a normal enhancement pattern bilaterally. No renal calculi or obstructive changes are seen. The bladder is well distended. Stomach/Bowel: Scattered diverticular change of the colon is noted. No diverticulitis is  seen. The appendix has been surgically removed. In the left mid abdomen, there are multiple dilated loops of jejunum identified. A transition zone is noted in the midline anteriorly best seen on image number 78 of series to. This lies just beneath the anterior abdominal wall and likely represents a adhesions. Some fecalization of small bowel material is noted proximal to this. Vascular/Lymphatic: Aortic atherosclerosis. No enlarged abdominal or pelvic lymph nodes. Reproductive: Status post hysterectomy. No adnexal masses. Other: No abdominal wall hernia or abnormality. No abdominopelvic ascites. Musculoskeletal: Degenerative changes of lumbar spine are seen. No acute bony abnormality is noted. IMPRESSION: Multiple dilated loops of jejunum in the left mid abdomen with a focal transition zone as described. This likely represents an area of adhesions. Diverticulosis without diverticulitis. Chronic changes without acute abnormality. Electronically Signed   By: Inez Catalina M.D.   On: 02/22/2017 15:25   Dg Abd 2 Views  Result Date: 02/25/2017 CLINICAL DATA:  Small bowel obstruction. EXAM: ABDOMEN - 2 VIEW COMPARISON:  02/24/2017, 02/23/2017 and CT scan dated 02/22/2017 FINDINGS: There is persistent slight dilatation of multiple small bowel loops in the left lower quadrant as demonstrated on the prior CT scan. There is air scattered throughout the nondistended colon. Distal small bowel is not dilated. IMPRESSION: Persistent dilatation of small bowel loops in the left lower quadrant consistent with partial small-bowel obstruction. Electronically Signed    By: Lorriane Shire M.D.   On: 02/25/2017 09:14   Dg Abd 2 Views  Result Date: 02/24/2017 CLINICAL DATA:  History small bowel obstruction. EXAM: ABDOMEN - 2 VIEW COMPARISON:  Plain films the abdomen 02/23/2017 and CT abdomen and pelvis 02/22/2017. FINDINGS: No free intraperitoneal air is identified. Gaseous distention of loops of small bowel left lower quadrant persists with short air-fluid levels present. IMPRESSION: Persistent dilatation of small bowel loops left lower quadrant of the abdomen consistent with small bowel obstruction. No change since yesterday's exam. Electronically Signed   By: Inge Rise M.D.   On: 02/24/2017 11:32     LOS: 3 days   Oren Binet, MD  [facility] Pager:[phone]  If 7PM-7AM, please contact night-coverage [email or web address] Password [email or web address] 02/25/2017, 1:30 PM

## 2017-02-25 NOTE — Progress Notes (Signed)
Patient ID: [name], female   DOB: 1931/04/27, 83 y.o.   MRN: [id number]     Subjective: She states she is feeling much better today. Denies any abdominal pain or nausea. He had a large bowel movement last night.  Objective: Vital signs in last 24 hours: Temp:  [98.1 F (36.7 C)-98.8 F (37.1 C)] 98.1 F (36.7 C) (08/15 0413) Pulse Rate:  [62-74] 66 (08/15 0413) Resp:  [16-23] 18 (08/15 0413) BP: (129-184)/(37-99) 150/56 (08/15 0413) SpO2:  [92 %-100 %] 98 % (08/15 0413) Weight:  [76.6 kg (168 lb 14 oz)] 76.6 kg (168 lb 14 oz) (08/15 0413) Last BM Date: 02/24/17  Intake/Output from previous day: 08/14 0701 - 08/15 0700 In: 1272.5 [I.V.:1272.5] Out: 477 [Urine:475; Stool:2] Intake/Output this shift: No intake/output data recorded.  General appearance: alert, cooperative and no distress GI: normal findings: soft, non-tender and nondistended  Lab Results:   Recent Labs  02/24/17 0320 02/25/17 0358  WBC 13.1* 8.1  HGB 13.2 11.5*  HCT 38.8 33.6*  PLT 208 167   BMET  Recent Labs  02/24/17 0320 02/25/17 0358  NA 137 138  K 4.0 3.6  CL 107 110  CO2 23 21*  GLUCOSE 92 71  BUN 25* 21*  CREATININE 0.79 0.68  CALCIUM 8.3* 7.7*     Studies/Results: Dg Abd 2 Views  Result Date: 02/24/2017 CLINICAL DATA:  History small bowel obstruction. EXAM: ABDOMEN - 2 VIEW COMPARISON:  Plain films the abdomen 02/23/2017 and CT abdomen and pelvis 02/22/2017. FINDINGS: No free intraperitoneal air is identified. Gaseous distention of loops of small bowel left lower quadrant persists with short air-fluid levels present. IMPRESSION: Persistent dilatation of small bowel loops left lower quadrant of the abdomen consistent with small bowel obstruction. No change since yesterday's exam. Electronically Signed   By: Inge Rise M.D.   On: 02/24/2017 11:32    Anti-infectives: Anti-infectives    None      Assessment/Plan: Small bowel obstruction likely secondary to adhesions.She  is now clinically doing very well although repeat x-rays yesterday showed some recurrent small bowel distention. Leave nothing by mouth today and repeat films. When these normalize we could begin diet.    LOS: 3 days    Arlisha Patalano T 02/25/2017

## 2017-02-26 ENCOUNTER — Encounter (HOSPITAL_COMMUNITY): Payer: Self-pay

## 2017-02-26 ENCOUNTER — Inpatient Hospital Stay (HOSPITAL_COMMUNITY): Payer: Medicare Other

## 2017-02-26 LAB — BASIC METABOLIC PANEL
Anion gap: 6 (ref 5–15)
BUN: 17 mg/dL (ref 6–20)
CHLORIDE: 112 mmol/L — AB (ref 101–111)
CO2: 21 mmol/L — ABNORMAL LOW (ref 22–32)
Calcium: 7.7 mg/dL — ABNORMAL LOW (ref 8.9–10.3)
Creatinine, Ser: 0.64 mg/dL (ref 0.44–1.00)
GFR calc Af Amer: >60 mL/min (ref >60)
GLUCOSE: 94 mg/dL (ref 65–99)
Potassium: 3.1 mmol/L — ABNORMAL LOW (ref 3.5–5.1)
SODIUM: 139 mmol/L (ref 135–145)

## 2017-02-26 LAB — GLUCOSE, CAPILLARY
GLUCOSE-CAPILLARY: 89 mg/dL (ref 65–99)
GLUCOSE-CAPILLARY: 80 mg/dL (ref 65–99)
GLUCOSE-CAPILLARY: 103 mg/dL — AB (ref 65–99)
GLUCOSE-CAPILLARY: 154 mg/dL — AB (ref 65–99)
Glucose-Capillary: 112 mg/dL — ABNORMAL HIGH (ref 65–99)
Glucose-Capillary: 77 mg/dL (ref 65–99)
Glucose-Capillary: 90 mg/dL (ref 65–99)

## 2017-02-26 MED ORDER — BISACODYL 10 MG RE SUPP
10.0000 mg | Freq: Once | RECTAL | Status: AC
  Administered 2017-02-26: 10 mg via RECTAL
  Filled 2017-02-26: qty 1

## 2017-02-26 MED ORDER — MUPIROCIN 2 % EX OINT
TOPICAL_OINTMENT | Freq: Two times a day (BID) | CUTANEOUS | Status: DC
  Administered 2017-02-26 – 2017-02-27 (×3): via TOPICAL
  Filled 2017-02-26 (×3): qty 22

## 2017-02-26 MED ORDER — INSULIN ASPART 100 UNIT/ML ~~LOC~~ SOLN
0.0000 [IU] | Freq: Three times a day (TID) | SUBCUTANEOUS | Status: DC
  Administered 2017-02-28: 1 [IU] via SUBCUTANEOUS

## 2017-02-26 MED ORDER — POTASSIUM CHLORIDE CRYS ER 20 MEQ PO TBCR
40.0000 meq | EXTENDED_RELEASE_TABLET | Freq: Two times a day (BID) | ORAL | Status: AC
  Administered 2017-02-26 (×2): 40 meq via ORAL
  Filled 2017-02-26 (×2): qty 2

## 2017-02-26 MED ORDER — DOCUSATE SODIUM 100 MG PO CAPS
100.0000 mg | ORAL_CAPSULE | Freq: Two times a day (BID) | ORAL | Status: DC
  Administered 2017-02-26 – 2017-02-28 (×5): 100 mg via ORAL
  Filled 2017-02-26 (×5): qty 1

## 2017-02-26 MED ORDER — INSULIN GLARGINE 100 UNIT/ML ~~LOC~~ SOLN
20.0000 [IU] | Freq: Every day | SUBCUTANEOUS | Status: DC
  Administered 2017-02-26 – 2017-02-27 (×2): 20 [IU] via SUBCUTANEOUS
  Filled 2017-02-26 (×3): qty 0.2

## 2017-02-26 NOTE — Care Management Note (Signed)
Case Management Note  Patient Details  Name: [name] MRN: [id number] Date of Birth: Feb 17, 1931  Subjective/Objective:  83 yo female admitted with SBO.                  Action/Plan: Plan to discharge home with [facility] HH.   Expected Discharge Date:  02/25/17               Expected Discharge Plan:  Home/Self Care  In-House Referral:     Discharge planning Services  CM Consult  Post Acute Care Choice:    Choice offered to:     DME Arranged:    DME Agency:     HH Arranged:  RN, PT, Nurse's Aide [place] Agency:  [place]  Status of Service:  In process, will continue to follow  If discussed at Long Length of Stay Meetings, dates discussed:    Additional CommentsPurcell Mouton, RN 02/26/2017, 10:29 AM
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
## 2017-02-26 NOTE — Progress Notes (Signed)
Pt current IV infiltrated, refused to have IV restarted. Have talked with pt with family at bedside. Will cont to enc. Restart. SRP,RN
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
## 2017-02-26 NOTE — Consult Note (Signed)
   [facility] CM Inpatient Consult   02/26/2017  [name] 1930/07/19 [id number]    Spoke with patient and family at bedside to discuss [place] Management program services.   Both patient and family decline having transition of care, medication, transportation, or disease management, or community resource needs.  Provided [facility] Care Management brochure with contact information and 24-hr nurse line magnet to contact if needed.  Will make inpatient RNCM aware that [place] Management services were declined.   Marthenia Rolling, MSN-Ed, RN,BSN [facility] Liaison [phone]

## 2017-02-26 NOTE — Progress Notes (Signed)
  PROGRESS NOTE  [name] UQJ:[id number] DOB: 1930/09/19 DOA: 02/22/2017 PCP: [name], MD  Brief Narrative: 83 year old woman admitted for small bowel obstruction.  Assessment/Plan Small bowel traction. Felt to be secondary to adhesions, bands from prior laparotomy. -Persistent by imaging however bowels moving and the patient asymptomatic. Management per general surgery, plan to repeat films tomorrow.  Essential hypertension. -Stable. Continue amlodipine, labetalol  Insulin-dependent diabetes mellitus. Hemoglobin A1c 10.4. -Blood sugars borderline low. Decrease Lantus to 25 units daily at bedtime, continue sliding scale insulin.  Acute urinary retention. -Continue Flomax  Chronic diastolic congestive heart failure   DVT prophylaxis: SCDs Code Status: DNR Family Communication: none Disposition Plan: Home with home health PT, OT  Murray Hodgkins, MD  [facility] Direct contact: [phone] --[address]ia [place]  --[email or web address]; password [email or web address]  7PM-7AM contact night coverage as above 02/26/2017, 2:07 PM  LOS: 4 days   Consultants:  General surgery  Procedures:    Antimicrobials:    Interval history/Subjective: Feels better. Bowels moving. No abdominal pain. No nausea or vomiting.  Objective: Vitals: Afebrile, 97.7, 62, 146/56, 97% on room air   Exam:     Constitutional: Appears calm, comfortable. Sitting in chair.  Cardiovascular. Regular rate and rhythm. No murmur, rub or gallop.  Respiratory. Clear to auscultation bilaterally. No wheezes, rales or rhonchi. Normal respiratory effort.  Abdomen soft, nontender  Psychiatric. Grossly normal mood and affect. Speech fluent and appropriate.   I have personally reviewed the following:   Labs: Basic metabolic panel unremarkable except potassium 3.1.   Imaging studies: Abdominal x-ray Persistent dilated loops of small bowel consistent with partial small bowel extraction    Scheduled  Meds: . amLODipine  10 mg Oral Daily  . docusate sodium  100 mg Oral BID  . insulin aspart  0-5 Units Subcutaneous QHS  . insulin aspart  0-9 Units Subcutaneous TID WC  . insulin glargine  30 Units Subcutaneous QHS  . labetalol  100 mg Oral BID  . mupirocin ointment   Topical BID  . potassium chloride  40 mEq Oral BID  . prednisoLONE acetate  1 drop Both Eyes QID  . sodium chloride flush  3 mL Intravenous Q12H  . tamsulosin  0.4 mg Oral QPC supper   Continuous Infusions: . dextrose 5 % and 0.9% NaCl 75 mL/hr at 02/26/17 0520    Principal Problem:   SBO (small bowel obstruction) (HCC) Active Problems:   Essential hypertension   Diabetes mellitus type 2 in obese (HCC)   Anxiety   Dizziness   Hypertensive urgency   Leukocytosis   Hyponatremia   Dehydration   Chronic diastolic CHF (congestive heart failure) ([place])   Acute urinary retention   LOS: 4 days

## 2017-02-26 NOTE — Progress Notes (Signed)
Pt had large brown soft stool. Suppository given earlier in am. SRP, RN

## 2017-02-26 NOTE — Progress Notes (Signed)
Occupational Therapy Treatment Patient Details Name: [name] MRN: [id number] DOB: Aug 24, 1930 Today's Date: 02/26/2017    History of present illness 83 y.o. female with medical history significant of hypertension, diabetes mellitus, history of diverticulitis, status post abdominal hysterectomy, appendectomy, colon surgery, history of colon cancer status post colon surgery 2006, prior history of small bowel obstruction approximately 2 years ago presented to the ED with a several hour history of severe left lower quadrant abdominal pain nonradiating in nature with associated nausea and several bouts of emesis. Dx of SBO   OT comments  Pt is much improved from initial evaluation. She still needs min guard for safety with standing and walking.  Able to reach to feet for adls today  Follow Up Recommendations  Supervision/Assistance - 24 hour;Home health OT    Equipment Recommendations  None recommended by OT (pt has 3:1 commode)    Recommendations for Other Services      Precautions / Restrictions Precautions Precautions: Fall Restrictions Weight Bearing Restrictions: No       Mobility Bed Mobility         Supine to sit: Independent     General bed mobility comments: cued to roll over, but pt got up using abdominals without complaints  Transfers       Sit to Stand: Min guard         General transfer comment: for safety; cues for UE placement    Balance                                           ADL either performed or assessed with clinical judgement   ADL               Lower Body Bathing: Supervison/ safety;Sit to/from stand       Lower Body Dressing: Supervision/safety;Sit to/from stand   Toilet Transfer: Min guard;Ambulation;RW;BSC             General ADL Comments: pt now able to reach to feet and able to don socks and shoes. She has a 3:1 at home.  Able to get up from bed independently (flat, no rail).  Family member  present at beginning of session and states that pt usually sleeps on couch     Vision       Perception     Praxis      Cognition Arousal/Alertness: Awake/alert Behavior During Therapy: WFL for tasks assessed/performed                                   General Comments: Pt is very HOH.  With multiple complaints today.  She states, "He said it might not all be gone, but I don't believe him"        Exercises     Shoulder Instructions       General Comments      Pertinent Vitals/ Pain       Pain Assessment: No/denies pain  Home Living                                          Prior Functioning/Environment              Frequency  Progress Toward Goals  OT Goals(current goals can now be found in the care plan section)  Progress towards OT goals: Progressing toward goals  Acute Rehab OT Goals Time For Goal Achievement: 03/10/17  Plan      Co-evaluation                 AM-PAC PT "6 Clicks" Daily Activity     Outcome Measure   Help from another person eating meals?: None Help from another person taking care of personal grooming?: A Little Help from another person toileting, which includes using toliet, bedpan, or urinal?: A Little Help from another person bathing (including washing, rinsing, drying)?: A Little Help from another person to put on and taking off regular upper body clothing?: A Little Help from another person to put on and taking off regular lower body clothing?: A Little 6 Click Score: 19    End of Session    OT Visit Diagnosis: Unsteadiness on feet (R26.81);History of falling (Z91.81)   Activity Tolerance Patient tolerated treatment well   Patient Left in chair;with call bell/phone within reach   Nurse Communication  (no alarm pad in chair (chair was made up))        Time: 1410-1432 OT Time Calculation (min): 22 min  Charges: OT General Charges $OT Visit: 1 Procedure OT  Treatments $Self Care/Home Management : 8-22 mins  Lesle Chris, OTR/L [phone] 02/26/2017   [place] 02/26/2017, 2:57 PM

## 2017-02-27 ENCOUNTER — Inpatient Hospital Stay (HOSPITAL_COMMUNITY): Payer: Medicare Other

## 2017-02-27 LAB — BASIC METABOLIC PANEL
Anion gap: 5 (ref 5–15)
BUN: 11 mg/dL (ref 6–20)
CALCIUM: 7.6 mg/dL — AB (ref 8.9–10.3)
CHLORIDE: 114 mmol/L — AB (ref 101–111)
CO2: 19 mmol/L — AB (ref 22–32)
CREATININE: 0.66 mg/dL (ref 0.44–1.00)
GFR calc non Af Amer: >60 mL/min (ref >60)
Glucose, Bld: 71 mg/dL (ref 65–99)
Potassium: 4.1 mmol/L (ref 3.5–5.1)
Sodium: 138 mmol/L (ref 135–145)

## 2017-02-27 LAB — GLUCOSE, CAPILLARY
GLUCOSE-CAPILLARY: 68 mg/dL (ref 65–99)
GLUCOSE-CAPILLARY: 166 mg/dL — AB (ref 65–99)
Glucose-Capillary: 114 mg/dL — ABNORMAL HIGH (ref 65–99)
Glucose-Capillary: 116 mg/dL — ABNORMAL HIGH (ref 65–99)
Glucose-Capillary: 119 mg/dL — ABNORMAL HIGH (ref 65–99)
Glucose-Capillary: 116 mg/dL — ABNORMAL HIGH (ref 65–99)
Glucose-Capillary: 151 mg/dL — ABNORMAL HIGH (ref 65–99)

## 2017-02-27 MED ORDER — LORAZEPAM 2 MG/ML IJ SOLN
1.0000 mg | Freq: Once | INTRAMUSCULAR | Status: AC
  Administered 2017-02-27: 1 mg via INTRAVENOUS
  Filled 2017-02-27: qty 1

## 2017-02-27 MED ORDER — DEXTROSE 50 % IV SOLN
25.0000 mL | Freq: Once | INTRAVENOUS | Status: AC
  Administered 2017-02-27: 25 mL via INTRAVENOUS
  Filled 2017-02-27: qty 50

## 2017-02-27 MED ORDER — FUROSEMIDE 40 MG PO TABS
40.0000 mg | ORAL_TABLET | Freq: Every day | ORAL | Status: DC
  Administered 2017-02-27 – 2017-02-28 (×2): 40 mg via ORAL
  Filled 2017-02-27 (×2): qty 1

## 2017-02-27 NOTE — Progress Notes (Signed)
Spoke with MD, Breathing treatment noted, will page Resp. SRP, RN

## 2017-02-27 NOTE — Progress Notes (Signed)
Pt will discharge with [facility]. Referral given to in house rep.
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
## 2017-02-27 NOTE — Progress Notes (Signed)
[facility] Progress Note      Assessment/Plan Principal Problem:   SBO (small bowel obstruction) (HCC) Active Problems:   Essential hypertension   Diabetes mellitus type 2 in obese (HCC)   Anxiety   Dizziness   Hypertensive urgency   Leukocytosis   Hyponatremia   Dehydration   Chronic diastolic CHF (congestive heart failure) ([place])   Acute urinary retention  SBO - abd xray today showed Resolution of previously seen small bowel dilatation - having BMs and clinical exam benign - tolerated clears from floor  FEN - fulls, d/c'd IVF VTE - SCDs ID - no abx F/U: with [facility] as needed  Plan: Fulls. Advance diet as tolerated. Stopped IVF. If pt tolerates a soft diet this evening she will be okay for discharge from our standpoint.    LOS: 5 days    Subjective:  CC: SBO  Family at bedside. Nurse states pt just woke up. Pt is denying abdominal pain or nausea. Eating jello at the time of my exam. Had 3 BM's yesterday. Family states pt gets very restless if has a hospital stay longer than a few days. Family states the pt is very anxious to leave. Some concern about confusion but the family states she knew them when they came in and she knows our president and her full DOB. She did not know where she was. No new complaints.   Objective: Vital signs in last 24 hours: Temp:  [97.7 F (36.5 C)-98.7 F (37.1 C)] 98 F (36.7 C) (08/17 0522) Pulse Rate:  [62-76] 72 (08/17 0522) Resp:  [18-20] 20 (08/17 0522) BP: (142-176)/(52-67) 142/52 (08/17 0522) SpO2:  [97 %-98 %] 98 % (08/17 0522) Weight:  [175 lb 0.7 oz (79.4 kg)] 175 lb 0.7 oz (79.4 kg) (08/17 0548) Last BM Date: 02/26/17  Intake/Output from previous day: 08/16 0701 - 08/17 0700 In: 1895 [P.O.:120; I.V.:1775] Out: 1601 [Urine:1600; Stool:1] Intake/Output this shift: No intake/output data recorded.  PE: Gen:  Alert, NAD, pleasant, cooperative Card:  RRR, no M/G/R heard Pulm:  Rate and effort  normal Abd: Soft, NT/ND, +BS, no HSM Skin: no rashes noted, warm and dry Neuro: alert to person and time but not place   Anti-infectives: Anti-infectives    None      Lab Results:   Recent Labs  02/25/17 0358  WBC 8.1  HGB 11.5*  HCT 33.6*  PLT 167   BMET  Recent Labs  02/26/17 0413 02/27/17 0342  NA 139 138  K 3.1* 4.1  CL 112* 114*  CO2 21* 19*  GLUCOSE 94 71  BUN 17 11  CREATININE 0.64 0.66  CALCIUM 7.7* 7.6*   PT/INR No results for input(s): LABPROT, INR in the last 72 hours. CMP     Component Value Date/Time   NA 138 02/27/2017 0342   K 4.1 02/27/2017 0342   CL 114 (H) 02/27/2017 0342   CO2 19 (L) 02/27/2017 0342   GLUCOSE 71 02/27/2017 0342   BUN 11 02/27/2017 0342   CREATININE 0.66 02/27/2017 0342   CREATININE 1.13 (H) 03/17/2015 1414   CALCIUM 7.6 (L) 02/27/2017 0342   PROT 6.4 (L) 02/23/2017 0337   ALBUMIN 3.0 (L) 02/23/2017 0337   AST 24 02/23/2017 0337   ALT 18 02/23/2017 0337   ALKPHOS 47 02/23/2017 0337   BILITOT 1.0 02/23/2017 0337   GFRNONAA >60 02/27/2017 0342   GFRNONAA 45 (L) 03/17/2015 1414   GFRAA >60 02/27/2017 0342   GFRAA 52 (L) 03/17/2015 1414  Lipase     Component Value Date/Time   LIPASE 22 02/22/2017 1400    Studies/Results: Dg Abd 1 View  Result Date: 02/27/2017 CLINICAL DATA:  Followup small bowel obstruction EXAM: ABDOMEN - 1 VIEW COMPARISON:  02/26/2017 FINDINGS: Scattered large and small bowel gas is noted. Previously seen dilated loops of small bowel have resolved in the interval. No free air is seen. No bony abnormality is noted. Postsurgical changes are again noted. IMPRESSION: Resolution of previously seen small bowel dilatation. Electronically Signed   By: Inez Catalina M.D.   On: 02/27/2017 06:59   Dg Abd 2 Views  Result Date: 02/26/2017 CLINICAL DATA:  Follow-up small-bowel obstruction. EXAM: ABDOMEN - 2 VIEW COMPARISON:  02/25/2017.  CT 02/22/2017. FINDINGS: Soft tissue structures are unremarkable.  Persistent small bowel distention left lower quadrant. Colonic gas pattern is normal. Findings consistent with persistent partial small bowel obstruction. No free air. Surgical clips are noted the pelvis. Degenerative changes lumbar spine and both hips. IMPRESSION: Persistent dilated loops of small bowel in the the left lower quadrant. Colonic gas pattern is normal. Findings consistent with persistent partial small bowel obstruction. Electronically Signed   By: Marcello Moores  Register   On: 02/26/2017 08:25      Kalman Drape , [facility] 02/27/2017, 10:31 AM Pager: [phone] Consults: [phone] Mon-Fri 7:00 am-4:30 pm Sat-Sun 7:00 am-11:30 am

## 2017-02-27 NOTE — Progress Notes (Signed)
Pt noted with wheezing and states unable to catch my breath, states when this happen at home," I take a breathing treatment".  O2 sat MD notified. SRP,RN

## 2017-02-27 NOTE — Progress Notes (Signed)
Hypoglycemic Event  CBG: 68   Treatment: D50 IV 25 mL  Symptoms: None  Follow-up CBG: Time: 0346 CBG Result: 68  Possible Reasons for Event: Inadequate meal intake NPO-Clears  Comments/MD notified: Charlsie Quest O'Bryant

## 2017-02-27 NOTE — Progress Notes (Signed)
Physical Therapy Treatment Patient Details Name: [name] MRN: [id number] DOB: [birth year] Today's Date: 02/27/2017    History of Present Illness 83 y.o. female with medical history significant of hypertension, diabetes mellitus, history of diverticulitis, status post abdominal hysterectomy, appendectomy, colon surgery, history of colon cancer status post colon surgery 2006, prior history of small bowel obstruction approximately 2 years ago presented to the ED with a several hour history of severe left lower quadrant abdominal pain nonradiating in nature with associated nausea and several bouts of emesis. Dx of SBO    PT Comments    Pt tolerated session well today. Pt had slight LOB upon standing but was able to steady with ambulation. Pt fatigued today after approx. 120 ft of ambulation and requested to go back to the room.   Follow Up Recommendations  Home health PT;Supervision/Assistance - 24 hour     Equipment Recommendations  None recommended by PT    Recommendations for Other Services       Precautions / Restrictions Precautions Precautions: Fall    Mobility  Bed Mobility Overal bed mobility: Needs Assistance Bed Mobility: Supine to Sit     Supine to sit: HOB elevated;Supervision     General bed mobility comments: Pt takes increased time but able to sit herself up at EOB   Transfers Overall transfer level: Needs assistance Equipment used: Rolling walker (2 wheeled) Transfers: Sit to/from Stand Sit to Stand: Min assist         General transfer comment: Pt had initial LOB upon standing but was able to correct upon gait initiation, min assist provided to steady   Ambulation/Gait Ambulation/Gait assistance: Min guard Ambulation Distance (Feet): 120 Feet Assistive device: Rolling walker (2 wheeled) Gait Pattern/deviations: Step-through pattern;Decreased stride length     General Gait Details: Pt was steady with RW, pt reports SOB, SPO2 at 97% on RA while  ambulating with HR at 76 BPM, cues provided to roll not lift walker    Stairs            Wheelchair Mobility    Modified Rankin (Stroke Patients Only)       Balance                                            Cognition Arousal/Alertness: Awake/alert Behavior During Therapy: WFL for tasks assessed/performed                                   General Comments: Pt is HOH and has delayed responses, cognition does not appear at baseline, pt with confusion and agitation last night      Exercises      General Comments        Pertinent Vitals/Pain Pain Assessment: No/denies pain    Home Living                      Prior Function            PT Goals (current goals can now be found in the care plan section) Progress towards PT goals: Progressing toward goals    Frequency    Min 3X/week      PT Plan Current plan remains appropriate    Co-evaluation              AM-PAC  PT "6 Clicks" Daily Activity  Outcome Measure  Difficulty turning over in bed (including adjusting bedclothes, sheets and blankets)?: None Difficulty moving from lying on back to sitting on the side of the bed? : None Difficulty sitting down on and standing up from a chair with arms (e.g., wheelchair, bedside commode, etc,.)?: Unable Help needed moving to and from a bed to chair (including a wheelchair)?: A Little Help needed walking in hospital room?: A Little Help needed climbing 3-5 steps with a railing? : A Little 6 Click Score: 18    End of Session Equipment Utilized During Treatment: Gait belt Activity Tolerance: Patient tolerated treatment well Patient left: in chair;with chair alarm set;with call bell/phone within reach   PT Visit Diagnosis: History of falling (Z91.81);Difficulty in walking, not elsewhere classified (R26.2)     Time: 6160-7371 PT Time Calculation (min) (ACUTE ONLY): 17 min  Charges:  $Gait Training: 8-22  mins                    G Codes:      [name], SPT    [name] 02/27/2017, 1:33 PM

## 2017-02-27 NOTE — Progress Notes (Signed)
Pt restless during the night, however, lethargic, answer to name then drift back to sleep, restless at times, sats 96 RA. Will cont to monitor. SRP RN

## 2017-02-27 NOTE — Progress Notes (Signed)
Pt more alert and talking to family, pt noted wheezing and SOB. MD notified.  Stopped IVF per [facility] order.  Will cont to monitor. O2 sat on RA 95%. SRP, RN

## 2017-02-27 NOTE — Progress Notes (Signed)
  PROGRESS NOTE  [name] DSK:[id number] DOB: January 27, 1931 DOA: 02/22/2017 PCP: [name], MD  Brief Narrative: 83 year old woman admitted for small bowel obstruction.  Assessment/Plan Small bowel traction. Felt to be secondary to adhesions, bands from prior laparotomy. -Bowels moving, asymptomatic, imaging shows resolution. Diet advanced. -Likely advance to regular diet 8/18 and discharged home.  Essential hypertension. -Remains stable. Continue amlodipine and labetalol.  Insulin-dependent diabetes mellitus. Hemoglobin A1c 10.4. -Fasting glucose borderline low. Diet advancing. Continue current dosing of Lantus.  Acute urinary retention. -Continue Flomax  DVT prophylaxis: SCDs Code Status: DNR Family Communication: Grandson at bedside Disposition Plan: Home with home health PT, OT  Murray Hodgkins, MD  [facility] Direct contact: [phone] --Via [place]  --[email or web address]; password [email or web address]  7PM-7AM contact night coverage as above 02/27/2017, 5:15 PM  LOS: 5 days   Consultants:  General surgery  Procedures:    Antimicrobials:    Interval history/Subjective: Agitated overnight.  Today feels fine. No abdominal pain. Breathing fine.  Nursing noted some wheezing.  Objective: Vitals: Afebrile, 98.2, 20, 62, 151/53, 99% on room air.  Exam:     Constitutional. Appears calm, comfortable.  Cardiovascular. Regular rate and rhythm. No murmur, rub or gallop. No lower extremity edema.  Respiratory. Clear to auscultation bilaterally. There is some upper airway noise, musical in nature. No stridor. No wheezes, rales or rhonchi. Normal respiratory effort. Speaks in full sentences.  Abdomen. Soft, nontender, nondistended.   I have personally reviewed the following:  Urine output 1600+. Bowels moving. Labs:  Blood sugars stable.  Basic metabolic panel unremarkable except CO2 of 19. Chloride 114.  Imaging studies:  Abdominal x-ray showed  resolution of small bowel obstruction  Scheduled Meds: . amLODipine  10 mg Oral Daily  . docusate sodium  100 mg Oral BID  . insulin aspart  0-5 Units Subcutaneous QHS  . insulin aspart  0-9 Units Subcutaneous TID WC  . insulin glargine  20 Units Subcutaneous QHS  . labetalol  100 mg Oral BID  . mupirocin ointment   Topical BID  . prednisoLONE acetate  1 drop Both Eyes QID  . sodium chloride flush  3 mL Intravenous Q12H  . tamsulosin  0.4 mg Oral QPC supper   Continuous Infusions:   Principal Problem:   SBO (small bowel obstruction) (HCC) Active Problems:   Essential hypertension   Diabetes mellitus type 2 in obese (HCC)   Anxiety   Dizziness   Hypertensive urgency   Leukocytosis   Hyponatremia   Dehydration   Chronic diastolic CHF (congestive heart failure) ([place])   Acute urinary retention   LOS: 5 days

## 2017-02-28 DIAGNOSIS — K56609 Unspecified intestinal obstruction, unspecified as to partial versus complete obstruction: Secondary | ICD-10-CM

## 2017-02-28 LAB — CULTURE, BLOOD (ROUTINE X 2)
CULTURE: NO GROWTH
Culture: NO GROWTH
SPECIAL REQUESTS: ADEQUATE
Special Requests: ADEQUATE

## 2017-02-28 LAB — GLUCOSE, CAPILLARY
GLUCOSE-CAPILLARY: 147 mg/dL — AB (ref 65–99)
Glucose-Capillary: 69 mg/dL (ref 65–99)
Glucose-Capillary: 90 mg/dL (ref 65–99)
Glucose-Capillary: 91 mg/dL (ref 65–99)
Glucose-Capillary: 96 mg/dL (ref 65–99)

## 2017-02-28 MED ORDER — AMLODIPINE BESYLATE 5 MG PO TABS: 10.0000 mg | ORAL_TABLET | Freq: Every day | ORAL | 0 refills | Status: DC

## 2017-02-28 MED ORDER — INSULIN GLARGINE 100 UNIT/ML ~~LOC~~ SOLN: 20.0000 [IU] | Freq: Every day | SUBCUTANEOUS | 0 refills | Status: DC

## 2017-02-28 MED ORDER — DOCUSATE SODIUM 100 MG PO CAPS: 100.0000 mg | ORAL_CAPSULE | Freq: Two times a day (BID) | ORAL | 0 refills | Status: DC

## 2017-02-28 NOTE — Discharge Instructions (Signed)
Soft-Food Meal Plan °A soft-food meal plan includes foods that are safe and easy to swallow. This meal plan typically is used: °· If you are having trouble chewing or swallowing foods. °· As a transition meal plan after only having had liquid meals for a long period. ° °What do I need to know about the soft-food meal plan? °A soft-food meal plan includes tender foods that are soft and easy to chew and swallow. In most cases, bite-sized pieces of food are easier to swallow. A bite-sized piece is about ½ inch or smaller. Foods in this plan do not need to be ground or pureed. °Foods that are very hard, crunchy, or sticky should be avoided. Also, breads, cereals, yogurts, and desserts with nuts, seeds, or fruits should be avoided. °What foods can I eat? °Grains °Rice and wild rice. Moist bread, dressing, pasta, and noodles. Well-moistened dry or cooked cereals, such as farina (cooked wheat cereal), oatmeal, or grits. Biscuits, breads, muffins, pancakes, and waffles that have been well moistened. °Vegetables °Shredded lettuce. Cooked, tender vegetables, including potatoes without skins. Vegetable juices. Broths or creamed soups made with vegetables that are not stringy or chewy. Strained tomatoes (without seeds). °Fruits °Canned or well-cooked fruits. Soft (ripe), peeled fresh fruits, such as peaches, nectarines, kiwi, cantaloupe, honeydew melon, and watermelon (without seeds). Soft berries with small seeds, such as strawberries. Fruit juices (without pulp). °Meats and Other Protein Sources °Moist, tender, lean beef. Mutton. Lamb. Veal. Chicken. Turkey. Liver. Ham. Fish without bones. Eggs. °Dairy °Milk, milk drinks, and cream. Plain cream cheese and cottage cheese. Plain yogurt. °Sweets/Desserts °Flavored gelatin desserts. Custard. Plain ice cream, frozen yogurt, sherbet, milk shakes, and malts. Plain cakes and cookies. Plain hard candy. °Other °Butter, margarine (without trans fat), and cooking oils. Mayonnaise. Cream  sauces. Mild spices, salt, and sugar. Syrup, molasses, honey, and jelly. °The items listed above may not be a complete list of recommended foods or beverages. Contact your dietitian for more options. °What foods are not recommended? °Grains °Dry bread, toast, crackers that have not been moistened. Coarse or dry cereals, such as bran, granola, and shredded wheat. Tough or chewy crusty breads, such as French bread or baguettes. °Vegetables °Corn. Raw vegetables except shredded lettuce. Cooked vegetables that are tough or stringy. Tough, crisp, fried potatoes and potato skins. °Fruits °Fresh fruits with skins or seeds or both, such as apples, pears, or grapes. Stringy, high-pulp fruits, such as papaya, pineapple, coconut, or mango. Fruit leather, fruit roll-ups, and all dried fruits. °Meats and Other Protein Sources °Sausages and hot dogs. Meats with gristle. Fish with bones. Nuts, seeds, and chunky peanut or other nut butters. °Sweets/Desserts °Cakes or cookies that are very dry or chewy. °The items listed above may not be a complete list of foods and beverages to avoid. Contact your dietitian for more information. °This information is not intended to replace advice given to you by your health care provider. Make sure you discuss any questions you have with your health care provider. °Document Released: 10/07/2007 Document Revised: 12/06/2015 Document Reviewed: 05/27/2013 °Elsevier Interactive Patient Education © 2017 [employer] ° °

## 2017-02-28 NOTE — Progress Notes (Signed)
[facility]:  [phone] General Surgery Progress Note   LOS: 6 days  POD -     Chief Complaint: Abdominal pian  Assessment and Plan: 1. SBO  KUB on 02/27/2017 - showed resolution of SB distention  Diet advanced - plans to go home later today.  No reason to follow up with general surgery. 2.  HTN 3.  DM 4.  Chronic CHF 5.  DVT prophylaxis - chemoprophylaxis on hold.   Principal Problem:   SBO (small bowel obstruction) (HCC) Active Problems:   Essential hypertension   Diabetes mellitus type 2 in obese (HCC)   Anxiety   Dizziness   Hypertensive urgency   Leukocytosis   Hyponatremia   Dehydration   Chronic diastolic CHF (congestive heart failure) ([place])   Acute urinary retention   Subjective:  Feeling good.  Ready to go home.  Objective:   Vitals:   02/27/17 2042 02/28/17 0554  BP: (!) 162/100 (!) 168/68  Pulse: 70 67  Resp: 20 18  Temp: 99 F (37.2 C) 98.2 F (36.8 C)  SpO2: 98% 97%     Intake/Output from previous day:  08/17 0701 - 08/18 0700 In: 480 [P.O.:480] Out: 600 [Urine:600]  Intake/Output this shift:  No intake/output data recorded.   Physical Exam:   General: WN older WF who is alert and oriented.    HEENT: Normal. Pupils equal. .   Lungs: Clear   Abdomen: Mild distention. Has BS.  No localized tenderness.   Lab Results:   No results for input(s): WBC, HGB, HCT, PLT in the last 72 hours.  BMET   Recent Labs  02/26/17 0413 02/27/17 0342  NA 139 138  K 3.1* 4.1  CL 112* 114*  CO2 21* 19*  GLUCOSE 94 71  BUN 17 11  CREATININE 0.64 0.66  CALCIUM 7.7* 7.6*    PT/INR  No results for input(s): LABPROT, INR in the last 72 hours.  ABG  No results for input(s): PHART, HCO3 in the last 72 hours.  Invalid input(s): PCO2, PO2   Studies/Results:  Dg Abd 1 View  Result Date: 02/27/2017 CLINICAL DATA:  Followup small bowel obstruction EXAM: ABDOMEN - 1 VIEW COMPARISON:  02/26/2017 FINDINGS: Scattered large and  small bowel gas is noted. Previously seen dilated loops of small bowel have resolved in the interval. No free air is seen. No bony abnormality is noted. Postsurgical changes are again noted. IMPRESSION: Resolution of previously seen small bowel dilatation. Electronically Signed   By: Inez Catalina M.D.   On: 02/27/2017 06:59     Anti-infectives:   Anti-infectives    None      Alphonsa Overall, MD, FACS Pager: [phone] Surgery Office: [phone] 02/28/2017

## 2017-02-28 NOTE — Discharge Summary (Signed)
Physician Discharge Summary  [name] CBJ:[id number] DOB: 19-Dec-1930 DOA: 02/22/2017  PCP: [name], MD  Admit date: 02/22/2017 Discharge date: 02/28/2017  Admitted From: Home Disposition:  Home  Recommendations for Outpatient Follow-up:  1. Follow up with PCP in 1 week 2. Lantus adjusted due to hypoglycemia while improving from SBO. This may need to be re-adjusted as her diet advances. Follow up with PCP.   Home Health: PT OT  RN Aide Equipment/Devices: None   Discharge Condition: Stable CODE STATUS: DNR  Diet recommendation: Soft diet, advance as tolerated to carb modified.   Brief/Interim Summary: From H&P by Dr. Grandville Silos: [name] is a 83 y.o. female with medical history significant of hypertension, diabetes mellitus, history of diverticulitis, status post abdominal hysterectomy, appendectomy, colon surgery, history of colon cancer status post colon surgery 2006, prior history of small bowel obstruction approximately 2 years ago presented to the ED with a several hour history of severe left lower quadrant abdominal pain nonradiating in nature with associated nausea and several bouts of emesis., No dizziness. Patient does endorse some generalized weakness.  ED Course: Patient was seen in the ED CT abdomen and pelvis which was done was concerning for small bowel obstruction. Comprehensive metabolic profile and a sodium of 129 chloride of 94 glucose of 201 BUN of 31 otherwise was within normal limits. CBC had a white count of 21.2 Melinda 15.3 otherwise was within normal limits. Patient noted to have systolic blood pressures in the 200s.  Interim: Gen. surgery was consulted for small bowel obstruction. This was felt to be secondary to adhesions from prior laparotomy. With conservative therapy, patient continued to improve, diet advanced. KUB on 8/17 showed resolution of small bowel distention. She was tolerating diet, having bowel movements and passing gas.  Discharge  Diagnoses:  Principal Problem:   SBO (small bowel obstruction) (HCC) Active Problems:   Essential hypertension   Diabetes mellitus type 2 in obese (HCC)   Anxiety   Dizziness   Hypertensive urgency   Leukocytosis   Hyponatremia   Dehydration   Chronic diastolic CHF (congestive heart failure) ([place])   Acute urinary retention  Small bowel traction. Felt to be secondary to adhesions, bands from prior laparotomy. -General surgery consulted. Conservative management and patient improved. Tolerating soft diet and having BM/gas prior to discharge.   Essential hypertension. -Remains stable. Continue amlodipine and labetalol.  Insulin-dependent diabetes mellitus. Hemoglobin A1c 10.4. -Fasting glucose borderline low. Diet advancing. Continue current dosing of Lantus, dosing was decreased while in hospital. Further adjustments per PCP  Acute urinary retention. -Continue Flomax   Discharge Instructions  Discharge Instructions    Call MD for:  difficulty breathing, headache or visual disturbances    Complete by:  As directed    Call MD for:  extreme fatigue    Complete by:  As directed    Call MD for:  hives    Complete by:  As directed    Call MD for:  persistant dizziness or light-headedness    Complete by:  As directed    Call MD for:  persistant nausea and vomiting    Complete by:  As directed    Call MD for:  severe uncontrolled pain    Complete by:  As directed    Call MD for:  temperature >100.4    Complete by:  As directed    Diet Carb Modified    Complete by:  As directed    SOFT DIET, ADVANCE AS TOLERATED OVER  THE NEXT FEW DAYS   Discharge instructions    Complete by:  As directed    You were cared for by a hospitalist during your hospital stay. If you have any questions about your discharge medications or the care you received while you were in the hospital after you are discharged, you can call the unit and asked to speak with the hospitalist on call if the  hospitalist that took care of you is not available. Once you are discharged, your primary care physician will handle any further medical issues. Please note that NO REFILLS for any discharge medications will be authorized once you are discharged, as it is imperative that you return to your primary care physician (or establish a relationship with a primary care physician if you do not have one) for your aftercare needs so that they can reassess your need for medications and monitor your lab values.  YOUR INSULIN REGIMEN HAS BEEN ADJUSTED DURING HOSPITALIZATION. FOLLOW UP WITH PCP CLOSELY FOR DIABETES MANAGEMENT.   Increase activity slowly    Complete by:  As directed      Allergies as of 02/28/2017      Reactions   Ace Inhibitors Dermatitis   Neosporin [neomycin-bacitracin Zn-polymyx] Dermatitis   Diovan [valsartan] Rash      Medication List    STOP taking these medications   predniSONE 20 MG tablet Commonly known as:  DELTASONE     TAKE these medications   albuterol (2.5 MG/3ML) 0.083% nebulizer solution Commonly known as:  PROVENTIL Take 2.5 mg by nebulization every 4 (four) hours as needed for wheezing or shortness of breath (use every 4 to 6 hrs as needed).   ALPRAZolam 0.5 MG tablet Commonly known as:  XANAX Take 0.5 mg by mouth 3 (three) times daily as needed for anxiety.   amLODipine 5 MG tablet Commonly known as:  NORVASC Take 2 tablets (10 mg total) by mouth daily. What changed:  how much to take   BIOTIN PO Take 1 tablet by mouth daily.   cetirizine 10 MG tablet Commonly known as:  ZYRTEC Take 10 mg by mouth daily.   docusate sodium 100 MG capsule Commonly known as:  COLACE Take 1 capsule (100 mg total) by mouth 2 (two) times daily.   furosemide 40 MG tablet Commonly known as:  LASIX Take 1 tablet (40 mg total) by mouth daily.   insulin aspart 100 UNIT/ML injection Commonly known as:  novoLOG Inject into the skin 3 (three) times daily as needed for high  blood sugar. Per sliding scale instructions from based on blood sugar levels   insulin glargine 100 UNIT/ML injection Commonly known as:  LANTUS Inject 0.2 mLs (20 Units total) into the skin at bedtime. What changed:  how much to take   labetalol 100 MG tablet Commonly known as:  NORMODYNE Take 100 mg by mouth 2 (two) times daily.   multivitamin tablet Take 1 tablet by mouth daily.   multivitamin-lutein Caps capsule Take 1 capsule by mouth daily.   pravastatin 40 MG tablet Commonly known as:  PRAVACHOL Take 40 mg by mouth daily.   prednisoLONE acetate 1 % ophthalmic suspension Commonly known as:  PRED FORTE Place 1 drop into both eyes 4 (four) times daily. Notes to patient:  You have been given 1 dose today   triamcinolone cream 0.1 % Commonly known as:  KENALOG APPLY TWICE DAILY      Follow-up Information    [name], MD. Schedule an appointment as  soon as possible for a visit in 1 week(s).   Specialty:  Internal Medicine Why:  Lantus adjusted due to hypoglycemia while improving from SBO. This may need to be re-adjusted as her diet advances. Follow up with PCP.  Contact information: [place] [address] [phone]          Allergies  Allergen Reactions  . Ace Inhibitors Dermatitis  . Neosporin [Neomycin-Bacitracin Zn-Polymyx] Dermatitis  . Diovan [Valsartan] Rash    Consultations:  General surgery    Procedures/Studies: Dg Chest 2 View  Result Date: 02/22/2017 CLINICAL DATA:  83 year old female with history of lower abdominal pain since this morning. Leukocytosis. EXAM: CHEST  2 VIEW COMPARISON:  Chest x-ray 08/31/2016. FINDINGS: Lung volumes are normal. No consolidative airspace disease. No pleural effusions. No pneumothorax. No pulmonary nodule or mass noted. Pulmonary vasculature and the cardiomediastinal silhouette are within normal limits. IMPRESSION: No radiographic evidence of acute cardiopulmonary disease. Electronically  Signed   By: Vinnie Langton M.D.   On: 02/22/2017 17:07   Dg Abd 1 View  Result Date: 02/27/2017 CLINICAL DATA:  Followup small bowel obstruction EXAM: ABDOMEN - 1 VIEW COMPARISON:  02/26/2017 FINDINGS: Scattered large and small bowel gas is noted. Previously seen dilated loops of small bowel have resolved in the interval. No free air is seen. No bony abnormality is noted. Postsurgical changes are again noted. IMPRESSION: Resolution of previously seen small bowel dilatation. Electronically Signed   By: Inez Catalina M.D.   On: 02/27/2017 06:59   Abd 1 View (kub)  Result Date: 02/23/2017 CLINICAL DATA:  Small bowel obstruction. EXAM: ABDOMEN - 1 VIEW COMPARISON:  CT of the abdomen 02/22/2017 FINDINGS: Gaseous distention of large and small bowel is decreasing. There is gas throughout colon. No obstruction is present. Contrast is present within a mildly distended urinary bladder. IMPRESSION: 1. Decreasing distention of large and small bowel without significant obstruction. 2. Mild distention of the urinary bladder. Electronically Signed   By: San Morelle M.D.   On: 02/23/2017 07:08   Ct Abdomen Pelvis W Contrast  Result Date: 02/22/2017 CLINICAL DATA:  Lower abdominal pain EXAM: CT ABDOMEN AND PELVIS WITH CONTRAST TECHNIQUE: Multidetector CT imaging of the abdomen and pelvis was performed using the standard protocol following bolus administration of intravenous contrast. CONTRAST:  175mL ISOVUE-300 IOPAMIDOL (ISOVUE-300) INJECTION 61% COMPARISON:  06/18/2016 FINDINGS: Lower chest: No acute abnormality. Hepatobiliary: Fatty infiltration of the liver is noted. The gallbladder is within normal limits. Pancreas: Unremarkable. No pancreatic ductal dilatation or surrounding inflammatory changes. Spleen: Normal in size without focal abnormality. Adrenals/Urinary Tract: The adrenal glands are unremarkable. The kidneys demonstrate a normal enhancement pattern bilaterally. No renal calculi or obstructive  changes are seen. The bladder is well distended. Stomach/Bowel: Scattered diverticular change of the colon is noted. No diverticulitis is seen. The appendix has been surgically removed. In the left mid abdomen, there are multiple dilated loops of jejunum identified. A transition zone is noted in the midline anteriorly best seen on image number 78 of series to. This lies just beneath the anterior abdominal wall and likely represents a adhesions. Some fecalization of small bowel material is noted proximal to this. Vascular/Lymphatic: Aortic atherosclerosis. No enlarged abdominal or pelvic lymph nodes. Reproductive: Status post hysterectomy. No adnexal masses. Other: No abdominal wall hernia or abnormality. No abdominopelvic ascites. Musculoskeletal: Degenerative changes of lumbar spine are seen. No acute bony abnormality is noted. IMPRESSION: Multiple dilated loops of jejunum in the left mid abdomen with a focal  transition zone as described. This likely represents an area of adhesions. Diverticulosis without diverticulitis. Chronic changes without acute abnormality. Electronically Signed   By: Inez Catalina M.D.   On: 02/22/2017 15:25   Dg Abd 2 Views  Result Date: 02/26/2017 CLINICAL DATA:  Follow-up small-bowel obstruction. EXAM: ABDOMEN - 2 VIEW COMPARISON:  02/25/2017.  CT 02/22/2017. FINDINGS: Soft tissue structures are unremarkable. Persistent small bowel distention left lower quadrant. Colonic gas pattern is normal. Findings consistent with persistent partial small bowel obstruction. No free air. Surgical clips are noted the pelvis. Degenerative changes lumbar spine and both hips. IMPRESSION: Persistent dilated loops of small bowel in the the left lower quadrant. Colonic gas pattern is normal. Findings consistent with persistent partial small bowel obstruction. Electronically Signed   By: Marcello Moores  Register   On: 02/26/2017 08:25   Dg Abd 2 Views  Result Date: 02/25/2017 CLINICAL DATA:  Small bowel  obstruction. EXAM: ABDOMEN - 2 VIEW COMPARISON:  02/24/2017, 02/23/2017 and CT scan dated 02/22/2017 FINDINGS: There is persistent slight dilatation of multiple small bowel loops in the left lower quadrant as demonstrated on the prior CT scan. There is air scattered throughout the nondistended colon. Distal small bowel is not dilated. IMPRESSION: Persistent dilatation of small bowel loops in the left lower quadrant consistent with partial small-bowel obstruction. Electronically Signed   By: Lorriane Shire M.D.   On: 02/25/2017 09:14   Dg Abd 2 Views  Result Date: 02/24/2017 CLINICAL DATA:  History small bowel obstruction. EXAM: ABDOMEN - 2 VIEW COMPARISON:  Plain films the abdomen 02/23/2017 and CT abdomen and pelvis 02/22/2017. FINDINGS: No free intraperitoneal air is identified. Gaseous distention of loops of small bowel left lower quadrant persists with short air-fluid levels present. IMPRESSION: Persistent dilatation of small bowel loops left lower quadrant of the abdomen consistent with small bowel obstruction. No change since yesterday's exam. Electronically Signed   By: Inge Rise M.D.   On: 02/24/2017 11:32      Discharge Exam: Vitals:   02/27/17 2042 02/28/17 0554  BP: (!) 162/100 (!) 168/68  Pulse: 70 67  Resp: 20 18  Temp: 99 F (37.2 C) 98.2 F (36.8 C)  SpO2: 98% 97%   Vitals:   02/27/17 1500 02/27/17 2042 02/28/17 0500 02/28/17 0554  BP: (!) 151/53 (!) 162/100  (!) 168/68  Pulse: 62 70  67  Resp: 20 20  18   Temp: 98.2 F (36.8 C) 99 F (37.2 C)  98.2 F (36.8 C)  TempSrc: Oral Oral  Oral  SpO2: 99% 98%  97%  Weight:   76.6 kg (168 lb 14.4 oz)   Height:        General: Pt is alert, awake, not in acute distress Cardiovascular: RRR, S1/S2 +, no rubs, no gallops Respiratory: CTA bilaterally, no wheezing, no rhonchi Abdominal: Soft, NT, ND, bowel sounds + Extremities: no edema, no cyanosis    The results of significant diagnostics from this hospitalization  (including imaging, microbiology, ancillary and laboratory) are listed below for reference.     Microbiology: Recent Results (from the past 240 hour(s))  Culture, blood (Routine X 2) w Reflex to ID Panel     Status: None (Preliminary result)   Collection Time: 02/22/17  4:46 PM  Result Value Ref Range Status   Specimen Description BLOOD BLOOD  Final   Special Requests IN PEDIATRIC BOTTLE Blood Culture adequate volume  Final   Culture   Final    NO GROWTH 4 DAYS Performed at [facility], [place] [address], [place], [place] [address]    Report Status PENDING  Incomplete  Culture, blood (Routine X 2) w Reflex to ID Panel     Status: None (Preliminary result)   Collection Time: 02/22/17  4:51 PM  Result Value Ref Range Status   Specimen Description BLOOD BLOOD RIGHT HAND  Final   Special Requests IN PEDIATRIC BOTTLE Blood Culture adequate volume  Final   Culture   Final    NO GROWTH 4 DAYS Performed at [facility], [place] [address], [place], [place] [address]    Report Status PENDING  Incomplete  Culture, Urine     Status: None   Collection Time: 02/23/17 11:15 PM  Result Value Ref Range Status   Specimen Description URINE, RANDOM  Final   Special Requests NONE  Final   Culture   Final    NO GROWTH Performed at [facility], [place] [address] [place], [place] [address]    Report Status 02/25/2017 FINAL  Final  MRSA PCR Screening     Status: None   Collection Time: 02/24/17  8:40 AM  Result Value Ref Range Status   MRSA by PCR NEGATIVE NEGATIVE Final    Comment:        The GeneXpert MRSA Assay (FDA approved for NASAL specimens only), is one component of a comprehensive MRSA colonization surveillance program. It is not intended to diagnose MRSA infection nor to guide or monitor treatment for MRSA infections.      Labs: BNP (last 3 results)  Recent Labs  05/15/16 1404  BNP 762.8*   Basic Metabolic Panel:  Recent Labs Lab 02/23/17 0337  02/23/17 0748 02/24/17 0320 02/25/17 0358 02/26/17 0413 02/27/17 0342  NA 136  --  137 138 139 138  K 4.7  --  4.0 3.6 3.1* 4.1  CL 105  --  107 110 112* 114*  CO2 19*  --  23 21* 21* 19*  GLUCOSE 133*  --  92 71 94 71  BUN 28*  --  25* 21* 17 11  CREATININE 0.90  --  0.79 0.68 0.64 0.66  CALCIUM 8.3*  --  8.3* 7.7* 7.7* 7.6*  MG  --  2.1  --  1.8  --   --    Liver Function Tests:  Recent Labs Lab 02/22/17 1400 02/23/17 0337  AST 18 24  ALT 24 18  ALKPHOS 55 47  BILITOT 1.2 1.0  PROT 7.6 6.4*  ALBUMIN 3.5 3.0*    Recent Labs Lab 02/22/17 1400  LIPASE 22   No results for input(s): AMMONIA in the last 168 hours. CBC:  Recent Labs Lab 02/22/17 1400 02/22/17 1413 02/23/17 0337 02/24/17 0320 02/25/17 0358  WBC 21.2*  --  15.7* 13.1* 8.1  NEUTROABS  --   --   --   --  6.0  HGB 15.3* 15.3* 14.6 13.2 11.5*  HCT 42.6 45.0 41.0 38.8 33.6*  MCV 83.5  --  85.4 85.5 87.0  PLT 264  --  174 208 167   Cardiac Enzymes: No results for input(s): CKTOTAL, CKMB, CKMBINDEX, TROPONINI in the last 168 hours. BNP: Invalid input(s): POCBNP CBG:  Recent Labs Lab 02/28/17 0041 02/28/17 0523 02/28/17 0549 02/28/17 0747 02/28/17 1157  GLUCAP 96 69 90 91 147*   D-Dimer No results for input(s): DDIMER in the last 72 hours. Hgb A1c No results for input(s): HGBA1C in the last 72 hours. Lipid Profile No results for input(s): CHOL,  HDL, LDLCALC, TRIG, CHOLHDL, LDLDIRECT in the last 72 hours. Thyroid function studies No results for input(s): TSH, T4TOTAL, T3FREE, THYROIDAB in the last 72 hours.  Invalid input(s): FREET3 Anemia work up No results for input(s): VITAMINB12, FOLATE, FERRITIN, TIBC, IRON, RETICCTPCT in the last 72 hours. Urinalysis    Component Value Date/Time   COLORURINE YELLOW 02/23/2017 [place] 02/23/2017 2315   LABSPEC 1.026 02/23/2017 2315   PHURINE 5.0 02/23/2017 2315   GLUCOSEU 50 (A) 02/23/2017 2315   HGBUR NEGATIVE 02/23/2017  2315   BILIRUBINUR NEGATIVE 02/23/2017 2315   KETONESUR 5 (A) 02/23/2017 2315   PROTEINUR 30 (A) 02/23/2017 2315   UROBILINOGEN 0.2 08/16/2010 2011   NITRITE NEGATIVE 02/23/2017 2315   LEUKOCYTESUR TRACE (A) 02/23/2017 2315   Sepsis Labs Invalid input(s): PROCALCITONIN,  WBC,  LACTICIDVEN Microbiology Recent Results (from the past 240 hour(s))  Culture, blood (Routine X 2) w Reflex to ID Panel     Status: None (Preliminary result)   Collection Time: 02/22/17  4:46 PM  Result Value Ref Range Status   Specimen Description BLOOD BLOOD  Final   Special Requests IN PEDIATRIC BOTTLE Blood Culture adequate volume  Final   Culture   Final    NO GROWTH 4 DAYS Performed at [facility], [place] [address], [place], [place] [address]    Report Status PENDING  Incomplete  Culture, blood (Routine X 2) w Reflex to ID Panel     Status: None (Preliminary result)   Collection Time: 02/22/17  4:51 PM  Result Value Ref Range Status   Specimen Description BLOOD BLOOD RIGHT HAND  Final   Special Requests IN PEDIATRIC BOTTLE Blood Culture adequate volume  Final   Culture   Final    NO GROWTH 4 DAYS Performed at [facility], [place] [address] [place], [place] [address]    Report Status PENDING  Incomplete  Culture, Urine     Status: None   Collection Time: 02/23/17 11:15 PM  Result Value Ref Range Status   Specimen Description URINE, RANDOM  Final   Special Requests NONE  Final   Culture   Final    NO GROWTH Performed at [facility], [place] [address], [place], [place] [address]    Report Status 02/25/2017 FINAL  Final  MRSA PCR Screening     Status: None   Collection Time: 02/24/17  8:40 AM  Result Value Ref Range Status   MRSA by PCR NEGATIVE NEGATIVE Final    Comment:        The GeneXpert MRSA Assay (FDA approved for NASAL specimens only), is one component of a comprehensive MRSA colonization surveillance program. It is not intended to diagnose MRSA infection nor to guide  or monitor treatment for MRSA infections.      Time coordinating discharge: 40 minutes  SIGNED:  Dessa Phi, DO [facility] Pager [phone]  If 7PM-7AM, please contact night-coverage [email or web address] Password [email or web address] 02/28/2017, 2:00 PM

## 2017-02-28 NOTE — Progress Notes (Signed)
Hypoglycemic Event  CBG: 69  Treatment: OJ  Symptoms: N/A  Follow-up CBG: Time: 0938 CBG Result: 90   Possible Reasons for Event: Full Liquid Diet?  Comments/MD notified:M. [place]

## 2017-03-02 ENCOUNTER — Ambulatory Visit: Payer: Medicare Other | Admitting: Podiatry

## 2017-03-06 DIAGNOSIS — N183 Chronic kidney disease, stage 3 (moderate): Secondary | ICD-10-CM | POA: Diagnosis not present

## 2017-03-06 DIAGNOSIS — E1165 Type 2 diabetes mellitus with hyperglycemia: Secondary | ICD-10-CM | POA: Diagnosis not present

## 2017-03-06 DIAGNOSIS — D509 Iron deficiency anemia, unspecified: Secondary | ICD-10-CM | POA: Diagnosis not present

## 2017-03-06 DIAGNOSIS — I10 Essential (primary) hypertension: Secondary | ICD-10-CM | POA: Diagnosis not present

## 2017-03-14 DIAGNOSIS — J45998 Other asthma: Secondary | ICD-10-CM | POA: Diagnosis not present

## 2017-03-14 DIAGNOSIS — J708 Respiratory conditions due to other specified external agents: Secondary | ICD-10-CM | POA: Diagnosis not present

## 2017-03-25 DIAGNOSIS — E1165 Type 2 diabetes mellitus with hyperglycemia: Secondary | ICD-10-CM | POA: Diagnosis not present

## 2017-03-25 DIAGNOSIS — I10 Essential (primary) hypertension: Secondary | ICD-10-CM | POA: Diagnosis not present

## 2017-03-25 DIAGNOSIS — E113293 Type 2 diabetes mellitus with mild nonproliferative diabetic retinopathy without macular edema, bilateral: Secondary | ICD-10-CM | POA: Diagnosis not present

## 2017-04-13 DIAGNOSIS — J708 Respiratory conditions due to other specified external agents: Secondary | ICD-10-CM | POA: Diagnosis not present

## 2017-04-13 DIAGNOSIS — J45998 Other asthma: Secondary | ICD-10-CM | POA: Diagnosis not present

## 2017-04-30 ENCOUNTER — Emergency Department (HOSPITAL_COMMUNITY): Payer: Medicare Other

## 2017-04-30 ENCOUNTER — Observation Stay (HOSPITAL_COMMUNITY): Payer: Medicare Other

## 2017-04-30 ENCOUNTER — Inpatient Hospital Stay (HOSPITAL_COMMUNITY)
Admission: EM | Admit: 2017-04-30 | Discharge: 2017-05-08 | DRG: 637 | Disposition: A | Discharge status: Skilled Nursing Facility | Payer: Medicare Other | Attending: Internal Medicine | Admitting: Internal Medicine

## 2017-04-30 ENCOUNTER — Encounter (HOSPITAL_COMMUNITY): Payer: Self-pay

## 2017-04-30 DIAGNOSIS — Z85038 Personal history of other malignant neoplasm of large intestine: Secondary | ICD-10-CM

## 2017-04-30 DIAGNOSIS — E11649 Type 2 diabetes mellitus with hypoglycemia without coma: Secondary | ICD-10-CM | POA: Diagnosis not present

## 2017-04-30 DIAGNOSIS — Z794 Long term (current) use of insulin: Secondary | ICD-10-CM | POA: Diagnosis not present

## 2017-04-30 DIAGNOSIS — E785 Hyperlipidemia, unspecified: Secondary | ICD-10-CM | POA: Diagnosis not present

## 2017-04-30 DIAGNOSIS — M79662 Pain in left lower leg: Secondary | ICD-10-CM | POA: Diagnosis not present

## 2017-04-30 DIAGNOSIS — M7120 Synovial cyst of popliteal space [Baker], unspecified knee: Secondary | ICD-10-CM | POA: Diagnosis not present

## 2017-04-30 DIAGNOSIS — J9811 Atelectasis: Secondary | ICD-10-CM | POA: Diagnosis not present

## 2017-04-30 DIAGNOSIS — D6489 Other specified anemias: Secondary | ICD-10-CM | POA: Diagnosis not present

## 2017-04-30 DIAGNOSIS — T68XXXA Hypothermia, initial encounter: Secondary | ICD-10-CM | POA: Diagnosis not present

## 2017-04-30 DIAGNOSIS — R4182 Altered mental status, unspecified: Secondary | ICD-10-CM

## 2017-04-30 DIAGNOSIS — R680 Hypothermia, not associated with low environmental temperature: Secondary | ICD-10-CM | POA: Diagnosis present

## 2017-04-30 DIAGNOSIS — D649 Anemia, unspecified: Secondary | ICD-10-CM

## 2017-04-30 DIAGNOSIS — I5033 Acute on chronic diastolic (congestive) heart failure: Secondary | ICD-10-CM | POA: Diagnosis present

## 2017-04-30 DIAGNOSIS — Z79899 Other long term (current) drug therapy: Secondary | ICD-10-CM

## 2017-04-30 DIAGNOSIS — J9621 Acute and chronic respiratory failure with hypoxia: Secondary | ICD-10-CM | POA: Diagnosis not present

## 2017-04-30 DIAGNOSIS — Z66 Do not resuscitate: Secondary | ICD-10-CM | POA: Diagnosis not present

## 2017-04-30 DIAGNOSIS — E162 Hypoglycemia, unspecified: Secondary | ICD-10-CM

## 2017-04-30 DIAGNOSIS — N390 Urinary tract infection, site not specified: Secondary | ICD-10-CM | POA: Diagnosis present

## 2017-04-30 DIAGNOSIS — R509 Fever, unspecified: Secondary | ICD-10-CM

## 2017-04-30 DIAGNOSIS — I340 Nonrheumatic mitral (valve) insufficiency: Secondary | ICD-10-CM | POA: Diagnosis not present

## 2017-04-30 DIAGNOSIS — I110 Hypertensive heart disease with heart failure: Secondary | ICD-10-CM | POA: Diagnosis present

## 2017-04-30 DIAGNOSIS — B9689 Other specified bacterial agents as the cause of diseases classified elsewhere: Secondary | ICD-10-CM | POA: Diagnosis not present

## 2017-04-30 DIAGNOSIS — R21 Rash and other nonspecific skin eruption: Secondary | ICD-10-CM | POA: Diagnosis not present

## 2017-04-30 DIAGNOSIS — J9600 Acute respiratory failure, unspecified whether with hypoxia or hypercapnia: Secondary | ICD-10-CM | POA: Diagnosis present

## 2017-04-30 DIAGNOSIS — J45909 Unspecified asthma, uncomplicated: Secondary | ICD-10-CM | POA: Diagnosis not present

## 2017-04-30 DIAGNOSIS — R600 Localized edema: Secondary | ICD-10-CM | POA: Diagnosis not present

## 2017-04-30 DIAGNOSIS — M79669 Pain in unspecified lower leg: Secondary | ICD-10-CM

## 2017-04-30 DIAGNOSIS — E876 Hypokalemia: Secondary | ICD-10-CM | POA: Diagnosis not present

## 2017-04-30 DIAGNOSIS — E1151 Type 2 diabetes mellitus with diabetic peripheral angiopathy without gangrene: Secondary | ICD-10-CM | POA: Diagnosis present

## 2017-04-30 DIAGNOSIS — F419 Anxiety disorder, unspecified: Secondary | ICD-10-CM | POA: Diagnosis present

## 2017-04-30 DIAGNOSIS — J9601 Acute respiratory failure with hypoxia: Secondary | ICD-10-CM | POA: Diagnosis present

## 2017-04-30 LAB — CBG MONITORING, ED
GLUCOSE-CAPILLARY: 176 mg/dL — AB (ref 65–99)
GLUCOSE-CAPILLARY: 91 mg/dL (ref 65–99)
GLUCOSE-CAPILLARY: 157 mg/dL — AB (ref 65–99)
GLUCOSE-CAPILLARY: 63 mg/dL — AB (ref 65–99)
GLUCOSE-CAPILLARY: 99 mg/dL (ref 65–99)
Glucose-Capillary: 56 mg/dL — ABNORMAL LOW (ref 65–99)
Glucose-Capillary: 47 mg/dL — ABNORMAL LOW (ref 65–99)

## 2017-04-30 LAB — COMPREHENSIVE METABOLIC PANEL
ALT: 20 U/L (ref 14–54)
ANION GAP: 7 (ref 5–15)
AST: 25 U/L (ref 15–41)
Albumin: 2.9 g/dL — ABNORMAL LOW (ref 3.5–5.0)
Alkaline Phosphatase: 57 U/L (ref 38–126)
BILIRUBIN TOTAL: 0.4 mg/dL (ref 0.3–1.2)
BUN: 23 mg/dL — ABNORMAL HIGH (ref 6–20)
CHLORIDE: 108 mmol/L (ref 101–111)
CO2: 26 mmol/L (ref 22–32)
Calcium: 8.1 mg/dL — ABNORMAL LOW (ref 8.9–10.3)
Creatinine, Ser: 0.84 mg/dL (ref 0.44–1.00)
GFR calc Af Amer: >60 mL/min (ref >60)
Glucose, Bld: 120 mg/dL — ABNORMAL HIGH (ref 65–99)
POTASSIUM: 3.4 mmol/L — AB (ref 3.5–5.1)
Sodium: 141 mmol/L (ref 135–145)
TOTAL PROTEIN: 6.2 g/dL — AB (ref 6.5–8.1)

## 2017-04-30 LAB — URINALYSIS, ROUTINE W REFLEX MICROSCOPIC
Bilirubin Urine: NEGATIVE
GLUCOSE, UA: NEGATIVE mg/dL
Hgb urine dipstick: NEGATIVE
KETONES UR: NEGATIVE mg/dL
NITRITE: POSITIVE — AB
PH: 6 (ref 5.0–8.0)
Protein, ur: 30 mg/dL — AB
Specific Gravity, Urine: 1.015 (ref 1.005–1.030)
Squamous Epithelial / LPF: NONE SEEN

## 2017-04-30 LAB — CBC WITH DIFFERENTIAL/PLATELET
BASOS PCT: 0 %
Basophils Absolute: 0 10*3/uL (ref 0.0–0.1)
Eosinophils Absolute: 0.1 10*3/uL (ref 0.0–0.7)
Eosinophils Relative: 1 %
HEMATOCRIT: 35.3 % — AB (ref 36.0–46.0)
HEMOGLOBIN: 11.6 g/dL — AB (ref 12.0–15.0)
Lymphocytes Relative: 8 %
Lymphs Abs: 0.7 10*3/uL (ref 0.7–4.0)
MCH: 29.5 pg (ref 26.0–34.0)
MCHC: 32.9 g/dL (ref 30.0–36.0)
MCV: 89.8 fL (ref 78.0–100.0)
Monocytes Absolute: 0.6 10*3/uL (ref 0.1–1.0)
Monocytes Relative: 6 %
NEUTROS ABS: 7.6 10*3/uL (ref 1.7–7.7)
Neutrophils Relative %: 85 %
Platelets: 252 10*3/uL (ref 150–400)
RBC: 3.93 MIL/uL (ref 3.87–5.11)
RDW: 14.1 % (ref 11.5–15.5)
WBC: 8.9 10*3/uL (ref 4.0–10.5)

## 2017-04-30 LAB — T4, FREE: FREE T4: 1.06 ng/dL (ref 0.61–1.12)

## 2017-04-30 LAB — I-STAT CG4 LACTIC ACID, ED: LACTIC ACID, VENOUS: 0.92 mmol/L (ref 0.5–1.9)

## 2017-04-30 LAB — TSH: TSH: 0.54 u[IU]/mL (ref 0.350–4.500)

## 2017-04-30 LAB — TROPONIN I

## 2017-04-30 MED ORDER — PRAVASTATIN SODIUM 40 MG PO TABS
40.0000 mg | ORAL_TABLET | Freq: Every day | ORAL | Status: DC
  Administered 2017-04-30 – 2017-05-01 (×2): 40 mg via ORAL
  Filled 2017-04-30 (×2): qty 1

## 2017-04-30 MED ORDER — INSULIN ASPART 100 UNIT/ML ~~LOC~~ SOLN
0.0000 [IU] | Freq: Three times a day (TID) | SUBCUTANEOUS | Status: DC
  Administered 2017-04-30: 2 [IU] via SUBCUTANEOUS
  Filled 2017-04-30: qty 1

## 2017-04-30 MED ORDER — SODIUM CHLORIDE 0.9 % IV BOLUS (SEPSIS)
1000.0000 mL | Freq: Once | INTRAVENOUS | Status: AC
  Administered 2017-04-30: 1000 mL via INTRAVENOUS

## 2017-04-30 MED ORDER — DIPHENHYDRAMINE HCL 25 MG PO CAPS
25.0000 mg | ORAL_CAPSULE | Freq: Once | ORAL | Status: AC
  Administered 2017-04-30: 25 mg via ORAL
  Filled 2017-04-30: qty 1

## 2017-04-30 MED ORDER — POTASSIUM CHLORIDE IN NACL 20-0.9 MEQ/L-% IV SOLN
INTRAVENOUS | Status: AC
  Administered 2017-04-30: 11:00:00 via INTRAVENOUS
  Filled 2017-04-30: qty 1000

## 2017-04-30 MED ORDER — DEXTROSE 5 % IV SOLN
1.0000 g | INTRAVENOUS | Status: DC
  Administered 2017-04-30 – 2017-05-04 (×5): 1 g via INTRAVENOUS
  Filled 2017-04-30 (×5): qty 10

## 2017-04-30 MED ORDER — ACETAMINOPHEN 325 MG PO TABS
650.0000 mg | ORAL_TABLET | Freq: Four times a day (QID) | ORAL | Status: DC | PRN
  Administered 2017-04-30 – 2017-05-04 (×6): 650 mg via ORAL
  Filled 2017-04-30 (×6): qty 2

## 2017-04-30 MED ORDER — DOCUSATE SODIUM 100 MG PO CAPS
100.0000 mg | ORAL_CAPSULE | Freq: Two times a day (BID) | ORAL | Status: DC
  Administered 2017-04-30 – 2017-05-08 (×17): 100 mg via ORAL
  Filled 2017-04-30 (×17): qty 1

## 2017-04-30 MED ORDER — ALBUTEROL SULFATE (2.5 MG/3ML) 0.083% IN NEBU
2.5000 mg | INHALATION_SOLUTION | RESPIRATORY_TRACT | Status: DC | PRN
  Administered 2017-05-02: 2.5 mg via RESPIRATORY_TRACT
  Filled 2017-04-30: qty 3

## 2017-04-30 MED ORDER — IPRATROPIUM-ALBUTEROL 0.5-2.5 (3) MG/3ML IN SOLN
3.0000 mL | Freq: Once | RESPIRATORY_TRACT | Status: AC
  Administered 2017-04-30: 3 mL via RESPIRATORY_TRACT
  Filled 2017-04-30: qty 3

## 2017-04-30 MED ORDER — ACETAMINOPHEN 650 MG RE SUPP: 650.0000 mg | Freq: Four times a day (QID) | RECTAL | Status: DC | PRN

## 2017-04-30 MED ORDER — PREDNISOLONE ACETATE 1 % OP SUSP
1.0000 [drp] | Freq: Four times a day (QID) | OPHTHALMIC | Status: DC
  Administered 2017-05-01 – 2017-05-08 (×28): 1 [drp] via OPHTHALMIC
  Filled 2017-04-30 (×2): qty 1

## 2017-04-30 MED ORDER — ONDANSETRON HCL 4 MG PO TABS
4.0000 mg | ORAL_TABLET | Freq: Four times a day (QID) | ORAL | Status: DC | PRN
  Administered 2017-05-02: 4 mg via ORAL
  Filled 2017-04-30: qty 1

## 2017-04-30 MED ORDER — AMLODIPINE BESYLATE 5 MG PO TABS
5.0000 mg | ORAL_TABLET | Freq: Every day | ORAL | Status: DC
  Administered 2017-04-30 – 2017-05-08 (×9): 5 mg via ORAL
  Filled 2017-04-30 (×9): qty 1

## 2017-04-30 MED ORDER — ALPRAZOLAM 0.5 MG PO TABS
0.5000 mg | ORAL_TABLET | Freq: Three times a day (TID) | ORAL | Status: DC
  Administered 2017-04-30 – 2017-05-08 (×25): 0.5 mg via ORAL
  Filled 2017-04-30 (×25): qty 1

## 2017-04-30 MED ORDER — ENOXAPARIN SODIUM 40 MG/0.4ML ~~LOC~~ SOLN
40.0000 mg | SUBCUTANEOUS | Status: DC
  Administered 2017-04-30 – 2017-05-08 (×9): 40 mg via SUBCUTANEOUS
  Filled 2017-04-30 (×9): qty 0.4

## 2017-04-30 MED ORDER — LABETALOL HCL 200 MG PO TABS
100.0000 mg | ORAL_TABLET | Freq: Two times a day (BID) | ORAL | Status: DC
  Administered 2017-04-30 (×2): 100 mg via ORAL
  Filled 2017-04-30 (×2): qty 1

## 2017-04-30 MED ORDER — ONDANSETRON HCL 4 MG/2ML IJ SOLN: 4.0000 mg | Freq: Four times a day (QID) | INTRAMUSCULAR | Status: DC | PRN

## 2017-04-30 MED ORDER — TRIAMCINOLONE ACETONIDE 0.1 % EX CREA
TOPICAL_CREAM | Freq: Two times a day (BID) | CUTANEOUS | Status: DC
  Administered 2017-05-01 – 2017-05-02 (×3): via TOPICAL
  Administered 2017-05-02: 1 via TOPICAL
  Administered 2017-05-03: 22:00:00 via TOPICAL
  Administered 2017-05-03: 1 via TOPICAL
  Administered 2017-05-04 – 2017-05-08 (×8): via TOPICAL
  Filled 2017-04-30 (×7): qty 15

## 2017-04-30 NOTE — Progress Notes (Signed)
Pharmacy Antibiotic Note  [name] is a 83 y.o. female admitted on 04/30/2017 with UTI.  Pharmacy has been consulted for ROCEPHIN dosing.  Plan: Rocephin 1gm IV q24hrs Monitor labs, progress, c/s  Weight: 168 lb (76.2 kg)  Temp (24hrs), Avg:96.2 F (35.7 C), Min:92.5 F (33.6 C), Max:98.6 F (37 C)   Recent Labs Lab 04/30/17 0637 04/30/17 0706  WBC 8.9  --   CREATININE 0.84  --   LATICACIDVEN  --  0.92    Estimated Creatinine Clearance: 49.8 mL/min (by C-G formula based on SCr of 0.84 mg/dL).    Allergies  Allergen Reactions  . Ace Inhibitors Dermatitis  . Glipizide     rash  . Neosporin [Neomycin-Bacitracin Zn-Polymyx] Dermatitis  . Spironolactone     rash  . Diovan [Valsartan] Rash   Antimicrobials this admission: Rocephin 10/18 >>   Dose adjustments this admission:  Microbiology results:  BCx:   UCx: pending   Sputum:    MRSA PCR:   Thank you for allowing pharmacy to be a part of this patient's care.  Hart Robinsons A 04/30/2017 3:55 PM

## 2017-04-30 NOTE — ED Notes (Signed)
Report given to Tiffany RN in ICU 

## 2017-04-30 NOTE — H&P (Addendum)
History and Physical    [name] UXL:[id number] DOB: 04-Nov-1930 DOA: 04/30/2017  Referring MD/NP/PA: Roxanne Mins PCP: [name], MD   Patient coming from: home  Chief Complaint: unresponsive  HPI: [name] is a 83 y.o. female with history of hypertension, diabetes mellitus type 2, chronic diastolic heart failure, erythroderma desquamativum, colon cancer s/p resection in 2006, arthritis and anxiety who presents with unresponsiveness. She was apparently in her normal state of health except for feeling chilly yesterday according to her daughter and she was normal when she went to bed. This morning, around 4:30am when her daughter was getting ready to leave, she turned on the light and the patient was unresponsive in her bed. She had a little bit of spittle crusted around her mouth and did not open her eyes or respond to touch.  The daughter called EMS.  When EMS checked her blood sugar was 54 and she was given an amp of D50 which caused her blood sugars to rise to the 240s.    In the emergency department, her temperature was 92.5 Fahrenheit, respirations 36, blood pressure 132/52, oxygen saturation 95% on room air. Her labs were notable for normal white blood cell count, mild type hypokalemia that were otherwise at her baseline. Her lactic acid was normal. She was given a liter of warm IV fluids and placed on a bear hugger and observed for several hours however she did not arouse. She appeared to be protecting her airway.she is being admitted for observation for recovery of her hypoglycemia. Her daughter states that her insulin was recently changed from Lantus to [facility] a few days ago and she is currently taking 65 units daily at bedtime. Alternatively, her urinalysis is positive for nitrates, has a mild increase in her work WBC and rare bacteria so she might be developing a urinary tract infection which led to her hypoglycemia. She has been started on ceftriaxone. Chest x-ray was clear.  Review  of Systems:  General:  positive chills, weight change HEENT:  Denies changes to hearing and vision, sinus congestion, sore throat CV:  Denies chest pain, palpitations, lower extremity edema. Positive pain in the left calf PULM:  Denies SOB, cough.   GI:  Denies nausea, vomiting, constipation, diarrhea.   GU:  Denies dysuria, frequency, urgency. Has had urinary incontinence for the last few months ENDO:  Denies polyuria, polydipsia.   HEME:  Denies blood in stools, abnormal bruising or bleeding.  LYMPH:  Denies lymphadenopathy.   MSK:  Denies arthralgias, myalgias.   DERM: chronic skin rash or ulcer.   NEURO:  Denies new focal numbness or weakness, slurred speech, confusion PSYCH:  Chronic anxiety and withdraws from her Xanax if it is discontinued.    Past Medical History:  Diagnosis Date  . Anxiety   . Arthritis   . Cancer [facility]) 2006   colon  . Cataract   . CHF (congestive heart failure) ([place]) 05/2016   preserved EF, grade 2 diastolic dysfunction  . Diabetes mellitus without complication ([place])   . Diverticulitis   . Erythroderma desquamativum   . Hypertension     Past Surgical History:  Procedure Laterality Date  . ABDOMINAL HYSTERECTOMY    . APPENDECTOMY    . COLON SURGERY       reports that she has never smoked. She has never used smokeless tobacco. She reports that she does not drink alcohol or use drugs.  Allergies  Allergen Reactions  . Ace Inhibitors Dermatitis  . Glipizide  rash  . Neosporin [Neomycin-Bacitracin Zn-Polymyx] Dermatitis  . Spironolactone     rash  . Diovan [Valsartan] Rash    Family History  Problem Relation Age of Onset  . Stroke Mother   . Diabetes Father   . Cancer Sister   . Diabetes Sister   . Cancer Brother   . Cancer Brother   . Diabetes Sister   . Stroke Sister     Prior to Admission medications   Medication Sig Start Date End Date Taking? Authorizing Provider  Insulin Glargine (TOUJEO SOLOSTAR [place]) Inject 65 Units into  the skin at bedtime.   Yes [provider]  albuterol (PROVENTIL) (2.5 MG/3ML) 0.083% nebulizer solution Take 2.5 mg by nebulization every 4 (four) hours as needed for wheezing or shortness of breath (use every 4 to 6 hrs as needed).    [provider]  ALPRAZolam Duanne Moron) 0.5 MG tablet Take 0.5 mg by mouth 3 (three) times daily as needed for anxiety.     [provider]  amLODipine (NORVASC) 5 MG tablet Take 2 tablets (10 mg total) by mouth daily. Patient taking differently: Take 5 mg by mouth daily.  02/28/17   Dessa Phi Chahn-Yang, DO  BIOTIN PO Take 1 tablet by mouth daily.    [provider]  cetirizine (ZYRTEC) 10 MG tablet Take 10 mg by mouth daily.    [provider]  docusate sodium (COLACE) 100 MG capsule Take 1 capsule (100 mg total) by mouth 2 (two) times daily. 02/28/17   Dessa Phi Chahn-Yang, DO  furosemide (LASIX) 40 MG tablet Take 1 tablet (40 mg total) by mouth daily. 05/19/16   Isaac Bliss, Rayford Halsted, MD  insulin aspart (NOVOLOG) 100 UNIT/ML injection Inject into the skin 3 (three) times daily as needed for high blood sugar. Per sliding scale instructions from based on blood sugar levels    [provider]  labetalol (NORMODYNE) 100 MG tablet Take 100 mg by mouth 2 (two) times daily.     [provider]  Multiple Vitamin (MULTIVITAMIN) tablet Take 1 tablet by mouth daily.    [provider]  multivitamin-lutein (OCUVITE-LUTEIN) CAPS capsule Take 1 capsule by mouth daily.    [provider]  pravastatin (PRAVACHOL) 40 MG tablet Take 40 mg by mouth daily.    [provider]  prednisoLONE acetate (PRED FORTE) 1 % ophthalmic suspension Place 1 drop into both eyes 4 (four) times daily. 02/09/17   [provider]  triamcinolone cream (KENALOG) 0.1 % APPLY TWICE DAILY 01/13/12   Rise Mu, PA-C    Physical Exam: Vitals:   04/30/17 0601 04/30/17 0610 04/30/17 0619 04/30/17 0700    BP: (!) 132/52  (!) 132/52 (!) 127/56  Pulse:   68 63  Resp:   (!) 36 (!) 28  Temp:  (!) 92.5 F (33.6 C)    TempSrc:  Rectal    SpO2:   95% 96%  Weight: 76.2 kg (168 lb)         Constitutional:  Patient awoke during my exam, NAD, calm, comfortable, visibly shaking under bear hugger  Eyes: PERRL, lids and conjunctivae normal ENMT:  Moist mucous membranes.  Oropharynx nonerythematous, no exudates.   Neck:  No nuchal rigidity, no masses Respiratory:  Wheezing, no rales, or rhonchi Cardiovascular: Regular rate and rhythm, no murmurs / rubs / gallops.  2+ radial pulses. Abdomen:  Normal active bowel sounds, soft, moderately distended, nontender Musculoskeletal: Normal muscle tone and bulk.  No contractures.  Skin:  Erythematous macular desquamating rash on arms, legs, trunk with ulcerated areas particularly on legs.  No obvious other abrasions Neurologic:  CN 2-12 grossly intact. Sensation intact to light touch, strength 4/5 throughout Psychiatric:  More alert but still somewhat confused, but mentation improving even during my visit.  HOH.    Labs on Admission: I have personally reviewed following labs and imaging studies  CBC:  Recent Labs Lab 04/30/17 0637  WBC 8.9  NEUTROABS 7.6  HGB 11.6*  HCT 35.3*  MCV 89.8  PLT 938   Basic Metabolic Panel:  Recent Labs Lab 04/30/17 0637  NA 141  K 3.4*  CL 108  CO2 26  GLUCOSE 120*  BUN 23*  CREATININE 0.84  CALCIUM 8.1*   GFR: Estimated Creatinine Clearance: 49.8 mL/min (by C-G formula based on SCr of 0.84 mg/dL). Liver Function Tests:  Recent Labs Lab 04/30/17 0637  AST 25  ALT 20  ALKPHOS 57  BILITOT 0.4  PROT 6.2*  ALBUMIN 2.9*   No results for input(s): LIPASE, AMYLASE in the last 168 hours. No results for input(s): AMMONIA in the last 168 hours. Coagulation Profile: No results for input(s): INR, PROTIME in the last 168 hours. Cardiac Enzymes:  Recent Labs Lab 04/30/17 0637  TROPONINI <0.03   BNP  (last 3 results) No results for input(s): PROBNP in the last 8760 hours. HbA1C: No results for input(s): HGBA1C in the last 72 hours. CBG:  Recent Labs Lab 04/30/17 0601  GLUCAP 176*   Lipid Profile: No results for input(s): CHOL, HDL, LDLCALC, TRIG, CHOLHDL, LDLDIRECT in the last 72 hours. Thyroid Function Tests: No results for input(s): TSH, T4TOTAL, FREET4, T3FREE, THYROIDAB in the last 72 hours. Anemia Panel: No results for input(s): VITAMINB12, FOLATE, FERRITIN, TIBC, IRON, RETICCTPCT in the last 72 hours. Urine analysis:    Component Value Date/Time   COLORURINE YELLOW 04/30/2017 0610   APPEARANCEUR HAZY (A) 04/30/2017 0610   LABSPEC 1.015 04/30/2017 0610   PHURINE 6.0 04/30/2017 0610   GLUCOSEU NEGATIVE 04/30/2017 0610   HGBUR NEGATIVE 04/30/2017 0610   BILIRUBINUR NEGATIVE 04/30/2017 0610   KETONESUR NEGATIVE 04/30/2017 0610   PROTEINUR 30 (A) 04/30/2017 0610   UROBILINOGEN 0.2 08/16/2010 2011   NITRITE POSITIVE (A) 04/30/2017 0610   LEUKOCYTESUR MODERATE (A) 04/30/2017 0610   Sepsis Labs: @LABRCNTIP (procalcitonin:4,lacticidven:4) ) Recent Results (from the past 240 hour(s))  Culture, blood (routine x 2)     Status: None (Preliminary result)   Collection Time: 04/30/17  6:38 AM  Result Value Ref Range Status   Specimen Description BLOOD RIGHT ARM  Final   Special Requests   Final    BOTTLES DRAWN AEROBIC AND ANAEROBIC Blood Culture adequate volume   Culture PENDING  Incomplete   Report Status PENDING  Incomplete  Culture, blood (routine x 2)     Status: None (Preliminary result)   Collection Time: 04/30/17  6:50 AM  Result Value Ref Range Status   Specimen Description BLOOD RIGHT HAND  Final   Special Requests   Final    BOTTLES DRAWN AEROBIC ONLY Blood Culture adequate volume   Culture PENDING  Incomplete   Report Status PENDING  Incomplete     Radiological Exams on Admission: Dg Chest Port 1 View  Result Date: 04/30/2017 CLINICAL DATA:   Hypothermia. History of colon cancer, CHF, diabetes, hypertension. Nonsmoker. EXAM: PORTABLE CHEST 1 VIEW COMPARISON:  02/11/2017 FINDINGS: Shallow inspiration. Cardiac enlargement with pulmonary vascular congestion. Infiltration in the lung bases may represent early edema.  No consolidation. No pleural effusions. No pneumothorax. Degenerative changes in the spine and shoulders. IMPRESSION: New cardiac enlargement, pulmonary vascular congestion, and mild basilar edema. Electronically Signed   By: Lucienne Capers M.D.   On: 04/30/2017 06:31    EKG: Independently reviewed. pending  Assessment/Plan Active Problems:   Hypoglycemia   Hypoglycemia may have been due to change in her insulin over the last few days, alternatively she may be developing a urinary tract infection -  Every 2 hours CBGs -  Low-dose sliding scale insulin -  Advance diet -  Will start dextrose-containing fluids if needed, but currently holding off she appears to be waking up -  TSH wnl  Possible urinary tract infection -  Add on urine culture -  Ceftriaxone  Hypothermia, likely secondary to hypoglycemia -  Continue Bear hugger until her temperature is normal -  Doubt sepsis, but treating with antibiotics anyway -  Follow-up blood cultures  Left calf pain -  Check lower extremity duplex  Chronic diastolic heart failure, without evidence of 5 overload, BUN somewhat elevated -  Hold diuretics -  Additional 1 L IV fluids -  BMP in a.m.  Hypertension, stable -  Continue labetalol and norvasc  Asthma, with wheezing and tachypnea -  duoneb now and prn albuterol -  CXR without infiltrate  Anxiety, stable, continue xanax  HLD, chronic stable, continue pravastatin  Rash, chronic, continue steroid creams  DVT prophylaxis: lovenox  Code Status: DNR Family Communication:  Patient and daughter  Disposition Plan: likely home tomorrow if CBG and temperature stable   Consults called: none  Admission status:  observation, stepdown   Janece Canterbury MD [facility] Pager [phone]  If 7PM-7AM, please contact night-coverage [email or web address] Password [email or web address]  04/30/2017, 8:27 AM

## 2017-04-30 NOTE — ED Notes (Signed)
Pt CBG-47. Pt alert and oriented with no complaints. Meal tray given.

## 2017-04-30 NOTE — ED Notes (Signed)
Per pt granddaughter pt was switched from 65u lantus to 65u trujeo at night a few nights ago. Granddaughter is concerned that the new insulin may have caused the hypoglycemic episode.

## 2017-04-30 NOTE — ED Notes (Signed)
Pt granddaughter [name] [phone].

## 2017-04-30 NOTE — ED Triage Notes (Signed)
Pt was found on sofa unresponsive. EMS checked blood sugar was 54. Amp D50 was given blood sugar up in the 240's

## 2017-04-30 NOTE — ED Notes (Addendum)
BS 176

## 2017-04-30 NOTE — ED Notes (Signed)
CBG-56. Pt alert. OJ and breakfast tray given.

## 2017-04-30 NOTE — ED Provider Notes (Signed)
[facility] EMERGENCY DEPARTMENT Provider Note   CSN: [id number] Arrival date & time: 04/30/17  5397     History   Chief Complaint No chief complaint on file.   HPI [name] is a 83 y.o. female.  The history is provided by the EMS personnel. The history is limited by the condition of the patient (altered mental status).  She was brought in by EMS from home where she was noted to have decreased level of consciousness. EMS reports capillary blood glucose of 54 and gave her an amp of D50 with no change in mental state.  Past Medical History:  Diagnosis Date  . Anxiety   . Arthritis   . Cancer [facility]) 2006   colon  . Cataract   . CHF (congestive heart failure) ([place]) 05/2016   preserved EF, grade 2 diastolic dysfunction  . Diabetes mellitus without complication ([place])   . Diverticulitis   . Erythroderma desquamativum   . Hypertension     Patient Active Problem List   Diagnosis Date Noted  . Acute urinary retention 02/23/2017  . Chronic diastolic CHF (congestive heart failure) ([place])   . SBO (small bowel obstruction) ([place]) 02/22/2017  . Hypertensive urgency 02/22/2017  . Leukocytosis 02/22/2017  . Hyponatremia 02/22/2017  . Dehydration 02/22/2017  . Dizziness 09/01/2016  . Weakness 09/01/2016  . Psoriasis vulgaris 09/01/2016  . Right hip pain 09/01/2016  . Diabetic foot ulcer ([place]) 09/01/2016  . Dyspnea 05/15/2016  . Congestive heart failure (CHF) ([place]) 05/15/2016  . Rash and nonspecific skin eruption 05/15/2016  . Essential hypertension 05/15/2016  . Diabetes mellitus type 2 in obese ([place]) 05/15/2016  . Anxiety 05/15/2016  . Acute respiratory failure with hypoxia ([place]) 05/15/2016    Past Surgical History:  Procedure Laterality Date  . ABDOMINAL HYSTERECTOMY    . APPENDECTOMY    . COLON SURGERY      OB History    No data available       Home Medications    Prior to Admission medications   Medication Sig Start Date End Date Taking? Authorizing  Provider  albuterol (PROVENTIL) (2.5 MG/3ML) 0.083% nebulizer solution Take 2.5 mg by nebulization every 4 (four) hours as needed for wheezing or shortness of breath (use every 4 to 6 hrs as needed).    [provider]  ALPRAZolam Duanne Moron) 0.5 MG tablet Take 0.5 mg by mouth 3 (three) times daily as needed for anxiety.     [provider]  amLODipine (NORVASC) 5 MG tablet Take 2 tablets (10 mg total) by mouth daily. 02/28/17   Dessa Phi Chahn-Yang, DO  BIOTIN PO Take 1 tablet by mouth daily.    [provider]  cetirizine (ZYRTEC) 10 MG tablet Take 10 mg by mouth daily.    [provider]  docusate sodium (COLACE) 100 MG capsule Take 1 capsule (100 mg total) by mouth 2 (two) times daily. 02/28/17   Dessa Phi Chahn-Yang, DO  furosemide (LASIX) 40 MG tablet Take 1 tablet (40 mg total) by mouth daily. 05/19/16   Isaac Bliss, Rayford Halsted, MD  insulin aspart (NOVOLOG) 100 UNIT/ML injection Inject into the skin 3 (three) times daily as needed for high blood sugar. Per sliding scale instructions from based on blood sugar levels    [provider]  insulin glargine (LANTUS) 100 UNIT/ML injection Inject 0.2 mLs (20 Units total) into the skin at bedtime. 02/28/17   Dessa Phi Chahn-Yang, DO  labetalol (NORMODYNE) 100 MG tablet Take 100 mg by  mouth 2 (two) times daily.     [provider]  Multiple Vitamin (MULTIVITAMIN) tablet Take 1 tablet by mouth daily.    [provider]  multivitamin-lutein (OCUVITE-LUTEIN) CAPS capsule Take 1 capsule by mouth daily.    [provider]  pravastatin (PRAVACHOL) 40 MG tablet Take 40 mg by mouth daily.    [provider]  prednisoLONE acetate (PRED FORTE) 1 % ophthalmic suspension Place 1 drop into both eyes 4 (four) times daily. 02/09/17   [provider]  triamcinolone cream (KENALOG) 0.1 % APPLY TWICE DAILY 01/13/12   Rise Mu, PA-C    Family History Family History    Problem Relation Age of Onset  . Stroke Mother   . Diabetes Father   . Cancer Sister   . Diabetes Sister   . Cancer Brother   . Cancer Brother   . Diabetes Sister   . Stroke Sister     Social History Social History  Substance Use Topics  . Smoking status: Never Smoker  . Smokeless tobacco: Never Used  . Alcohol use No     Allergies   Ace inhibitors; Neosporin [neomycin-bacitracin zn-polymyx]; and Diovan [valsartan]   Review of Systems Review of Systems  All other systems reviewed and are negative.    Physical Exam Updated Vital Signs BP (!) 127/56   Pulse 63   Temp (!) 92.5 F (33.6 C) (Rectal)   Resp (!) 28   Wt 76.2 kg (168 lb)   SpO2 96%   BMI 28.84 kg/m   Physical Exam  Nursing note and vitals reviewed.  83 year old female, resting comfortably and in no acute distress. Vital signs are significant for hypothermia. Oxygen saturation is 96%, which is normal. Head is normocephalic and atraumatic. PERRLA, EOMI. Oropharynx is clear. Neck is nontender and supple without adenopathy or JVD. Back is nontender and there is no CVA tenderness. Lungs are clear without rales, wheezes, or rhonchi. Chest is nontender. Heart has regular rate and rhythm without murmur. Abdomen is soft, flat, nontender without masses or hepatosplenomegaly and peristalsis is normoactive. Extremities have no cyanosis or edema, full range of motion is present. Skin has diffuse rash with thickened skin and erythematous to violaceous coloring. Neurologic: she is awake but nonverbal, cranial nerves are intact, there are no motor or sensory deficits.  ED Treatments / Results  Labs (all labs ordered are listed, but only abnormal results are displayed) Labs Reviewed  CBC WITH DIFFERENTIAL/PLATELET - Abnormal; Notable for the following:       Result Value   Hemoglobin 11.6 (*)    HCT 35.3 (*)    All other components within normal limits  URINALYSIS, ROUTINE W REFLEX MICROSCOPIC - Abnormal;  Notable for the following:    APPearance HAZY (*)    Protein, ur 30 (*)    Nitrite POSITIVE (*)    Leukocytes, UA MODERATE (*)    Bacteria, UA RARE (*)    All other components within normal limits  COMPREHENSIVE METABOLIC PANEL - Abnormal; Notable for the following:    Potassium 3.4 (*)    Glucose, Bld 120 (*)    BUN 23 (*)    Calcium 8.1 (*)    Total Protein 6.2 (*)    Albumin 2.9 (*)    All other components within normal limits  CBG MONITORING, ED - Abnormal; Notable for the following:    Glucose-Capillary 176 (*)    All other components within normal limits  CULTURE, BLOOD (ROUTINE X  2)  CULTURE, BLOOD (ROUTINE X 2)  URINE CULTURE  TROPONIN I  TSH  T4, FREE  I-STAT CG4 LACTIC ACID, ED    EKG - ordered, but not completed as of this note  Radiology Dg Chest Port 1 View  Result Date: 04/30/2017 CLINICAL DATA:  Hypothermia. History of colon cancer, CHF, diabetes, hypertension. Nonsmoker. EXAM: PORTABLE CHEST 1 VIEW COMPARISON:  02/11/2017 FINDINGS: Shallow inspiration. Cardiac enlargement with pulmonary vascular congestion. Infiltration in the lung bases may represent early edema. No consolidation. No pleural effusions. No pneumothorax. Degenerative changes in the spine and shoulders. IMPRESSION: New cardiac enlargement, pulmonary vascular congestion, and mild basilar edema. Electronically Signed   By: Lucienne Capers M.D.   On: 04/30/2017 06:31    Procedures Procedures (including critical care time) CRITICAL CARE Performed by: UKGUR,KYHCW Total critical care time: 45 minutes Critical care time was exclusive of separately billable procedures and treating other patients. Critical care was necessary to treat or prevent imminent or life-threatening deterioration. Critical care was time spent personally by me on the following activities: development of treatment plan with patient and/or surrogate as well as nursing, discussions with consultants, evaluation of patient's response  to treatment, examination of patient, obtaining history from patient or surrogate, ordering and performing treatments and interventions, ordering and review of laboratory studies, ordering and review of radiographic studies, pulse oximetry and re-evaluation of patient's condition.  Medications Ordered in ED Medications  cefTRIAXone (ROCEPHIN) 1 g in dextrose 5 % 50 mL IVPB (not administered)  sodium chloride 0.9 % bolus 1,000 mL (0 mLs Intravenous Stopped 04/30/17 0713)     Initial Impression / Assessment and Plan / ED Course  I have reviewed the triage vital signs and the nursing notes.  Pertinent labs & imaging results that were available during my care of the patient were reviewed by me and considered in my medical decision making (see chart for details).  Hypothermia of uncertain cause. Will begin septic workup. She is placed on an external warmer and given warmed fluids Altered mental status is more likely to be secondary to hyperthermia than the relatively mild hypoglycemia she experienced.  Septic workup is unremarkable. Lactic acid level is normal. No evidence of pneumonia or urinary tract infection. Hemoglobin is stable. Renal function shows slight increase in BUN over baseline with no change in creatinine. Chronic anemia is unchanged from baseline. Family has arrived and state that she was normal last night when they got home at 8:30 PM. She was walking and talking. Patient is still somnolent but arousable but without any intelligible speech. Will send for CT of head, although there is no history of trauma. Also, family states that she has been keeping the house very warm and still has been cold. Will check thyroid functions. Case is discussed with Dr. Sheran Fava of [facility] who agrees to admit the patient.  Final Clinical Impressions(s) / ED Diagnoses   Final diagnoses:  Altered mental status, unspecified altered mental status type  Hypothermia, initial encounter  Hypoglycemia    Normochromic normocytic anemia    New Prescriptions New Prescriptions   No medications on file     Delora Fuel, MD 23/76/28 [phone]

## 2017-05-01 DIAGNOSIS — D649 Anemia, unspecified: Secondary | ICD-10-CM

## 2017-05-01 LAB — CBC
HCT: 34.7 % — ABNORMAL LOW (ref 36.0–46.0)
HEMOGLOBIN: 11.3 g/dL — AB (ref 12.0–15.0)
MCH: 29.7 pg (ref 26.0–34.0)
MCHC: 32.6 g/dL (ref 30.0–36.0)
MCV: 91.1 fL (ref 78.0–100.0)
Platelets: 246 10*3/uL (ref 150–400)
RBC: 3.81 MIL/uL — AB (ref 3.87–5.11)
RDW: 14.5 % (ref 11.5–15.5)
WBC: 8 10*3/uL (ref 4.0–10.5)

## 2017-05-01 LAB — GLUCOSE, CAPILLARY
GLUCOSE-CAPILLARY: 112 mg/dL — AB (ref 65–99)
GLUCOSE-CAPILLARY: 113 mg/dL — AB (ref 65–99)
GLUCOSE-CAPILLARY: 128 mg/dL — AB (ref 65–99)
GLUCOSE-CAPILLARY: 130 mg/dL — AB (ref 65–99)
GLUCOSE-CAPILLARY: 179 mg/dL — AB (ref 65–99)
GLUCOSE-CAPILLARY: 26 mg/dL — AB (ref 65–99)
GLUCOSE-CAPILLARY: 34 mg/dL — AB (ref 65–99)
GLUCOSE-CAPILLARY: 71 mg/dL (ref 65–99)
Glucose-Capillary: 47 mg/dL — ABNORMAL LOW (ref 65–99)
Glucose-Capillary: 159 mg/dL — ABNORMAL HIGH (ref 65–99)
Glucose-Capillary: 70 mg/dL (ref 65–99)
Glucose-Capillary: 128 mg/dL — ABNORMAL HIGH (ref 65–99)
Glucose-Capillary: 150 mg/dL — ABNORMAL HIGH (ref 65–99)

## 2017-05-01 LAB — BASIC METABOLIC PANEL
ANION GAP: 9 (ref 5–15)
BUN: 20 mg/dL (ref 6–20)
CALCIUM: 8.3 mg/dL — AB (ref 8.9–10.3)
CHLORIDE: 110 mmol/L (ref 101–111)
CO2: 21 mmol/L — AB (ref 22–32)
Creatinine, Ser: 0.92 mg/dL (ref 0.44–1.00)
GFR calc non Af Amer: 56 mL/min — ABNORMAL LOW (ref >60)
Glucose, Bld: 78 mg/dL (ref 65–99)
Potassium: 4.1 mmol/L (ref 3.5–5.1)
SODIUM: 140 mmol/L (ref 135–145)

## 2017-05-01 LAB — MRSA PCR SCREENING: MRSA by PCR: NEGATIVE

## 2017-05-01 MED ORDER — IBUPROFEN 400 MG PO TABS
400.0000 mg | ORAL_TABLET | Freq: Three times a day (TID) | ORAL | Status: DC | PRN
  Administered 2017-05-01 – 2017-05-04 (×3): 400 mg via ORAL
  Filled 2017-05-01 (×4): qty 1

## 2017-05-01 MED ORDER — GLUCAGON HCL RDNA (DIAGNOSTIC) 1 MG IJ SOLR: 1.0000 mg | Freq: Once | INTRAMUSCULAR | Status: DC | PRN

## 2017-05-01 MED ORDER — FUROSEMIDE 40 MG PO TABS
40.0000 mg | ORAL_TABLET | Freq: Every day | ORAL | Status: DC
  Administered 2017-05-01 – 2017-05-03 (×3): 40 mg via ORAL
  Filled 2017-05-01 (×3): qty 1

## 2017-05-01 MED ORDER — OXYCODONE-ACETAMINOPHEN 5-325 MG PO TABS: 1.0000 | ORAL_TABLET | Freq: Once | ORAL | Status: DC

## 2017-05-01 MED ORDER — DEXTROSE 50 % IV SOLN
INTRAVENOUS | Status: AC
Start: 2017-05-01 — End: 2017-05-01
  Filled 2017-05-01: qty 50

## 2017-05-01 MED ORDER — DEXTROSE 10 % IV SOLN
INTRAVENOUS | Status: DC
  Administered 2017-05-01 – 2017-05-02 (×3): via INTRAVENOUS
  Administered 2017-05-02: 1 mL via INTRAVENOUS
  Administered 2017-05-03: 05:00:00 via INTRAVENOUS

## 2017-05-01 MED ORDER — OXYCODONE-ACETAMINOPHEN 5-325 MG PO TABS
2.0000 | ORAL_TABLET | Freq: Once | ORAL | Status: DC
  Filled 2017-05-01: qty 2

## 2017-05-01 MED ORDER — DEXTROSE 50 % IV SOLN
1.0000 | Freq: Once | INTRAVENOUS | Status: AC
  Administered 2017-05-01: 50 mL via INTRAVENOUS

## 2017-05-01 MED ORDER — GLUCAGON HCL RDNA (DIAGNOSTIC) 1 MG IJ SOLR
1.0000 mg | Freq: Once | INTRAMUSCULAR | Status: AC
  Administered 2017-05-01: 1 mg via INTRAVENOUS
  Filled 2017-05-01: qty 1

## 2017-05-01 MED ORDER — DEXTROSE 50 % IV SOLN
INTRAVENOUS | Status: AC
  Administered 2017-05-01: 50 mL
  Filled 2017-05-01: qty 50

## 2017-05-01 NOTE — Care Management Obs Status (Signed)
[place] NOTIFICATION   Patient Details  Name: [name] MRN: [id number] Date of Birth: 1930/08/31   Medicare Observation Status Notification Given:  Yes    [name], Chauncey Reading, RN 05/01/2017, 3:16 PM

## 2017-05-01 NOTE — Care Management Note (Signed)
Case Management Note  Patient Details  Name: [name] MRN: [id number] Date of Birth: [birth year]  Subjective/Objective:   Adm with hypoglycemia. From home, reports family is "in and out". She walks with a cane, does not like RW's. She plans to DC back home. Declines need for HH. Reports that she has had HH and feels that it "is a waste of money".                 Action/Plan: Anticipate DC home with self care. No CM needs communicated.    Expected Discharge Date:     05/02/2017             Expected Discharge Plan:  Home/Self Care  In-House Referral:     Discharge planning Services  CM Consult  Post Acute Care Choice:    Choice offered to:     DME Arranged:    DME Agency:     HH Arranged:    HH Agency:     Status of Service:  In process, will continue to follow  If discussed at Long Length of Stay Meetings, dates discussed:    Additional Comments:  Demara Lover, Chauncey Reading, RN 05/01/2017, 3:19 PM

## 2017-05-01 NOTE — Progress Notes (Signed)
Hypoglycemic Event  CBG: 34   Treatment: D50 IV 25 mL  Symptoms: None  Follow-up CBG: Time: 0829 CBG Result: 179  Possible Reasons for Event: Medication regimen: Toujeo  Comments/MD notified: Yes, Dr.Short aware    Ronit Marczak L

## 2017-05-01 NOTE — Progress Notes (Signed)
Hypoglycemic Event  CBG: 26  Treatment: orange juice and an amp of D50  Symptoms: non-symptomatic  Follow-up CBG: Time:0025 CBG Result:112  Possible Reasons for Event:Pt admitted tonight for Hypoglycemia  Comments/MD notified:Dr. Schorr, D10% infusion at 25 ml/hr ordered.    [name]

## 2017-05-01 NOTE — Progress Notes (Signed)
PROGRESS NOTE  [name]  ZWC:[id number] DOB: 1931/03/25 DOA: 04/30/2017 PCP: [name], MD  Brief Narrative:   [name] is a 83 y.o. female with history of hypertension, diabetes mellitus type 2, chronic diastolic heart failure, erythroderma desquamativum, colon cancer s/p resection in 2006, arthritis and anxiety who presented with unresponsiveness.  She was found to be hypoglycemic and was transferred to the ER where she was hypothermic.  She recently changed to [facility] and she also has a UTI.  She continues to have episodes of hypoglycemia today.  Increasing D10 and giving glucacon.  Continue q2h CBG until CBGs have stabilized.  Half life of toujeo is > 24 hours so may take several days before she stabilizes.    Assessment & Plan:   Active Problems:   Hypoglycemia  Hypoglycemia may have been due to change in her insulin over the last few days, alternatively she may be developing a urinary tract infection -  Every 2 hours CBGs -  Low-dose sliding scale insulin -  Advance diet -  Will start dextrose-containing fluids if needed, but currently holding off she appears to be waking up -  TSH wnl  Urinary tract infection -  Urine culture:  > 100000 GNR -  Continue Ceftriaxone  Hypothermia, likely secondary to hypoglycemia and resolved -  Blood cultures NGTD  Left calf pain:  -  Lower extremity duplex:  Peripheral vascular disease and Baker's cyst  Chronic diastolic heart failure, with some wheezing today -  resume lasix   Hypertension, stable -  Continue labetalol and norvasc  Asthma, with wheezing and tachypnea -  duoneb now and prn albuterol -  CXR without infiltrate  Anxiety, stable, continue xanax  HLD, chronic stable, continue pravastatin  Rash, chronic, continue steroid creams  DVT prophylaxis: lovenox  Code Status: DNR Family Communication:  Patient and daughter  Disposition Plan:  home once CBG stabilize  Consultants:    none  Procedures:  none  Antimicrobials:  Anti-infectives    Start     Dose/Rate Route Frequency Ordered Stop   04/30/17 0800  cefTRIAXone (ROCEPHIN) 1 g in dextrose 5 % 50 mL IVPB     1 g 100 mL/hr over 30 Minutes Intravenous Every 24 hours 04/30/17 0743         Subjective:  Feeling a little SOB, but otherwise okay.  Denies shakiness, jitters, lightheadedness.    Objective: Vitals:   05/01/17 0100 05/01/17 0200 05/01/17 0400 05/01/17 1134  BP: 130/61 (!) 130/53    Pulse: 72 74    Resp: 20 (!) 21    Temp:   98.2 F (36.8 C) 98.2 F (36.8 C)  TempSrc:   Oral Oral  SpO2: 97% 90%    Weight:      Height:        Intake/Output Summary (Last 24 hours) at 05/01/17 1638 Last data filed at 05/01/17 1344  Gross per 24 hour  Intake              120 ml  Output              900 ml  Net             -780 ml   Filed Weights   04/30/17 0601 04/30/17 2019  Weight: 76.2 kg (168 lb) 81.6 kg (179 lb 14.3 oz)    Examination:  General exam:  Adult female.  No acute distress.  HEENT:  NCAT, MMM Respiratory system: wheezing, no focal  rales or rhonchi Cardiovascular system: Regular rate and rhythm, normal S1/S2. No murmurs, rubs, gallops or clicks.  Warm extremities Gastrointestinal system: Normal active bowel sounds, soft, nondistended, nontender. MSK:  Normal tone and bulk, 1+ pitting bilateral lower extremity edema, pain behind the right knee with some bulging Neuro:  Grossly intact Derm:  Erythematous desquamating macular rash on trunk, arms, and legs    Data Reviewed: I have personally reviewed following labs and imaging studies  CBC:  Recent Labs Lab 04/30/17 0637 05/01/17 0543  WBC 8.9 8.0  NEUTROABS 7.6  --   HGB 11.6* 11.3*  HCT 35.3* 34.7*  MCV 89.8 91.1  PLT 252 885   Basic Metabolic Panel:  Recent Labs Lab 04/30/17 0637 05/01/17 0543  NA 141 140  K 3.4* 4.1  CL 108 110  CO2 26 21*  GLUCOSE 120* 78  BUN 23* 20  CREATININE 0.84 0.92  CALCIUM  8.1* 8.3*   GFR: Estimated Creatinine Clearance: 47.1 mL/min (by C-G formula based on SCr of 0.92 mg/dL). Liver Function Tests:  Recent Labs Lab 04/30/17 0637  AST 25  ALT 20  ALKPHOS 57  BILITOT 0.4  PROT 6.2*  ALBUMIN 2.9*   No results for input(s): LIPASE, AMYLASE in the last 168 hours. No results for input(s): AMMONIA in the last 168 hours. Coagulation Profile: No results for input(s): INR, PROTIME in the last 168 hours. Cardiac Enzymes:  Recent Labs Lab 04/30/17 0637  TROPONINI <0.03   BNP (last 3 results) No results for input(s): PROBNP in the last 8760 hours. HbA1C: No results for input(s): HGBA1C in the last 72 hours. CBG:  Recent Labs Lab 05/01/17 0404 05/01/17 0557 05/01/17 0737 05/01/17 0829 05/01/17 1103  GLUCAP 70 71 34* 179* 128*   Lipid Profile: No results for input(s): CHOL, HDL, LDLCALC, TRIG, CHOLHDL, LDLDIRECT in the last 72 hours. Thyroid Function Tests:  Recent Labs  04/30/17 0800  TSH 0.540  FREET4 1.06   Anemia Panel: No results for input(s): VITAMINB12, FOLATE, FERRITIN, TIBC, IRON, RETICCTPCT in the last 72 hours. Urine analysis:    Component Value Date/Time   COLORURINE YELLOW 04/30/2017 0610   APPEARANCEUR HAZY (A) 04/30/2017 0610   LABSPEC 1.015 04/30/2017 0610   PHURINE 6.0 04/30/2017 0610   GLUCOSEU NEGATIVE 04/30/2017 0610   HGBUR NEGATIVE 04/30/2017 0610   BILIRUBINUR NEGATIVE 04/30/2017 0610   KETONESUR NEGATIVE 04/30/2017 0610   PROTEINUR 30 (A) 04/30/2017 0610   UROBILINOGEN 0.2 08/16/2010 2011   NITRITE POSITIVE (A) 04/30/2017 0610   LEUKOCYTESUR MODERATE (A) 04/30/2017 0610   Sepsis Labs: @LABRCNTIP (procalcitonin:4,lacticidven:4)  ) Recent Results (from the past 240 hour(s))  Culture, Urine     Status: Abnormal (Preliminary result)   Collection Time: 04/30/17  6:10 AM  Result Value Ref Range Status   Specimen Description URINE, CATHETERIZED  Final   Special Requests NONE  Final   Culture >=100,000  COLONIES/mL GRAM NEGATIVE RODS (A)  Final   Report Status PENDING  Incomplete  Culture, blood (routine x 2)     Status: None (Preliminary result)   Collection Time: 04/30/17  6:38 AM  Result Value Ref Range Status   Specimen Description BLOOD RIGHT ARM  Final   Special Requests   Final    BOTTLES DRAWN AEROBIC AND ANAEROBIC Blood Culture adequate volume   Culture NO GROWTH 1 DAY  Final   Report Status PENDING  Incomplete  Culture, blood (routine x 2)     Status: None (Preliminary result)   Collection Time:  04/30/17  6:50 AM  Result Value Ref Range Status   Specimen Description BLOOD RIGHT HAND  Final   Special Requests   Final    BOTTLES DRAWN AEROBIC ONLY Blood Culture adequate volume   Culture NO GROWTH 1 DAY  Final   Report Status PENDING  Incomplete  MRSA PCR Screening     Status: None   Collection Time: 05/01/17  1:05 AM  Result Value Ref Range Status   MRSA by PCR NEGATIVE NEGATIVE Final    Comment:        The GeneXpert MRSA Assay (FDA approved for NASAL specimens only), is one component of a comprehensive MRSA colonization surveillance program. It is not intended to diagnose MRSA infection nor to guide or monitor treatment for MRSA infections.       Radiology Studies: Ct Head Wo Contrast  Result Date: 04/30/2017 CLINICAL DATA:  Acute development of altered mental status today. EXAM: CT HEAD WITHOUT CONTRAST TECHNIQUE: Contiguous axial images were obtained from the base of the skull through the vertex without intravenous contrast. COMPARISON:  09/01/2016 FINDINGS: Brain: Mild age related volume loss. No sign of acute infarction, mass lesion, hemorrhage, hydrocephalus or extra-axial collection. Vascular: No abnormal vascular finding. Skull: Normal Sinuses/Orbits: Clear/normal Other: None IMPRESSION: No change.  Normal head CT for age.  Mild volume loss. Electronically Signed   By: Nelson Chimes M.D.   On: 04/30/2017 09:07   US Venous Img Lower Unilateral Left  Result  Date: 04/30/2017 CLINICAL DATA:  Calf region pain and edema EXAM: LEFT LOWER EXTREMITY VENOUS DUPLEX ULTRASOUND TECHNIQUE: Gray-scale sonography with graded compression, as well as color Doppler and duplex ultrasound were performed to evaluate the left lower extremity deep venous system from the level of the common femoral vein and including the common femoral, femoral, profunda femoral, popliteal and calf veins including the posterior tibial, peroneal and gastrocnemius veins when visible. The superficial great saphenous vein was also interrogated. Spectral Doppler was utilized to evaluate flow at rest and with distal augmentation maneuvers in the common femoral, femoral and popliteal veins. COMPARISON:  None. FINDINGS: Contralateral Common Femoral Vein: Respiratory phasicity is normal and symmetric with the symptomatic side. No evidence of thrombus. Normal compressibility. Common Femoral Vein: No evidence of thrombus. Normal compressibility, respiratory phasicity and response to augmentation. Saphenofemoral Junction: No evidence of thrombus. Normal compressibility and flow on color Doppler imaging. Profunda Femoral Vein: No evidence of thrombus. Normal compressibility and flow on color Doppler imaging. Femoral Vein: No evidence of thrombus. Normal compressibility, respiratory phasicity and response to augmentation. Popliteal Vein: No evidence of thrombus. Normal compressibility, respiratory phasicity and response to augmentation. Calf Veins: No evidence of thrombus. Normal compressibility and flow on color Doppler imaging. Superficial Great Saphenous Vein: No evidence of thrombus. Normal compressibility. Venous Reflux:  None. Other Findings: There is moderate narrowing of the mid to distal left superficial femoral artery with collateral flow in this area. There is a popliteal fossa cyst measuring 3.0 x 1.5 x 2.9 cm IMPRESSION: 1. No evidence of deep venous thrombosis in the left lower extremity. Right common  femoral artery also patent. 2. Obstructing lesion mid to distal left superficial femoral artery with atherosclerotic plaque in this area. Collateral flow seen in this area. 3. Baker cyst in the left popliteal fossa measuring 3.0 x 1.5 x 2.9 cm. Electronically Signed   By: Lowella Grip III M.D.   On: 04/30/2017 09:52   Dg Chest Port 1 View  Result Date: 04/30/2017 CLINICAL DATA:  Hypothermia.  History of colon cancer, CHF, diabetes, hypertension. Nonsmoker. EXAM: PORTABLE CHEST 1 VIEW COMPARISON:  02/11/2017 FINDINGS: Shallow inspiration. Cardiac enlargement with pulmonary vascular congestion. Infiltration in the lung bases may represent early edema. No consolidation. No pleural effusions. No pneumothorax. Degenerative changes in the spine and shoulders. IMPRESSION: New cardiac enlargement, pulmonary vascular congestion, and mild basilar edema. Electronically Signed   By: Lucienne Capers M.D.   On: 04/30/2017 06:31     Scheduled Meds: . ALPRAZolam  0.5 mg Oral TID  . amLODipine  5 mg Oral Daily  . docusate sodium  100 mg Oral BID  . enoxaparin (LOVENOX) injection  40 mg Subcutaneous Q24H  . furosemide  40 mg Oral Daily  . pravastatin  40 mg Oral q1800  . prednisoLONE acetate  1 drop Both Eyes QID  . triamcinolone cream   Topical BID   Continuous Infusions: . cefTRIAXone (ROCEPHIN)  IV Stopped (05/01/17 1217)  . dextrose 100 mL/hr at 05/01/17 1344     LOS: 0 days    Time spent: 30 min    Janece Canterbury, MD [facility] Pager [phone]  If 7PM-7AM, please contact night-coverage [email or web address] Password [facility] 05/01/2017, 4:38 PM

## 2017-05-01 NOTE — Progress Notes (Signed)
Hypoglycemic Event  CBG: 47  Treatment: amp of D50, increase D10 fluids from 25 ml/hr to 50 ml/hr  Symptoms: non-symptomatic  Follow-up CBG: Time: 0234 CBG Result:159   Possible Reasons for Event:Pt admitted for hypoglycemia  Comments/MD notified:lama notified fluids raised to 50 ml/hr from 25 ml/hr and 1 amp of D50    [name] [name]

## 2017-05-02 LAB — GLUCOSE, CAPILLARY
GLUCOSE-CAPILLARY: 152 mg/dL — AB (ref 65–99)
GLUCOSE-CAPILLARY: 229 mg/dL — AB (ref 65–99)
GLUCOSE-CAPILLARY: 253 mg/dL — AB (ref 65–99)
GLUCOSE-CAPILLARY: 78 mg/dL (ref 65–99)
GLUCOSE-CAPILLARY: 96 mg/dL (ref 65–99)
GLUCOSE-CAPILLARY: 99 mg/dL (ref 65–99)
Glucose-Capillary: 82 mg/dL (ref 65–99)
Glucose-Capillary: 102 mg/dL — ABNORMAL HIGH (ref 65–99)
Glucose-Capillary: 160 mg/dL — ABNORMAL HIGH (ref 65–99)
Glucose-Capillary: 213 mg/dL — ABNORMAL HIGH (ref 65–99)
Glucose-Capillary: 254 mg/dL — ABNORMAL HIGH (ref 65–99)

## 2017-05-02 LAB — URINE CULTURE: Culture: >=100000 — AB

## 2017-05-02 MED ORDER — HYDRALAZINE HCL 20 MG/ML IJ SOLN
10.0000 mg | INTRAMUSCULAR | Status: DC | PRN
Start: 2017-05-02 — End: 2017-05-08

## 2017-05-02 MED ORDER — ALBUTEROL SULFATE (2.5 MG/3ML) 0.083% IN NEBU
2.5000 mg | INHALATION_SOLUTION | Freq: Two times a day (BID) | RESPIRATORY_TRACT | Status: DC
  Administered 2017-05-02 – 2017-05-04 (×5): 2.5 mg via RESPIRATORY_TRACT
  Filled 2017-05-02 (×6): qty 3

## 2017-05-02 NOTE — Progress Notes (Addendum)
PROGRESS NOTE  [name]  JOA:[id number] DOB: 1931-04-19 DOA: 04/30/2017 PCP: [name], MD  Brief Narrative:   [name] is a 83 y.o. female with history of hypertension, diabetes mellitus type 2, chronic diastolic heart failure, erythroderma desquamativum, colon cancer s/p resection in 2006, arthritis and anxiety who presented with unresponsiveness.  She was found to be hypoglycemic and was transferred to the ER where she was hypothermic.  She recently changed to [facility] and she also has a UTI.  Despite being on D10 W at 100 mL/h her blood sugars are in the low 70s this morning.  Continue q2h CBG until CBGs have stabilized.  Her blood sugars are starting to rise this evening so we will cut back on her dextrose infusion to 50 mL/h.    Assessment & Plan:   Active Problems:   Hypoglycemia  Hypoglycemia may have been due to change in her insulin over the last few days, alternatively she may be developing a urinary tract infection -  Continue every 2 hours CBGs -  Decreased D10W to 54ml/h -  Advance diet -  TSH wnl  Enterobacter urinary tract infection present at time of admission -  Continue Ceftriaxone   Hypothermia, likely secondary to hypoglycemia and resolved -  Blood cultures NGTD  Left calf pain:  -  Lower extremity duplex:  Peripheral vascular disease and Baker's cyst -  Continue tylenol and prn ibuprofen  Chronic diastolic heart failure, decreased wheezing today -  Continue Lasix   Hypertension, elevated blood pressures -  Continue labetalol and norvasc -  Add prn hydralazine  Asthma, stable -  duoneb now and prn albuterol -  CXR without infiltrate  Anxiety, stable, continue xanax  HLD, chronic stable, continue pravastatin  Rash, RN and pharmacist have thoroughly reviewed medications and discovered pravastatin can cause a psoriasis like skin rash - d/c pravastatin - continue steroid creams  DVT prophylaxis: lovenox  Code Status:  DNR Family Communication:  Patient alone Disposition Plan:  home once CBG stabilize  Consultants:   none  Procedures:  none  Antimicrobials:  Anti-infectives    Start     Dose/Rate Route Frequency Ordered Stop   04/30/17 0800  cefTRIAXone (ROCEPHIN) 1 g in dextrose 5 % 50 mL IVPB     1 g 100 mL/hr over 30 Minutes Intravenous Every 24 hours 04/30/17 0743         Subjective:  Feeling pretty good today.  States her breathing is much better.  Denies jitteriness, shakiness, sweating, or other symptoms of hypoglycemia.  Objective: Vitals:   05/02/17 0826 05/02/17 1123 05/02/17 1207 05/02/17 1651  BP: (!) 189/72     Pulse:      Resp:      Temp:   98.1 F (36.7 C) 98.9 F (37.2 C)  TempSrc:   Oral Oral  SpO2:  98%    Weight:      Height:        Intake/Output Summary (Last 24 hours) at 05/02/17 1725 Last data filed at 05/02/17 1533  Gross per 24 hour  Intake               50 ml  Output             1550 ml  Net            -1500 ml   Filed Weights   04/30/17 0601 04/30/17 2019 05/02/17 0400  Weight: 76.2 kg (168 lb) 81.6 kg (179 lb  14.3 oz) 80.5 kg (177 lb 7.5 oz)    Examination:  General exam:  Adult female.  No acute distress.  HEENT:  NCAT, MMM Respiratory system: Decreased wheezing, no focal rales or rhonchi Cardiovascular system: Regular rate and rhythm, normal S1/S2. No murmurs, rubs, gallops or clicks.  Warm extremities Gastrointestinal system: Normal active bowel sounds, soft, nondistended, nontender. MSK:  Normal tone and bulk, 1+ pitting lower extremity edema Neuro:  Grossly intact Derm: Erythematous desquamating macular rash on trunk, arms, and legs   Data Reviewed: I have personally reviewed following labs and imaging studies  CBC:  Recent Labs Lab 04/30/17 0637 05/01/17 0543  WBC 8.9 8.0  NEUTROABS 7.6  --   HGB 11.6* 11.3*  HCT 35.3* 34.7*  MCV 89.8 91.1  PLT 252 790   Basic Metabolic Panel:  Recent Labs Lab 04/30/17 0637  05/01/17 0543  NA 141 140  K 3.4* 4.1  CL 108 110  CO2 26 21*  GLUCOSE 120* 78  BUN 23* 20  CREATININE 0.84 0.92  CALCIUM 8.1* 8.3*   GFR: Estimated Creatinine Clearance: 46.7 mL/min (by C-G formula based on SCr of 0.92 mg/dL). Liver Function Tests:  Recent Labs Lab 04/30/17 0637  AST 25  ALT 20  ALKPHOS 57  BILITOT 0.4  PROT 6.2*  ALBUMIN 2.9*   No results for input(s): LIPASE, AMYLASE in the last 168 hours. No results for input(s): AMMONIA in the last 168 hours. Coagulation Profile: No results for input(s): INR, PROTIME in the last 168 hours. Cardiac Enzymes:  Recent Labs Lab 04/30/17 0637  TROPONINI <0.03   BNP (last 3 results) No results for input(s): PROBNP in the last 8760 hours. HbA1C: No results for input(s): HGBA1C in the last 72 hours. CBG:  Recent Labs Lab 05/02/17 0624 05/02/17 0804 05/02/17 1039 05/02/17 1205 05/02/17 1617  GLUCAP 82 102* 160* 152* 213*   Lipid Profile: No results for input(s): CHOL, HDL, LDLCALC, TRIG, CHOLHDL, LDLDIRECT in the last 72 hours. Thyroid Function Tests:  Recent Labs  04/30/17 0800  TSH 0.540  FREET4 1.06   Anemia Panel: No results for input(s): VITAMINB12, FOLATE, FERRITIN, TIBC, IRON, RETICCTPCT in the last 72 hours. Urine analysis:    Component Value Date/Time   COLORURINE YELLOW 04/30/2017 0610   APPEARANCEUR HAZY (A) 04/30/2017 0610   LABSPEC 1.015 04/30/2017 0610   PHURINE 6.0 04/30/2017 0610   GLUCOSEU NEGATIVE 04/30/2017 0610   HGBUR NEGATIVE 04/30/2017 0610   BILIRUBINUR NEGATIVE 04/30/2017 0610   KETONESUR NEGATIVE 04/30/2017 0610   PROTEINUR 30 (A) 04/30/2017 0610   UROBILINOGEN 0.2 08/16/2010 2011   NITRITE POSITIVE (A) 04/30/2017 0610   LEUKOCYTESUR MODERATE (A) 04/30/2017 0610   Sepsis Labs: @LABRCNTIP (procalcitonin:4,lacticidven:4)  ) Recent Results (from the past 240 hour(s))  Culture, Urine     Status: Abnormal   Collection Time: 04/30/17  6:10 AM  Result Value Ref Range  Status   Specimen Description URINE, CATHETERIZED  Final   Special Requests NONE  Final   Culture >=100,000 COLONIES/mL ENTEROBACTER AEROGENES (A)  Final   Report Status 05/02/2017 FINAL  Final   Organism ID, Bacteria ENTEROBACTER AEROGENES (A)  Final      Susceptibility   Enterobacter aerogenes - MIC*    CEFAZOLIN >=64 RESISTANT Resistant     CEFTRIAXONE <=1 SENSITIVE Sensitive     CIPROFLOXACIN <=0.25 SENSITIVE Sensitive     GENTAMICIN <=1 SENSITIVE Sensitive     IMIPENEM 1 SENSITIVE Sensitive     NITROFURANTOIN 64 INTERMEDIATE Intermediate  TRIMETH/SULFA <=20 SENSITIVE Sensitive     PIP/TAZO <=4 SENSITIVE Sensitive     * >=100,000 COLONIES/mL ENTEROBACTER AEROGENES  Culture, blood (routine x 2)     Status: None (Preliminary result)   Collection Time: 04/30/17  6:38 AM  Result Value Ref Range Status   Specimen Description BLOOD RIGHT ARM  Final   Special Requests   Final    BOTTLES DRAWN AEROBIC AND ANAEROBIC Blood Culture adequate volume   Culture NO GROWTH 2 DAYS  Final   Report Status PENDING  Incomplete  Culture, blood (routine x 2)     Status: None (Preliminary result)   Collection Time: 04/30/17  6:50 AM  Result Value Ref Range Status   Specimen Description BLOOD RIGHT HAND  Final   Special Requests   Final    BOTTLES DRAWN AEROBIC ONLY Blood Culture adequate volume   Culture NO GROWTH 2 DAYS  Final   Report Status PENDING  Incomplete  MRSA PCR Screening     Status: None   Collection Time: 05/01/17  1:05 AM  Result Value Ref Range Status   MRSA by PCR NEGATIVE NEGATIVE Final    Comment:        The GeneXpert MRSA Assay (FDA approved for NASAL specimens only), is one component of a comprehensive MRSA colonization surveillance program. It is not intended to diagnose MRSA infection nor to guide or monitor treatment for MRSA infections.       Radiology Studies: No results found.   Scheduled Meds: . albuterol  2.5 mg Nebulization BID  . ALPRAZolam  0.5 mg  Oral TID  . amLODipine  5 mg Oral Daily  . docusate sodium  100 mg Oral BID  . enoxaparin (LOVENOX) injection  40 mg Subcutaneous Q24H  . furosemide  40 mg Oral Daily  . oxyCODONE-acetaminophen  2 tablet Oral Once  . pravastatin  40 mg Oral q1800  . prednisoLONE acetate  1 drop Both Eyes QID  . triamcinolone cream   Topical BID   Continuous Infusions: . cefTRIAXone (ROCEPHIN)  IV Stopped (05/02/17 0851)  . dextrose 100 mL/hr at 05/02/17 1500     LOS: 0 days    Time spent: 30 min    Janece Canterbury, MD [facility] Pager [phone]  If 7PM-7AM, please contact night-coverage [email or web address] Password [email or web address] 05/02/2017, 5:25 PM

## 2017-05-03 DIAGNOSIS — M79662 Pain in left lower leg: Secondary | ICD-10-CM

## 2017-05-03 LAB — GLUCOSE, CAPILLARY
GLUCOSE-CAPILLARY: 148 mg/dL — AB (ref 65–99)
GLUCOSE-CAPILLARY: 164 mg/dL — AB (ref 65–99)
GLUCOSE-CAPILLARY: 165 mg/dL — AB (ref 65–99)
GLUCOSE-CAPILLARY: 168 mg/dL — AB (ref 65–99)
GLUCOSE-CAPILLARY: 184 mg/dL — AB (ref 65–99)
GLUCOSE-CAPILLARY: 200 mg/dL — AB (ref 65–99)
GLUCOSE-CAPILLARY: 203 mg/dL — AB (ref 65–99)
GLUCOSE-CAPILLARY: 226 mg/dL — AB (ref 65–99)
Glucose-Capillary: 156 mg/dL — ABNORMAL HIGH (ref 65–99)
Glucose-Capillary: 223 mg/dL — ABNORMAL HIGH (ref 65–99)
Glucose-Capillary: 228 mg/dL — ABNORMAL HIGH (ref 65–99)
Glucose-Capillary: 250 mg/dL — ABNORMAL HIGH (ref 65–99)

## 2017-05-03 MED ORDER — DIPHENHYDRAMINE HCL 25 MG PO CAPS
25.0000 mg | ORAL_CAPSULE | ORAL | Status: DC | PRN
  Administered 2017-05-04 – 2017-05-07 (×5): 25 mg via ORAL
  Filled 2017-05-03 (×6): qty 1

## 2017-05-03 MED ORDER — DICLOFENAC SODIUM 1 % TD GEL
4.0000 g | Freq: Four times a day (QID) | TRANSDERMAL | Status: DC
  Administered 2017-05-03 – 2017-05-08 (×21): 4 g via TOPICAL
  Filled 2017-05-03: qty 100

## 2017-05-03 MED ORDER — DIPHENHYDRAMINE HCL 25 MG PO CAPS
50.0000 mg | ORAL_CAPSULE | Freq: Once | ORAL | Status: AC
Start: 2017-05-03 — End: 2017-05-03
  Administered 2017-05-03: 50 mg via ORAL
  Filled 2017-05-03: qty 2

## 2017-05-03 MED ORDER — OXYCODONE HCL 5 MG PO TABS
5.0000 mg | ORAL_TABLET | Freq: Four times a day (QID) | ORAL | Status: DC | PRN
  Administered 2017-05-03 (×3): 5 mg via ORAL
  Filled 2017-05-03 (×3): qty 1

## 2017-05-03 NOTE — Progress Notes (Signed)
PROGRESS NOTE  [name]  GMW:[id number] DOB: 07-02-1931 DOA: 04/30/2017 PCP: [name], MD  Brief Narrative:   [name] is a 83 y.o. female with history of hypertension, diabetes mellitus type 2, chronic diastolic heart failure, erythroderma desquamativum, colon cancer s/p resection in 2006, arthritis and anxiety who presented with unresponsiveness.  She was found to be hypoglycemic and was transferred to the ER where she was hypothermic.  She recently changed to [facility] and she also has a UTI.  Despite being on D10 W at 100 mL/h her blood sugars are in the low 70s this morning.  Continue q2h CBG until CBGs have stabilized.  Her blood sugars are starting to rise this evening so we will cut back on her dextrose infusion to 50 mL/h.    Assessment & Plan:   Active Problems:   Hypoglycemia  Hypoglycemia may have been due to change in her insulin over the last few days, alternatively she may be developing a urinary tract infection -  Continue every 2 hours CBGs -  D/c D10W this morning -  TSH wnl  Enterobacter urinary tract infection present at time of admission -  Continue Ceftriaxone day 4 of 5  Hypothermia, likely secondary to hypoglycemia and resolved -  Blood cultures NGTD  Left calf pain and swelling:  -  Lower extremity duplex:  Peripheral vascular disease and Baker's cyst -  Continue tylenol and prn ibuprofen -  Add voltaren gel  Chronic diastolic heart failure, decreased wheezing -  Continue Lasix   Hypertension, elevated blood pressures -  Continue labetalol and norvasc -  Add prn hydralazine  Asthma, stable -  duoneb now and prn albuterol -  CXR without infiltrate  Anxiety, stable, continue xanax  HLD, chronic stable, continue pravastatin  Rash, RN and pharmacist have thoroughly reviewed medications and discovered pravastatin can cause a psoriasis like skin rash - d/c'd pravastatin - continue steroid creams  DVT prophylaxis: lovenox    Code Status: DNR Family Communication:  Patient alone Disposition Plan:  home once CBG stabilize off of D10  Consultants:   none  Procedures:  none  Antimicrobials:  Anti-infectives    Start     Dose/Rate Route Frequency Ordered Stop   04/30/17 0800  cefTRIAXone (ROCEPHIN) 1 g in dextrose 5 % 50 mL IVPB     1 g 100 mL/hr over 30 Minutes Intravenous Every 24 hours 04/30/17 0743         Subjective:  Feeling well. Had increased pain in the right anterior knee last night.  Received tylenol and ibuprofen without relief and was given oxycodone which caused her to fall asleep and be mildly hypoxic.  Still having some pain this morning.  Denies SOB, jitters/shakes, diaphoresis.  Eating well.    Objective: Vitals:   05/03/17 0500 05/03/17 0600 05/03/17 0745 05/03/17 0807  BP: (!) 149/52 136/60    Pulse: 80 75    Resp: 20 17    Temp:    98 F (36.7 C)  TempSrc:    Oral  SpO2: 91% 95% 95%   Weight:      Height:        Intake/Output Summary (Last 24 hours) at 05/03/17 1341 Last data filed at 05/03/17 0423  Gross per 24 hour  Intake          4456.25 ml  Output             1750 ml  Net  2706.25 ml   Filed Weights   04/30/17 2019 05/02/17 0400 05/03/17 0423  Weight: 81.6 kg (179 lb 14.3 oz) 80.5 kg (177 lb 7.5 oz) 79.2 kg (174 lb 9.7 oz)    Examination:  General exam:  Adult female.  No acute distress.  HEENT:  NCAT, MMM Respiratory system:  Faint wheeze, otherwise no rales or rhonchi Cardiovascular system: Regular rate and rhythm, normal S1/S2. No murmurs, rubs, gallops or clicks.  Warm extremities Gastrointestinal system: Normal active bowel sounds, soft, nondistended, nontender. MSK:  Normal tone and bulk, 1+ pitting left  lower extremity edema, obvious arthritis of the bilateral knees, left > right.  Baker's cyst behind left knee Neuro:  Grossly intact Derm: Erythematous desquamating macular rash on trunk, arms, and legs  Data Reviewed: I have personally  reviewed following labs and imaging studies  CBC:  Recent Labs Lab 04/30/17 0637 05/01/17 0543  WBC 8.9 8.0  NEUTROABS 7.6  --   HGB 11.6* 11.3*  HCT 35.3* 34.7*  MCV 89.8 91.1  PLT 252 433   Basic Metabolic Panel:  Recent Labs Lab 04/30/17 0637 05/01/17 0543  NA 141 140  K 3.4* 4.1  CL 108 110  CO2 26 21*  GLUCOSE 120* 78  BUN 23* 20  CREATININE 0.84 0.92  CALCIUM 8.1* 8.3*   GFR: Estimated Creatinine Clearance: 46.3 mL/min (by C-G formula based on SCr of 0.92 mg/dL). Liver Function Tests:  Recent Labs Lab 04/30/17 0637  AST 25  ALT 20  ALKPHOS 57  BILITOT 0.4  PROT 6.2*  ALBUMIN 2.9*   No results for input(s): LIPASE, AMYLASE in the last 168 hours. No results for input(s): AMMONIA in the last 168 hours. Coagulation Profile: No results for input(s): INR, PROTIME in the last 168 hours. Cardiac Enzymes:  Recent Labs Lab 04/30/17 0637  TROPONINI <0.03   BNP (last 3 results) No results for input(s): PROBNP in the last 8760 hours. HbA1C: No results for input(s): HGBA1C in the last 72 hours. CBG:  Recent Labs Lab 05/03/17 0419 05/03/17 0622 05/03/17 0804 05/03/17 1021 05/03/17 1158  GLUCAP 156* 165* 148* 200* 164*   Lipid Profile: No results for input(s): CHOL, HDL, LDLCALC, TRIG, CHOLHDL, LDLDIRECT in the last 72 hours. Thyroid Function Tests: No results for input(s): TSH, T4TOTAL, FREET4, T3FREE, THYROIDAB in the last 72 hours. Anemia Panel: No results for input(s): VITAMINB12, FOLATE, FERRITIN, TIBC, IRON, RETICCTPCT in the last 72 hours. Urine analysis:    Component Value Date/Time   COLORURINE YELLOW 04/30/2017 0610   APPEARANCEUR HAZY (A) 04/30/2017 0610   LABSPEC 1.015 04/30/2017 0610   PHURINE 6.0 04/30/2017 0610   GLUCOSEU NEGATIVE 04/30/2017 0610   HGBUR NEGATIVE 04/30/2017 0610   BILIRUBINUR NEGATIVE 04/30/2017 0610   KETONESUR NEGATIVE 04/30/2017 0610   PROTEINUR 30 (A) 04/30/2017 0610   UROBILINOGEN 0.2 08/16/2010 2011    NITRITE POSITIVE (A) 04/30/2017 0610   LEUKOCYTESUR MODERATE (A) 04/30/2017 0610   Sepsis Labs: @LABRCNTIP (procalcitonin:4,lacticidven:4)  ) Recent Results (from the past 240 hour(s))  Culture, Urine     Status: Abnormal   Collection Time: 04/30/17  6:10 AM  Result Value Ref Range Status   Specimen Description URINE, CATHETERIZED  Final   Special Requests NONE  Final   Culture >=100,000 COLONIES/mL ENTEROBACTER AEROGENES (A)  Final   Report Status 05/02/2017 FINAL  Final   Organism ID, Bacteria ENTEROBACTER AEROGENES (A)  Final      Susceptibility   Enterobacter aerogenes - MIC*    CEFAZOLIN >=64  RESISTANT Resistant     CEFTRIAXONE <=1 SENSITIVE Sensitive     CIPROFLOXACIN <=0.25 SENSITIVE Sensitive     GENTAMICIN <=1 SENSITIVE Sensitive     IMIPENEM 1 SENSITIVE Sensitive     NITROFURANTOIN 64 INTERMEDIATE Intermediate     TRIMETH/SULFA <=20 SENSITIVE Sensitive     PIP/TAZO <=4 SENSITIVE Sensitive     * >=100,000 COLONIES/mL ENTEROBACTER AEROGENES  Culture, blood (routine x 2)     Status: None (Preliminary result)   Collection Time: 04/30/17  6:38 AM  Result Value Ref Range Status   Specimen Description BLOOD RIGHT ARM  Final   Special Requests   Final    BOTTLES DRAWN AEROBIC AND ANAEROBIC Blood Culture adequate volume   Culture NO GROWTH 3 DAYS  Final   Report Status PENDING  Incomplete  Culture, blood (routine x 2)     Status: None (Preliminary result)   Collection Time: 04/30/17  6:50 AM  Result Value Ref Range Status   Specimen Description BLOOD RIGHT HAND  Final   Special Requests   Final    BOTTLES DRAWN AEROBIC ONLY Blood Culture adequate volume   Culture NO GROWTH 3 DAYS  Final   Report Status PENDING  Incomplete  MRSA PCR Screening     Status: None   Collection Time: 05/01/17  1:05 AM  Result Value Ref Range Status   MRSA by PCR NEGATIVE NEGATIVE Final    Comment:        The GeneXpert MRSA Assay (FDA approved for NASAL specimens only), is one component of  a comprehensive MRSA colonization surveillance program. It is not intended to diagnose MRSA infection nor to guide or monitor treatment for MRSA infections.       Radiology Studies: No results found.   Scheduled Meds: . albuterol  2.5 mg Nebulization BID  . ALPRAZolam  0.5 mg Oral TID  . amLODipine  5 mg Oral Daily  . diclofenac sodium  4 g Topical QID  . docusate sodium  100 mg Oral BID  . enoxaparin (LOVENOX) injection  40 mg Subcutaneous Q24H  . furosemide  40 mg Oral Daily  . oxyCODONE-acetaminophen  2 tablet Oral Once  . prednisoLONE acetate  1 drop Both Eyes QID  . triamcinolone cream   Topical BID   Continuous Infusions: . cefTRIAXone (ROCEPHIN)  IV Stopped (05/03/17 0839)     LOS: 0 days    Time spent: 30 min    Janece Canterbury, MD [facility] Pager [phone]  If 7PM-7AM, please contact night-coverage [email or web address] Password [email or web address] 05/03/2017, 1:41 PM

## 2017-05-03 NOTE — Plan of Care (Signed)
Problem: Pain Managment: Goal: General experience of comfort will improve Outcome: Progressing Patient has chronic pain in Left knee. She states heat packs and tylenol has helped.  Problem: Skin Integrity: Goal: Risk for impaired skin integrity will decrease Outcome: Progressing Patient has a severe rash all over body. Patient skin is itchy, dry, and peeling so she is scratching vigorously. Patient has many spots that are bleeding due to the scratching. patient advised to try her hardest to refrain from scratching. Prescribed cream applied to all over body, foams placed on bleeding areas, and benadryl given.

## 2017-05-03 NOTE — Progress Notes (Signed)
Place patient on 2L [place] while sleeping. Patient drops to 87 while asleep.  Patient complained of severe pain in her L knee. PRN oxycodone 5mg  ordered and given. Patient had relief from pain and slept through the night.

## 2017-05-04 ENCOUNTER — Observation Stay (HOSPITAL_COMMUNITY): Payer: Medicare Other

## 2017-05-04 DIAGNOSIS — E119 Type 2 diabetes mellitus without complications: Secondary | ICD-10-CM | POA: Diagnosis not present

## 2017-05-04 DIAGNOSIS — I110 Hypertensive heart disease with heart failure: Secondary | ICD-10-CM | POA: Diagnosis present

## 2017-05-04 DIAGNOSIS — R2689 Other abnormalities of gait and mobility: Secondary | ICD-10-CM | POA: Diagnosis not present

## 2017-05-04 DIAGNOSIS — R4182 Altered mental status, unspecified: Secondary | ICD-10-CM | POA: Diagnosis not present

## 2017-05-04 DIAGNOSIS — J9600 Acute respiratory failure, unspecified whether with hypoxia or hypercapnia: Secondary | ICD-10-CM | POA: Diagnosis present

## 2017-05-04 DIAGNOSIS — I340 Nonrheumatic mitral (valve) insufficiency: Secondary | ICD-10-CM | POA: Diagnosis not present

## 2017-05-04 DIAGNOSIS — M79662 Pain in left lower leg: Secondary | ICD-10-CM | POA: Diagnosis not present

## 2017-05-04 DIAGNOSIS — I5033 Acute on chronic diastolic (congestive) heart failure: Secondary | ICD-10-CM | POA: Diagnosis not present

## 2017-05-04 DIAGNOSIS — Z85038 Personal history of other malignant neoplasm of large intestine: Secondary | ICD-10-CM | POA: Diagnosis not present

## 2017-05-04 DIAGNOSIS — Z66 Do not resuscitate: Secondary | ICD-10-CM | POA: Diagnosis present

## 2017-05-04 DIAGNOSIS — R918 Other nonspecific abnormal finding of lung field: Secondary | ICD-10-CM | POA: Diagnosis not present

## 2017-05-04 DIAGNOSIS — J9601 Acute respiratory failure with hypoxia: Secondary | ICD-10-CM | POA: Diagnosis not present

## 2017-05-04 DIAGNOSIS — Z794 Long term (current) use of insulin: Secondary | ICD-10-CM | POA: Diagnosis not present

## 2017-05-04 DIAGNOSIS — M6281 Muscle weakness (generalized): Secondary | ICD-10-CM | POA: Diagnosis not present

## 2017-05-04 DIAGNOSIS — E11649 Type 2 diabetes mellitus with hypoglycemia without coma: Secondary | ICD-10-CM | POA: Diagnosis present

## 2017-05-04 DIAGNOSIS — F419 Anxiety disorder, unspecified: Secondary | ICD-10-CM | POA: Diagnosis present

## 2017-05-04 DIAGNOSIS — R21 Rash and other nonspecific skin eruption: Secondary | ICD-10-CM | POA: Diagnosis present

## 2017-05-04 DIAGNOSIS — J45909 Unspecified asthma, uncomplicated: Secondary | ICD-10-CM | POA: Diagnosis present

## 2017-05-04 DIAGNOSIS — R509 Fever, unspecified: Secondary | ICD-10-CM | POA: Diagnosis not present

## 2017-05-04 DIAGNOSIS — B9689 Other specified bacterial agents as the cause of diseases classified elsewhere: Secondary | ICD-10-CM | POA: Diagnosis present

## 2017-05-04 DIAGNOSIS — J9811 Atelectasis: Secondary | ICD-10-CM | POA: Diagnosis present

## 2017-05-04 DIAGNOSIS — N390 Urinary tract infection, site not specified: Secondary | ICD-10-CM | POA: Diagnosis present

## 2017-05-04 DIAGNOSIS — R680 Hypothermia, not associated with low environmental temperature: Secondary | ICD-10-CM | POA: Diagnosis present

## 2017-05-04 DIAGNOSIS — R278 Other lack of coordination: Secondary | ICD-10-CM | POA: Diagnosis not present

## 2017-05-04 DIAGNOSIS — C189 Malignant neoplasm of colon, unspecified: Secondary | ICD-10-CM | POA: Diagnosis not present

## 2017-05-04 DIAGNOSIS — E162 Hypoglycemia, unspecified: Secondary | ICD-10-CM | POA: Diagnosis not present

## 2017-05-04 DIAGNOSIS — J9621 Acute and chronic respiratory failure with hypoxia: Secondary | ICD-10-CM | POA: Diagnosis not present

## 2017-05-04 DIAGNOSIS — E1151 Type 2 diabetes mellitus with diabetic peripheral angiopathy without gangrene: Secondary | ICD-10-CM | POA: Diagnosis present

## 2017-05-04 DIAGNOSIS — I10 Essential (primary) hypertension: Secondary | ICD-10-CM | POA: Diagnosis not present

## 2017-05-04 DIAGNOSIS — Z79899 Other long term (current) drug therapy: Secondary | ICD-10-CM | POA: Diagnosis not present

## 2017-05-04 DIAGNOSIS — Z888 Allergy status to other drugs, medicaments and biological substances status: Secondary | ICD-10-CM | POA: Diagnosis not present

## 2017-05-04 DIAGNOSIS — E7849 Other hyperlipidemia: Secondary | ICD-10-CM | POA: Diagnosis not present

## 2017-05-04 DIAGNOSIS — E876 Hypokalemia: Secondary | ICD-10-CM | POA: Diagnosis present

## 2017-05-04 DIAGNOSIS — M7120 Synovial cyst of popliteal space [Baker], unspecified knee: Secondary | ICD-10-CM | POA: Diagnosis present

## 2017-05-04 DIAGNOSIS — E785 Hyperlipidemia, unspecified: Secondary | ICD-10-CM | POA: Diagnosis not present

## 2017-05-04 DIAGNOSIS — I5022 Chronic systolic (congestive) heart failure: Secondary | ICD-10-CM | POA: Diagnosis not present

## 2017-05-04 LAB — URINALYSIS, ROUTINE W REFLEX MICROSCOPIC
BILIRUBIN URINE: NEGATIVE
GLUCOSE, UA: NEGATIVE mg/dL
Hgb urine dipstick: NEGATIVE
KETONES UR: NEGATIVE mg/dL
NITRITE: NEGATIVE
PH: 6 (ref 5.0–8.0)
Protein, ur: 30 mg/dL — AB
SPECIFIC GRAVITY, URINE: 1.02 (ref 1.005–1.030)
SQUAMOUS EPITHELIAL / LPF: NONE SEEN

## 2017-05-04 LAB — CBC WITH DIFFERENTIAL/PLATELET
BASOS PCT: 0 %
Basophils Absolute: 0 10*3/uL (ref 0.0–0.1)
EOS ABS: 0.4 10*3/uL (ref 0.0–0.7)
EOS PCT: 4 %
HCT: 37.3 % (ref 36.0–46.0)
Hemoglobin: 12.3 g/dL (ref 12.0–15.0)
LYMPHS ABS: 1.6 10*3/uL (ref 0.7–4.0)
Lymphocytes Relative: 16 %
MCH: 29.6 pg (ref 26.0–34.0)
MCHC: 33 g/dL (ref 30.0–36.0)
MCV: 89.7 fL (ref 78.0–100.0)
MONO ABS: 1.6 10*3/uL — AB (ref 0.1–1.0)
MONOS PCT: 17 %
NEUTROS PCT: 63 %
Neutro Abs: 6 10*3/uL (ref 1.7–7.7)
PLATELETS: 274 10*3/uL (ref 150–400)
RBC: 4.16 MIL/uL (ref 3.87–5.11)
RDW: 14 % (ref 11.5–15.5)
WBC: 9.7 10*3/uL (ref 4.0–10.5)

## 2017-05-04 LAB — GLUCOSE, CAPILLARY
GLUCOSE-CAPILLARY: 178 mg/dL — AB (ref 65–99)
GLUCOSE-CAPILLARY: 181 mg/dL — AB (ref 65–99)
GLUCOSE-CAPILLARY: 183 mg/dL — AB (ref 65–99)
GLUCOSE-CAPILLARY: 225 mg/dL — AB (ref 65–99)
GLUCOSE-CAPILLARY: 237 mg/dL — AB (ref 65–99)
GLUCOSE-CAPILLARY: 265 mg/dL — AB (ref 65–99)
Glucose-Capillary: 205 mg/dL — ABNORMAL HIGH (ref 65–99)
Glucose-Capillary: 227 mg/dL — ABNORMAL HIGH (ref 65–99)

## 2017-05-04 LAB — BASIC METABOLIC PANEL
Anion gap: 10 (ref 5–15)
BUN: 30 mg/dL — AB (ref 6–20)
CALCIUM: 8.8 mg/dL — AB (ref 8.9–10.3)
CHLORIDE: 99 mmol/L — AB (ref 101–111)
CO2: 27 mmol/L (ref 22–32)
CREATININE: 1.03 mg/dL — AB (ref 0.44–1.00)
GFR calc non Af Amer: 49 mL/min — ABNORMAL LOW (ref >60)
GFR, EST AFRICAN AMERICAN: 56 mL/min — AB (ref >60)
Glucose, Bld: 192 mg/dL — ABNORMAL HIGH (ref 65–99)
Potassium: 3.8 mmol/L (ref 3.5–5.1)
Sodium: 136 mmol/L (ref 135–145)

## 2017-05-04 MED ORDER — TRAMADOL HCL 50 MG PO TABS
50.0000 mg | ORAL_TABLET | Freq: Four times a day (QID) | ORAL | Status: DC | PRN
  Administered 2017-05-04 – 2017-05-06 (×3): 50 mg via ORAL
  Filled 2017-05-04 (×3): qty 1

## 2017-05-04 MED ORDER — INSULIN ASPART 100 UNIT/ML ~~LOC~~ SOLN
0.0000 [IU] | Freq: Three times a day (TID) | SUBCUTANEOUS | Status: DC
  Administered 2017-05-04: 2 [IU] via SUBCUTANEOUS
  Administered 2017-05-04: 3 [IU] via SUBCUTANEOUS
  Administered 2017-05-04: 2 [IU] via SUBCUTANEOUS
  Administered 2017-05-05 (×2): 3 [IU] via SUBCUTANEOUS
  Administered 2017-05-05 – 2017-05-06 (×2): 2 [IU] via SUBCUTANEOUS
  Administered 2017-05-06: 5 [IU] via SUBCUTANEOUS

## 2017-05-04 MED ORDER — FUROSEMIDE 40 MG PO TABS: 40.0000 mg | ORAL_TABLET | Freq: Two times a day (BID) | ORAL | Status: DC

## 2017-05-04 MED ORDER — INSULIN ASPART 100 UNIT/ML ~~LOC~~ SOLN
0.0000 [IU] | Freq: Every day | SUBCUTANEOUS | Status: DC
Start: 2017-05-04 — End: 2017-05-06
  Administered 2017-05-04: 3 [IU] via SUBCUTANEOUS

## 2017-05-04 MED ORDER — FUROSEMIDE 10 MG/ML IJ SOLN
40.0000 mg | Freq: Two times a day (BID) | INTRAMUSCULAR | Status: AC
  Administered 2017-05-04 – 2017-05-07 (×8): 40 mg via INTRAVENOUS
  Filled 2017-05-04 (×8): qty 4

## 2017-05-04 NOTE — Evaluation (Signed)
Physical Therapy Evaluation Patient Details Name: [name] MRN: [id number] DOB: November 04, 1930 Today's Date: 05/04/2017   History of Present Illness  [name] is a 83 y.o. female with history of hypertension, diabetes mellitus type 2, chronic diastolic heart failure, erythroderma desquamativum, colon cancer s/p resection in 2006, arthritis and anxiety who presents with unresponsiveness. She was apparently in her normal state of health except for feeling chilly yesterday according to her daughter and she was normal when she went to bed. This morning, around 4:30am when her daughter was getting ready to leave, she turned on the light and the patient was unresponsive in her bed. She had a little bit of spittle crusted around her mouth and did not open her eyes or respond to touch.  The daughter called EMS.  When EMS checked her blood sugar was 54 and she was given an amp of D50 which caused her blood sugars to rise to the 240s.    Clinical Impression  Patient limited for standing and unable to take steps due to BLE weakness and c/o severe left knee pain (see below).  Patient will benefit from continued physical therapy in hospital and recommended venue below to increase strength, balance, endurance for safe ADLs and gait.    Follow Up Recommendations SNF;Supervision/Assistance - 24 hour    Equipment Recommendations  None recommended by PT    Recommendations for Other Services       Precautions / Restrictions Precautions Precautions: Fall Restrictions Weight Bearing Restrictions: No      Mobility  Bed Mobility Overal bed mobility: Needs Assistance Bed Mobility: Supine to Sit;Sit to Supine;Rolling Rolling: Min assist   Supine to sit: Mod assist Sit to supine: Mod assist      Transfers Overall transfer level: Needs assistance Equipment used: Rolling walker (2 wheeled);1 person hand held assist Transfers: Sit to/from [facility] Sit to Stand: Mod  assist Stand pivot transfers: Max assist       General transfer comment: Patient stood with RW, but unable to take steps due to weakness/left knee pain and required stand pivot to transfer to wheelchair  Ambulation/Gait Ambulation/Gait assistance: Max assist Ambulation Distance (Feet): 0 Feet Assistive device: Rolling walker (2 wheeled) Gait Pattern/deviations: Decreased stance time - left;Decreased stance time - right     General Gait Details: Patient unable to take steps with RW due to BLE weakness and left knee pain  Stairs            Wheelchair Mobility    Modified Rankin (Stroke Patients Only)       Balance                                             Pertinent Vitals/Pain Pain Assessment: 0-10 Pain Score: 8  Pain Location: left knee Pain Descriptors / Indicators: Sharp;Pressure;Aching Pain Intervention(s): Limited activity within patient's tolerance;Monitored during session;Premedicated before session    [place] expects to be discharged to:: Private residence Living Arrangements: Alone Available Help at Discharge: Family;Available PRN/intermittently (patient states her grandaughter comes by daily) Type of Home: House Home Access: Stairs to enter Entrance Stairs-Rails: Right Entrance Stairs-Number of Steps: 5 Home Layout: Two level Home Equipment: Cane - single point;Walker - 2 wheels;Shower seat      Prior Function Level of Independence: Independent with assistive device(s)         Comments: Patient  states she does not like using RW, uses SPC most of time     Hand Dominance        Extremity/Trunk Assessment   Upper Extremity Assessment Upper Extremity Assessment: Generalized weakness    Lower Extremity Assessment Lower Extremity Assessment: Generalized weakness;LLE deficits/detail;RLE deficits/detail RLE Deficits / Details: grossly -4/5, right knee ROM limited to 0-65 degrees LLE Deficits / Details:  grossly -3/5 due to pain, ROM left knee limited to 0-85 degrees    Cervical / Trunk Assessment Cervical / Trunk Assessment: Normal  Communication   Communication: HOH;No difficulties (no difficulies when you speak louder )  Cognition Arousal/Alertness: Awake/alert Behavior During Therapy: WFL for tasks assessed/performed Overall Cognitive Status: Within Functional Limits for tasks assessed                                        General Comments      Exercises     Assessment/Plan    PT Assessment Patient needs continued PT services  PT Problem List Decreased strength;Decreased range of motion;Decreased activity tolerance;Decreased balance;Decreased mobility       PT Treatment Interventions Gait training;Stair training;Functional mobility training;Therapeutic activities;Therapeutic exercise;Patient/family education    PT Goals (Current goals can be found in the Care Plan section)  Acute Rehab PT Goals Patient Stated Goal: return home after rehab PT Goal Formulation: With patient Time For Goal Achievement: 05/18/17 Potential to Achieve Goals: Good    Frequency Min 3X/week   Barriers to discharge        Co-evaluation               AM-PAC PT "6 Clicks" Daily Activity  Outcome Measure Difficulty turning over in bed (including adjusting bedclothes, sheets and blankets)?: A Little Difficulty moving from lying on back to sitting on the side of the bed? : A Lot Difficulty sitting down on and standing up from a chair with arms (e.g., wheelchair, bedside commode, etc,.)?: A Lot Help needed moving to and from a bed to chair (including a wheelchair)?: A Lot Help needed walking in hospital room?: Total Help needed climbing 3-5 steps with a railing? : Total 6 Click Score: 11    End of Session   Activity Tolerance: Patient limited by pain Patient left: in chair;with nursing/sitter in room (patient left in wheelchair to be take to 300 nursing  station) Nurse Communication: Mobility status PT Visit Diagnosis: Unsteadiness on feet (R26.81);Other abnormalities of gait and mobility (R26.89);Muscle weakness (generalized) (M62.81)    Time: 1829-9371 PT Time Calculation (min) (ACUTE ONLY): 32 min   Charges:   PT Evaluation $PT Eval Moderate Complexity: 1 Mod PT Treatments $Therapeutic Activity: 23-37 mins   PT G Codes:   PT G-Codes **NOT FOR INPATIENT CLASS** Functional Assessment Tool Used: AM-PAC 6 Clicks Basic Mobility Functional Limitation: Mobility: Walking and moving around Mobility: Walking and Moving Around Current Status ([id number]): At least 60 percent but less than 80 percent impaired, limited or restricted Mobility: Walking and Moving Around Goal Status [phone]): At least 60 percent but less than 80 percent impaired, limited or restricted Mobility: Walking and Moving Around Discharge Status [phone]): At least 60 percent but less than 80 percent impaired, limited or restricted    3:09 PM, 05/04/17 Lonell Grandchild, MPT Physical Therapist with [facility] [phone] office [phone] mobile phone

## 2017-05-04 NOTE — Progress Notes (Signed)
Hand-off communication given to Lauren, RN in preparation for transfer to room 334.

## 2017-05-04 NOTE — Progress Notes (Signed)
Transferred from recliner to bed.  Unable to support herself.

## 2017-05-04 NOTE — Plan of Care (Signed)
Problem: Pain Managment: Goal: General experience of comfort will improve Outcome: Progressing Patient still experienced severe pain in L knee. Voltaren cream is helpful and heat still helps. Pain is less than the night before. RN only has to treat pain once.  Problem: Skin Integrity: Goal: Risk for impaired skin integrity will decrease Outcome: Progressing Patient is still very itchy. RN advised patient to not scratch. Cream applied and benadryl given.

## 2017-05-04 NOTE — Progress Notes (Signed)
PROGRESS NOTE  [name]  PXT:[id number] DOB: [birth year] DOA: 04/30/2017 PCP: [name], MD  Brief Narrative:   [name] is a 83 y.o. female with history of hypertension, diabetes mellitus type 2, chronic diastolic heart failure, erythroderma desquamativum, colon cancer s/p resection in 2006, arthritis and anxiety who presented with unresponsiveness.  She was found to be hypoglycemic and was transferred to the ER where she was hypothermic.  She recently changed to [facility] and she also has a UTI.  She was hypoglycemic for several days and her CBG finally stabilized off of dextrose yesterday.  Overnight, however, she has developed a fever despite being on ceftriaxone for her UTI.    Assessment & Plan:   Active Problems:   Hypoglycemia  Hypoglycemia due to change in insulin and UTI, resolved -  TSH wnl  Fever, possibly due to atelectasis or aspiration pneumonia, however, CXR looks better than prior and area of concern is at the left base while the right is clear.   -  Hold off on further antibiotics -  IS -  Blood cultures -  Repeat UA and urine culture -  CXR  Enterobacter urinary tract infection present at time of admission -  d/c ceftriaxone  Hypothermia, likely secondary to hypoglycemia and resolved -  Blood cultures NGTD  Left calf pain and swelling, improved  -  Lower extremity duplex:  Peripheral vascular disease and Baker's cyst -  Continue tylenol and prn ibuprofen -  Continue voltaren gel -  Change oxycodone to ultram to reduce risk of sedation  Acute respiratory failure with hypoxia seconadry to acute on chronic diastolic heart failure, persistent wheezing and rales, edema on CXR -  lasix IV BID -  Daily weights -  Strict I/O   Hypertension, elevated blood pressures -  Continue labetalol and norvasc -  Add prn hydralazine  Asthma, stable -  duoneb now and prn albuterol  Anxiety, stable, continue xanax  HLD, chronic stable, continue  pravastatin  Rash, RN and pharmacist have thoroughly reviewed medications and discovered pravastatin can cause a psoriasis like skin rash - d/c'd pravastatin - continue steroid creams  DVT prophylaxis: lovenox  Code Status: DNR Family Communication:  Patient alone Disposition Plan:  diuresing, needs to transition off oxygen and work with PT.  Observing off antibiotics despite fever orvenight  Consultants:   none  Procedures:  none  Antimicrobials:  Anti-infectives    Start     Dose/Rate Route Frequency Ordered Stop   04/30/17 0800  cefTRIAXone (ROCEPHIN) 1 g in dextrose 5 % 50 mL IVPB  Status:  Discontinued     1 g 100 mL/hr over 30 Minutes Intravenous Every 24 hours 04/30/17 0743 05/04/17 0738       Subjective:  Feeling well. Had increased pain in the right anterior knee last night.  Received tylenol and ibuprofen without relief and was given oxycodone which caused her to fall asleep and be mildly hypoxic.  Still having some pain this morning.  Denies SOB, jitters/shakes, diaphoresis.  Eating well.    Objective: Vitals:   05/04/17 1005 05/04/17 1051 05/04/17 1200 05/04/17 1300  BP: (!) 157/70  (!) 171/53 (!) 164/66  Pulse:  81 86 85  Resp:  19 20 (!) 25  Temp:  98.8 F (37.1 C)    TempSrc:  Oral    SpO2:  98% 100% 93%  Weight:      Height:        Intake/Output Summary (Last 24  hours) at 05/04/17 1359 Last data filed at 05/03/17 2146  Gross per 24 hour  Intake              550 ml  Output             1750 ml  Net            -1200 ml   Filed Weights   05/02/17 0400 05/03/17 0423 05/04/17 0600  Weight: 80.5 kg (177 lb 7.5 oz) 79.2 kg (174 lb 9.7 oz) 78.1 kg (172 lb 2.9 oz)    Examination:  General exam:  Adult female.  No acute distress.  HEENT:  NCAT, MMM Respiratory system:  Wheezing with rales at the bilateral bases, no rhochi Cardiovascular system: Regular rate and rhythm, normal S1/S2, 2/6 murmur.  Warm extremities Gastrointestinal system: Normal  active bowel sounds, soft, nondistended, nontender. MSK:  Normal tone and bulk, 1+ pitting left  lower extremity edema, obvious arthritis of the bilateral knees, left > right.  Baker's cyst behind left knee Neuro:  Grossly intact Derm: Erythematous desquamating macular rash on trunk, arms, and legs but no obvious cellulitis/draining areas   Data Reviewed: I have personally reviewed following labs and imaging studies  CBC:  Recent Labs Lab 04/30/17 0637 05/01/17 0543 05/04/17 0841  WBC 8.9 8.0 9.7  NEUTROABS 7.6  --  6.0  HGB 11.6* 11.3* 12.3  HCT 35.3* 34.7* 37.3  MCV 89.8 91.1 89.7  PLT 252 246 993   Basic Metabolic Panel:  Recent Labs Lab 04/30/17 0637 05/01/17 0543 05/04/17 0841  NA 141 140 136  K 3.4* 4.1 3.8  CL 108 110 99*  CO2 26 21* 27  GLUCOSE 120* 78 192*  BUN 23* 20 30*  CREATININE 0.84 0.92 1.03*  CALCIUM 8.1* 8.3* 8.8*   GFR: Estimated Creatinine Clearance: 41.1 mL/min (A) (by C-G formula based on SCr of 1.03 mg/dL (H)). Liver Function Tests:  Recent Labs Lab 04/30/17 0637  AST 25  ALT 20  ALKPHOS 57  BILITOT 0.4  PROT 6.2*  ALBUMIN 2.9*   No results for input(s): LIPASE, AMYLASE in the last 168 hours. No results for input(s): AMMONIA in the last 168 hours. Coagulation Profile: No results for input(s): INR, PROTIME in the last 168 hours. Cardiac Enzymes:  Recent Labs Lab 04/30/17 0637  TROPONINI <0.03   BNP (last 3 results) No results for input(s): PROBNP in the last 8760 hours. HbA1C: No results for input(s): HGBA1C in the last 72 hours. CBG:  Recent Labs Lab 05/04/17 0200 05/04/17 0355 05/04/17 0559 05/04/17 0755 05/04/17 1053  GLUCAP 237* 205* 178* 181* 183*   Lipid Profile: No results for input(s): CHOL, HDL, LDLCALC, TRIG, CHOLHDL, LDLDIRECT in the last 72 hours. Thyroid Function Tests: No results for input(s): TSH, T4TOTAL, FREET4, T3FREE, THYROIDAB in the last 72 hours. Anemia Panel: No results for input(s):  VITAMINB12, FOLATE, FERRITIN, TIBC, IRON, RETICCTPCT in the last 72 hours. Urine analysis:    Component Value Date/Time   COLORURINE YELLOW 04/30/2017 0610   APPEARANCEUR HAZY (A) 04/30/2017 0610   LABSPEC 1.015 04/30/2017 0610   PHURINE 6.0 04/30/2017 0610   GLUCOSEU NEGATIVE 04/30/2017 0610   HGBUR NEGATIVE 04/30/2017 0610   BILIRUBINUR NEGATIVE 04/30/2017 0610   KETONESUR NEGATIVE 04/30/2017 0610   PROTEINUR 30 (A) 04/30/2017 0610   UROBILINOGEN 0.2 08/16/2010 2011   NITRITE POSITIVE (A) 04/30/2017 0610   LEUKOCYTESUR MODERATE (A) 04/30/2017 0610   Sepsis Labs: @LABRCNTIP (procalcitonin:4,lacticidven:4)  ) Recent Results (from the past  240 hour(s))  Culture, Urine     Status: Abnormal   Collection Time: 04/30/17  6:10 AM  Result Value Ref Range Status   Specimen Description URINE, CATHETERIZED  Final   Special Requests NONE  Final   Culture >=100,000 COLONIES/mL ENTEROBACTER AEROGENES (A)  Final   Report Status 05/02/2017 FINAL  Final   Organism ID, Bacteria ENTEROBACTER AEROGENES (A)  Final      Susceptibility   Enterobacter aerogenes - MIC*    CEFAZOLIN >=64 RESISTANT Resistant     CEFTRIAXONE <=1 SENSITIVE Sensitive     CIPROFLOXACIN <=0.25 SENSITIVE Sensitive     GENTAMICIN <=1 SENSITIVE Sensitive     IMIPENEM 1 SENSITIVE Sensitive     NITROFURANTOIN 64 INTERMEDIATE Intermediate     TRIMETH/SULFA <=20 SENSITIVE Sensitive     PIP/TAZO <=4 SENSITIVE Sensitive     * >=100,000 COLONIES/mL ENTEROBACTER AEROGENES  Culture, blood (routine x 2)     Status: None (Preliminary result)   Collection Time: 04/30/17  6:38 AM  Result Value Ref Range Status   Specimen Description BLOOD RIGHT ARM  Final   Special Requests   Final    BOTTLES DRAWN AEROBIC AND ANAEROBIC Blood Culture adequate volume   Culture NO GROWTH 4 DAYS  Final   Report Status PENDING  Incomplete  Culture, blood (routine x 2)     Status: None (Preliminary result)   Collection Time: 04/30/17  6:50 AM  Result  Value Ref Range Status   Specimen Description BLOOD RIGHT HAND  Final   Special Requests   Final    BOTTLES DRAWN AEROBIC ONLY Blood Culture adequate volume   Culture NO GROWTH 4 DAYS  Final   Report Status PENDING  Incomplete  MRSA PCR Screening     Status: None   Collection Time: 05/01/17  1:05 AM  Result Value Ref Range Status   MRSA by PCR NEGATIVE NEGATIVE Final    Comment:        The GeneXpert MRSA Assay (FDA approved for NASAL specimens only), is one component of a comprehensive MRSA colonization surveillance program. It is not intended to diagnose MRSA infection nor to guide or monitor treatment for MRSA infections.   Culture, blood (Routine X 2) w Reflex to ID Panel     Status: None (Preliminary result)   Collection Time: 05/04/17  8:19 AM  Result Value Ref Range Status   Specimen Description RIGHT ANTECUBITAL  Final   Special Requests   Final    BOTTLES DRAWN AEROBIC AND ANAEROBIC Blood Culture adequate volume   Culture PENDING  Incomplete   Report Status PENDING  Incomplete  Culture, blood (Routine X 2) w Reflex to ID Panel     Status: None (Preliminary result)   Collection Time: 05/04/17  8:41 AM  Result Value Ref Range Status   Specimen Description BLOOD RIGHT HAND  Final   Special Requests   Final    BOTTLES DRAWN AEROBIC ONLY Blood Culture results may not be optimal due to an inadequate volume of blood received in culture bottles   Culture PENDING  Incomplete   Report Status PENDING  Incomplete      Radiology Studies: Dg Chest Port 1 View  Result Date: 05/04/2017 CLINICAL DATA:  Fever. EXAM: PORTABLE CHEST 1 VIEW COMPARISON:  Chest x-ray dated April 30, 2017. FINDINGS: Mild cardiomegaly and pulmonary vascular congestion. Low lung volumes with persistent increased opacity at the left lung base. No large pleural effusion. No pneumothorax. No acute osseous abnormality. IMPRESSION:  Cardiomegaly and pulmonary vascular congestion. Persistent increased opacity at  the left lung base, which could reflect atelectasis or pneumonia. Electronically Signed   By: Titus Dubin M.D.   On: 05/04/2017 10:19     Scheduled Meds: . albuterol  2.5 mg Nebulization BID  . ALPRAZolam  0.5 mg Oral TID  . amLODipine  5 mg Oral Daily  . diclofenac sodium  4 g Topical QID  . docusate sodium  100 mg Oral BID  . enoxaparin (LOVENOX) injection  40 mg Subcutaneous Q24H  . furosemide  40 mg Oral BID  . insulin aspart  0-5 Units Subcutaneous QHS  . insulin aspart  0-9 Units Subcutaneous TID WC  . prednisoLONE acetate  1 drop Both Eyes QID  . triamcinolone cream   Topical BID   Continuous Infusions:    LOS: 0 days    Time spent: 30 min    Janece Canterbury, MD [facility] Pager [phone]  If 7PM-7AM, please contact night-coverage [email or web address] Password [email or web address] 05/04/2017, 1:59 PM

## 2017-05-05 LAB — BLOOD CULTURE ID PANEL (REFLEXED)
Acinetobacter baumannii: NOT DETECTED
CANDIDA ALBICANS: NOT DETECTED
CANDIDA GLABRATA: NOT DETECTED
CANDIDA TROPICALIS: NOT DETECTED
Candida krusei: NOT DETECTED
Candida parapsilosis: NOT DETECTED
ENTEROBACTER CLOACAE COMPLEX: NOT DETECTED
ENTEROBACTERIACEAE SPECIES: NOT DETECTED
Enterococcus species: NOT DETECTED
Escherichia coli: NOT DETECTED
Haemophilus influenzae: NOT DETECTED
KLEBSIELLA PNEUMONIAE: NOT DETECTED
Klebsiella oxytoca: NOT DETECTED
Listeria monocytogenes: NOT DETECTED
Methicillin resistance: DETECTED — AB
NEISSERIA MENINGITIDIS: NOT DETECTED
Proteus species: NOT DETECTED
Pseudomonas aeruginosa: NOT DETECTED
STREPTOCOCCUS PNEUMONIAE: NOT DETECTED
STREPTOCOCCUS PYOGENES: NOT DETECTED
STREPTOCOCCUS SPECIES: NOT DETECTED
Serratia marcescens: NOT DETECTED
Staphylococcus aureus (BCID): NOT DETECTED
Staphylococcus species: DETECTED — AB
Streptococcus agalactiae: NOT DETECTED

## 2017-05-05 LAB — GLUCOSE, CAPILLARY
GLUCOSE-CAPILLARY: 219 mg/dL — AB (ref 65–99)
GLUCOSE-CAPILLARY: 243 mg/dL — AB (ref 65–99)
GLUCOSE-CAPILLARY: 326 mg/dL — AB (ref 65–99)
Glucose-Capillary: 190 mg/dL — ABNORMAL HIGH (ref 65–99)

## 2017-05-05 LAB — BASIC METABOLIC PANEL
ANION GAP: 10 (ref 5–15)
BUN: 31 mg/dL — ABNORMAL HIGH (ref 6–20)
CO2: 28 mmol/L (ref 22–32)
Calcium: 8.7 mg/dL — ABNORMAL LOW (ref 8.9–10.3)
Chloride: 99 mmol/L — ABNORMAL LOW (ref 101–111)
Creatinine, Ser: 1.06 mg/dL — ABNORMAL HIGH (ref 0.44–1.00)
GFR calc Af Amer: 54 mL/min — ABNORMAL LOW (ref >60)
GFR, EST NON AFRICAN AMERICAN: 47 mL/min — AB (ref >60)
Glucose, Bld: 209 mg/dL — ABNORMAL HIGH (ref 65–99)
POTASSIUM: 3.7 mmol/L (ref 3.5–5.1)
SODIUM: 137 mmol/L (ref 135–145)

## 2017-05-05 LAB — CULTURE, BLOOD (ROUTINE X 2)
CULTURE: NO GROWTH
Culture: NO GROWTH
Special Requests: ADEQUATE
Special Requests: ADEQUATE

## 2017-05-05 LAB — CBC
HEMATOCRIT: 35.4 % — AB (ref 36.0–46.0)
HEMOGLOBIN: 11.3 g/dL — AB (ref 12.0–15.0)
MCH: 28.8 pg (ref 26.0–34.0)
MCHC: 31.9 g/dL (ref 30.0–36.0)
MCV: 90.1 fL (ref 78.0–100.0)
Platelets: 284 10*3/uL (ref 150–400)
RBC: 3.93 MIL/uL (ref 3.87–5.11)
RDW: 13.8 % (ref 11.5–15.5)
WBC: 7.8 10*3/uL (ref 4.0–10.5)

## 2017-05-05 MED ORDER — VANCOMYCIN HCL IN DEXTROSE 1-5 GM/200ML-% IV SOLN: 1000.0000 mg | INTRAVENOUS | Status: DC

## 2017-05-05 MED ORDER — VANCOMYCIN HCL 10 G IV SOLR
1500.0000 mg | Freq: Once | INTRAVENOUS | Status: DC
  Administered 2017-05-05: 1500 mg via INTRAVENOUS
  Filled 2017-05-05: qty 1500

## 2017-05-05 NOTE — Clinical Social Work Note (Signed)
Clinical Social Work Assessment  Patient Details  Name: [name] MRN: [id number] Date of Birth: 06-Aug-1930  Date of referral:  05/05/17               Reason for consult:  Facility Placement                Permission sought to share information with:  Facility Sport and exercise psychologist Permission granted to share information::     Name::        Agency::     Relationship::  Daughter June was at bedside  Contact Information:  Patient asked CSW to call her grandaughter [name]  Housing/Transportation Living arrangements for the past 2 months:  [place] of Information:  Patient, Adult Children, Other (Comment Required) Tour manager) Patient Interpreter Needed:  None Criminal Activity/Legal Involvement Pertinent to Current Situation/Hospitalization:  No - Comment as needed Significant Relationships:  Adult Children, Other Family Members Lives with:  Self (Grandaughter and [name] spend the night ) Do you feel safe going back to the place where you live?  Yes Need for family participation in patient care:  Yes (Comment)  Care giving concerns: Pt does not identify any care giving concerns.   Social Worker assessment / plan: Pt is alert and oriented at the time of CSW visit. She reports that she lives alone during the day in a two story home in [place]. Pt reports that she has five steps to enter the home. Pt does not use the upstairs level of her home. Pt's bedroom and bathroom are on the main level. Pt has been ambulating with a cane at home prior to admission. Pt states that she has been independent in ADL's prior to admission. Pt does not drive. Pt appears to have extensive family support. Pt's daughter June was present at bedside with pt's permission at the time of CSW visit. Pt's granddaughter and great grandson stay overnight with patient every night. Dr. [place] Crane is pt's PCP and pt has an advantage medicare plan for insurance. Pt does not drive. Her family  will transport her or arrange for medical transport if necessary. PT recommending SNF for short term rehab. CSW discussed this recommendation with pt and her daughter. Pt is reluctantly agreeable to this idea and she asked CSW to contact her granddaughter, [name], for referral choices. SNF list left in pt's room for pt/family reference. CSW did speak with [name] by phone and she requested referrals to [facility] and [facility] SNF. Referrals initiated. Will follow and continue to assist with dc planning.  Employment status:  Retired Nurse, adult PT Recommendations:  [place], [place] / Referral to community resources:  [place]  Patient/Family's Response to care: Pt agreeable but wants her granddaughter to direct the plan.  Patient/Family's Understanding of and Emotional Response to Diagnosis, Current Treatment, and Prognosis: Pt and family aware of current diagnosis and treatment recommendations. Pt/family agree that short term SNF rehab is appropriate.  Emotional Assessment Appearance:  Appears stated age Attitude/Demeanor/Rapport:    Affect (typically observed):  Accepting, Calm, Pleasant Orientation:  Oriented to Self, Oriented to Place, Oriented to  Time, Oriented to Situation Alcohol / Substance use:  Not Applicable Psych involvement (Current and /or in the community):  No (Comment)  Discharge Needs  Concerns to be addressed:  Discharge Planning Concerns Readmission within the last 30 days:  No Current discharge risk:  Lives alone Barriers to Discharge:  No Barriers Identified  Shade Flood, LCSW 05/05/2017, 12:07 PM

## 2017-05-05 NOTE — Clinical Social Work Placement (Signed)
   CLINICAL SOCIAL WORK PLACEMENT  NOTE  Date:  05/05/2017  Patient Details  Name: [name] MRN: [id number] Date of Birth: 1931-02-25  Clinical Social Work is seeking post-discharge placement for this patient at the [place] level of care (*CSW will initial, date and re-position this form in  chart as items are completed):  Yes   Patient/family provided with [facility] Work Department's list of facilities offering this level of care within the geographic area requested by the patient (or if unable, by the patient's family).  Yes   Patient/family informed of their freedom to choose among providers that offer the needed level of care, that participate in Medicare, Medicaid or managed care program needed by the patient, have an available bed and are willing to accept the patient.  Yes   Patient/family informed of [facility]'s ownership interest in [facility] and [facility], as well as of the fact that they are under no obligation to receive care at these facilities.  PASRR submitted to EDS on 05/05/17     PASRR number received on       Existing PASRR number confirmed on       FL2 transmitted to all facilities in geographic area requested by pt/family on 05/05/17     FL2 transmitted to all facilities within larger geographic area on       Patient informed that his/her managed care company has contracts with or will negotiate with certain facilities, including the following:            Patient/family informed of bed offers received.  Patient chooses bed at       Physician recommends and patient chooses bed at      Patient to be transferred to   on  .  Patient to be transferred to facility by       Patient family notified on   of transfer.  Name of family member notified:        PHYSICIAN       Additional Comment:    _______________________________________________ Shade Flood, LCSW 05/05/2017, 12:17 PM

## 2017-05-05 NOTE — NC FL2 (Signed)
[place] LEVEL OF CARE SCREENING TOOL     IDENTIFICATION  Patient Name: [name] Birthdate: 05-08-1931 Sex: female Admission Date (Current Location): 04/30/2017  [facility] and [place] Number:  [employer] and Address:  [place] [address] [place]      Provider Number: [id number]  Attending Physician Name and Address:  Janece Canterbury, MD  Relative Name and Phone Number:       Current Level of Care: Hospital Recommended Level of Care: [place] Prior Approval Number:    Date Approved/Denied:   PASRR Number:    Discharge Plan: SNF    Current Diagnoses: Patient Active Problem List   Diagnosis Date Noted  . Acute respiratory failure ([place]) 05/04/2017  . Hypoglycemia 04/30/2017  . Acute urinary retention 02/23/2017  . Chronic diastolic CHF (congestive heart failure) ([place])   . SBO (small bowel obstruction) ([place]) 02/22/2017  . Hypertensive urgency 02/22/2017  . Leukocytosis 02/22/2017  . Hyponatremia 02/22/2017  . Dehydration 02/22/2017  . Dizziness 09/01/2016  . Weakness 09/01/2016  . Psoriasis vulgaris 09/01/2016  . Right hip pain 09/01/2016  . Diabetic foot ulcer ([place]) 09/01/2016  . Dyspnea 05/15/2016  . Congestive heart failure (CHF) ([place]) 05/15/2016  . Rash and nonspecific skin eruption 05/15/2016  . Essential hypertension 05/15/2016  . Diabetes mellitus type 2 in obese ([place]) 05/15/2016  . Anxiety 05/15/2016  . Acute respiratory failure with hypoxia ([facility]) 05/15/2016    Orientation RESPIRATION BLADDER Height & Weight     Self, Time, Situation, Place  Normal Continent Weight: 168 lb (76.2 kg) Height:  5\' 4"  (162.6 cm)  BEHAVIORAL SYMPTOMS/MOOD NEUROLOGICAL BOWEL NUTRITION STATUS      Continent Diet (Carb Modified)  AMBULATORY STATUS COMMUNICATION OF NEEDS Skin   Extensive Assist Verbally Normal                       Personal Care Assistance Level of Assistance  Bathing,  Feeding, Dressing Bathing Assistance: Limited assistance Feeding assistance: Independent Dressing Assistance: Limited assistance     Functional Limitations Info  Sight, Hearing, Speech Sight Info: Adequate Hearing Info: Impaired (HOH) Speech Info: Adequate    SPECIAL CARE FACTORS FREQUENCY  PT (By licensed PT)     PT Frequency: 5 times a week              Contractures Contractures Info: Not present    Additional Factors Info  Code Status, Allergies, Psychotropic, Insulin Sliding Scale Code Status Info: DNR Allergies Info: ACE Inhibitors, Glipizide, Neosporin, Spironolactone, Diovan Psychotropic Info: Xanax         Current Medications (05/05/2017):  This is the current hospital active medication list Current Facility-Administered Medications  Medication Dose Route Frequency Provider Last Rate Last Dose  . acetaminophen (TYLENOL) tablet 650 mg  650 mg Oral Q6H PRN Janece Canterbury, MD   650 mg at 05/04/17 0006   Or  . acetaminophen (TYLENOL) suppository 650 mg  650 mg Rectal Q6H PRN Short, Noah Delaine, MD      . albuterol (PROVENTIL) (2.5 MG/3ML) 0.083% nebulizer solution 2.5 mg  2.5 mg Nebulization Q4H PRN Janece Canterbury, MD   2.5 mg at 05/02/17 1123  . ALPRAZolam Duanne Moron) tablet 0.5 mg  0.5 mg Oral TID Janece Canterbury, MD   0.5 mg at 05/05/17 1051  . amLODipine (NORVASC) tablet 5 mg  5 mg Oral Daily Short, Mackenzie, MD   5 mg at 05/05/17 1051  . diclofenac sodium (  VOLTAREN) 1 % transdermal gel 4 g  4 g Topical QID Janece Canterbury, MD   4 g at 05/05/17 1049  . diphenhydrAMINE (BENADRYL) capsule 25 mg  25 mg Oral Q4H PRN Schorr, Rhetta Mura, NP   25 mg at 05/04/17 2041  . docusate sodium (COLACE) capsule 100 mg  100 mg Oral BID Janece Canterbury, MD   100 mg at 05/05/17 1051  . enoxaparin (LOVENOX) injection 40 mg  40 mg Subcutaneous Q24H Short, Mackenzie, MD   40 mg at 05/05/17 1049  . furosemide (LASIX) injection 40 mg  40 mg Intravenous BID Janece Canterbury, MD   40 mg  at 05/05/17 1051  . glucagon (human recombinant) (GLUCAGEN) injection 1 mg  1 mg Intravenous Once PRN Short, Mackenzie, MD      . hydrALAZINE (APRESOLINE) injection 10 mg  10 mg Intravenous Q4H PRN Short, Mackenzie, MD      . ibuprofen (ADVIL,MOTRIN) tablet 400 mg  400 mg Oral Q8H PRN Janece Canterbury, MD   400 mg at 05/04/17 1413  . insulin aspart (novoLOG) injection 0-5 Units  0-5 Units Subcutaneous Hettie Holstein, MD   3 Units at 05/04/17 2200  . insulin aspart (novoLOG) injection 0-9 Units  0-9 Units Subcutaneous TID WC Janece Canterbury, MD   2 Units at 05/05/17 [phone]  . ondansetron (ZOFRAN) tablet 4 mg  4 mg Oral Q6H PRN Janece Canterbury, MD   4 mg at 05/02/17 1218   Or  . ondansetron (ZOFRAN) injection 4 mg  4 mg Intravenous Q6H PRN Short, Mackenzie, MD      . prednisoLONE acetate (PRED FORTE) 1 % ophthalmic suspension 1 drop  1 drop Both Eyes QID Janece Canterbury, MD   1 drop at 05/05/17 1048  . traMADol (ULTRAM) tablet 50 mg  50 mg Oral Q6H PRN Janece Canterbury, MD   50 mg at 05/04/17 2002  . triamcinolone cream (KENALOG) 0.1 %   Topical BID Short, Mackenzie, MD      . vancomycin (VANCOCIN) 1,500 mg in sodium chloride 0.9 % 500 mL IVPB  1,500 mg Intravenous Once Janece Canterbury, MD       Followed by  . [START ON 05/06/2017] vancomycin (VANCOCIN) IVPB 1000 mg/200 mL premix  1,000 mg Intravenous Q24H Janece Canterbury, MD         Discharge Medications: Please see discharge summary for a list of discharge medications.  Relevant Imaging Results:  Relevant Lab Results:   Additional Information SSN: [id number]  Shade Flood, LCSW

## 2017-05-05 NOTE — Progress Notes (Signed)
PROGRESS NOTE  [name]  WER:[id number] DOB: [birth year] DOA: 04/30/2017 PCP: [name], MD  Brief Narrative:   [name] is a 83 y.o. female with history of hypertension, diabetes mellitus type 2, chronic diastolic heart failure, erythroderma desquamativum, colon cancer s/p resection in 2006, arthritis and anxiety who presented with unresponsiveness.  She was found to be hypoglycemic and was transferred to the ER where she was hypothermic.  She recently changed to [facility] and she also has a UTI.  She was hypoglycemic for several days and her CBG finally stabilized off of dextrose yesterday.  Overnight, however, she has developed a fever despite being on ceftriaxone for her UTI.    Assessment & Plan:   Active Problems:   Hypoglycemia   Acute respiratory failure (HCC)  Hypoglycemia due to change in insulin and UTI, resolved -  TSH wnl  Fever, possibly due to atelectasis or aspiration pneumonia, however, CXR looks better than prior and area of concern is at the left base while the right is clear.  May also have been drug fever since fever resolved after ceftriaxone discontinued.  Even though she was on appropriate therapy for UTI, her UA looks much worse on repeat.  May have resistant infection.   -  Hold off on further antibiotics -  IS -  1 of 2 blood cultures positive for CONS, likely contaminant -  UA  With TNTC WBC  -  F/u urine culture -  CXR:  Evidence of CHF and opacity at the left base which could reflect atelectasis or pneumonia  Enterobacter urinary tract infection present at time of admission, completed 5 days of treatment.    Hypothermia, likely secondary to hypoglycemia and resolved -  Blood cultures from admission NGTD  Left calf pain and swelling, improved.  TTP along patellar tendon but chronic problem per patient, not worse than it's been at home.  She can still move knee.  Doubt septic arthritis.    -  Lower extremity duplex:  Peripheral vascular  disease and Baker's cyst -  Continue tylenol and prn ibuprofen -  Continue voltaren gel -  Continue prn ultram  Acute respiratory failure with hypoxia seconadry to acute on chronic diastolic heart failure, persistent wheezing and rales, edema on CXR -  continue lasix IV BID -  Wt down 2kg -  I/O not strictly recorded, but reportedly voided a lot yesterday.  Now has purewick for better measurement  Hypertension, elevated blood pressures -  Continue labetalol and norvasc -  Add prn hydralazine  Asthma, stable -  duoneb now and prn albuterol  Anxiety, stable, continue xanax  HLD, chronic stable, continue pravastatin  Rash, RN and pharmacist have thoroughly reviewed medications and discovered pravastatin can cause a psoriasis like skin rash.   -  Needs dermatology follow up - d/c'd pravastatin - continue steroid creams  DVT prophylaxis: lovenox  Code Status: DNR Family Communication:  Patient alone Disposition Plan:  diuresing, needs to transition off oxygen and work with PT.  Observing off antibiotics despite fever orvenight  Consultants:   none  Procedures:  none  Antimicrobials:  Anti-infectives    Start     Dose/Rate Route Frequency Ordered Stop   05/06/17 1100  vancomycin (VANCOCIN) IVPB 1000 mg/200 mL premix  Status:  Discontinued     1,000 mg 200 mL/hr over 60 Minutes Intravenous Every 24 hours 05/05/17 1023 05/05/17 1441   05/05/17 1100  vancomycin (VANCOCIN) 1,500 mg in sodium chloride 0.9 %  500 mL IVPB  Status:  Discontinued     1,500 mg 250 mL/hr over 120 Minutes Intravenous  Once 05/05/17 1023 05/05/17 1518   04/30/17 0800  cefTRIAXone (ROCEPHIN) 1 g in dextrose 5 % 50 mL IVPB  Status:  Discontinued     1 g 100 mL/hr over 30 Minutes Intravenous Every 24 hours 04/30/17 0743 05/04/17 0738       Subjective:  Feeling better but her knee still hurts.  Still wheezing some and having a cough.  Denies chest pains, vomiting, diarrhea.     Objective: Vitals:   05/04/17 2048 05/04/17 2107 05/05/17 0616 05/05/17 1400  BP:  (!) 165/53 (!) 167/71 (!) 157/59  Pulse:  77 84 79  Resp:  20 18 18   Temp:  98.5 F (36.9 C) 98.3 F (36.8 C) 98.3 F (36.8 C)  TempSrc:  Oral Oral   SpO2: 95% 96% 94% 92%  Weight:   76.2 kg (168 lb)   Height:        Intake/Output Summary (Last 24 hours) at 05/05/17 1733 Last data filed at 05/05/17 1300  Gross per 24 hour  Intake             1460 ml  Output             1000 ml  Net              460 ml   Filed Weights   05/03/17 0423 05/04/17 0600 05/05/17 0616  Weight: 79.2 kg (174 lb 9.7 oz) 78.1 kg (172 lb 2.9 oz) 76.2 kg (168 lb)    Examination:  General exam:  Adult female.  No acute distress.  HEENT:  NCAT, MMM Respiratory system:  Wheezing with rales at the bilateral bases, no rhonchi Cardiovascular system: Regular rate and rhythm, normal S1/S2. No murmurs, rubs, gallops or clicks.  Warm extremities Gastrointestinal system: Normal active bowel sounds, soft, nondistended, nontender. MSK:  Normal tone and bulk,  1+ pitting left  lower extremity edema, obvious arthritis of the bilateral knees, left > right.  Baker's cyst behind left knee.  Slightly more erythema over knee but has a warm compress on it.   Neuro:  Grossly intact Derm: Erythematous desquamating macular rash on trunk, arms, and legs but no obvious cellulitis/draining areas   Data Reviewed: I have personally reviewed following labs and imaging studies  CBC:  Recent Labs Lab 04/30/17 0637 05/01/17 0543 05/04/17 0841 05/05/17 0551  WBC 8.9 8.0 9.7 7.8  NEUTROABS 7.6  --  6.0  --   HGB 11.6* 11.3* 12.3 11.3*  HCT 35.3* 34.7* 37.3 35.4*  MCV 89.8 91.1 89.7 90.1  PLT 252 246 274 254   Basic Metabolic Panel:  Recent Labs Lab 04/30/17 0637 05/01/17 0543 05/04/17 0841 05/05/17 0551  NA 141 140 136 137  K 3.4* 4.1 3.8 3.7  CL 108 110 99* 99*  CO2 26 21* 27 28  GLUCOSE 120* 78 192* 209*  BUN 23* 20 30* 31*   CREATININE 0.84 0.92 1.03* 1.06*  CALCIUM 8.1* 8.3* 8.8* 8.7*   GFR: Estimated Creatinine Clearance: 39.5 mL/min (A) (by C-G formula based on SCr of 1.06 mg/dL (H)). Liver Function Tests:  Recent Labs Lab 04/30/17 0637  AST 25  ALT 20  ALKPHOS 57  BILITOT 0.4  PROT 6.2*  ALBUMIN 2.9*   No results for input(s): LIPASE, AMYLASE in the last 168 hours. No results for input(s): AMMONIA in the last 168 hours. Coagulation Profile: No results for  input(s): INR, PROTIME in the last 168 hours. Cardiac Enzymes:  Recent Labs Lab 04/30/17 0637  TROPONINI <0.03   BNP (last 3 results) No results for input(s): PROBNP in the last 8760 hours. HbA1C: No results for input(s): HGBA1C in the last 72 hours. CBG:  Recent Labs Lab 05/04/17 1627 05/04/17 2106 05/05/17 0752 05/05/17 1159 05/05/17 1702  GLUCAP 227* 265* 190* 219* 243*   Lipid Profile: No results for input(s): CHOL, HDL, LDLCALC, TRIG, CHOLHDL, LDLDIRECT in the last 72 hours. Thyroid Function Tests: No results for input(s): TSH, T4TOTAL, FREET4, T3FREE, THYROIDAB in the last 72 hours. Anemia Panel: No results for input(s): VITAMINB12, FOLATE, FERRITIN, TIBC, IRON, RETICCTPCT in the last 72 hours. Urine analysis:    Component Value Date/Time   COLORURINE AMBER (A) 05/04/2017 0736   APPEARANCEUR TURBID (A) 05/04/2017 0736   LABSPEC 1.020 05/04/2017 0736   PHURINE 6.0 05/04/2017 0736   GLUCOSEU NEGATIVE 05/04/2017 0736   HGBUR NEGATIVE 05/04/2017 0736   BILIRUBINUR NEGATIVE 05/04/2017 0736   KETONESUR NEGATIVE 05/04/2017 0736   PROTEINUR 30 (A) 05/04/2017 0736   UROBILINOGEN 0.2 08/16/2010 2011   NITRITE NEGATIVE 05/04/2017 0736   LEUKOCYTESUR LARGE (A) 05/04/2017 0736   Sepsis Labs: @LABRCNTIP (procalcitonin:4,lacticidven:4)  ) Recent Results (from the past 240 hour(s))  Culture, Urine     Status: Abnormal   Collection Time: 04/30/17  6:10 AM  Result Value Ref Range Status   Specimen Description URINE,  CATHETERIZED  Final   Special Requests NONE  Final   Culture >=100,000 COLONIES/mL ENTEROBACTER AEROGENES (A)  Final   Report Status 05/02/2017 FINAL  Final   Organism ID, Bacteria ENTEROBACTER AEROGENES (A)  Final      Susceptibility   Enterobacter aerogenes - MIC*    CEFAZOLIN >=64 RESISTANT Resistant     CEFTRIAXONE <=1 SENSITIVE Sensitive     CIPROFLOXACIN <=0.25 SENSITIVE Sensitive     GENTAMICIN <=1 SENSITIVE Sensitive     IMIPENEM 1 SENSITIVE Sensitive     NITROFURANTOIN 64 INTERMEDIATE Intermediate     TRIMETH/SULFA <=20 SENSITIVE Sensitive     PIP/TAZO <=4 SENSITIVE Sensitive     * >=100,000 COLONIES/mL ENTEROBACTER AEROGENES  Culture, blood (routine x 2)     Status: None   Collection Time: 04/30/17  6:38 AM  Result Value Ref Range Status   Specimen Description BLOOD RIGHT ARM  Final   Special Requests   Final    BOTTLES DRAWN AEROBIC AND ANAEROBIC Blood Culture adequate volume   Culture NO GROWTH 5 DAYS  Final   Report Status 05/05/2017 FINAL  Final  Culture, blood (routine x 2)     Status: None   Collection Time: 04/30/17  6:50 AM  Result Value Ref Range Status   Specimen Description BLOOD RIGHT HAND  Final   Special Requests   Final    BOTTLES DRAWN AEROBIC ONLY Blood Culture adequate volume   Culture NO GROWTH 5 DAYS  Final   Report Status 05/05/2017 FINAL  Final  MRSA PCR Screening     Status: None   Collection Time: 05/01/17  1:05 AM  Result Value Ref Range Status   MRSA by PCR NEGATIVE NEGATIVE Final    Comment:        The GeneXpert MRSA Assay (FDA approved for NASAL specimens only), is one component of a comprehensive MRSA colonization surveillance program. It is not intended to diagnose MRSA infection nor to guide or monitor treatment for MRSA infections.   Culture, blood (Routine X 2) w Reflex  to ID Panel     Status: None (Preliminary result)   Collection Time: 05/04/17  8:19 AM  Result Value Ref Range Status   Specimen Description RIGHT  ANTECUBITAL  Final   Special Requests   Final    BOTTLES DRAWN AEROBIC AND ANAEROBIC Blood Culture adequate volume   Culture NO GROWTH < 24 HOURS  Final   Report Status PENDING  Incomplete  Culture, blood (Routine X 2) w Reflex to ID Panel     Status: None (Preliminary result)   Collection Time: 05/04/17  8:41 AM  Result Value Ref Range Status   Specimen Description BLOOD RIGHT HAND  Final   Special Requests   Final    BOTTLES DRAWN AEROBIC ONLY Blood Culture results may not be optimal due to an inadequate volume of blood received in culture bottles   Culture  Setup Time   Final    GRAM POSITIVE COCCI Gram Stain Report Called to,Read Back By and Verified With: COVINGTON L @ 3500 ON [id number] BY HENDERSON L. Organism ID to follow CRITICAL RESULT CALLED TO, READ BACK BY AND VERIFIED WITH: LIrma Newness.D. 14:30 05/05/17  ([email or web address]) Performed at [facility], [place] [address] [place], [place] [address]    Culture GRAM POSITIVE COCCI  Final   Report Status PENDING  Incomplete  Blood Culture ID Panel (Reflexed)     Status: Abnormal   Collection Time: 05/04/17  8:41 AM  Result Value Ref Range Status   Enterococcus species NOT DETECTED NOT DETECTED Final   Listeria monocytogenes NOT DETECTED NOT DETECTED Final   Staphylococcus species DETECTED (A) NOT DETECTED Final    Comment: Methicillin (oxacillin) resistant coagulase negative staphylococcus. Possible blood culture contaminant (unless isolated from more than one blood culture draw or clinical case suggests pathogenicity). No antibiotic treatment is indicated for blood  culture contaminants. CRITICAL RESULT CALLED TO, READ BACK BY AND VERIFIED WITH: LIrma Newness.D. 14:30 05/05/17 (wilsonm)    Staphylococcus aureus NOT DETECTED NOT DETECTED Final   Methicillin resistance DETECTED (A) NOT DETECTED Final    Comment: CRITICAL RESULT CALLED TO, READ BACK BY AND VERIFIED WITH: LIrma Newness.D. 14:30 05/05/17 (wilsonm)    Streptococcus  species NOT DETECTED NOT DETECTED Final   Streptococcus agalactiae NOT DETECTED NOT DETECTED Final   Streptococcus pneumoniae NOT DETECTED NOT DETECTED Final   Streptococcus pyogenes NOT DETECTED NOT DETECTED Final   Acinetobacter baumannii NOT DETECTED NOT DETECTED Final   Enterobacteriaceae species NOT DETECTED NOT DETECTED Final   Enterobacter cloacae complex NOT DETECTED NOT DETECTED Final   Escherichia coli NOT DETECTED NOT DETECTED Final   Klebsiella oxytoca NOT DETECTED NOT DETECTED Final   Klebsiella pneumoniae NOT DETECTED NOT DETECTED Final   Proteus species NOT DETECTED NOT DETECTED Final   Serratia marcescens NOT DETECTED NOT DETECTED Final   Haemophilus influenzae NOT DETECTED NOT DETECTED Final   Neisseria meningitidis NOT DETECTED NOT DETECTED Final   Pseudomonas aeruginosa NOT DETECTED NOT DETECTED Final   Candida albicans NOT DETECTED NOT DETECTED Final   Candida glabrata NOT DETECTED NOT DETECTED Final   Candida krusei NOT DETECTED NOT DETECTED Final   Candida parapsilosis NOT DETECTED NOT DETECTED Final   Candida tropicalis NOT DETECTED NOT DETECTED Final    Comment: Performed at [facility], [place] [address], [place], [place] [address]  Culture, blood (Routine X 2) w Reflex to ID Panel     Status: None (Preliminary result)   Collection Time: 05/05/17 12:00 PM  Result Value Ref Range Status   Specimen Description BLOOD RIGHT HAND  Final   Special Requests   Final    BOTTLES DRAWN AEROBIC AND ANAEROBIC Blood Culture adequate volume   Culture PENDING  Incomplete   Report Status PENDING  Incomplete  Culture, blood (Routine X 2) w Reflex to ID Panel     Status: None (Preliminary result)   Collection Time: 05/05/17 12:20 PM  Result Value Ref Range Status   Specimen Description BLOOD LEFT HAND  Final   Special Requests   Final    BOTTLES DRAWN AEROBIC AND ANAEROBIC Blood Culture adequate volume   Culture PENDING  Incomplete   Report Status PENDING  Incomplete       Radiology Studies: Dg Chest Port 1 View  Result Date: 05/04/2017 CLINICAL DATA:  Fever. EXAM: PORTABLE CHEST 1 VIEW COMPARISON:  Chest x-ray dated April 30, 2017. FINDINGS: Mild cardiomegaly and pulmonary vascular congestion. Low lung volumes with persistent increased opacity at the left lung base. No large pleural effusion. No pneumothorax. No acute osseous abnormality. IMPRESSION: Cardiomegaly and pulmonary vascular congestion. Persistent increased opacity at the left lung base, which could reflect atelectasis or pneumonia. Electronically Signed   By: Titus Dubin M.D.   On: 05/04/2017 10:19     Scheduled Meds: . ALPRAZolam  0.5 mg Oral TID  . amLODipine  5 mg Oral Daily  . diclofenac sodium  4 g Topical QID  . docusate sodium  100 mg Oral BID  . enoxaparin (LOVENOX) injection  40 mg Subcutaneous Q24H  . furosemide  40 mg Intravenous BID  . insulin aspart  0-5 Units Subcutaneous QHS  . insulin aspart  0-9 Units Subcutaneous TID WC  . prednisoLONE acetate  1 drop Both Eyes QID  . triamcinolone cream   Topical BID   Continuous Infusions:    LOS: 1 day    Time spent: 30 min    Janece Canterbury, MD [facility] Pager [phone]  If 7PM-7AM, please contact night-coverage [email or web address] Password [facility] 05/05/2017, 5:33 PM

## 2017-05-05 NOTE — Clinical Social Work Note (Signed)
Patient's PASRR number is [id number] A.   Hae Ahlers, Clydene Pugh, LCSW

## 2017-05-05 NOTE — Progress Notes (Addendum)
Pharmacy Antibiotic Note  [name] is a 83 y.o. female admitted on 04/30/2017 with bacteremia.  Pharmacy has been consulted for Vancomycin dosing.  Plan: Vancomycin 1500mg  loading dose, then 1000mg  IV every 24 hours.  Goal trough 15-20 mcg/mL.  F/U cxs and clinical progress  Height: 5\' 4"  (162.6 cm) Weight: 168 lb (76.2 kg) IBW/kg (Calculated) : 54.7  Temp (24hrs), Avg:98.5 F (36.9 C), Min:98.3 F (36.8 C), Max:98.8 F (37.1 C)   Recent Labs Lab 04/30/17 0637 04/30/17 0706 05/01/17 0543 05/04/17 0841 05/05/17 0551  WBC 8.9  --  8.0 9.7 7.8  CREATININE 0.84  --  0.92 1.03* 1.06*  LATICACIDVEN  --  0.92  --   --   --     Estimated Creatinine Clearance: 39.5 mL/min (A) (by C-G formula based on SCr of 1.06 mg/dL (H)).    Allergies  Allergen Reactions  . Ace Inhibitors Dermatitis  . Glipizide     rash  . Neosporin [Neomycin-Bacitracin Zn-Polymyx] Dermatitis  . Spironolactone     rash  . Diovan [Valsartan] Rash    Antimicrobials this admission: Vancomycin 10/23  >>  Ceftriaxone 10/18>> 10/22  Microbiology results: 10/18 BCx: 1 of 2 BCx + GPC  10/18 UCx: >100K CFU/ml Enterobacter Aerogenes S- Rocephin, zosyn , cipro R- cefazolin 10/18 MRSA PCR: negativre 10/22 UCX: in process 10/23 BCX repeat:  Thank you for allowing pharmacy to be a part of this patient's care.  Isac Sarna, BS [place], [place] Clinical Pharmacist Pager [phone] 05/05/2017 10:24 AM   Blood Culture ID Panel (Reflexed)  Order: [id number]  Status:  Final result Visible to patient:  No (Not Released) Next appt:  None   Ref Range & Units 1d ago  Enterococcus species NOT DETECTED NOT DETECTED   Listeria monocytogenes NOT DETECTED NOT DETECTED   Staphylococcus species NOT DETECTED DETECTED    Comment: Methicillin (oxacillin) resistant coagulase negative staphylococcus. Possible blood culture contaminant (unless isolated from more than one blood culture draw or clinical case suggests  pathogenicity). No antibiotic treatment is indicated for blood  culture contaminants.  CRITICAL RESULT CALLED TO, READ BACK BY AND VERIFIED WITH:  LIrma Newness.D. 14:30 05/05/17 ([email or web address])   Staphylococcus aureus NOT DETECTED NOT DETECTED   Methicillin resistance NOT DETECTED DETECTED    Comment: CRITICAL RESULT CALLED TO, READ BACK BY AND VERIFIED WITH:        Patient currently on Vancomycin and adequate coverage.  Paged Dr. Sheran Fava of results of [place] reported this  AM  Isac Sarna, BS Pharm D, BCPS Clinical Pharmacist 05/05/17 1440

## 2017-05-05 NOTE — Progress Notes (Signed)
Inpatient Diabetes Program Recommendations  AACE/ADA: New Consensus Statement on Inpatient Glycemic Control (2015)  Target Ranges:  Prepandial:   less than 140 mg/dL      Peak postprandial:   less than 180 mg/dL (1-2 hours)      Critically ill patients:  140 - 180 mg/dL   Results for YILIA, SACCA (MRN [id number]) as of 05/05/2017 11:15  Ref. Range 05/04/2017 07:55 05/04/2017 10:53 05/04/2017 16:27 05/04/2017 21:06  Glucose-Capillary Latest Ref Range: 65 - 99 mg/dL 181 (H) 183 (H) 227 (H) 265 (H)   Results for TEGHAN, PHILBIN (MRN [id number]) as of 05/05/2017 11:15  Ref. Range 05/05/2017 07:52  Glucose-Capillary Latest Ref Range: 65 - 99 mg/dL 190 (H)    Home DM Meds: Toujeo 65 units QHS       Humalog       Glipizide 10 mg daily  Current Insulin Orders: Novolog Sensitive Correction Scale/ SSI (0-9 units) TID AC + HS      MD- Note patient admitted with Hypoglycemia.  Recently switched to Toujeo from Lantus insulin per family.  Note that Novolog SSI just started back yesterday AM.  CBGs >180 mg/dl consistently.  Please consider starting a small portion of pt's home dose of basal insulin:  Recommend Lantus 15 units daily (0.2 units/kg dosing to start-- This would be about 25% total home dose)      --Will follow patient during hospitalization--  Wyn Quaker RN, MSN, CDE Diabetes Coordinator Inpatient Glycemic Control Team Team Pager: [phone] (8a-5p)

## 2017-05-05 NOTE — Clinical Social Work Placement (Signed)
   CLINICAL SOCIAL WORK PLACEMENT  NOTE  Date:  05/05/2017  Patient Details  Name: [name] MRN: [id number] Date of Birth: 1930-12-01  Clinical Social Work is seeking post-discharge placement for this patient at the [place] level of care (*CSW will initial, date and re-position this form in  chart as items are completed):  Yes   Patient/family provided with [place] Work Department's list of facilities offering this level of care within the geographic area requested by the patient (or if unable, by the patient's family).  Yes   Patient/family informed of their freedom to choose among providers that offer the needed level of care, that participate in Medicare, Medicaid or managed care program needed by the patient, have an available bed and are willing to accept the patient.  Yes   Patient/family informed of [facility]'s ownership interest in [facility] and [facility], as well as of the fact that they are under no obligation to receive care at these facilities.  PASRR submitted to EDS on 05/05/17     PASRR number received on 05/05/17     Existing PASRR number confirmed on       FL2 transmitted to all facilities in geographic area requested by pt/family on 05/05/17     FL2 transmitted to all facilities within larger geographic area on       Patient informed that his/her managed care company has contracts with or will negotiate with certain facilities, including the following:            Patient/family informed of bed offers received.  Patient chooses bed at       Physician recommends and patient chooses bed at      Patient to be transferred to   on  .  Patient to be transferred to facility by       Patient family notified on   of transfer.  Name of family member notified:        PHYSICIAN       Additional Comment:    _______________________________________________ Ihor Gully, LCSW 05/05/2017, 1:26 PM

## 2017-05-06 DIAGNOSIS — J9601 Acute respiratory failure with hypoxia: Secondary | ICD-10-CM

## 2017-05-06 DIAGNOSIS — R4182 Altered mental status, unspecified: Secondary | ICD-10-CM

## 2017-05-06 DIAGNOSIS — J9600 Acute respiratory failure, unspecified whether with hypoxia or hypercapnia: Secondary | ICD-10-CM

## 2017-05-06 DIAGNOSIS — I5033 Acute on chronic diastolic (congestive) heart failure: Secondary | ICD-10-CM

## 2017-05-06 LAB — BASIC METABOLIC PANEL
Anion gap: 9 (ref 5–15)
BUN: 34 mg/dL — AB (ref 6–20)
CO2: 27 mmol/L (ref 22–32)
CREATININE: 0.96 mg/dL (ref 0.44–1.00)
Calcium: 8.7 mg/dL — ABNORMAL LOW (ref 8.9–10.3)
Chloride: 99 mmol/L — ABNORMAL LOW (ref 101–111)
GFR calc Af Amer: >60 mL/min (ref >60)
GFR, EST NON AFRICAN AMERICAN: 53 mL/min — AB (ref >60)
Glucose, Bld: 210 mg/dL — ABNORMAL HIGH (ref 65–99)
Potassium: 3.3 mmol/L — ABNORMAL LOW (ref 3.5–5.1)
Sodium: 135 mmol/L (ref 135–145)

## 2017-05-06 LAB — URINE CULTURE
CULTURE: NO GROWTH
Culture: NO GROWTH

## 2017-05-06 LAB — CBC
HCT: 34.6 % — ABNORMAL LOW (ref 36.0–46.0)
Hemoglobin: 11.3 g/dL — ABNORMAL LOW (ref 12.0–15.0)
MCH: 29.3 pg (ref 26.0–34.0)
MCHC: 32.7 g/dL (ref 30.0–36.0)
MCV: 89.6 fL (ref 78.0–100.0)
PLATELETS: 300 10*3/uL (ref 150–400)
RBC: 3.86 MIL/uL — ABNORMAL LOW (ref 3.87–5.11)
RDW: 13.4 % (ref 11.5–15.5)
WBC: 7.2 10*3/uL (ref 4.0–10.5)

## 2017-05-06 LAB — GLUCOSE, CAPILLARY
GLUCOSE-CAPILLARY: 183 mg/dL — AB (ref 65–99)
GLUCOSE-CAPILLARY: 281 mg/dL — AB (ref 65–99)
Glucose-Capillary: 275 mg/dL — ABNORMAL HIGH (ref 65–99)
Glucose-Capillary: 277 mg/dL — ABNORMAL HIGH (ref 65–99)

## 2017-05-06 MED ORDER — POTASSIUM CHLORIDE CRYS ER 20 MEQ PO TBCR
20.0000 meq | EXTENDED_RELEASE_TABLET | Freq: Every day | ORAL | Status: DC
  Administered 2017-05-06 – 2017-05-08 (×3): 20 meq via ORAL
  Filled 2017-05-06 (×3): qty 1

## 2017-05-06 MED ORDER — INSULIN GLARGINE 100 UNIT/ML ~~LOC~~ SOLN
5.0000 [IU] | Freq: Every day | SUBCUTANEOUS | Status: DC
  Administered 2017-05-06: 5 [IU] via SUBCUTANEOUS
  Filled 2017-05-06 (×3): qty 0.05

## 2017-05-06 MED ORDER — CARVEDILOL 3.125 MG PO TABS
3.1250 mg | ORAL_TABLET | Freq: Two times a day (BID) | ORAL | Status: DC
  Administered 2017-05-06 – 2017-05-07 (×2): 3.125 mg via ORAL
  Filled 2017-05-06 (×2): qty 1

## 2017-05-06 MED ORDER — INSULIN ASPART 100 UNIT/ML ~~LOC~~ SOLN
0.0000 [IU] | Freq: Every day | SUBCUTANEOUS | Status: DC
  Administered 2017-05-06 – 2017-05-07 (×2): 3 [IU] via SUBCUTANEOUS

## 2017-05-06 MED ORDER — INSULIN ASPART 100 UNIT/ML ~~LOC~~ SOLN
0.0000 [IU] | Freq: Three times a day (TID) | SUBCUTANEOUS | Status: DC
  Administered 2017-05-06 – 2017-05-07 (×2): 8 [IU] via SUBCUTANEOUS
  Administered 2017-05-07 (×2): 5 [IU] via SUBCUTANEOUS
  Administered 2017-05-08: 8 [IU] via SUBCUTANEOUS
  Administered 2017-05-08: 5 [IU] via SUBCUTANEOUS

## 2017-05-06 NOTE — Progress Notes (Signed)
Physical Therapy Treatment Patient Details Name: [name] MRN: [id number] DOB: [birth year] Today's Date: 05/06/2017    History of Present Illness [name] is a 83 y.o. female with history of hypertension, diabetes mellitus type 2, chronic diastolic heart failure, erythroderma desquamativum, colon cancer s/p resection in 2006, arthritis and anxiety who presents with unresponsiveness. She was apparently in her normal state of health except for feeling chilly yesterday according to her daughter and she was normal when she went to bed. This morning, around 4:30am when her daughter was getting ready to leave, she turned on the light and the patient was unresponsive in her bed. She had a little bit of spittle crusted around her mouth and did not open her eyes or respond to touch.  The daughter called EMS.  When EMS checked her blood sugar was 54 and she was given an amp of D50 which caused her blood sugars to rise to the 240s.    PT Comments    Patient demonstrates much improvement for sitting up at bedside with less help to move LLE and c/o less left knee pain (see below).  Patient will benefit from continued physical therapy in hospital and recommended venue below to increase strength, balance, endurance for safe ADLs and gait.    Follow Up Recommendations  SNF;Supervision/Assistance - 24 hour     Equipment Recommendations  None recommended by PT    Recommendations for Other Services       Precautions / Restrictions Precautions Precautions: Fall Restrictions Weight Bearing Restrictions: No    Mobility  Bed Mobility Overal bed mobility: Needs Assistance Bed Mobility: Supine to Sit;Sit to Supine Rolling: Min guard   Supine to sit: Min assist        Transfers Overall transfer level: Needs assistance Equipment used: Rolling walker (2 wheeled);1 person hand held assist Transfers: Sit to/from [facility] Sit to Stand: Mod assist Stand pivot transfers: Mod  assist       General transfer comment: demonstrates increased BLE strength for sit to stands with less left knee pain  Ambulation/Gait Ambulation/Gait assistance: Mod assist;Max assist Ambulation Distance (Feet): 2 Feet Assistive device: Rolling walker (2 wheeled) Gait Pattern/deviations: Decreased step length - right;Decreased step length - left;Decreased stride length   Gait velocity interpretation: Below normal speed for age/gender General Gait Details: Patient limited to a few short shuffling like steps to transfer to chair, very unsteady requiring much verbal/tactile cueing   Stairs            Wheelchair Mobility    Modified Rankin (Stroke Patients Only)       Balance Overall balance assessment: Needs assistance Sitting-balance support: Feet supported Sitting balance-Leahy Scale: Fair     Standing balance support: Bilateral upper extremity supported;During functional activity Standing balance-Leahy Scale: Poor                              Cognition Arousal/Alertness: Awake/alert Behavior During Therapy: WFL for tasks assessed/performed Overall Cognitive Status: Within Functional Limits for tasks assessed                                        Exercises General Exercises - Lower Extremity Ankle Circles/Pumps: AROM;Strengthening;Both;10 reps;Supine Long Arc Quad: Seated;AROM;Strengthening;Both;10 reps Hip Flexion/Marching: Seated;AROM;Strengthening;Both;10 reps    General Comments        Pertinent Vitals/Pain Pain  Assessment: Faces Faces Pain Scale: Hurts little more Pain Location: left knee when standing Pain Descriptors / Indicators: Grimacing Pain Intervention(s): Limited activity within patient's tolerance;Monitored during session    Home Living                      Prior Function            PT Goals (current goals can now be found in the care plan section) Acute Rehab PT Goals Patient Stated Goal:  return home after rehab PT Goal Formulation: With patient/family Time For Goal Achievement: 05/18/17 Potential to Achieve Goals: Good Progress towards PT goals: Progressing toward goals    Frequency    Min 3X/week      PT Plan Current plan remains appropriate    Co-evaluation              AM-PAC PT "6 Clicks" Daily Activity  Outcome Measure  Difficulty turning over in bed (including adjusting bedclothes, sheets and blankets)?: Unable Difficulty moving from lying on back to sitting on the side of the bed? : Unable Difficulty sitting down on and standing up from a chair with arms (e.g., wheelchair, bedside commode, etc,.)?: Unable Help needed moving to and from a bed to chair (including a wheelchair)?: A Lot Help needed walking in hospital room?: A Lot Help needed climbing 3-5 steps with a railing? : Total 6 Click Score: 8    End of Session Equipment Utilized During Treatment: Gait belt Activity Tolerance: Patient tolerated treatment well;Patient limited by fatigue Patient left: in chair;with call bell/phone within reach;with family/visitor present Nurse Communication: Mobility status PT Visit Diagnosis: Unsteadiness on feet (R26.81);Other abnormalities of gait and mobility (R26.89);Muscle weakness (generalized) (M62.81)     Time: 1130-1150 PT Time Calculation (min) (ACUTE ONLY): 20 min  Charges:  $Therapeutic Activity: 8-22 mins                    G Codes:       3:58 PM, 2017/05/27 Lonell Grandchild, MPT Physical Therapist with [facility] [phone] office [phone] mobile phone

## 2017-05-06 NOTE — Progress Notes (Signed)
Inpatient Diabetes Program Recommendations  AACE/ADA: New Consensus Statement on Inpatient Glycemic Control (2015)  Target Ranges:  Prepandial:   less than 140 mg/dL      Peak postprandial:   less than 180 mg/dL (1-2 hours)      Critically ill patients:  140 - 180 mg/dL   Review of Glycemic Control  Diabetes history: DM 2 Home DM Meds: Toujeo 65 units QHS                             Humalog                             Glipizide 10 mg daily  Current Insulin Orders: Novolog Sensitive Correction Scale/ SSI (0-9 units) TID AC + HS  MD- Note patient admitted with Hypoglycemia.  Recently switched to Toujeo from Lantus insulin per family. Glucose elevated postprandially.  Please consider starting a small portion of pt's home dose of basal insulin:  Recommend Lantus 15 units daily (0.2 units/kg dosing to start-- This would be about 25% total home dose)  Thanks,  Tama Headings RN, MSN, [facility] Inpatient Diabetes Coordinator Team Pager [phone] (8a-5p)

## 2017-05-06 NOTE — Progress Notes (Signed)
PROGRESS NOTE  [name] FGH:[id number] DOB: [birth year] DOA: 04/30/2017 PCP: [name], MD  Brief History:  83 y.o.femalewith history of hypertension, diabetes mellitus type 2, chronic diastolic heart failure, erythroderma desquamativum, colon cancer s/p resection in 2006, arthritis and anxiety who presented with unresponsiveness.  She was found to be hypoglycemic and was transferred to the ER where she was hypothermic.  She recently changed to Toujeo 65 units q hs one week prior to admission and she also has a UTI.  She was hypoglycemic for several days and her CBG finally stabilized off of dextrose 10/22.  Overnight 05/04/17, however, she has developed a fever despite being on ceftriaxone for her UTI.  Assessment/Plan: Acute respiratory failure with hypoxia -Secondary to CHF -Improved with diuresis -Presently stable on room air  Acute on chronic diastolic CHF -May 16, 2016 Echo EF 60-65%, grade 2 DD -NEG 9 lbs since admission -Continue intravenous furosemide -Continue daily weights -Add low-dose carvedilol  Fever -Suspected drug fever -Has remained afebrile nearly 48 hours -Repeat urine culture without growth -Remain off antibiotics -Remains hemodynamically stable -Personally reviewed chest x-ray May 04, 2017--pulmonary vascular congestion with left basilar opacity -Clinically does not have pneumonia as her respiratory status continues to improve  Hypoglycemia -Secondary to recent change in insulin regimen and UTI -Resolved -TSH 0.540  Hypokalemia -Replete -Check magnesium  Enterobacter UTI -Treated with 5 days of ceftriaxone  Left calf pain and swelling, improved.   -TTP along patellar tendon but chronic problem per patient, not worse than it's been at home.  She can still move knee.  Highly Doubt septic arthritis.    - Lower extremity duplex:  Peripheral vascular disease and Baker's cyst -  Continue tylenol and prn ibuprofen -  Continue  voltaren gel -  Continue prn ultram  essential hypertension -Continue amlodipine -Add carvedilol  Anxiety,stable, continue xanax  HLD, chronic stable, continue pravastatin  Rash, RN and pharmacist have thoroughly reviewed medications and discovered pravastatin can cause a psoriasis like skin rash.   -  Needs dermatology follow up - d/c'd pravastatin - continue steroid creams  Disposition Plan:   SNF 10/26 if stable Family Communication:   Daughter updated at bedside--Total time spent 35 minutes.  Greater than 50% spent face to face counseling and coordinating care.   Consultants:   none  Code Status:   DNR  DVT Prophylaxis:  [place] Lovenox   Procedures: As Listed in Progress Note Above  Antibiotics: Ceftriaxone 10/18>>>10/22    Subjective: Patient denies fevers, chills, headache, chest pain, dyspnea, nausea, vomiting, diarrhea, abdominal pain, dysuria, hematuria, hematochezia, and melena.   Objective: Vitals:   05/05/17 2128 05/06/17 0425 05/06/17 0500 05/06/17 1502  BP: (!) 161/51 (!) 164/58  (!) 162/60  Pulse: 82 75  76  Resp: 17 18  16   Temp: 99 F (37.2 C) 98 F (36.7 C)  98.2 F (36.8 C)  TempSrc: Oral Oral  Oral  SpO2: 93% 94%  94%  Weight:   76.5 kg (168 lb 10.4 oz)   Height:        Intake/Output Summary (Last 24 hours) at 05/06/17 1710 Last data filed at 05/06/17 1435  Gross per 24 hour  Intake              240 ml  Output             3100 ml  Net            -  2860 ml   Weight change: 0.296 kg (10.4 oz) Exam:   General:  Pt is alert, follows commands appropriately, not in acute distress  HEENT: No icterus, No thrush, No neck mass, [place]/AT  Cardiovascular: RRR, S1/S2, no rubs, no gallops  Respiratory: Bibasilar crackles.  No wheezing  Abdomen: Soft/+BS, non tender, non distended, no guarding  Extremities: 1 + LE edema, No lymphangitis, No petechiae, No rashes, no synovitis   Data Reviewed: I have personally reviewed following labs and  imaging studies Basic Metabolic Panel:  Recent Labs Lab 04/30/17 0637 05/01/17 0543 05/04/17 0841 05/05/17 0551 05/06/17 0432  NA 141 140 136 137 135  K 3.4* 4.1 3.8 3.7 3.3*  CL 108 110 99* 99* 99*  CO2 26 21* 27 28 27   GLUCOSE 120* 78 192* 209* 210*  BUN 23* 20 30* 31* 34*  CREATININE 0.84 0.92 1.03* 1.06* 0.96  CALCIUM 8.1* 8.3* 8.8* 8.7* 8.7*   Liver Function Tests:  Recent Labs Lab 04/30/17 0637  AST 25  ALT 20  ALKPHOS 57  BILITOT 0.4  PROT 6.2*  ALBUMIN 2.9*   No results for input(s): LIPASE, AMYLASE in the last 168 hours. No results for input(s): AMMONIA in the last 168 hours. Coagulation Profile: No results for input(s): INR, PROTIME in the last 168 hours. CBC:  Recent Labs Lab 04/30/17 0637 05/01/17 0543 05/04/17 0841 05/05/17 0551 05/06/17 0432  WBC 8.9 8.0 9.7 7.8 7.2  NEUTROABS 7.6  --  6.0  --   --   HGB 11.6* 11.3* 12.3 11.3* 11.3*  HCT 35.3* 34.7* 37.3 35.4* 34.6*  MCV 89.8 91.1 89.7 90.1 89.6  PLT 252 246 274 284 300   Cardiac Enzymes:  Recent Labs Lab 04/30/17 0637  TROPONINI <0.03   BNP: Invalid input(s): POCBNP CBG:  Recent Labs Lab 05/05/17 1159 05/05/17 1702 05/05/17 2124 05/06/17 0754 05/06/17 1116  GLUCAP 219* 243* 326* 183* 275*   HbA1C: No results for input(s): HGBA1C in the last 72 hours. Urine analysis:    Component Value Date/Time   COLORURINE AMBER (A) 05/04/2017 0736   APPEARANCEUR TURBID (A) 05/04/2017 0736   LABSPEC 1.020 05/04/2017 0736   PHURINE 6.0 05/04/2017 0736   GLUCOSEU NEGATIVE 05/04/2017 0736   HGBUR NEGATIVE 05/04/2017 0736   BILIRUBINUR NEGATIVE 05/04/2017 0736   KETONESUR NEGATIVE 05/04/2017 0736   PROTEINUR 30 (A) 05/04/2017 0736   UROBILINOGEN 0.2 08/16/2010 2011   NITRITE NEGATIVE 05/04/2017 0736   LEUKOCYTESUR LARGE (A) 05/04/2017 0736   Sepsis Labs: @LABRCNTIP (procalcitonin:4,lacticidven:4) ) Recent Results (from the past 240 hour(s))  Culture, Urine     Status: Abnormal    Collection Time: 04/30/17  6:10 AM  Result Value Ref Range Status   Specimen Description URINE, CATHETERIZED  Final   Special Requests NONE  Final   Culture >=100,000 COLONIES/mL ENTEROBACTER AEROGENES (A)  Final   Report Status 05/02/2017 FINAL  Final   Organism ID, Bacteria ENTEROBACTER AEROGENES (A)  Final      Susceptibility   Enterobacter aerogenes - MIC*    CEFAZOLIN >=64 RESISTANT Resistant     CEFTRIAXONE <=1 SENSITIVE Sensitive     CIPROFLOXACIN <=0.25 SENSITIVE Sensitive     GENTAMICIN <=1 SENSITIVE Sensitive     IMIPENEM 1 SENSITIVE Sensitive     NITROFURANTOIN 64 INTERMEDIATE Intermediate     TRIMETH/SULFA <=20 SENSITIVE Sensitive     PIP/TAZO <=4 SENSITIVE Sensitive     * >=100,000 COLONIES/mL ENTEROBACTER AEROGENES  Culture, blood (routine x 2)  Status: None   Collection Time: 04/30/17  6:38 AM  Result Value Ref Range Status   Specimen Description BLOOD RIGHT ARM  Final   Special Requests   Final    BOTTLES DRAWN AEROBIC AND ANAEROBIC Blood Culture adequate volume   Culture NO GROWTH 5 DAYS  Final   Report Status 05/05/2017 FINAL  Final  Culture, blood (routine x 2)     Status: None   Collection Time: 04/30/17  6:50 AM  Result Value Ref Range Status   Specimen Description BLOOD RIGHT HAND  Final   Special Requests   Final    BOTTLES DRAWN AEROBIC ONLY Blood Culture adequate volume   Culture NO GROWTH 5 DAYS  Final   Report Status 05/05/2017 FINAL  Final  MRSA PCR Screening     Status: None   Collection Time: 05/01/17  1:05 AM  Result Value Ref Range Status   MRSA by PCR NEGATIVE NEGATIVE Final    Comment:        The GeneXpert MRSA Assay (FDA approved for NASAL specimens only), is one component of a comprehensive MRSA colonization surveillance program. It is not intended to diagnose MRSA infection nor to guide or monitor treatment for MRSA infections.   Culture, blood (Routine X 2) w Reflex to ID Panel     Status: None (Preliminary result)    Collection Time: 05/04/17  8:19 AM  Result Value Ref Range Status   Specimen Description RIGHT ANTECUBITAL  Final   Special Requests   Final    BOTTLES DRAWN AEROBIC AND ANAEROBIC Blood Culture adequate volume   Culture NO GROWTH 2 DAYS  Final   Report Status PENDING  Incomplete  Culture, blood (Routine X 2) w Reflex to ID Panel     Status: None (Preliminary result)   Collection Time: 05/04/17  8:41 AM  Result Value Ref Range Status   Specimen Description BLOOD RIGHT HAND  Final   Special Requests   Final    BOTTLES DRAWN AEROBIC ONLY Blood Culture results may not be optimal due to an inadequate volume of blood received in culture bottles   Culture  Setup Time   Final    GRAM POSITIVE COCCI Gram Stain Report Called to,Read Back By and Verified With: COVINGTON L @ [id number] ON [id number] BY HENDERSON L. CRITICAL RESULT CALLED TO, READ BACK BY AND VERIFIED WITH: LIrma Newness.D. 14:30 05/05/17  ([email or web address])    Culture   Final    GRAM POSITIVE COCCI IDENTIFICATION TO FOLLOW CULTURE REINCUBATED FOR BETTER GROWTH Performed at [facility], [place] [address], [place], [place] [address]    Report Status PENDING  Incomplete  Blood Culture ID Panel (Reflexed)     Status: Abnormal   Collection Time: 05/04/17  8:41 AM  Result Value Ref Range Status   Enterococcus species NOT DETECTED NOT DETECTED Final   Listeria monocytogenes NOT DETECTED NOT DETECTED Final   Staphylococcus species DETECTED (A) NOT DETECTED Final    Comment: Methicillin (oxacillin) resistant coagulase negative staphylococcus. Possible blood culture contaminant (unless isolated from more than one blood culture draw or clinical case suggests pathogenicity). No antibiotic treatment is indicated for blood  culture contaminants. CRITICAL RESULT CALLED TO, READ BACK BY AND VERIFIED WITH: LIrma Newness.D. 14:30 05/05/17 ([email or web address])    Staphylococcus aureus NOT DETECTED NOT DETECTED Final   Methicillin resistance DETECTED (A) NOT  DETECTED Final    Comment: CRITICAL RESULT CALLED TO, READ BACK BY AND VERIFIED WITH: LIrma Newness.D.  14:30 05/05/17 ([email or web address])    Streptococcus species NOT DETECTED NOT DETECTED Final   Streptococcus agalactiae NOT DETECTED NOT DETECTED Final   Streptococcus pneumoniae NOT DETECTED NOT DETECTED Final   Streptococcus pyogenes NOT DETECTED NOT DETECTED Final   Acinetobacter baumannii NOT DETECTED NOT DETECTED Final   Enterobacteriaceae species NOT DETECTED NOT DETECTED Final   Enterobacter cloacae complex NOT DETECTED NOT DETECTED Final   Escherichia coli NOT DETECTED NOT DETECTED Final   Klebsiella oxytoca NOT DETECTED NOT DETECTED Final   Klebsiella pneumoniae NOT DETECTED NOT DETECTED Final   Proteus species NOT DETECTED NOT DETECTED Final   Serratia marcescens NOT DETECTED NOT DETECTED Final   Haemophilus influenzae NOT DETECTED NOT DETECTED Final   Neisseria meningitidis NOT DETECTED NOT DETECTED Final   Pseudomonas aeruginosa NOT DETECTED NOT DETECTED Final   Candida albicans NOT DETECTED NOT DETECTED Final   Candida glabrata NOT DETECTED NOT DETECTED Final   Candida krusei NOT DETECTED NOT DETECTED Final   Candida parapsilosis NOT DETECTED NOT DETECTED Final   Candida tropicalis NOT DETECTED NOT DETECTED Final    Comment: Performed at [facility], [place] [address], [place], [place] [address]  Culture, Urine     Status: None   Collection Time: 05/04/17  2:00 PM  Result Value Ref Range Status   Specimen Description URINE, RANDOM  Final   Special Requests NONE  Final   Culture   Final    NO GROWTH Performed at [facility], [place] [address], [place], [place] [address]    Report Status 05/06/2017 FINAL  Final  Culture, Urine     Status: None   Collection Time: 05/04/17  2:18 PM  Result Value Ref Range Status   Specimen Description URINE, CATHETERIZED  Final   Special Requests NONE  Final   Culture   Final    NO GROWTH Performed at [facility], [place]  [address] [place], [place] [address]    Report Status 05/06/2017 FINAL  Final  Culture, blood (Routine X 2) w Reflex to ID Panel     Status: None (Preliminary result)   Collection Time: 05/05/17 12:00 PM  Result Value Ref Range Status   Specimen Description BLOOD RIGHT HAND  Final   Special Requests   Final    BOTTLES DRAWN AEROBIC AND ANAEROBIC Blood Culture adequate volume   Culture NO GROWTH < 24 HOURS  Final   Report Status PENDING  Incomplete  Culture, blood (Routine X 2) w Reflex to ID Panel     Status: None (Preliminary result)   Collection Time: 05/05/17 12:20 PM  Result Value Ref Range Status   Specimen Description BLOOD LEFT HAND  Final   Special Requests   Final    BOTTLES DRAWN AEROBIC AND ANAEROBIC Blood Culture adequate volume   Culture NO GROWTH < 24 HOURS  Final   Report Status PENDING  Incomplete     Scheduled Meds: . ALPRAZolam  0.5 mg Oral TID  . amLODipine  5 mg Oral Daily  . diclofenac sodium  4 g Topical QID  . docusate sodium  100 mg Oral BID  . enoxaparin (LOVENOX) injection  40 mg Subcutaneous Q24H  . furosemide  40 mg Intravenous BID  . insulin aspart  0-5 Units Subcutaneous QHS  . insulin aspart  0-9 Units Subcutaneous TID WC  . prednisoLONE acetate  1 drop Both Eyes QID  . triamcinolone cream   Topical BID   Continuous Infusions:  Procedures/Studies: Ct  Head Wo Contrast  Result Date: 04/30/2017 CLINICAL DATA:  Acute development of altered mental status today. EXAM: CT HEAD WITHOUT CONTRAST TECHNIQUE: Contiguous axial images were obtained from the base of the skull through the vertex without intravenous contrast. COMPARISON:  09/01/2016 FINDINGS: Brain: Mild age related volume loss. No sign of acute infarction, mass lesion, hemorrhage, hydrocephalus or extra-axial collection. Vascular: No abnormal vascular finding. Skull: Normal Sinuses/Orbits: Clear/normal Other: None IMPRESSION: No change.  Normal head CT for age.  Mild volume loss. Electronically Signed    By: Nelson Chimes M.D.   On: 04/30/2017 09:07   US Venous Img Lower Unilateral Left  Result Date: 04/30/2017 CLINICAL DATA:  Calf region pain and edema EXAM: LEFT LOWER EXTREMITY VENOUS DUPLEX ULTRASOUND TECHNIQUE: Gray-scale sonography with graded compression, as well as color Doppler and duplex ultrasound were performed to evaluate the left lower extremity deep venous system from the level of the common femoral vein and including the common femoral, femoral, profunda femoral, popliteal and calf veins including the posterior tibial, peroneal and gastrocnemius veins when visible. The superficial great saphenous vein was also interrogated. Spectral Doppler was utilized to evaluate flow at rest and with distal augmentation maneuvers in the common femoral, femoral and popliteal veins. COMPARISON:  None. FINDINGS: Contralateral Common Femoral Vein: Respiratory phasicity is normal and symmetric with the symptomatic side. No evidence of thrombus. Normal compressibility. Common Femoral Vein: No evidence of thrombus. Normal compressibility, respiratory phasicity and response to augmentation. Saphenofemoral Junction: No evidence of thrombus. Normal compressibility and flow on color Doppler imaging. Profunda Femoral Vein: No evidence of thrombus. Normal compressibility and flow on color Doppler imaging. Femoral Vein: No evidence of thrombus. Normal compressibility, respiratory phasicity and response to augmentation. Popliteal Vein: No evidence of thrombus. Normal compressibility, respiratory phasicity and response to augmentation. Calf Veins: No evidence of thrombus. Normal compressibility and flow on color Doppler imaging. Superficial Great Saphenous Vein: No evidence of thrombus. Normal compressibility. Venous Reflux:  None. Other Findings: There is moderate narrowing of the mid to distal left superficial femoral artery with collateral flow in this area. There is a popliteal fossa cyst measuring 3.0 x 1.5 x 2.9 cm  IMPRESSION: 1. No evidence of deep venous thrombosis in the left lower extremity. Right common femoral artery also patent. 2. Obstructing lesion mid to distal left superficial femoral artery with atherosclerotic plaque in this area. Collateral flow seen in this area. 3. Baker cyst in the left popliteal fossa measuring 3.0 x 1.5 x 2.9 cm. Electronically Signed   By: Lowella Grip III M.D.   On: 04/30/2017 09:52   Dg Chest Port 1 View  Result Date: 05/04/2017 CLINICAL DATA:  Fever. EXAM: PORTABLE CHEST 1 VIEW COMPARISON:  Chest x-ray dated April 30, 2017. FINDINGS: Mild cardiomegaly and pulmonary vascular congestion. Low lung volumes with persistent increased opacity at the left lung base. No large pleural effusion. No pneumothorax. No acute osseous abnormality. IMPRESSION: Cardiomegaly and pulmonary vascular congestion. Persistent increased opacity at the left lung base, which could reflect atelectasis or pneumonia. Electronically Signed   By: Titus Dubin M.D.   On: 05/04/2017 10:19   Dg Chest Port 1 View  Result Date: 04/30/2017 CLINICAL DATA:  Hypothermia. History of colon cancer, CHF, diabetes, hypertension. Nonsmoker. EXAM: PORTABLE CHEST 1 VIEW COMPARISON:  02/11/2017 FINDINGS: Shallow inspiration. Cardiac enlargement with pulmonary vascular congestion. Infiltration in the lung bases may represent early edema. No consolidation. No pleural effusions. No pneumothorax. Degenerative changes in the spine and shoulders. IMPRESSION: New cardiac  enlargement, pulmonary vascular congestion, and mild basilar edema. Electronically Signed   By: Lucienne Capers M.D.   On: 04/30/2017 06:31    Gertha Lichtenberg, DO  [facility] Pager [phone]  If 7PM-7AM, please contact night-coverage [email or web address] Password [email or web address] 05/06/2017, 5:10 PM   LOS: 2 days

## 2017-05-07 ENCOUNTER — Inpatient Hospital Stay (HOSPITAL_COMMUNITY): Payer: Medicare Other

## 2017-05-07 DIAGNOSIS — I340 Nonrheumatic mitral (valve) insufficiency: Secondary | ICD-10-CM

## 2017-05-07 LAB — CBC
HEMATOCRIT: 35.9 % — AB (ref 36.0–46.0)
Hemoglobin: 11.8 g/dL — ABNORMAL LOW (ref 12.0–15.0)
MCH: 29.2 pg (ref 26.0–34.0)
MCHC: 32.9 g/dL (ref 30.0–36.0)
MCV: 88.9 fL (ref 78.0–100.0)
PLATELETS: 343 10*3/uL (ref 150–400)
RBC: 4.04 MIL/uL (ref 3.87–5.11)
RDW: 13.3 % (ref 11.5–15.5)
WBC: 6.7 10*3/uL (ref 4.0–10.5)

## 2017-05-07 LAB — ECHOCARDIOGRAM COMPLETE
CHL CUP MV DEC (S): 331
CHL CUP RV SYS PRESS: 17 mmHg
CHL CUP TV REG PEAK VELOCITY: 189 cm/s
E decel time: 331 msec
EERAT: 18.93
FS: 43 % (ref 28–44)
Height: 64 in
IV/PV OW: 1.03
LA diam end sys: 34 mm
LA vol: 38.5 mL
LADIAMINDEX: 1.88 cm/m2
LASIZE: 34 mm
LAVOLA4C: 38.6 mL
LAVOLIN: 21.3 mL/m2
LDCA: 3.14 cm2
LV E/e' medial: 18.93
LV TDI E'LATERAL: 3.92
LV TDI E'MEDIAL: 4.79
LV e' LATERAL: 3.92 cm/s
LVEEAVG: 18.93
LVOTD: 20 mm
MV Peak grad: 2 mmHg
MV pk E vel: 74.2 m/s
MVPKAVEL: 95.1 m/s
PW: 12.1 mm — AB (ref 0.6–1.1)
TAPSE: 20.4 mm
TR max vel: 189 cm/s
Weight: 2652.57 oz

## 2017-05-07 LAB — GLUCOSE, CAPILLARY
GLUCOSE-CAPILLARY: 204 mg/dL — AB (ref 65–99)
Glucose-Capillary: 233 mg/dL — ABNORMAL HIGH (ref 65–99)
Glucose-Capillary: 290 mg/dL — ABNORMAL HIGH (ref 65–99)
Glucose-Capillary: 265 mg/dL — ABNORMAL HIGH (ref 65–99)

## 2017-05-07 LAB — BASIC METABOLIC PANEL
Anion gap: 11 (ref 5–15)
BUN: 38 mg/dL — AB (ref 6–20)
CHLORIDE: 100 mmol/L — AB (ref 101–111)
CO2: 27 mmol/L (ref 22–32)
CREATININE: 0.95 mg/dL (ref 0.44–1.00)
Calcium: 9 mg/dL (ref 8.9–10.3)
GFR calc Af Amer: >60 mL/min (ref >60)
GFR calc non Af Amer: 53 mL/min — ABNORMAL LOW (ref >60)
GLUCOSE: 209 mg/dL — AB (ref 65–99)
POTASSIUM: 3.8 mmol/L (ref 3.5–5.1)
SODIUM: 138 mmol/L (ref 135–145)

## 2017-05-07 LAB — CULTURE, BLOOD (ROUTINE X 2)

## 2017-05-07 LAB — MAGNESIUM: Magnesium: 1.9 mg/dL (ref 1.7–2.4)

## 2017-05-07 MED ORDER — INSULIN ASPART 100 UNIT/ML ~~LOC~~ SOLN
5.0000 [IU] | Freq: Three times a day (TID) | SUBCUTANEOUS | Status: DC
  Administered 2017-05-07 – 2017-05-08 (×3): 5 [IU] via SUBCUTANEOUS

## 2017-05-07 MED ORDER — FUROSEMIDE 40 MG PO TABS
40.0000 mg | ORAL_TABLET | Freq: Every day | ORAL | Status: DC
  Administered 2017-05-08: 40 mg via ORAL
  Filled 2017-05-07: qty 1

## 2017-05-07 MED ORDER — INSULIN GLARGINE 100 UNIT/ML ~~LOC~~ SOLN
10.0000 [IU] | Freq: Every day | SUBCUTANEOUS | Status: DC
  Administered 2017-05-07: 10 [IU] via SUBCUTANEOUS
  Filled 2017-05-07 (×4): qty 0.1

## 2017-05-07 MED ORDER — CARVEDILOL 3.125 MG PO TABS
6.2500 mg | ORAL_TABLET | Freq: Two times a day (BID) | ORAL | Status: DC
  Administered 2017-05-07 – 2017-05-08 (×2): 6.25 mg via ORAL
  Filled 2017-05-07 (×2): qty 2

## 2017-05-07 NOTE — Progress Notes (Signed)
*  PRELIMINARY RESULTS* Echocardiogram 2D Echocardiogram has been performed.  [name] 05/07/2017, 4:15 PM

## 2017-05-07 NOTE — Clinical Social Work Placement (Signed)
   CLINICAL SOCIAL WORK PLACEMENT  NOTE  Date:  05/07/2017  Patient Details  Name: [name] MRN: [id number] Date of Birth: 1931-07-04  Clinical Social Work is seeking post-discharge placement for this patient at the [place] level of care (*CSW will initial, date and re-position this form in  chart as items are completed):  Yes   Patient/family provided with [facility]'s list of facilities offering this level of care within the geographic area requested by the patient (or if unable, by the patient's family).  Yes   Patient/family informed of their freedom to choose among providers that offer the needed level of care, that participate in Medicare, Medicaid or managed care program needed by the patient, have an available bed and are willing to accept the patient.  Yes   Patient/family informed of [facility]'s ownership interest in [facility] and [facility], as well as of the fact that they are under no obligation to receive care at these facilities.  PASRR submitted to EDS on 05/05/17     PASRR number received on 05/05/17     Existing PASRR number confirmed on       FL2 transmitted to all facilities in geographic area requested by pt/family on 05/05/17     FL2 transmitted to all facilities within larger geographic area on       Patient informed that his/her managed care company has contracts with or will negotiate with certain facilities, including the following:        Yes   Patient/family informed of bed offers received.  Patient chooses bed at [facility]     Physician recommends and patient chooses bed at      Patient to be transferred to   on  .  Patient to be transferred to facility by       Patient family notified on   of transfer.  Name of family member notified:        PHYSICIAN       Additional Comment:    _______________________________________________ Shade Flood,  LCSW 05/07/2017, 11:06 AM

## 2017-05-07 NOTE — Progress Notes (Signed)
PROGRESS NOTE  [name] MWU:[id number] DOB: [birth year] DOA: 04/30/2017 PCP: [name], MD   Brief History:  83 y.o.femalewith history of hypertension, diabetes mellitus type 2, chronic diastolic heart failure, erythroderma desquamativum, colon cancer s/p resection in 2006, arthritis and anxiety who presented with unresponsiveness. She was found to be hypoglycemic and was transferred to the ER where she was hypothermic. She recently changed to Toujeo 65 units q hs one week prior to admission and she also has a UTI. She was hypoglycemic for several days and her CBG finally stabilized off of dextrose 10/22. Overnight 05/04/17, however, she has developed a fever despite being on ceftriaxone for her UTI.  Assessment/Plan: Acute respiratory failure with hypoxia -Secondary to CHF -Improved with diuresis -Presently stable on room air  Acute on chronic diastolic CHF -May 16, 2016 Echo EF 60-65%, grade 2 DD -NEG 12 lbs since admission -Continue intravenous furosemide>>po furosemide on 10/26 -Continue daily weights -Added low-dose carvedilol  Fever -Suspected drug fever -Has remained afebrile nearly 72 hours -Repeat urine culture without growth -Remain off antibiotics -Remains hemodynamically stable -Personally reviewed chest x-ray May 04, 2017--pulmonary vascular congestion with left basilar opacity -Clinically does not have pneumonia as her respiratory status continues to improve  Hypoglycemia/Diabetes Mellitus type 2 -Secondary to recent change in insulin regimen and UTI -Resolved -TSH 0.540 -increase Lantus to 10 units daily -add novolog 5 units with meals  Hypokalemia -Replete -Check magnesium--1.9  Enterobacter UTI -Treated with 5 days of ceftriaxone  Left calf pain and swelling, improved.  -TTP along patellar tendon but chronic problem per patient, not worse than it's been at home. She can still move knee. Highly Doubt septic  arthritis.  - Lower extremity duplex: Peripheral vascular disease and Baker's cyst - Continue tylenol and prn ibuprofen - Continue voltaren gel - Continue prnultram  essential hypertension -Continue amlodipine -increase carvedilol to 6.25 mg bid  Anxiety,stable, continue xanax  HLD, chronic stable, continue pravastatin  Rash, RN and pharmacist have thoroughly reviewed medications and discovered pravastatin can cause a psoriasis like skin rash.  - Needs dermatology follow up - d/c'd pravastatin - continue steroid creams  Disposition Plan:   SNF 10/26 if stable Family Communication:   Granddaughter updated on phone   Consultants:   none  Code Status:   DNR  DVT Prophylaxis:  [place] Lovenox   Procedures: As Listed in Progress Note Above  Antibiotics: Ceftriaxone 10/18>>>10/22    Subjective: Patient denies fevers, chills, headache, chest pain, dyspnea, nausea, vomiting, diarrhea, abdominal pain, dysuria, hematuria, hematochezia, and melena.   Objective: Vitals:   05/06/17 2148 05/07/17 0533 05/07/17 1244 05/07/17 1315  BP: (!) 169/61 (!) 154/53 (!) 153/52   Pulse: 69 70 73   Resp: 15 16 20    Temp: 98.2 F (36.8 C) 98.2 F (36.8 C) 97.6 F (36.4 C)   TempSrc: Oral  Oral   SpO2:  94% 96% 98%  Weight:  75.2 kg (165 lb 12.6 oz)    Height:        Intake/Output Summary (Last 24 hours) at 05/07/17 1643 Last data filed at 05/07/17 1027  Gross per 24 hour  Intake                0 ml  Output             2400 ml  Net            -2400 ml   Weight change: -  1.3 kg (-2 lb 13.9 oz) Exam:   General:  Pt is alert, follows commands appropriately, not in acute distress  HEENT: No icterus, No thrush, No neck mass, [place]/AT  Cardiovascular: RRR, S1/S2, no rubs, no gallops  Respiratory: CTA bilaterally, no wheezing, no crackles, no rhonchi  Abdomen: Soft/+BS, non tender, non distended, no guarding  Extremities: 1 +LE edema, No lymphangitis, No  petechiae, No rashes, no synovitis   Data Reviewed: I have personally reviewed following labs and imaging studies Basic Metabolic Panel:  Recent Labs Lab 05/01/17 0543 05/04/17 0841 05/05/17 0551 05/06/17 0432 05/07/17 0543  NA 140 136 137 135 138  K 4.1 3.8 3.7 3.3* 3.8  CL 110 99* 99* 99* 100*  CO2 21* 27 28 27 27   GLUCOSE 78 192* 209* 210* 209*  BUN 20 30* 31* 34* 38*  CREATININE 0.92 1.03* 1.06* 0.96 0.95  CALCIUM 8.3* 8.8* 8.7* 8.7* 9.0  MG  --   --   --   --  1.9   Liver Function Tests: No results for input(s): AST, ALT, ALKPHOS, BILITOT, PROT, ALBUMIN in the last 168 hours. No results for input(s): LIPASE, AMYLASE in the last 168 hours. No results for input(s): AMMONIA in the last 168 hours. Coagulation Profile: No results for input(s): INR, PROTIME in the last 168 hours. CBC:  Recent Labs Lab 05/01/17 0543 05/04/17 0841 05/05/17 0551 05/06/17 0432 05/07/17 0543  WBC 8.0 9.7 7.8 7.2 6.7  NEUTROABS  --  6.0  --   --   --   HGB 11.3* 12.3 11.3* 11.3* 11.8*  HCT 34.7* 37.3 35.4* 34.6* 35.9*  MCV 91.1 89.7 90.1 89.6 88.9  PLT 246 274 284 300 343   Cardiac Enzymes: No results for input(s): CKTOTAL, CKMB, CKMBINDEX, TROPONINI in the last 168 hours. BNP: Invalid input(s): POCBNP CBG:  Recent Labs Lab 05/06/17 1735 05/06/17 2147 05/07/17 0733 05/07/17 1135 05/07/17 1616  GLUCAP 277* 281* 233* 204* 290*   HbA1C: No results for input(s): HGBA1C in the last 72 hours. Urine analysis:    Component Value Date/Time   COLORURINE AMBER (A) 05/04/2017 0736   APPEARANCEUR TURBID (A) 05/04/2017 0736   LABSPEC 1.020 05/04/2017 0736   PHURINE 6.0 05/04/2017 0736   GLUCOSEU NEGATIVE 05/04/2017 0736   HGBUR NEGATIVE 05/04/2017 0736   BILIRUBINUR NEGATIVE 05/04/2017 0736   KETONESUR NEGATIVE 05/04/2017 0736   PROTEINUR 30 (A) 05/04/2017 0736   UROBILINOGEN 0.2 08/16/2010 2011   NITRITE NEGATIVE 05/04/2017 0736   LEUKOCYTESUR LARGE (A) 05/04/2017 0736    Sepsis Labs: @LABRCNTIP (procalcitonin:4,lacticidven:4) ) Recent Results (from the past 240 hour(s))  Culture, Urine     Status: Abnormal   Collection Time: 04/30/17  6:10 AM  Result Value Ref Range Status   Specimen Description URINE, CATHETERIZED  Final   Special Requests NONE  Final   Culture >=100,000 COLONIES/mL ENTEROBACTER AEROGENES (A)  Final   Report Status 05/02/2017 FINAL  Final   Organism ID, Bacteria ENTEROBACTER AEROGENES (A)  Final      Susceptibility   Enterobacter aerogenes - MIC*    CEFAZOLIN >=64 RESISTANT Resistant     CEFTRIAXONE <=1 SENSITIVE Sensitive     CIPROFLOXACIN <=0.25 SENSITIVE Sensitive     GENTAMICIN <=1 SENSITIVE Sensitive     IMIPENEM 1 SENSITIVE Sensitive     NITROFURANTOIN 64 INTERMEDIATE Intermediate     TRIMETH/SULFA <=20 SENSITIVE Sensitive     PIP/TAZO <=4 SENSITIVE Sensitive     * >=100,000 COLONIES/mL ENTEROBACTER AEROGENES  Culture, blood (routine  x 2)     Status: None   Collection Time: 04/30/17  6:38 AM  Result Value Ref Range Status   Specimen Description BLOOD RIGHT ARM  Final   Special Requests   Final    BOTTLES DRAWN AEROBIC AND ANAEROBIC Blood Culture adequate volume   Culture NO GROWTH 5 DAYS  Final   Report Status 05/05/2017 FINAL  Final  Culture, blood (routine x 2)     Status: None   Collection Time: 04/30/17  6:50 AM  Result Value Ref Range Status   Specimen Description BLOOD RIGHT HAND  Final   Special Requests   Final    BOTTLES DRAWN AEROBIC ONLY Blood Culture adequate volume   Culture NO GROWTH 5 DAYS  Final   Report Status 05/05/2017 FINAL  Final  MRSA PCR Screening     Status: None   Collection Time: 05/01/17  1:05 AM  Result Value Ref Range Status   MRSA by PCR NEGATIVE NEGATIVE Final    Comment:        The GeneXpert MRSA Assay (FDA approved for NASAL specimens only), is one component of a comprehensive MRSA colonization surveillance program. It is not intended to diagnose MRSA infection nor to guide  or monitor treatment for MRSA infections.   Culture, blood (Routine X 2) w Reflex to ID Panel     Status: None (Preliminary result)   Collection Time: 05/04/17  8:19 AM  Result Value Ref Range Status   Specimen Description RIGHT ANTECUBITAL  Final   Special Requests   Final    BOTTLES DRAWN AEROBIC AND ANAEROBIC Blood Culture adequate volume   Culture NO GROWTH 3 DAYS  Final   Report Status PENDING  Incomplete  Culture, blood (Routine X 2) w Reflex to ID Panel     Status: Abnormal   Collection Time: 05/04/17  8:41 AM  Result Value Ref Range Status   Specimen Description BLOOD RIGHT HAND  Final   Special Requests   Final    BOTTLES DRAWN AEROBIC ONLY Blood Culture results may not be optimal due to an inadequate volume of blood received in culture bottles   Culture  Setup Time   Final    GRAM POSITIVE COCCI Gram Stain Report Called to,Read Back By and Verified With: COVINGTON L @ [id number] ON [id number] BY HENDERSON L. CRITICAL RESULT CALLED TO, READ BACK BY AND VERIFIED WITH: LIrma Newness.D. 14:30 05/05/17  ([email or web address])    Culture (A)  Final    STAPHYLOCOCCUS SPECIES (COAGULASE NEGATIVE) THE SIGNIFICANCE OF ISOLATING THIS ORGANISM FROM A SINGLE SET OF BLOOD CULTURES WHEN MULTIPLE SETS ARE DRAWN IS UNCERTAIN. PLEASE NOTIFY THE MICROBIOLOGY DEPARTMENT WITHIN ONE WEEK IF SPECIATION AND SENSITIVITIES ARE REQUIRED. Performed at [facility], [place] [address], [place], [place] [address]    Report Status 05/07/2017 FINAL  Final  Blood Culture ID Panel (Reflexed)     Status: Abnormal   Collection Time: 05/04/17  8:41 AM  Result Value Ref Range Status   Enterococcus species NOT DETECTED NOT DETECTED Final   Listeria monocytogenes NOT DETECTED NOT DETECTED Final   Staphylococcus species DETECTED (A) NOT DETECTED Final    Comment: Methicillin (oxacillin) resistant coagulase negative staphylococcus. Possible blood culture contaminant (unless isolated from more than one blood culture draw or  clinical case suggests pathogenicity). No antibiotic treatment is indicated for blood  culture contaminants. CRITICAL RESULT CALLED TO, READ BACK BY AND VERIFIED WITH: LIrma Newness.D. 14:30 05/05/17 ([email or web address])    Staphylococcus  aureus NOT DETECTED NOT DETECTED Final   Methicillin resistance DETECTED (A) NOT DETECTED Final    Comment: CRITICAL RESULT CALLED TO, READ BACK BY AND VERIFIED WITH: LIrma Newness.D. 14:30 05/05/17 ([email or web address])    Streptococcus species NOT DETECTED NOT DETECTED Final   Streptococcus agalactiae NOT DETECTED NOT DETECTED Final   Streptococcus pneumoniae NOT DETECTED NOT DETECTED Final   Streptococcus pyogenes NOT DETECTED NOT DETECTED Final   Acinetobacter baumannii NOT DETECTED NOT DETECTED Final   Enterobacteriaceae species NOT DETECTED NOT DETECTED Final   Enterobacter cloacae complex NOT DETECTED NOT DETECTED Final   Escherichia coli NOT DETECTED NOT DETECTED Final   Klebsiella oxytoca NOT DETECTED NOT DETECTED Final   Klebsiella pneumoniae NOT DETECTED NOT DETECTED Final   Proteus species NOT DETECTED NOT DETECTED Final   Serratia marcescens NOT DETECTED NOT DETECTED Final   Haemophilus influenzae NOT DETECTED NOT DETECTED Final   Neisseria meningitidis NOT DETECTED NOT DETECTED Final   Pseudomonas aeruginosa NOT DETECTED NOT DETECTED Final   Candida albicans NOT DETECTED NOT DETECTED Final   Candida glabrata NOT DETECTED NOT DETECTED Final   Candida krusei NOT DETECTED NOT DETECTED Final   Candida parapsilosis NOT DETECTED NOT DETECTED Final   Candida tropicalis NOT DETECTED NOT DETECTED Final    Comment: Performed at [facility], [place] [address], [place], [place] [address]  Culture, Urine     Status: None   Collection Time: 05/04/17  2:00 PM  Result Value Ref Range Status   Specimen Description URINE, RANDOM  Final   Special Requests NONE  Final   Culture   Final    NO GROWTH Performed at [facility], [place] [address], [place], [place]  [address]    Report Status 05/06/2017 FINAL  Final  Culture, Urine     Status: None   Collection Time: 05/04/17  2:18 PM  Result Value Ref Range Status   Specimen Description URINE, CATHETERIZED  Final   Special Requests NONE  Final   Culture   Final    NO GROWTH Performed at [facility], [place] [address] [place], [place] [address]    Report Status 05/06/2017 FINAL  Final  Culture, blood (Routine X 2) w Reflex to ID Panel     Status: None (Preliminary result)   Collection Time: 05/05/17 12:00 PM  Result Value Ref Range Status   Specimen Description BLOOD RIGHT HAND  Final   Special Requests   Final    BOTTLES DRAWN AEROBIC AND ANAEROBIC Blood Culture adequate volume   Culture NO GROWTH 2 DAYS  Final   Report Status PENDING  Incomplete  Culture, blood (Routine X 2) w Reflex to ID Panel     Status: None (Preliminary result)   Collection Time: 05/05/17 12:20 PM  Result Value Ref Range Status   Specimen Description BLOOD LEFT HAND  Final   Special Requests   Final    BOTTLES DRAWN AEROBIC AND ANAEROBIC Blood Culture adequate volume   Culture NO GROWTH 2 DAYS  Final   Report Status PENDING  Incomplete     Scheduled Meds: . ALPRAZolam  0.5 mg Oral TID  . amLODipine  5 mg Oral Daily  . carvedilol  6.25 mg Oral BID WC  . diclofenac sodium  4 g Topical QID  . docusate sodium  100 mg Oral BID  . enoxaparin (LOVENOX) injection  40 mg Subcutaneous Q24H  . furosemide  40 mg Intravenous BID  . [START ON 05/08/2017]  furosemide  40 mg Oral Daily  . insulin aspart  0-15 Units Subcutaneous TID WC  . insulin aspart  0-5 Units Subcutaneous QHS  . insulin aspart  5 Units Subcutaneous TID WC  . insulin glargine  10 Units Subcutaneous QHS  . potassium chloride  20 mEq Oral Daily  . prednisoLONE acetate  1 drop Both Eyes QID  . triamcinolone cream   Topical BID   Continuous Infusions:  Procedures/Studies: Ct Head Wo Contrast  Result Date: 04/30/2017 CLINICAL DATA:  Acute development  of altered mental status today. EXAM: CT HEAD WITHOUT CONTRAST TECHNIQUE: Contiguous axial images were obtained from the base of the skull through the vertex without intravenous contrast. COMPARISON:  09/01/2016 FINDINGS: Brain: Mild age related volume loss. No sign of acute infarction, mass lesion, hemorrhage, hydrocephalus or extra-axial collection. Vascular: No abnormal vascular finding. Skull: Normal Sinuses/Orbits: Clear/normal Other: None IMPRESSION: No change.  Normal head CT for age.  Mild volume loss. Electronically Signed   By: Nelson Chimes M.D.   On: 04/30/2017 09:07   US Venous Img Lower Unilateral Left  Result Date: 04/30/2017 CLINICAL DATA:  Calf region pain and edema EXAM: LEFT LOWER EXTREMITY VENOUS DUPLEX ULTRASOUND TECHNIQUE: Gray-scale sonography with graded compression, as well as color Doppler and duplex ultrasound were performed to evaluate the left lower extremity deep venous system from the level of the common femoral vein and including the common femoral, femoral, profunda femoral, popliteal and calf veins including the posterior tibial, peroneal and gastrocnemius veins when visible. The superficial great saphenous vein was also interrogated. Spectral Doppler was utilized to evaluate flow at rest and with distal augmentation maneuvers in the common femoral, femoral and popliteal veins. COMPARISON:  None. FINDINGS: Contralateral Common Femoral Vein: Respiratory phasicity is normal and symmetric with the symptomatic side. No evidence of thrombus. Normal compressibility. Common Femoral Vein: No evidence of thrombus. Normal compressibility, respiratory phasicity and response to augmentation. Saphenofemoral Junction: No evidence of thrombus. Normal compressibility and flow on color Doppler imaging. Profunda Femoral Vein: No evidence of thrombus. Normal compressibility and flow on color Doppler imaging. Femoral Vein: No evidence of thrombus. Normal compressibility, respiratory phasicity and  response to augmentation. Popliteal Vein: No evidence of thrombus. Normal compressibility, respiratory phasicity and response to augmentation. Calf Veins: No evidence of thrombus. Normal compressibility and flow on color Doppler imaging. Superficial Great Saphenous Vein: No evidence of thrombus. Normal compressibility. Venous Reflux:  None. Other Findings: There is moderate narrowing of the mid to distal left superficial femoral artery with collateral flow in this area. There is a popliteal fossa cyst measuring 3.0 x 1.5 x 2.9 cm IMPRESSION: 1. No evidence of deep venous thrombosis in the left lower extremity. Right common femoral artery also patent. 2. Obstructing lesion mid to distal left superficial femoral artery with atherosclerotic plaque in this area. Collateral flow seen in this area. 3. Baker cyst in the left popliteal fossa measuring 3.0 x 1.5 x 2.9 cm. Electronically Signed   By: Lowella Grip III M.D.   On: 04/30/2017 09:52   Dg Chest Port 1 View  Result Date: 05/04/2017 CLINICAL DATA:  Fever. EXAM: PORTABLE CHEST 1 VIEW COMPARISON:  Chest x-ray dated April 30, 2017. FINDINGS: Mild cardiomegaly and pulmonary vascular congestion. Low lung volumes with persistent increased opacity at the left lung base. No large pleural effusion. No pneumothorax. No acute osseous abnormality. IMPRESSION: Cardiomegaly and pulmonary vascular congestion. Persistent increased opacity at the left lung base, which could reflect atelectasis or pneumonia. Electronically Signed  By: Titus Dubin M.D.   On: 05/04/2017 10:19   Dg Chest Port 1 View  Result Date: 04/30/2017 CLINICAL DATA:  Hypothermia. History of colon cancer, CHF, diabetes, hypertension. Nonsmoker. EXAM: PORTABLE CHEST 1 VIEW COMPARISON:  02/11/2017 FINDINGS: Shallow inspiration. Cardiac enlargement with pulmonary vascular congestion. Infiltration in the lung bases may represent early edema. No consolidation. No pleural effusions. No pneumothorax.  Degenerative changes in the spine and shoulders. IMPRESSION: New cardiac enlargement, pulmonary vascular congestion, and mild basilar edema. Electronically Signed   By: Lucienne Capers M.D.   On: 04/30/2017 06:31    Maurion Walkowiak, DO  [facility] Pager [phone]  If 7PM-7AM, please contact night-coverage [email or web address] Password [email or web address] 05/07/2017, 4:43 PM   LOS: 3 days

## 2017-05-07 NOTE — Care Management Note (Signed)
Case Management Note  Patient Details  Name: BULAR HICKOK MRN: [id number] Date of Birth: 10-Mar-1931  If discussed at Long Length of Stay Meetings, dates discussed:  05/07/2017  Additional Comments:  Sherald Barge, RN 05/07/2017, 3:39 PM

## 2017-05-08 DIAGNOSIS — J45998 Other asthma: Secondary | ICD-10-CM | POA: Diagnosis not present

## 2017-05-08 DIAGNOSIS — E7849 Other hyperlipidemia: Secondary | ICD-10-CM | POA: Diagnosis not present

## 2017-05-08 DIAGNOSIS — M6281 Muscle weakness (generalized): Secondary | ICD-10-CM | POA: Diagnosis not present

## 2017-05-08 DIAGNOSIS — I5033 Acute on chronic diastolic (congestive) heart failure: Secondary | ICD-10-CM | POA: Diagnosis not present

## 2017-05-08 DIAGNOSIS — R509 Fever, unspecified: Secondary | ICD-10-CM | POA: Diagnosis not present

## 2017-05-08 DIAGNOSIS — E162 Hypoglycemia, unspecified: Secondary | ICD-10-CM | POA: Diagnosis not present

## 2017-05-08 DIAGNOSIS — R278 Other lack of coordination: Secondary | ICD-10-CM | POA: Diagnosis not present

## 2017-05-08 DIAGNOSIS — R0602 Shortness of breath: Secondary | ICD-10-CM | POA: Diagnosis not present

## 2017-05-08 DIAGNOSIS — J9601 Acute respiratory failure with hypoxia: Secondary | ICD-10-CM | POA: Diagnosis not present

## 2017-05-08 DIAGNOSIS — I10 Essential (primary) hypertension: Secondary | ICD-10-CM | POA: Diagnosis not present

## 2017-05-08 DIAGNOSIS — I5022 Chronic systolic (congestive) heart failure: Secondary | ICD-10-CM | POA: Diagnosis not present

## 2017-05-08 DIAGNOSIS — J9600 Acute respiratory failure, unspecified whether with hypoxia or hypercapnia: Secondary | ICD-10-CM | POA: Diagnosis not present

## 2017-05-08 DIAGNOSIS — Z888 Allergy status to other drugs, medicaments and biological substances status: Secondary | ICD-10-CM | POA: Diagnosis not present

## 2017-05-08 DIAGNOSIS — R2689 Other abnormalities of gait and mobility: Secondary | ICD-10-CM | POA: Diagnosis not present

## 2017-05-08 DIAGNOSIS — J708 Respiratory conditions due to other specified external agents: Secondary | ICD-10-CM | POA: Diagnosis not present

## 2017-05-08 DIAGNOSIS — E119 Type 2 diabetes mellitus without complications: Secondary | ICD-10-CM | POA: Diagnosis not present

## 2017-05-08 DIAGNOSIS — E785 Hyperlipidemia, unspecified: Secondary | ICD-10-CM | POA: Diagnosis not present

## 2017-05-08 DIAGNOSIS — C189 Malignant neoplasm of colon, unspecified: Secondary | ICD-10-CM | POA: Diagnosis not present

## 2017-05-08 LAB — GLUCOSE, CAPILLARY
GLUCOSE-CAPILLARY: 294 mg/dL — AB (ref 65–99)
Glucose-Capillary: 230 mg/dL — ABNORMAL HIGH (ref 65–99)

## 2017-05-08 LAB — CBC
HCT: 36.5 % (ref 36.0–46.0)
Hemoglobin: 11.9 g/dL — ABNORMAL LOW (ref 12.0–15.0)
MCH: 29 pg (ref 26.0–34.0)
MCHC: 32.6 g/dL (ref 30.0–36.0)
MCV: 89 fL (ref 78.0–100.0)
PLATELETS: 347 10*3/uL (ref 150–400)
RBC: 4.1 MIL/uL (ref 3.87–5.11)
RDW: 13.5 % (ref 11.5–15.5)
WBC: 6.8 10*3/uL (ref 4.0–10.5)

## 2017-05-08 LAB — BASIC METABOLIC PANEL
Anion gap: 10 (ref 5–15)
BUN: 47 mg/dL — AB (ref 6–20)
CALCIUM: 9 mg/dL (ref 8.9–10.3)
CO2: 26 mmol/L (ref 22–32)
CREATININE: 1.02 mg/dL — AB (ref 0.44–1.00)
Chloride: 101 mmol/L (ref 101–111)
GFR calc Af Amer: 57 mL/min — ABNORMAL LOW (ref >60)
GFR, EST NON AFRICAN AMERICAN: 49 mL/min — AB (ref >60)
Glucose, Bld: 235 mg/dL — ABNORMAL HIGH (ref 65–99)
Potassium: 4 mmol/L (ref 3.5–5.1)
SODIUM: 137 mmol/L (ref 135–145)

## 2017-05-08 MED ORDER — POTASSIUM CHLORIDE CRYS ER 10 MEQ PO TBCR: 10.0000 meq | EXTENDED_RELEASE_TABLET | Freq: Every day | ORAL | 0 refills | Status: AC

## 2017-05-08 MED ORDER — FUROSEMIDE 40 MG PO TABS: 40.0000 mg | ORAL_TABLET | Freq: Every morning | ORAL | 1 refills | Status: DC

## 2017-05-08 MED ORDER — POTASSIUM CHLORIDE CRYS ER 20 MEQ PO TBCR: 20.0000 meq | EXTENDED_RELEASE_TABLET | Freq: Every day | ORAL | 0 refills | Status: DC

## 2017-05-08 MED ORDER — CARVEDILOL 6.25 MG PO TABS: 6.2500 mg | ORAL_TABLET | Freq: Two times a day (BID) | ORAL | 1 refills | Status: DC

## 2017-05-08 MED ORDER — ALPRAZOLAM 0.5 MG PO TABS: 0.5000 mg | ORAL_TABLET | Freq: Three times a day (TID) | ORAL | 0 refills | Status: DC

## 2017-05-08 MED ORDER — DICLOFENAC SODIUM 1 % TD GEL: 4.0000 g | Freq: Four times a day (QID) | TRANSDERMAL | 0 refills | Status: DC

## 2017-05-08 MED ORDER — FUROSEMIDE 20 MG PO TABS: 20.0000 mg | ORAL_TABLET | Freq: Every day | ORAL | Status: DC

## 2017-05-08 MED ORDER — FUROSEMIDE 20 MG PO TABS: 20.0000 mg | ORAL_TABLET | Freq: Every day | ORAL | 1 refills | Status: DC

## 2017-05-08 MED ORDER — INSULIN GLARGINE 100 UNIT/ML ~~LOC~~ SOLN: 20.0000 [IU] | Freq: Every day | SUBCUTANEOUS | 0 refills | Status: DC

## 2017-05-08 MED ORDER — HUMALOG KWIKPEN 200 UNIT/ML ~~LOC~~ SOPN: 0.0000 [IU] | PEN_INJECTOR | Freq: Three times a day (TID) | SUBCUTANEOUS | 0 refills | Status: AC

## 2017-05-08 NOTE — Progress Notes (Signed)
NURSING PROGRESS NOTE  [name] [id number] Discharge Data: 05/08/2017 2:28 PM Attending Provider: Orson Eva, MD TOI:ZTIW, Edwinna Areola, MD     Terisa Starr to be D/C'd Skilled nursing facility  per MD order.  Discussed with the patient the After Visit Summary and all questions fully answered. All IV's discontinued with no bleeding noted. All belongings returned to patient for patient to take home. Report called to [facility] and given to Jacques Navy  Last Vital Signs:  Blood pressure (!) 158/54, pulse 66, temperature 97.9 F (36.6 C), temperature source Oral, resp. rate 20, height 5\' 4"  (1.626 m), weight 75.6 kg (166 lb 10.7 oz), SpO2 93 %.  Discharge Medication List Allergies as of 05/08/2017      Reactions   Ace Inhibitors Dermatitis   Glipizide    rash   Neosporin [neomycin-bacitracin Zn-polymyx] Dermatitis   Spironolactone    rash   Diovan [valsartan] Rash      Medication List    STOP taking these medications   labetalol 100 MG tablet Commonly known as:  NORMODYNE   pravastatin 40 MG tablet Commonly known as:  PRAVACHOL   TOUJEO SOLOSTAR [place] Replaced by:  insulin glargine 100 UNIT/ML injection     TAKE these medications   ALPRAZolam 0.5 MG tablet Commonly known as:  XANAX Take 1 tablet (0.5 mg total) by mouth 3 (three) times daily. What changed:  when to take this  reasons to take this   amLODipine 5 MG tablet Commonly known as:  NORVASC Take 2 tablets (10 mg total) by mouth daily. What changed:  how much to take   BIOTIN PO Take 1 tablet by mouth daily.   carvedilol 6.25 MG tablet Commonly known as:  COREG Take 1 tablet (6.25 mg total) by mouth 2 (two) times daily with a meal.   cetirizine 10 MG tablet Commonly known as:  ZYRTEC Take 10 mg by mouth daily.   diclofenac sodium 1 % Gel Commonly known as:  VOLTAREN Apply 4 g topically 4 (four) times daily. To knees   docusate sodium 100 MG capsule Commonly known as:  COLACE Take 1 capsule  (100 mg total) by mouth 2 (two) times daily.   furosemide 40 MG tablet Commonly known as:  LASIX Take 1 tablet (40 mg total) by mouth every morning. What changed:  when to take this   furosemide 20 MG tablet Commonly known as:  LASIX Take 1 tablet (20 mg total) by mouth daily before supper. What changed:  You were already taking a medication with the same name, and this prescription was added. Make sure you understand how and when to take each.   glipiZIDE 10 MG 24 hr tablet Commonly known as:  GLUCOTROL XL Take 1 tablet by mouth daily.   HUMALOG KWIKPEN 200 UNIT/ML Sopn Generic drug:  Insulin Lispro Inject 0-15 Units into the skin 3 (three) times daily with meals. CBG 70-120--no units; 121-150--2 units; 151-200--3 units; 201-250--5 units; 251-300--8 units; 301-350--11 units; 351-400--15 units What changed:  See the new instructions.   insulin glargine 100 UNIT/ML injection Commonly known as:  LANTUS Inject 0.2 mLs (20 Units total) into the skin at bedtime. Replaces:  TOUJEO SOLOSTAR [place]   multivitamin tablet Take 1 tablet by mouth daily.   multivitamin-lutein Caps capsule Take 1 capsule by mouth daily.   potassium chloride 10 MEQ tablet Commonly known as:  K-DUR,KLOR-CON Take 1 tablet (10 mEq total) by mouth daily.   prednisoLONE acetate 1 %  ophthalmic suspension Commonly known as:  PRED FORTE Place 1 drop into both eyes 4 (four) times daily.   triamcinolone cream 0.1 % Commonly known as:  KENALOG APPLY TWICE DAILY

## 2017-05-08 NOTE — Discharge Summary (Signed)
Physician Discharge Summary  [name] VPX:[id number] DOB: 1931/02/22 DOA: 04/30/2017  PCP: [name], MD  Admit date: 04/30/2017 Discharge date: 05/08/2017  Admitted From: HOME Disposition:  SNF  Recommendations for Outpatient Follow-up:  1. Follow up with PCP in 1-2 weeks 2. Please obtain BMP/CBC in one week    Discharge Condition: Stable CODE STATUS:FULL Diet recommendation: Heart Healthy / Carb Modified   Brief/Interim Summary: 83 y.o.femalewith history of hypertension, diabetes mellitus type 2, chronic diastolic heart failure, erythroderma desquamativum, colon cancer s/p resection in 2006, arthritis and anxiety who presented with unresponsiveness. She was found to be hypoglycemic and was transferred to the ER where she was hypothermic. She recently changed to Toujeo 65 units q hs one week prior to admissionand she also has a UTI. She was hypoglycemic for several days and her CBG finally stabilized off of dextrose 10/22. Overnight 05/04/17, however, she has developed a fever despite being on ceftriaxone for her UTI.  This was thought to be due to atelectasis.  The patient remained afebrile thereafter and hemodynamically stable.  The patient was started on intravenous furosemide for her acute on chronic diastolic CHF with good clinical effect.  Discharge Diagnoses:  Acute respiratory failure with hypoxia -Secondary to CHF -Improved with diuresis -Presently stable on room air  Acute on chronic diastolic CHF -May 16, 2016 Echo EF 60-65%, grade 2 DD -NEG 11 lbs since admission -Continue intravenous furosemide>>po furosemide on 10/26 -Continue daily weights -Discharge weight 166 -Added low-dose carvedilol  Fever -Suspected drug fever -Has remained afebrile nearly 96 hours -Repeat urine culture without growth -Remain off antibiotics -Remains hemodynamically stable -Personally reviewed chest x-ray May 04, 2017--pulmonary vascular congestion with left  basilar opacity -Clinically does not have pneumonia as her respiratory status continues to improve  Hypoglycemia/Diabetes Mellitus type 2 -Secondary to recent change in insulin regimen and UTI -Resolved -TSH 0.540 -increase Lantus to 20 units daily -d/c with humalog sliding scale  Hypokalemia -Replete -Check magnesium--1.9  Enterobacter UTI -Treated with 5 days of ceftriaxone  Left calf pain and swelling, improved.  -TTP along patellar tendon but chronic problem per patient, not worse than it's been at home. She can still move knee. Highly Doubt septic arthritis.  - Lower extremity duplex: Peripheral vascular disease and Baker's cyst - Continue tylenol and prn ibuprofen - Continue voltaren gel  essential hypertension -Continue amlodipine -increase carvedilol to 6.25 mg bid  Anxiety,stable, continue xanax  HLD, chronic stable, continue pravastatin  Rash, RN and pharmacist have thoroughly reviewed medications and discovered pravastatin can cause a psoriasis like skin rash.  - Needs dermatology follow up - d/c'd pravastatin - continue steroid creams   Discharge Instructions  Discharge Instructions    Diet - low sodium heart healthy    Complete by:  As directed    Increase activity slowly    Complete by:  As directed      Allergies as of 05/08/2017      Reactions   Ace Inhibitors Dermatitis   Glipizide    rash   Neosporin [neomycin-bacitracin Zn-polymyx] Dermatitis   Spironolactone    rash   Diovan [valsartan] Rash      Medication List    STOP taking these medications   labetalol 100 MG tablet Commonly known as:  NORMODYNE   TOUJEO SOLOSTAR [place] Replaced by:  insulin glargine 100 UNIT/ML injection     TAKE these medications   ALPRAZolam 0.5 MG tablet Commonly known as:  XANAX Take 1 tablet (0.5 mg total) by  mouth 3 (three) times daily. What changed:  when to take this  reasons to take this   amLODipine 5 MG tablet Commonly  known as:  NORVASC Take 2 tablets (10 mg total) by mouth daily. What changed:  how much to take   BIOTIN PO Take 1 tablet by mouth daily.   carvedilol 6.25 MG tablet Commonly known as:  COREG Take 1 tablet (6.25 mg total) by mouth 2 (two) times daily with a meal.   cetirizine 10 MG tablet Commonly known as:  ZYRTEC Take 10 mg by mouth daily.   diclofenac sodium 1 % Gel Commonly known as:  VOLTAREN Apply 4 g topically 4 (four) times daily. To knees   docusate sodium 100 MG capsule Commonly known as:  COLACE Take 1 capsule (100 mg total) by mouth 2 (two) times daily.   furosemide 40 MG tablet Commonly known as:  LASIX Take 1 tablet (40 mg total) by mouth every morning. What changed:  when to take this   furosemide 20 MG tablet Commonly known as:  LASIX Take 1 tablet (20 mg total) by mouth daily before supper. What changed:  You were already taking a medication with the same name, and this prescription was added. Make sure you understand how and when to take each.   glipiZIDE 10 MG 24 hr tablet Commonly known as:  GLUCOTROL XL Take 1 tablet by mouth daily.   HUMALOG KWIKPEN 200 UNIT/ML Sopn Generic drug:  Insulin Lispro Inject 0-15 Units into the skin 3 (three) times daily with meals. CBG 70-120--no units; 121-150--2 units; 151-200--3 units; 201-250--5 units; 251-300--8 units; 301-350--11 units; 351-400--15 units What changed:  See the new instructions.   insulin glargine 100 UNIT/ML injection Commonly known as:  LANTUS Inject 0.2 mLs (20 Units total) into the skin at bedtime. Replaces:  TOUJEO SOLOSTAR [place]   multivitamin tablet Take 1 tablet by mouth daily.   multivitamin-lutein Caps capsule Take 1 capsule by mouth daily.   potassium chloride 10 MEQ tablet Commonly known as:  K-DUR,KLOR-CON Take 1 tablet (10 mEq total) by mouth daily.   pravastatin 40 MG tablet Commonly known as:  PRAVACHOL Take 40 mg by mouth daily.   prednisoLONE acetate 1 % ophthalmic  suspension Commonly known as:  PRED FORTE Place 1 drop into both eyes 4 (four) times daily.   triamcinolone cream 0.1 % Commonly known as:  KENALOG APPLY TWICE DAILY      Contact information for after-discharge care    [facility] .   Specialty:  [place] information: [address] [place] [phone]             Allergies  Allergen Reactions  . Ace Inhibitors Dermatitis  . Glipizide     rash  . Neosporin [Neomycin-Bacitracin Zn-Polymyx] Dermatitis  . Spironolactone     rash  . Diovan [Valsartan] Rash    Consultations:  none   Procedures/Studies: Ct Head Wo Contrast  Result Date: 04/30/2017 CLINICAL DATA:  Acute development of altered mental status today. EXAM: CT HEAD WITHOUT CONTRAST TECHNIQUE: Contiguous axial images were obtained from the base of the skull through the vertex without intravenous contrast. COMPARISON:  09/01/2016 FINDINGS: Brain: Mild age related volume loss. No sign of acute infarction, mass lesion, hemorrhage, hydrocephalus or extra-axial collection. Vascular: No abnormal vascular finding. Skull: Normal Sinuses/Orbits: Clear/normal Other: None IMPRESSION: No change.  Normal head CT for age.  Mild volume loss. Electronically Signed  By: Nelson Chimes M.D.   On: 04/30/2017 09:07   US Venous Img Lower Unilateral Left  Result Date: 04/30/2017 CLINICAL DATA:  Calf region pain and edema EXAM: LEFT LOWER EXTREMITY VENOUS DUPLEX ULTRASOUND TECHNIQUE: Gray-scale sonography with graded compression, as well as color Doppler and duplex ultrasound were performed to evaluate the left lower extremity deep venous system from the level of the common femoral vein and including the common femoral, femoral, profunda femoral, popliteal and calf veins including the posterior tibial, peroneal and gastrocnemius veins when visible. The superficial great saphenous vein was also  interrogated. Spectral Doppler was utilized to evaluate flow at rest and with distal augmentation maneuvers in the common femoral, femoral and popliteal veins. COMPARISON:  None. FINDINGS: Contralateral Common Femoral Vein: Respiratory phasicity is normal and symmetric with the symptomatic side. No evidence of thrombus. Normal compressibility. Common Femoral Vein: No evidence of thrombus. Normal compressibility, respiratory phasicity and response to augmentation. Saphenofemoral Junction: No evidence of thrombus. Normal compressibility and flow on color Doppler imaging. Profunda Femoral Vein: No evidence of thrombus. Normal compressibility and flow on color Doppler imaging. Femoral Vein: No evidence of thrombus. Normal compressibility, respiratory phasicity and response to augmentation. Popliteal Vein: No evidence of thrombus. Normal compressibility, respiratory phasicity and response to augmentation. Calf Veins: No evidence of thrombus. Normal compressibility and flow on color Doppler imaging. Superficial Great Saphenous Vein: No evidence of thrombus. Normal compressibility. Venous Reflux:  None. Other Findings: There is moderate narrowing of the mid to distal left superficial femoral artery with collateral flow in this area. There is a popliteal fossa cyst measuring 3.0 x 1.5 x 2.9 cm IMPRESSION: 1. No evidence of deep venous thrombosis in the left lower extremity. Right common femoral artery also patent. 2. Obstructing lesion mid to distal left superficial femoral artery with atherosclerotic plaque in this area. Collateral flow seen in this area. 3. Baker cyst in the left popliteal fossa measuring 3.0 x 1.5 x 2.9 cm. Electronically Signed   By: Lowella Grip III M.D.   On: 04/30/2017 09:52   Dg Chest Port 1 View  Result Date: 05/04/2017 CLINICAL DATA:  Fever. EXAM: PORTABLE CHEST 1 VIEW COMPARISON:  Chest x-ray dated April 30, 2017. FINDINGS: Mild cardiomegaly and pulmonary vascular congestion. Low lung  volumes with persistent increased opacity at the left lung base. No large pleural effusion. No pneumothorax. No acute osseous abnormality. IMPRESSION: Cardiomegaly and pulmonary vascular congestion. Persistent increased opacity at the left lung base, which could reflect atelectasis or pneumonia. Electronically Signed   By: Titus Dubin M.D.   On: 05/04/2017 10:19   Dg Chest Port 1 View  Result Date: 04/30/2017 CLINICAL DATA:  Hypothermia. History of colon cancer, CHF, diabetes, hypertension. Nonsmoker. EXAM: PORTABLE CHEST 1 VIEW COMPARISON:  02/11/2017 FINDINGS: Shallow inspiration. Cardiac enlargement with pulmonary vascular congestion. Infiltration in the lung bases may represent early edema. No consolidation. No pleural effusions. No pneumothorax. Degenerative changes in the spine and shoulders. IMPRESSION: New cardiac enlargement, pulmonary vascular congestion, and mild basilar edema. Electronically Signed   By: Lucienne Capers M.D.   On: 04/30/2017 06:31        Discharge Exam: Vitals:   05/07/17 2210 05/08/17 0510  BP: (!) 166/60 (!) 158/54  Pulse: 64 66  Resp: 20 20  Temp: 97.9 F (36.6 C) 97.9 F (36.6 C)  SpO2: 98% 93%   Vitals:   05/07/17 1315 05/07/17 2008 05/07/17 2210 05/08/17 0510  BP:   (!) 166/60 (!) 158/54  Pulse:  64 66  Resp:   20 20  Temp:   97.9 F (36.6 C) 97.9 F (36.6 C)  TempSrc:   Oral Oral  SpO2: 98% 99% 98% 93%  Weight:    75.6 kg (166 lb 10.7 oz)  Height:        General: Pt is alert, awake, not in acute distress Cardiovascular: RRR, S1/S2 +, no rubs, no gallops Respiratory: CTA bilaterally, no wheezing, no rhonchi Abdominal: Soft, NT, ND, bowel sounds + Extremities: no edema, no cyanosis   The results of significant diagnostics from this hospitalization (including imaging, microbiology, ancillary and laboratory) are listed below for reference.    Significant Diagnostic Studies: Ct Head Wo Contrast  Result Date: 04/30/2017 CLINICAL  DATA:  Acute development of altered mental status today. EXAM: CT HEAD WITHOUT CONTRAST TECHNIQUE: Contiguous axial images were obtained from the base of the skull through the vertex without intravenous contrast. COMPARISON:  09/01/2016 FINDINGS: Brain: Mild age related volume loss. No sign of acute infarction, mass lesion, hemorrhage, hydrocephalus or extra-axial collection. Vascular: No abnormal vascular finding. Skull: Normal Sinuses/Orbits: Clear/normal Other: None IMPRESSION: No change.  Normal head CT for age.  Mild volume loss. Electronically Signed   By: Nelson Chimes M.D.   On: 04/30/2017 09:07   US Venous Img Lower Unilateral Left  Result Date: 04/30/2017 CLINICAL DATA:  Calf region pain and edema EXAM: LEFT LOWER EXTREMITY VENOUS DUPLEX ULTRASOUND TECHNIQUE: Gray-scale sonography with graded compression, as well as color Doppler and duplex ultrasound were performed to evaluate the left lower extremity deep venous system from the level of the common femoral vein and including the common femoral, femoral, profunda femoral, popliteal and calf veins including the posterior tibial, peroneal and gastrocnemius veins when visible. The superficial great saphenous vein was also interrogated. Spectral Doppler was utilized to evaluate flow at rest and with distal augmentation maneuvers in the common femoral, femoral and popliteal veins. COMPARISON:  None. FINDINGS: Contralateral Common Femoral Vein: Respiratory phasicity is normal and symmetric with the symptomatic side. No evidence of thrombus. Normal compressibility. Common Femoral Vein: No evidence of thrombus. Normal compressibility, respiratory phasicity and response to augmentation. Saphenofemoral Junction: No evidence of thrombus. Normal compressibility and flow on color Doppler imaging. Profunda Femoral Vein: No evidence of thrombus. Normal compressibility and flow on color Doppler imaging. Femoral Vein: No evidence of thrombus. Normal compressibility,  respiratory phasicity and response to augmentation. Popliteal Vein: No evidence of thrombus. Normal compressibility, respiratory phasicity and response to augmentation. Calf Veins: No evidence of thrombus. Normal compressibility and flow on color Doppler imaging. Superficial Great Saphenous Vein: No evidence of thrombus. Normal compressibility. Venous Reflux:  None. Other Findings: There is moderate narrowing of the mid to distal left superficial femoral artery with collateral flow in this area. There is a popliteal fossa cyst measuring 3.0 x 1.5 x 2.9 cm IMPRESSION: 1. No evidence of deep venous thrombosis in the left lower extremity. Right common femoral artery also patent. 2. Obstructing lesion mid to distal left superficial femoral artery with atherosclerotic plaque in this area. Collateral flow seen in this area. 3. Baker cyst in the left popliteal fossa measuring 3.0 x 1.5 x 2.9 cm. Electronically Signed   By: Lowella Grip III M.D.   On: 04/30/2017 09:52   Dg Chest Port 1 View  Result Date: 05/04/2017 CLINICAL DATA:  Fever. EXAM: PORTABLE CHEST 1 VIEW COMPARISON:  Chest x-ray dated April 30, 2017. FINDINGS: Mild cardiomegaly and pulmonary vascular congestion. Low lung volumes with persistent increased  opacity at the left lung base. No large pleural effusion. No pneumothorax. No acute osseous abnormality. IMPRESSION: Cardiomegaly and pulmonary vascular congestion. Persistent increased opacity at the left lung base, which could reflect atelectasis or pneumonia. Electronically Signed   By: Titus Dubin M.D.   On: 05/04/2017 10:19   Dg Chest Port 1 View  Result Date: 04/30/2017 CLINICAL DATA:  Hypothermia. History of colon cancer, CHF, diabetes, hypertension. Nonsmoker. EXAM: PORTABLE CHEST 1 VIEW COMPARISON:  02/11/2017 FINDINGS: Shallow inspiration. Cardiac enlargement with pulmonary vascular congestion. Infiltration in the lung bases may represent early edema. No consolidation. No pleural  effusions. No pneumothorax. Degenerative changes in the spine and shoulders. IMPRESSION: New cardiac enlargement, pulmonary vascular congestion, and mild basilar edema. Electronically Signed   By: Lucienne Capers M.D.   On: 04/30/2017 06:31     Microbiology: Recent Results (from the past 240 hour(s))  Culture, Urine     Status: Abnormal   Collection Time: 04/30/17  6:10 AM  Result Value Ref Range Status   Specimen Description URINE, CATHETERIZED  Final   Special Requests NONE  Final   Culture >=100,000 COLONIES/mL ENTEROBACTER AEROGENES (A)  Final   Report Status 05/02/2017 FINAL  Final   Organism ID, Bacteria ENTEROBACTER AEROGENES (A)  Final      Susceptibility   Enterobacter aerogenes - MIC*    CEFAZOLIN >=64 RESISTANT Resistant     CEFTRIAXONE <=1 SENSITIVE Sensitive     CIPROFLOXACIN <=0.25 SENSITIVE Sensitive     GENTAMICIN <=1 SENSITIVE Sensitive     IMIPENEM 1 SENSITIVE Sensitive     NITROFURANTOIN 64 INTERMEDIATE Intermediate     TRIMETH/SULFA <=20 SENSITIVE Sensitive     PIP/TAZO <=4 SENSITIVE Sensitive     * >=100,000 COLONIES/mL ENTEROBACTER AEROGENES  Culture, blood (routine x 2)     Status: None   Collection Time: 04/30/17  6:38 AM  Result Value Ref Range Status   Specimen Description BLOOD RIGHT ARM  Final   Special Requests   Final    BOTTLES DRAWN AEROBIC AND ANAEROBIC Blood Culture adequate volume   Culture NO GROWTH 5 DAYS  Final   Report Status 05/05/2017 FINAL  Final  Culture, blood (routine x 2)     Status: None   Collection Time: 04/30/17  6:50 AM  Result Value Ref Range Status   Specimen Description BLOOD RIGHT HAND  Final   Special Requests   Final    BOTTLES DRAWN AEROBIC ONLY Blood Culture adequate volume   Culture NO GROWTH 5 DAYS  Final   Report Status 05/05/2017 FINAL  Final  MRSA PCR Screening     Status: None   Collection Time: 05/01/17  1:05 AM  Result Value Ref Range Status   MRSA by PCR NEGATIVE NEGATIVE Final    Comment:        The  GeneXpert MRSA Assay (FDA approved for NASAL specimens only), is one component of a comprehensive MRSA colonization surveillance program. It is not intended to diagnose MRSA infection nor to guide or monitor treatment for MRSA infections.   Culture, blood (Routine X 2) w Reflex to ID Panel     Status: None (Preliminary result)   Collection Time: 05/04/17  8:19 AM  Result Value Ref Range Status   Specimen Description RIGHT ANTECUBITAL  Final   Special Requests   Final    BOTTLES DRAWN AEROBIC AND ANAEROBIC Blood Culture adequate volume   Culture NO GROWTH 4 DAYS  Final   Report Status PENDING  Incomplete  Culture, blood (Routine X 2) w Reflex to ID Panel     Status: Abnormal   Collection Time: 05/04/17  8:41 AM  Result Value Ref Range Status   Specimen Description BLOOD RIGHT HAND  Final   Special Requests   Final    BOTTLES DRAWN AEROBIC ONLY Blood Culture results may not be optimal due to an inadequate volume of blood received in culture bottles   Culture  Setup Time   Final    GRAM POSITIVE COCCI Gram Stain Report Called to,Read Back By and Verified With: COVINGTON L @ [id number] ON [id number] BY HENDERSON L. CRITICAL RESULT CALLED TO, READ BACK BY AND VERIFIED WITH: LIrma Newness.D. 14:30 05/05/17  ([email or web address])    Culture (A)  Final    STAPHYLOCOCCUS SPECIES (COAGULASE NEGATIVE) THE SIGNIFICANCE OF ISOLATING THIS ORGANISM FROM A SINGLE SET OF BLOOD CULTURES WHEN MULTIPLE SETS ARE DRAWN IS UNCERTAIN. PLEASE NOTIFY THE MICROBIOLOGY DEPARTMENT WITHIN ONE WEEK IF SPECIATION AND SENSITIVITIES ARE REQUIRED. Performed at [facility], [place] [address] [place], [place] [address]    Report Status 05/07/2017 FINAL  Final  Blood Culture ID Panel (Reflexed)     Status: Abnormal   Collection Time: 05/04/17  8:41 AM  Result Value Ref Range Status   Enterococcus species NOT DETECTED NOT DETECTED Final   Listeria monocytogenes NOT DETECTED NOT DETECTED Final   Staphylococcus species DETECTED  (A) NOT DETECTED Final    Comment: Methicillin (oxacillin) resistant coagulase negative staphylococcus. Possible blood culture contaminant (unless isolated from more than one blood culture draw or clinical case suggests pathogenicity). No antibiotic treatment is indicated for blood  culture contaminants. CRITICAL RESULT CALLED TO, READ BACK BY AND VERIFIED WITH: LIrma Newness.D. 14:30 05/05/17 ([email or web address])    Staphylococcus aureus NOT DETECTED NOT DETECTED Final   Methicillin resistance DETECTED (A) NOT DETECTED Final    Comment: CRITICAL RESULT CALLED TO, READ BACK BY AND VERIFIED WITH: LIrma Newness.D. 14:30 05/05/17 ([email or web address])    Streptococcus species NOT DETECTED NOT DETECTED Final   Streptococcus agalactiae NOT DETECTED NOT DETECTED Final   Streptococcus pneumoniae NOT DETECTED NOT DETECTED Final   Streptococcus pyogenes NOT DETECTED NOT DETECTED Final   Acinetobacter baumannii NOT DETECTED NOT DETECTED Final   Enterobacteriaceae species NOT DETECTED NOT DETECTED Final   Enterobacter cloacae complex NOT DETECTED NOT DETECTED Final   Escherichia coli NOT DETECTED NOT DETECTED Final   Klebsiella oxytoca NOT DETECTED NOT DETECTED Final   Klebsiella pneumoniae NOT DETECTED NOT DETECTED Final   Proteus species NOT DETECTED NOT DETECTED Final   Serratia marcescens NOT DETECTED NOT DETECTED Final   Haemophilus influenzae NOT DETECTED NOT DETECTED Final   Neisseria meningitidis NOT DETECTED NOT DETECTED Final   Pseudomonas aeruginosa NOT DETECTED NOT DETECTED Final   Candida albicans NOT DETECTED NOT DETECTED Final   Candida glabrata NOT DETECTED NOT DETECTED Final   Candida krusei NOT DETECTED NOT DETECTED Final   Candida parapsilosis NOT DETECTED NOT DETECTED Final   Candida tropicalis NOT DETECTED NOT DETECTED Final    Comment: Performed at [facility], [place] [address] [place], [place] [address]  Culture, Urine     Status: None   Collection Time: 05/04/17  2:00 PM  Result Value  Ref Range Status   Specimen Description URINE, RANDOM  Final   Special Requests NONE  Final   Culture   Final    NO GROWTH Performed at [facility], [place] [address], [place], [place]  [address]    Report Status 05/06/2017 FINAL  Final  Culture, Urine     Status: None   Collection Time: 05/04/17  2:18 PM  Result Value Ref Range Status   Specimen Description URINE, CATHETERIZED  Final   Special Requests NONE  Final   Culture   Final    NO GROWTH Performed at [facility], [address] [place], [place] [address]    Report Status 05/06/2017 FINAL  Final  Culture, blood (Routine X 2) w Reflex to ID Panel     Status: None (Preliminary result)   Collection Time: 05/05/17 12:00 PM  Result Value Ref Range Status   Specimen Description BLOOD RIGHT HAND  Final   Special Requests   Final    BOTTLES DRAWN AEROBIC AND ANAEROBIC Blood Culture adequate volume   Culture NO GROWTH 3 DAYS  Final   Report Status PENDING  Incomplete  Culture, blood (Routine X 2) w Reflex to ID Panel     Status: None (Preliminary result)   Collection Time: 05/05/17 12:20 PM  Result Value Ref Range Status   Specimen Description BLOOD LEFT HAND  Final   Special Requests   Final    BOTTLES DRAWN AEROBIC AND ANAEROBIC Blood Culture adequate volume   Culture NO GROWTH 3 DAYS  Final   Report Status PENDING  Incomplete     Labs: Basic Metabolic Panel:  Recent Labs Lab 05/04/17 0841 05/05/17 0551 05/06/17 0432 05/07/17 0543 05/08/17 0427  NA 136 137 135 138 137  K 3.8 3.7 3.3* 3.8 4.0  CL 99* 99* 99* 100* 101  CO2 27 28 27 27 26   GLUCOSE 192* 209* 210* 209* 235*  BUN 30* 31* 34* 38* 47*  CREATININE 1.03* 1.06* 0.96 0.95 1.02*  CALCIUM 8.8* 8.7* 8.7* 9.0 9.0  MG  --   --   --  1.9  --    Liver Function Tests: No results for input(s): AST, ALT, ALKPHOS, BILITOT, PROT, ALBUMIN in the last 168 hours. No results for input(s): LIPASE, AMYLASE in the last 168 hours. No results for input(s):  AMMONIA in the last 168 hours. CBC:  Recent Labs Lab 05/04/17 0841 05/05/17 0551 05/06/17 0432 05/07/17 0543 05/08/17 0427  WBC 9.7 7.8 7.2 6.7 6.8  NEUTROABS 6.0  --   --   --   --   HGB 12.3 11.3* 11.3* 11.8* 11.9*  HCT 37.3 35.4* 34.6* 35.9* 36.5  MCV 89.7 90.1 89.6 88.9 89.0  PLT 274 284 300 343 347   Cardiac Enzymes: No results for input(s): CKTOTAL, CKMB, CKMBINDEX, TROPONINI in the last 168 hours. BNP: Invalid input(s): POCBNP CBG:  Recent Labs Lab 05/07/17 1135 05/07/17 1616 05/07/17 2038 05/08/17 0827 05/08/17 1135  GLUCAP 204* 290* 265* 230* 294*    Time coordinating discharge:  Greater than 30 minutes  Signed:  Thurman Sarver, DO [facility] Pager: [phone] 05/08/2017, 11:46 AM

## 2017-05-08 NOTE — Care Management Important Message (Signed)
Important Message  Patient Details  Name: [name] MRN: [id number] Date of Birth: 1931-01-16   Medicare Important Message Given:  Yes    Sherald Barge, RN 05/08/2017, 11:51 AM

## 2017-05-08 NOTE — Progress Notes (Signed)
Physical Therapy Treatment Patient Details Name: [name] MRN: [id number] DOB: June 07, 1931 Today's Date: 05/08/2017    History of Present Illness [name] is a 83 y.o. female with history of hypertension, diabetes mellitus type 2, chronic diastolic heart failure, erythroderma desquamativum, colon cancer s/p resection in 2006, arthritis and anxiety who presents with unresponsiveness. She was apparently in her normal state of health except for feeling chilly yesterday according to her daughter and she was normal when she went to bed. This morning, around 4:30am when her daughter was getting ready to leave, she turned on the light and the patient was unresponsive in her bed. She had a little bit of spittle crusted around her mouth and did not open her eyes or respond to touch.  The daughter called EMS.  When EMS checked her blood sugar was 54 and she was given an amp of D50 which caused her blood sugars to rise to the 240s.    PT Comments    Patient demonstrates much improvement for bed mobility, transfers and gait requiring less assistance and able to complete sit to stands at bedside x 4 while having dressing put over buttock wounds.  Patient will benefit from continued physical therapy in hospital and recommended venue below to increase strength, balance, endurance for safe ADLs and gait.    Follow Up Recommendations  SNF;Supervision/Assistance - 24 hour     Equipment Recommendations  None recommended by PT    Recommendations for Other Services       Precautions / Restrictions Precautions Precautions: Fall Restrictions Weight Bearing Restrictions: No    Mobility  Bed Mobility Overal bed mobility: Needs Assistance Bed Mobility: Supine to Sit;Sit to Supine Rolling: Supervision   Supine to sit: Supervision Sit to supine: Supervision      Transfers Overall transfer level: Needs assistance Equipment used: Rolling walker (2 wheeled) Transfers: Sit to/from [employer] Sit to Stand: Min assist Stand pivot transfers: Min assist       General transfer comment: requires less assistance than last visit  Ambulation/Gait Ambulation/Gait assistance: Min assist Ambulation Distance (Feet): 25 Feet Assistive device: Rolling walker (2 wheeled) Gait Pattern/deviations: Decreased step length - right;Decreased step length - left;Decreased stride length   Gait velocity interpretation: Below normal speed for age/gender General Gait Details: Demonstrates slow labored cadence without loss of balance, limited secondary to c/o fatigue   Stairs            Wheelchair Mobility    Modified Rankin (Stroke Patients Only)       Balance Overall balance assessment: Needs assistance Sitting-balance support: Feet supported;No upper extremity supported Sitting balance-Leahy Scale: Good     Standing balance support: Bilateral upper extremity supported;During functional activity Standing balance-Leahy Scale: Fair                              Cognition Arousal/Alertness: Awake/alert Behavior During Therapy: WFL for tasks assessed/performed Overall Cognitive Status: Within Functional Limits for tasks assessed                                        Exercises General Exercises - Lower Extremity Long Arc Quad: Seated;AROM;Strengthening;15 reps;Both Hip Flexion/Marching: Seated;AROM;Strengthening;Both;10 reps Toe Raises: Seated;AROM;Strengthening;Both;10 reps Heel Raises: Seated;AROM;Strengthening;Both;10 reps    General Comments        Pertinent Vitals/Pain Pain Assessment: No/denies pain  Home Living                      Prior Function            PT Goals (current goals can now be found in the care plan section) Acute Rehab PT Goals Patient Stated Goal: return home after rehab PT Goal Formulation: With patient/family Time For Goal Achievement: 05/18/17 Potential to Achieve Goals:  Good Progress towards PT goals: Progressing toward goals    Frequency    Min 3X/week      PT Plan Current plan remains appropriate    Co-evaluation              AM-PAC PT "6 Clicks" Daily Activity  Outcome Measure  Difficulty turning over in bed (including adjusting bedclothes, sheets and blankets)?: None Difficulty moving from lying on back to sitting on the side of the bed? : None Difficulty sitting down on and standing up from a chair with arms (e.g., wheelchair, bedside commode, etc,.)?: Unable Help needed moving to and from a bed to chair (including a wheelchair)?: A Little Help needed walking in hospital room?: A Little Help needed climbing 3-5 steps with a railing? : A Lot 6 Click Score: 17    End of Session Equipment Utilized During Treatment: Gait belt Activity Tolerance: Patient tolerated treatment well;Patient limited by fatigue Patient left: in chair;with call bell/phone within reach;with family/visitor present Nurse Communication: Mobility status PT Visit Diagnosis: Unsteadiness on feet (R26.81);Other abnormalities of gait and mobility (R26.89);Muscle weakness (generalized) (M62.81)     Time: 4782-9562 PT Time Calculation (min) (ACUTE ONLY): 24 min  Charges:  $Therapeutic Activity: 23-37 mins                    G Codes:       11:59 AM, 2017/05/12 [name], MPT Physical Therapist with [facility] [phone] office [phone] mobile phone

## 2017-05-08 NOTE — Progress Notes (Signed)
Inpatient Diabetes Program Recommendations  AACE/ADA: New Consensus Statement on Inpatient Glycemic Control (2015)  Target Ranges:  Prepandial:   less than 140 mg/dL      Peak postprandial:   less than 180 mg/dL (1-2 hours)      Critically ill patients:  140 - 180 mg/dL   Lab Results  Component Value Date   GLUCAP 265 (H) 05/07/2017   HGBA1C 10.4 (H) 02/22/2017    Review of Glycemic Control  Diabetes history: DM 2 Home DM Meds: Toujeo 65 units QHS Humalog Glipizide 10 mg daily  Current Insulin Orders: Lantus 10 units, Novolog Sensitive Correction Scale/ SSI (0-9 units) TID AC + HS  MD- Note patient admitted with Hypoglycemia. Recently switched to Toujeo from Lantus insulin per family. Glucose elevated postprandially.  Please consider starting a small portion of pt's home dose of basal insulin:  Recommend Lantus 15 units daily (0.2 units/kg dosing to start-- This would be about 25% total home dose)  Thanks,  Tama Headings RN, MSN, [facility] Inpatient Diabetes Coordinator Team Pager [phone] (8a-5p)

## 2017-05-08 NOTE — Clinical Social Work Placement (Signed)
   CLINICAL SOCIAL WORK PLACEMENT  NOTE  Date:  05/08/2017  Patient Details  Name: [name] MRN: [id number] Date of Birth: 1931-01-30  Clinical Social Work is seeking post-discharge placement for this patient at the [place] level of care (*CSW will initial, date and re-position this form in  chart as items are completed):  Yes   Patient/family provided with [facility] Work Department's list of facilities offering this level of care within the geographic area requested by the patient (or if unable, by the patient's family).  Yes   Patient/family informed of their freedom to choose among providers that offer the needed level of care, that participate in Medicare, Medicaid or managed care program needed by the patient, have an available bed and are willing to accept the patient.  Yes   Patient/family informed of [facility]'s ownership interest in [facility] and [facility], as well as of the fact that they are under no obligation to receive care at these facilities.  PASRR submitted to EDS on 05/05/17     PASRR number received on 05/05/17     Existing PASRR number confirmed on       FL2 transmitted to all facilities in geographic area requested by pt/family on 05/05/17     FL2 transmitted to all facilities within larger geographic area on       Patient informed that his/her managed care company has contracts with or will negotiate with certain facilities, including the following:        Yes   Patient/family informed of bed offers received.  Patient chooses bed at [facility]     Physician recommends and patient chooses bed at      Patient to be transferred to [facility] on 05/08/17.  Patient to be transferred to facility by [facility]     Patient family notified on 05/08/17 of transfer.  Name of family member notified:  [name] Priddy, granddaughter     PHYSICIAN       Additional Comment:  LCSW left a  message for Mardene Celeste at [facility] advising of discharge and sent discharge clinicals.  LCSW signing off.  _______________________________________________ Ihor Gully, LCSW 05/08/2017, 1:37 PM

## 2017-05-09 LAB — CULTURE, BLOOD (ROUTINE X 2)
CULTURE: NO GROWTH
SPECIAL REQUESTS: ADEQUATE

## 2017-05-10 LAB — CULTURE, BLOOD (ROUTINE X 2)
Culture: NO GROWTH
Culture: NO GROWTH
SPECIAL REQUESTS: ADEQUATE
Special Requests: ADEQUATE

## 2017-05-15 DIAGNOSIS — R509 Fever, unspecified: Secondary | ICD-10-CM | POA: Diagnosis not present

## 2017-05-15 DIAGNOSIS — R0602 Shortness of breath: Secondary | ICD-10-CM | POA: Diagnosis not present

## 2017-05-29 DIAGNOSIS — E782 Mixed hyperlipidemia: Secondary | ICD-10-CM | POA: Diagnosis not present

## 2017-05-29 DIAGNOSIS — E1165 Type 2 diabetes mellitus with hyperglycemia: Secondary | ICD-10-CM | POA: Diagnosis not present

## 2017-05-29 DIAGNOSIS — I10 Essential (primary) hypertension: Secondary | ICD-10-CM | POA: Diagnosis not present

## 2017-05-29 DIAGNOSIS — D509 Iron deficiency anemia, unspecified: Secondary | ICD-10-CM | POA: Diagnosis not present

## 2017-06-01 DIAGNOSIS — E1165 Type 2 diabetes mellitus with hyperglycemia: Secondary | ICD-10-CM | POA: Diagnosis not present

## 2017-06-01 DIAGNOSIS — R945 Abnormal results of liver function studies: Secondary | ICD-10-CM | POA: Diagnosis not present

## 2017-06-13 DIAGNOSIS — J708 Respiratory conditions due to other specified external agents: Secondary | ICD-10-CM | POA: Diagnosis not present

## 2017-06-13 DIAGNOSIS — J45998 Other asthma: Secondary | ICD-10-CM | POA: Diagnosis not present

## 2017-07-14 DIAGNOSIS — J45998 Other asthma: Secondary | ICD-10-CM | POA: Diagnosis not present

## 2017-07-14 DIAGNOSIS — J708 Respiratory conditions due to other specified external agents: Secondary | ICD-10-CM | POA: Diagnosis not present

## 2017-08-14 DIAGNOSIS — J708 Respiratory conditions due to other specified external agents: Secondary | ICD-10-CM | POA: Diagnosis not present

## 2017-08-14 DIAGNOSIS — J45998 Other asthma: Secondary | ICD-10-CM | POA: Diagnosis not present

## 2017-09-18 DIAGNOSIS — E782 Mixed hyperlipidemia: Secondary | ICD-10-CM | POA: Diagnosis not present

## 2017-09-18 DIAGNOSIS — E1165 Type 2 diabetes mellitus with hyperglycemia: Secondary | ICD-10-CM | POA: Diagnosis not present

## 2017-09-18 DIAGNOSIS — I10 Essential (primary) hypertension: Secondary | ICD-10-CM | POA: Diagnosis not present

## 2017-09-23 DIAGNOSIS — E1165 Type 2 diabetes mellitus with hyperglycemia: Secondary | ICD-10-CM | POA: Diagnosis not present

## 2017-09-23 DIAGNOSIS — D509 Iron deficiency anemia, unspecified: Secondary | ICD-10-CM | POA: Diagnosis not present

## 2017-09-23 DIAGNOSIS — E1122 Type 2 diabetes mellitus with diabetic chronic kidney disease: Secondary | ICD-10-CM | POA: Diagnosis not present

## 2017-09-23 DIAGNOSIS — Z0000 Encounter for general adult medical examination without abnormal findings: Secondary | ICD-10-CM | POA: Diagnosis not present

## 2017-09-23 DIAGNOSIS — E782 Mixed hyperlipidemia: Secondary | ICD-10-CM | POA: Diagnosis not present

## 2018-01-18 ENCOUNTER — Encounter (HOSPITAL_BASED_OUTPATIENT_CLINIC_OR_DEPARTMENT_OTHER): Payer: Medicare Other | Attending: Internal Medicine

## 2018-01-18 DIAGNOSIS — L97812 Non-pressure chronic ulcer of other part of right lower leg with fat layer exposed: Secondary | ICD-10-CM | POA: Insufficient documentation

## 2018-01-18 DIAGNOSIS — I110 Hypertensive heart disease with heart failure: Secondary | ICD-10-CM | POA: Diagnosis not present

## 2018-01-18 DIAGNOSIS — I509 Heart failure, unspecified: Secondary | ICD-10-CM | POA: Diagnosis not present

## 2018-01-18 DIAGNOSIS — L97212 Non-pressure chronic ulcer of right calf with fat layer exposed: Secondary | ICD-10-CM | POA: Diagnosis not present

## 2018-01-18 DIAGNOSIS — I87311 Chronic venous hypertension (idiopathic) with ulcer of right lower extremity: Secondary | ICD-10-CM | POA: Diagnosis not present

## 2018-01-18 DIAGNOSIS — M069 Rheumatoid arthritis, unspecified: Secondary | ICD-10-CM | POA: Insufficient documentation

## 2018-01-18 DIAGNOSIS — Z9221 Personal history of antineoplastic chemotherapy: Secondary | ICD-10-CM | POA: Insufficient documentation

## 2018-01-18 DIAGNOSIS — L408 Other psoriasis: Secondary | ICD-10-CM | POA: Insufficient documentation

## 2018-01-18 DIAGNOSIS — E11622 Type 2 diabetes mellitus with other skin ulcer: Secondary | ICD-10-CM | POA: Diagnosis not present

## 2018-01-18 DIAGNOSIS — Z85038 Personal history of other malignant neoplasm of large intestine: Secondary | ICD-10-CM | POA: Diagnosis not present

## 2018-01-18 DIAGNOSIS — E1151 Type 2 diabetes mellitus with diabetic peripheral angiopathy without gangrene: Secondary | ICD-10-CM | POA: Insufficient documentation

## 2018-01-19 ENCOUNTER — Other Ambulatory Visit (HOSPITAL_BASED_OUTPATIENT_CLINIC_OR_DEPARTMENT_OTHER): Payer: Self-pay | Admitting: Internal Medicine

## 2018-01-19 DIAGNOSIS — L98499 Non-pressure chronic ulcer of skin of other sites with unspecified severity: Secondary | ICD-10-CM

## 2018-01-19 DIAGNOSIS — E11622 Type 2 diabetes mellitus with other skin ulcer: Secondary | ICD-10-CM

## 2018-01-19 DIAGNOSIS — L97211 Non-pressure chronic ulcer of right calf limited to breakdown of skin: Secondary | ICD-10-CM

## 2018-01-20 DIAGNOSIS — E1165 Type 2 diabetes mellitus with hyperglycemia: Secondary | ICD-10-CM | POA: Diagnosis not present

## 2018-01-20 DIAGNOSIS — Z0000 Encounter for general adult medical examination without abnormal findings: Secondary | ICD-10-CM | POA: Diagnosis not present

## 2018-01-20 DIAGNOSIS — E1122 Type 2 diabetes mellitus with diabetic chronic kidney disease: Secondary | ICD-10-CM | POA: Diagnosis not present

## 2018-01-20 DIAGNOSIS — D509 Iron deficiency anemia, unspecified: Secondary | ICD-10-CM | POA: Diagnosis not present

## 2018-01-25 DIAGNOSIS — Z9221 Personal history of antineoplastic chemotherapy: Secondary | ICD-10-CM | POA: Diagnosis not present

## 2018-01-25 DIAGNOSIS — D509 Iron deficiency anemia, unspecified: Secondary | ICD-10-CM | POA: Diagnosis not present

## 2018-01-25 DIAGNOSIS — E1122 Type 2 diabetes mellitus with diabetic chronic kidney disease: Secondary | ICD-10-CM | POA: Diagnosis not present

## 2018-01-25 DIAGNOSIS — I110 Hypertensive heart disease with heart failure: Secondary | ICD-10-CM | POA: Diagnosis not present

## 2018-01-25 DIAGNOSIS — E11622 Type 2 diabetes mellitus with other skin ulcer: Secondary | ICD-10-CM | POA: Diagnosis not present

## 2018-01-25 DIAGNOSIS — I87311 Chronic venous hypertension (idiopathic) with ulcer of right lower extremity: Secondary | ICD-10-CM | POA: Diagnosis not present

## 2018-01-25 DIAGNOSIS — L97812 Non-pressure chronic ulcer of other part of right lower leg with fat layer exposed: Secondary | ICD-10-CM | POA: Diagnosis not present

## 2018-01-25 DIAGNOSIS — E1151 Type 2 diabetes mellitus with diabetic peripheral angiopathy without gangrene: Secondary | ICD-10-CM | POA: Diagnosis not present

## 2018-01-25 DIAGNOSIS — Z0000 Encounter for general adult medical examination without abnormal findings: Secondary | ICD-10-CM | POA: Diagnosis not present

## 2018-01-25 DIAGNOSIS — L408 Other psoriasis: Secondary | ICD-10-CM | POA: Diagnosis not present

## 2018-01-25 DIAGNOSIS — N183 Chronic kidney disease, stage 3 (moderate): Secondary | ICD-10-CM | POA: Diagnosis not present

## 2018-01-25 DIAGNOSIS — E782 Mixed hyperlipidemia: Secondary | ICD-10-CM | POA: Diagnosis not present

## 2018-01-25 DIAGNOSIS — D649 Anemia, unspecified: Secondary | ICD-10-CM | POA: Diagnosis not present

## 2018-01-25 DIAGNOSIS — L97212 Non-pressure chronic ulcer of right calf with fat layer exposed: Secondary | ICD-10-CM | POA: Diagnosis not present

## 2018-01-25 DIAGNOSIS — I1310 Hypertensive heart and chronic kidney disease without heart failure, with stage 1 through stage 4 chronic kidney disease, or unspecified chronic kidney disease: Secondary | ICD-10-CM | POA: Diagnosis not present

## 2018-01-25 DIAGNOSIS — Z85038 Personal history of other malignant neoplasm of large intestine: Secondary | ICD-10-CM | POA: Diagnosis not present

## 2018-01-25 DIAGNOSIS — M069 Rheumatoid arthritis, unspecified: Secondary | ICD-10-CM | POA: Diagnosis not present

## 2018-01-25 DIAGNOSIS — E1165 Type 2 diabetes mellitus with hyperglycemia: Secondary | ICD-10-CM | POA: Diagnosis not present

## 2018-01-25 DIAGNOSIS — I509 Heart failure, unspecified: Secondary | ICD-10-CM | POA: Diagnosis not present

## 2018-02-01 ENCOUNTER — Ambulatory Visit
Admission: RE | Admit: 2018-02-01 | Discharge: 2018-02-01 | Disposition: A | Discharge status: Home / Self Care | Payer: Medicare Other | Source: Ambulatory Visit | Attending: Internal Medicine | Admitting: Internal Medicine

## 2018-02-01 DIAGNOSIS — M069 Rheumatoid arthritis, unspecified: Secondary | ICD-10-CM | POA: Diagnosis not present

## 2018-02-01 DIAGNOSIS — Z9221 Personal history of antineoplastic chemotherapy: Secondary | ICD-10-CM | POA: Diagnosis not present

## 2018-02-01 DIAGNOSIS — I509 Heart failure, unspecified: Secondary | ICD-10-CM | POA: Diagnosis not present

## 2018-02-01 DIAGNOSIS — E1151 Type 2 diabetes mellitus with diabetic peripheral angiopathy without gangrene: Secondary | ICD-10-CM | POA: Diagnosis not present

## 2018-02-01 DIAGNOSIS — I87311 Chronic venous hypertension (idiopathic) with ulcer of right lower extremity: Secondary | ICD-10-CM | POA: Diagnosis not present

## 2018-02-01 DIAGNOSIS — L97211 Non-pressure chronic ulcer of right calf limited to breakdown of skin: Secondary | ICD-10-CM

## 2018-02-01 DIAGNOSIS — L408 Other psoriasis: Secondary | ICD-10-CM | POA: Diagnosis not present

## 2018-02-01 DIAGNOSIS — L97812 Non-pressure chronic ulcer of other part of right lower leg with fat layer exposed: Secondary | ICD-10-CM | POA: Diagnosis not present

## 2018-02-01 DIAGNOSIS — L98499 Non-pressure chronic ulcer of skin of other sites with unspecified severity: Secondary | ICD-10-CM

## 2018-02-01 DIAGNOSIS — Z8711 Personal history of peptic ulcer disease: Secondary | ICD-10-CM | POA: Diagnosis not present

## 2018-02-01 DIAGNOSIS — L97212 Non-pressure chronic ulcer of right calf with fat layer exposed: Secondary | ICD-10-CM | POA: Diagnosis not present

## 2018-02-01 DIAGNOSIS — E11622 Type 2 diabetes mellitus with other skin ulcer: Secondary | ICD-10-CM | POA: Diagnosis not present

## 2018-02-01 DIAGNOSIS — I110 Hypertensive heart disease with heart failure: Secondary | ICD-10-CM | POA: Diagnosis not present

## 2018-02-01 DIAGNOSIS — Z85038 Personal history of other malignant neoplasm of large intestine: Secondary | ICD-10-CM | POA: Diagnosis not present

## 2018-02-08 DIAGNOSIS — I110 Hypertensive heart disease with heart failure: Secondary | ICD-10-CM | POA: Diagnosis not present

## 2018-02-08 DIAGNOSIS — I87311 Chronic venous hypertension (idiopathic) with ulcer of right lower extremity: Secondary | ICD-10-CM | POA: Diagnosis not present

## 2018-02-08 DIAGNOSIS — I509 Heart failure, unspecified: Secondary | ICD-10-CM | POA: Diagnosis not present

## 2018-02-08 DIAGNOSIS — L97812 Non-pressure chronic ulcer of other part of right lower leg with fat layer exposed: Secondary | ICD-10-CM | POA: Diagnosis not present

## 2018-02-08 DIAGNOSIS — Z9221 Personal history of antineoplastic chemotherapy: Secondary | ICD-10-CM | POA: Diagnosis not present

## 2018-02-08 DIAGNOSIS — E1151 Type 2 diabetes mellitus with diabetic peripheral angiopathy without gangrene: Secondary | ICD-10-CM | POA: Diagnosis not present

## 2018-02-08 DIAGNOSIS — M069 Rheumatoid arthritis, unspecified: Secondary | ICD-10-CM | POA: Diagnosis not present

## 2018-02-08 DIAGNOSIS — L408 Other psoriasis: Secondary | ICD-10-CM | POA: Diagnosis not present

## 2018-02-08 DIAGNOSIS — E11622 Type 2 diabetes mellitus with other skin ulcer: Secondary | ICD-10-CM | POA: Diagnosis not present

## 2018-02-08 DIAGNOSIS — L97212 Non-pressure chronic ulcer of right calf with fat layer exposed: Secondary | ICD-10-CM | POA: Diagnosis not present

## 2018-02-08 DIAGNOSIS — Z85038 Personal history of other malignant neoplasm of large intestine: Secondary | ICD-10-CM | POA: Diagnosis not present

## 2018-02-17 ENCOUNTER — Encounter (HOSPITAL_BASED_OUTPATIENT_CLINIC_OR_DEPARTMENT_OTHER): Payer: Medicare Other | Attending: Physician Assistant

## 2018-02-17 DIAGNOSIS — L97812 Non-pressure chronic ulcer of other part of right lower leg with fat layer exposed: Secondary | ICD-10-CM | POA: Insufficient documentation

## 2018-02-17 DIAGNOSIS — E11622 Type 2 diabetes mellitus with other skin ulcer: Secondary | ICD-10-CM | POA: Diagnosis not present

## 2018-02-17 DIAGNOSIS — L97212 Non-pressure chronic ulcer of right calf with fat layer exposed: Secondary | ICD-10-CM | POA: Insufficient documentation

## 2018-02-17 DIAGNOSIS — S81801A Unspecified open wound, right lower leg, initial encounter: Secondary | ICD-10-CM | POA: Diagnosis not present

## 2018-02-17 DIAGNOSIS — E1151 Type 2 diabetes mellitus with diabetic peripheral angiopathy without gangrene: Secondary | ICD-10-CM | POA: Insufficient documentation

## 2018-02-17 DIAGNOSIS — I87311 Chronic venous hypertension (idiopathic) with ulcer of right lower extremity: Secondary | ICD-10-CM | POA: Diagnosis not present

## 2018-02-22 DIAGNOSIS — E11622 Type 2 diabetes mellitus with other skin ulcer: Secondary | ICD-10-CM | POA: Diagnosis not present

## 2018-02-22 DIAGNOSIS — L97819 Non-pressure chronic ulcer of other part of right lower leg with unspecified severity: Secondary | ICD-10-CM | POA: Diagnosis not present

## 2018-02-22 DIAGNOSIS — I87311 Chronic venous hypertension (idiopathic) with ulcer of right lower extremity: Secondary | ICD-10-CM | POA: Diagnosis not present

## 2018-02-22 DIAGNOSIS — E1151 Type 2 diabetes mellitus with diabetic peripheral angiopathy without gangrene: Secondary | ICD-10-CM | POA: Diagnosis not present

## 2018-02-22 DIAGNOSIS — L97212 Non-pressure chronic ulcer of right calf with fat layer exposed: Secondary | ICD-10-CM | POA: Diagnosis not present

## 2018-02-22 DIAGNOSIS — L97812 Non-pressure chronic ulcer of other part of right lower leg with fat layer exposed: Secondary | ICD-10-CM | POA: Diagnosis not present

## 2018-03-03 DIAGNOSIS — E11622 Type 2 diabetes mellitus with other skin ulcer: Secondary | ICD-10-CM | POA: Diagnosis not present

## 2018-03-03 DIAGNOSIS — E1151 Type 2 diabetes mellitus with diabetic peripheral angiopathy without gangrene: Secondary | ICD-10-CM | POA: Diagnosis not present

## 2018-03-03 DIAGNOSIS — L97812 Non-pressure chronic ulcer of other part of right lower leg with fat layer exposed: Secondary | ICD-10-CM | POA: Diagnosis not present

## 2018-03-03 DIAGNOSIS — I87311 Chronic venous hypertension (idiopathic) with ulcer of right lower extremity: Secondary | ICD-10-CM | POA: Diagnosis not present

## 2018-03-03 DIAGNOSIS — L97212 Non-pressure chronic ulcer of right calf with fat layer exposed: Secondary | ICD-10-CM | POA: Diagnosis not present

## 2018-03-10 DIAGNOSIS — I87311 Chronic venous hypertension (idiopathic) with ulcer of right lower extremity: Secondary | ICD-10-CM | POA: Diagnosis not present

## 2018-03-10 DIAGNOSIS — E11622 Type 2 diabetes mellitus with other skin ulcer: Secondary | ICD-10-CM | POA: Diagnosis not present

## 2018-03-10 DIAGNOSIS — L97812 Non-pressure chronic ulcer of other part of right lower leg with fat layer exposed: Secondary | ICD-10-CM | POA: Diagnosis not present

## 2018-03-10 DIAGNOSIS — E1151 Type 2 diabetes mellitus with diabetic peripheral angiopathy without gangrene: Secondary | ICD-10-CM | POA: Diagnosis not present

## 2018-03-10 DIAGNOSIS — L97212 Non-pressure chronic ulcer of right calf with fat layer exposed: Secondary | ICD-10-CM | POA: Diagnosis not present

## 2018-03-17 ENCOUNTER — Encounter (HOSPITAL_BASED_OUTPATIENT_CLINIC_OR_DEPARTMENT_OTHER): Payer: Medicare Other | Attending: Physician Assistant

## 2018-03-17 DIAGNOSIS — L97212 Non-pressure chronic ulcer of right calf with fat layer exposed: Secondary | ICD-10-CM | POA: Insufficient documentation

## 2018-03-17 DIAGNOSIS — Z794 Long term (current) use of insulin: Secondary | ICD-10-CM | POA: Diagnosis not present

## 2018-03-17 DIAGNOSIS — I87311 Chronic venous hypertension (idiopathic) with ulcer of right lower extremity: Secondary | ICD-10-CM | POA: Diagnosis not present

## 2018-03-17 DIAGNOSIS — L97812 Non-pressure chronic ulcer of other part of right lower leg with fat layer exposed: Secondary | ICD-10-CM | POA: Diagnosis not present

## 2018-03-17 DIAGNOSIS — I509 Heart failure, unspecified: Secondary | ICD-10-CM | POA: Insufficient documentation

## 2018-03-17 DIAGNOSIS — I110 Hypertensive heart disease with heart failure: Secondary | ICD-10-CM | POA: Insufficient documentation

## 2018-03-17 DIAGNOSIS — Z9221 Personal history of antineoplastic chemotherapy: Secondary | ICD-10-CM | POA: Diagnosis not present

## 2018-03-17 DIAGNOSIS — E11622 Type 2 diabetes mellitus with other skin ulcer: Secondary | ICD-10-CM | POA: Insufficient documentation

## 2018-03-26 DIAGNOSIS — S81801A Unspecified open wound, right lower leg, initial encounter: Secondary | ICD-10-CM | POA: Diagnosis not present

## 2018-03-26 DIAGNOSIS — L97212 Non-pressure chronic ulcer of right calf with fat layer exposed: Secondary | ICD-10-CM | POA: Diagnosis not present

## 2018-03-26 DIAGNOSIS — L97812 Non-pressure chronic ulcer of other part of right lower leg with fat layer exposed: Secondary | ICD-10-CM | POA: Diagnosis not present

## 2018-03-26 DIAGNOSIS — Z794 Long term (current) use of insulin: Secondary | ICD-10-CM | POA: Diagnosis not present

## 2018-03-26 DIAGNOSIS — Z9221 Personal history of antineoplastic chemotherapy: Secondary | ICD-10-CM | POA: Diagnosis not present

## 2018-03-26 DIAGNOSIS — I87311 Chronic venous hypertension (idiopathic) with ulcer of right lower extremity: Secondary | ICD-10-CM | POA: Diagnosis not present

## 2018-03-26 DIAGNOSIS — I509 Heart failure, unspecified: Secondary | ICD-10-CM | POA: Diagnosis not present

## 2018-03-26 DIAGNOSIS — E11622 Type 2 diabetes mellitus with other skin ulcer: Secondary | ICD-10-CM | POA: Diagnosis not present

## 2018-03-26 DIAGNOSIS — I110 Hypertensive heart disease with heart failure: Secondary | ICD-10-CM | POA: Diagnosis not present

## 2018-03-26 DIAGNOSIS — I872 Venous insufficiency (chronic) (peripheral): Secondary | ICD-10-CM | POA: Diagnosis not present

## 2018-04-02 DIAGNOSIS — I509 Heart failure, unspecified: Secondary | ICD-10-CM | POA: Diagnosis not present

## 2018-04-02 DIAGNOSIS — L97212 Non-pressure chronic ulcer of right calf with fat layer exposed: Secondary | ICD-10-CM | POA: Diagnosis not present

## 2018-04-02 DIAGNOSIS — I110 Hypertensive heart disease with heart failure: Secondary | ICD-10-CM | POA: Diagnosis not present

## 2018-04-02 DIAGNOSIS — Z794 Long term (current) use of insulin: Secondary | ICD-10-CM | POA: Diagnosis not present

## 2018-04-02 DIAGNOSIS — L97812 Non-pressure chronic ulcer of other part of right lower leg with fat layer exposed: Secondary | ICD-10-CM | POA: Diagnosis not present

## 2018-04-02 DIAGNOSIS — S81801A Unspecified open wound, right lower leg, initial encounter: Secondary | ICD-10-CM | POA: Diagnosis not present

## 2018-04-02 DIAGNOSIS — E11622 Type 2 diabetes mellitus with other skin ulcer: Secondary | ICD-10-CM | POA: Diagnosis not present

## 2018-04-02 DIAGNOSIS — I872 Venous insufficiency (chronic) (peripheral): Secondary | ICD-10-CM | POA: Diagnosis not present

## 2018-04-02 DIAGNOSIS — I87311 Chronic venous hypertension (idiopathic) with ulcer of right lower extremity: Secondary | ICD-10-CM | POA: Diagnosis not present

## 2018-04-02 DIAGNOSIS — Z9221 Personal history of antineoplastic chemotherapy: Secondary | ICD-10-CM | POA: Diagnosis not present

## 2018-04-12 DIAGNOSIS — Z794 Long term (current) use of insulin: Secondary | ICD-10-CM | POA: Diagnosis not present

## 2018-04-12 DIAGNOSIS — L97212 Non-pressure chronic ulcer of right calf with fat layer exposed: Secondary | ICD-10-CM | POA: Diagnosis not present

## 2018-04-12 DIAGNOSIS — S81801A Unspecified open wound, right lower leg, initial encounter: Secondary | ICD-10-CM | POA: Diagnosis not present

## 2018-04-12 DIAGNOSIS — I87311 Chronic venous hypertension (idiopathic) with ulcer of right lower extremity: Secondary | ICD-10-CM | POA: Diagnosis not present

## 2018-04-12 DIAGNOSIS — Z9221 Personal history of antineoplastic chemotherapy: Secondary | ICD-10-CM | POA: Diagnosis not present

## 2018-04-12 DIAGNOSIS — I509 Heart failure, unspecified: Secondary | ICD-10-CM | POA: Diagnosis not present

## 2018-04-12 DIAGNOSIS — R600 Localized edema: Secondary | ICD-10-CM | POA: Diagnosis not present

## 2018-04-12 DIAGNOSIS — I110 Hypertensive heart disease with heart failure: Secondary | ICD-10-CM | POA: Diagnosis not present

## 2018-04-12 DIAGNOSIS — L97812 Non-pressure chronic ulcer of other part of right lower leg with fat layer exposed: Secondary | ICD-10-CM | POA: Diagnosis not present

## 2018-04-12 DIAGNOSIS — E11622 Type 2 diabetes mellitus with other skin ulcer: Secondary | ICD-10-CM | POA: Diagnosis not present

## 2018-04-19 ENCOUNTER — Encounter (HOSPITAL_BASED_OUTPATIENT_CLINIC_OR_DEPARTMENT_OTHER): Payer: Medicare Other | Attending: Internal Medicine

## 2018-04-19 DIAGNOSIS — L97812 Non-pressure chronic ulcer of other part of right lower leg with fat layer exposed: Secondary | ICD-10-CM | POA: Insufficient documentation

## 2018-04-19 DIAGNOSIS — L97212 Non-pressure chronic ulcer of right calf with fat layer exposed: Secondary | ICD-10-CM | POA: Diagnosis not present

## 2018-04-19 DIAGNOSIS — I509 Heart failure, unspecified: Secondary | ICD-10-CM | POA: Diagnosis not present

## 2018-04-19 DIAGNOSIS — E1151 Type 2 diabetes mellitus with diabetic peripheral angiopathy without gangrene: Secondary | ICD-10-CM | POA: Diagnosis not present

## 2018-04-19 DIAGNOSIS — Z23 Encounter for immunization: Secondary | ICD-10-CM | POA: Diagnosis not present

## 2018-04-19 DIAGNOSIS — M069 Rheumatoid arthritis, unspecified: Secondary | ICD-10-CM | POA: Diagnosis not present

## 2018-04-19 DIAGNOSIS — Z9221 Personal history of antineoplastic chemotherapy: Secondary | ICD-10-CM | POA: Insufficient documentation

## 2018-04-19 DIAGNOSIS — E11622 Type 2 diabetes mellitus with other skin ulcer: Secondary | ICD-10-CM | POA: Diagnosis not present

## 2018-04-19 DIAGNOSIS — I10 Essential (primary) hypertension: Secondary | ICD-10-CM | POA: Diagnosis not present

## 2018-04-19 DIAGNOSIS — I110 Hypertensive heart disease with heart failure: Secondary | ICD-10-CM | POA: Insufficient documentation

## 2018-04-21 DIAGNOSIS — I10 Essential (primary) hypertension: Secondary | ICD-10-CM | POA: Diagnosis not present

## 2018-04-21 DIAGNOSIS — L259 Unspecified contact dermatitis, unspecified cause: Secondary | ICD-10-CM | POA: Diagnosis not present

## 2018-04-23 ENCOUNTER — Emergency Department (HOSPITAL_COMMUNITY): Payer: Medicare Other

## 2018-04-23 ENCOUNTER — Emergency Department (HOSPITAL_COMMUNITY)
Admission: EM | Admit: 2018-04-23 | Discharge: 2018-04-23 | Disposition: A | Discharge status: Home / Self Care | Payer: Medicare Other | Attending: Emergency Medicine | Admitting: Emergency Medicine

## 2018-04-23 ENCOUNTER — Encounter (HOSPITAL_COMMUNITY): Payer: Self-pay | Admitting: Emergency Medicine

## 2018-04-23 ENCOUNTER — Other Ambulatory Visit: Payer: Self-pay

## 2018-04-23 DIAGNOSIS — Z794 Long term (current) use of insulin: Secondary | ICD-10-CM | POA: Insufficient documentation

## 2018-04-23 DIAGNOSIS — F419 Anxiety disorder, unspecified: Secondary | ICD-10-CM | POA: Diagnosis not present

## 2018-04-23 DIAGNOSIS — E119 Type 2 diabetes mellitus without complications: Secondary | ICD-10-CM | POA: Insufficient documentation

## 2018-04-23 DIAGNOSIS — Z85038 Personal history of other malignant neoplasm of large intestine: Secondary | ICD-10-CM | POA: Insufficient documentation

## 2018-04-23 DIAGNOSIS — Z79899 Other long term (current) drug therapy: Secondary | ICD-10-CM | POA: Insufficient documentation

## 2018-04-23 DIAGNOSIS — K5730 Diverticulosis of large intestine without perforation or abscess without bleeding: Secondary | ICD-10-CM | POA: Diagnosis not present

## 2018-04-23 DIAGNOSIS — I110 Hypertensive heart disease with heart failure: Secondary | ICD-10-CM | POA: Diagnosis not present

## 2018-04-23 DIAGNOSIS — I5030 Unspecified diastolic (congestive) heart failure: Secondary | ICD-10-CM | POA: Insufficient documentation

## 2018-04-23 DIAGNOSIS — R1032 Left lower quadrant pain: Secondary | ICD-10-CM | POA: Insufficient documentation

## 2018-04-23 LAB — CBC
HCT: 45.4 % (ref 36.0–46.0)
Hemoglobin: 14.7 g/dL (ref 12.0–15.0)
MCH: 28.8 pg (ref 26.0–34.0)
MCHC: 32.4 g/dL (ref 30.0–36.0)
MCV: 88.8 fL (ref 80.0–100.0)
PLATELETS: 280 10*3/uL (ref 150–400)
RBC: 5.11 MIL/uL (ref 3.87–5.11)
RDW: 12.9 % (ref 11.5–15.5)
WBC: 14 10*3/uL — ABNORMAL HIGH (ref 4.0–10.5)
nRBC: 0 % (ref 0.0–0.2)

## 2018-04-23 LAB — COMPREHENSIVE METABOLIC PANEL
ALBUMIN: 3.8 g/dL (ref 3.5–5.0)
ALT: 24 U/L (ref 0–44)
AST: 25 U/L (ref 15–41)
Alkaline Phosphatase: 64 U/L (ref 38–126)
Anion gap: 14 (ref 5–15)
BUN: 28 mg/dL — AB (ref 8–23)
CO2: 23 mmol/L (ref 22–32)
CREATININE: 1.06 mg/dL — AB (ref 0.44–1.00)
Calcium: 9.2 mg/dL (ref 8.9–10.3)
Chloride: 98 mmol/L (ref 98–111)
GFR calc Af Amer: 54 mL/min — ABNORMAL LOW (ref >60)
GFR calc non Af Amer: 47 mL/min — ABNORMAL LOW (ref >60)
GLUCOSE: 272 mg/dL — AB (ref 70–99)
Potassium: 4.2 mmol/L (ref 3.5–5.1)
SODIUM: 135 mmol/L (ref 135–145)
TOTAL PROTEIN: 8.5 g/dL — AB (ref 6.5–8.1)

## 2018-04-23 LAB — LIPASE, BLOOD: Lipase: 28 U/L (ref 11–51)

## 2018-04-23 MED ORDER — SODIUM CHLORIDE 0.9 % IJ SOLN
INTRAMUSCULAR | Status: AC
  Filled 2018-04-23: qty 50

## 2018-04-23 MED ORDER — ONDANSETRON HCL 4 MG/2ML IJ SOLN
4.0000 mg | Freq: Once | INTRAMUSCULAR | Status: AC
  Administered 2018-04-23: 4 mg via INTRAVENOUS
  Filled 2018-04-23: qty 2

## 2018-04-23 MED ORDER — ONDANSETRON 4 MG PO TBDP: 4.0000 mg | ORAL_TABLET | Freq: Three times a day (TID) | ORAL | 0 refills | Status: DC | PRN

## 2018-04-23 MED ORDER — SODIUM CHLORIDE 0.9 % IV BOLUS
500.0000 mL | Freq: Once | INTRAVENOUS | Status: AC
  Administered 2018-04-23: 500 mL via INTRAVENOUS

## 2018-04-23 MED ORDER — IOPAMIDOL (ISOVUE-300) INJECTION 61%
INTRAVENOUS | Status: AC
  Filled 2018-04-23: qty 100

## 2018-04-23 MED ORDER — IOPAMIDOL (ISOVUE-300) INJECTION 61%
100.0000 mL | Freq: Once | INTRAVENOUS | Status: AC | PRN
  Administered 2018-04-23: 80 mL via INTRAVENOUS

## 2018-04-23 MED ORDER — MORPHINE SULFATE (PF) 4 MG/ML IV SOLN
4.0000 mg | Freq: Once | INTRAVENOUS | Status: AC
  Administered 2018-04-23: 4 mg via INTRAVENOUS
  Filled 2018-04-23: qty 1

## 2018-04-23 NOTE — Discharge Instructions (Signed)
Take the prescribed medication as directed.  Gentle diet for now, progress as tolerated.  Continue lots of good oral hydration. Follow-up with your primary care doctor. Return to the ED for new or worsening symptoms.

## 2018-04-23 NOTE — ED Provider Notes (Signed)
[facility] DEPT Provider Note   CSN: [id number] Arrival date & time: 04/23/18  0132     History   Chief Complaint Chief Complaint  Patient presents with  . Abdominal Pain  . Emesis    HPI [name] is a 84 y.o. female.  The history is provided by the patient and medical records.  Abdominal Pain   Associated symptoms include vomiting.  Emesis   Associated symptoms include abdominal pain.     84 year old female with history of anxiety, arthritis, colon cancer, CHF with EF of 65 to 70%, diabetes, hypertension, presenting to the ED with left lower abdominal pain.  This began last evening around 5 PM and has been steadily worsening.  Pain is dull and aching in left lower abdomen without radiation.  She reports associated nausea and vomiting, multiple episodes.  Has been nonbloody nonbilious.  She had 2 bowel movements yesterday that were normal.  No changes in urination.  Denies any frequency or dysuria.  Has had prior appendectomy, hysterectomy, and prior colectomy.  Daughter reports episodes of bowel obstruction and diverticulitis in the past.  Past Medical History:  Diagnosis Date  . Anxiety   . Arthritis   . Cancer [facility]) 2006   colon  . Cataract   . CHF (congestive heart failure) ([place]) 05/2016   preserved EF, grade 2 diastolic dysfunction  . Diabetes mellitus without complication ([place])   . Diverticulitis   . Erythroderma desquamativum   . Hypertension     Patient Active Problem List   Diagnosis Date Noted  . Acute on chronic diastolic CHF (congestive heart failure) ([place]) 05/06/2017  . Altered mental status   . Acute respiratory failure ([place]) 05/04/2017  . Hypoglycemia 04/30/2017  . Acute urinary retention 02/23/2017  . Chronic diastolic CHF (congestive heart failure) ([place])   . SBO (small bowel obstruction) ([place]) 02/22/2017  . Hypertensive urgency 02/22/2017  . Leukocytosis 02/22/2017  . Hyponatremia 02/22/2017  . Dehydration  02/22/2017  . Dizziness 09/01/2016  . Weakness 09/01/2016  . Psoriasis vulgaris 09/01/2016  . Right hip pain 09/01/2016  . Diabetic foot ulcer ([place]) 09/01/2016  . Dyspnea 05/15/2016  . Congestive heart failure (CHF) ([place]) 05/15/2016  . Rash and nonspecific skin eruption 05/15/2016  . Essential hypertension 05/15/2016  . Diabetes mellitus type 2 in obese ([place]) 05/15/2016  . Anxiety 05/15/2016  . Acute respiratory failure with hypoxia ([place]) 05/15/2016    Past Surgical History:  Procedure Laterality Date  . ABDOMINAL HYSTERECTOMY    . APPENDECTOMY    . COLON SURGERY       OB History   None      Home Medications    Prior to Admission medications   Medication Sig Start Date End Date Taking? Authorizing Provider  ALPRAZolam Duanne Moron) 0.5 MG tablet Take 1 tablet (0.5 mg total) by mouth 3 (three) times daily. 05/08/17   Orson Eva, MD  amLODipine (NORVASC) 5 MG tablet Take 2 tablets (10 mg total) by mouth daily. Patient taking differently: Take 5 mg by mouth daily.  02/28/17   Dessa Phi, DO  BIOTIN PO Take 1 tablet by mouth daily.    [provider]  carvedilol (COREG) 6.25 MG tablet Take 1 tablet (6.25 mg total) by mouth 2 (two) times daily with a meal. 05/08/17   Tat, Shanon Brow, MD  cetirizine (ZYRTEC) 10 MG tablet Take 10 mg by mouth daily.    [provider]  diclofenac sodium (VOLTAREN) 1 % GEL Apply 4 g  topically 4 (four) times daily. To knees 05/08/17   Tat, Shanon Brow, MD  docusate sodium (COLACE) 100 MG capsule Take 1 capsule (100 mg total) by mouth 2 (two) times daily. 02/28/17   Dessa Phi, DO  furosemide (LASIX) 20 MG tablet Take 1 tablet (20 mg total) by mouth daily before supper. 05/08/17   Orson Eva, MD  furosemide (LASIX) 40 MG tablet Take 1 tablet (40 mg total) by mouth every morning. 05/08/17   Orson Eva, MD  glipiZIDE (GLUCOTROL XL) 10 MG 24 hr tablet Take 1 tablet by mouth daily. 04/20/17   [provider]  HUMALOG KWIKPEN 200 UNIT/ML  SOPN Inject 0-15 Units into the skin 3 (three) times daily with meals. CBG 70-120--no units; 121-150--2 units; 151-200--3 units; 201-250--5 units; 251-300--8 units; 301-350--11 units; 351-400--15 units 05/08/17   Tat, David, MD  insulin glargine (LANTUS) 100 UNIT/ML injection Inject 0.2 mLs (20 Units total) into the skin at bedtime. 05/08/17   Orson Eva, MD  Multiple Vitamin (MULTIVITAMIN) tablet Take 1 tablet by mouth daily.    [provider]  multivitamin-lutein (OCUVITE-LUTEIN) CAPS capsule Take 1 capsule by mouth daily.    [provider]  potassium chloride SA (K-DUR,KLOR-CON) 10 MEQ tablet Take 1 tablet (10 mEq total) by mouth daily. 05/09/17   Orson Eva, MD  prednisoLONE acetate (PRED FORTE) 1 % ophthalmic suspension Place 1 drop into both eyes 4 (four) times daily. 02/09/17   [provider]  triamcinolone cream (KENALOG) 0.1 % APPLY TWICE DAILY 01/13/12   Rise Mu, PA-C    Family History Family History  Problem Relation Age of Onset  . Stroke Mother   . Diabetes Father   . Cancer Sister   . Diabetes Sister   . Cancer Brother   . Cancer Brother   . Diabetes Sister   . Stroke Sister     Social History Social History   Tobacco Use  . Smoking status: Never Smoker  . Smokeless tobacco: Never Used  Substance Use Topics  . Alcohol use: No  . Drug use: No     Allergies   Ace inhibitors; Glipizide; Neosporin [neomycin-bacitracin zn-polymyx]; Spironolactone; and Diovan [valsartan]   Review of Systems Review of Systems  Gastrointestinal: Positive for abdominal pain and vomiting.  All other systems reviewed and are negative.    Physical Exam Updated Vital Signs BP (!) 180/79 (BP Location: Right Arm)   Pulse 60   Temp 98.3 F (36.8 C) (Oral)   Resp 16   SpO2 96%   Physical Exam  Constitutional: She is oriented to person, place, and time. She appears well-developed and well-nourished.  HENT:  Head: Normocephalic and atraumatic.    Mouth/Throat: Oropharynx is clear and moist.  Dry mucous membranes  Eyes: Pupils are equal, round, and reactive to light. Conjunctivae and EOM are normal.  Neck: Normal range of motion.  Cardiovascular: Normal rate, regular rhythm and normal heart sounds.  Pulmonary/Chest: Effort normal and breath sounds normal.  Abdominal: Soft. Bowel sounds are normal. There is tenderness in the left lower quadrant. There is no rigidity and no guarding.  Musculoskeletal: Normal range of motion.  Neurological: She is alert and oriented to person, place, and time.  Skin: Skin is warm and dry.  Psychiatric: She has a normal mood and affect.  Nursing note and vitals reviewed.    ED Treatments / Results  Labs (all labs ordered are listed, but only abnormal results are displayed) Labs Reviewed  COMPREHENSIVE METABOLIC PANEL - Abnormal;  Notable for the following components:      Result Value   Glucose, Bld 272 (*)    BUN 28 (*)    Creatinine, Ser 1.06 (*)    Total Protein 8.5 (*)    Total Bilirubin <0.1 (*)    GFR calc non Af Amer 47 (*)    GFR calc Af Amer 54 (*)    All other components within normal limits  CBC - Abnormal; Notable for the following components:   WBC 14.0 (*)    All other components within normal limits  LIPASE, BLOOD  URINALYSIS, ROUTINE W REFLEX MICROSCOPIC    EKG None  Radiology Ct Abdomen Pelvis W Contrast  Result Date: 04/23/2018 CLINICAL DATA:  Left lower quadrant pain, nausea and vomiting. History of diverticulitis, hypertension, diabetes. EXAM: CT ABDOMEN AND PELVIS WITH CONTRAST TECHNIQUE: Multidetector CT imaging of the abdomen and pelvis was performed using the standard protocol following bolus administration of intravenous contrast. CONTRAST:  63mL ISOVUE-300 IOPAMIDOL (ISOVUE-300) INJECTION 61% COMPARISON:  02/22/2017 FINDINGS: Lower chest: Atelectasis or infiltration in the lung bases. Cardiac enlargement. Hepatobiliary: Diffuse fatty infiltration of the liver.  No focal liver abnormality is seen. No gallstones, gallbladder wall thickening, or biliary dilatation. Pancreas: Unremarkable. No pancreatic ductal dilatation or surrounding inflammatory changes. Spleen: Normal in size without focal abnormality. Adrenals/Urinary Tract: Adrenal glands are unremarkable. Kidneys are normal, without renal calculi, focal lesion, or hydronephrosis. Bladder is unremarkable. Stomach/Bowel: Stomach and small bowel are mostly decompressed. Colon is not abnormally distended. Fluid and stool throughout the colon. Appendix is not identified. Diverticula in the sigmoid colon without evidence of diverticulitis. Surgical clips at the rectosigmoid junction. Vascular/Lymphatic: Aortic atherosclerosis. No enlarged abdominal or pelvic lymph nodes. Reproductive: Status post hysterectomy. No adnexal masses. Other: Minimal periumbilical hernia containing fat. Mild soft tissue edema. No free air or free fluid in the abdomen. Musculoskeletal: Degenerative changes in the spine and hips. No destructive bone lesions. IMPRESSION: 1. Atelectasis or infiltration in the lung bases. 2. Diffuse fatty infiltration of the liver. 3. Diverticulosis of the sigmoid colon without evidence of diverticulitis. 4. No evidence of bowel obstruction or inflammation. Fluid-filled colon may indicate infectious process. Aortic Atherosclerosis (ICD10-I70.0). Electronically Signed   By: Lucienne Capers M.D.   On: 04/23/2018 06:47    Procedures Procedures (including critical care time)  Medications Ordered in ED Medications  iopamidol (ISOVUE-300) 61 % injection (has no administration in time range)  sodium chloride 0.9 % injection (has no administration in time range)  morphine 4 MG/ML injection 4 mg (4 mg Intravenous Given 04/23/18 0516)  ondansetron (ZOFRAN) injection 4 mg (4 mg Intravenous Given 04/23/18 0516)  sodium chloride 0.9 % bolus 500 mL (0 mLs Intravenous Stopped 04/23/18 0606)  iopamidol (ISOVUE-300) 61 %  injection 100 mL (80 mLs Intravenous Contrast Given 04/23/18 0622)     Initial Impression / Assessment and Plan / ED Course  I have reviewed the triage vital signs and the nursing notes.  Pertinent labs & imaging results that were available during my care of the patient were reviewed by me and considered in my medical decision making (see chart for details).  84 year old female presenting to the ED with left lower abdominal pain.  Onset 5 PM yesterday, gradually worsening.  Daughter reports history of diverticulitis and small bowel obstruction.  She is afebrile and nontoxic.    She does have tenderness in the left lower abdomen without peritoneal signs.  Labs obtained, does have leukocytosis of 14,000.  Will obtain CT scan  for further evaluation.  CT scan without findings of diverticulitis or colitis, does have stool and some fluid noted in the colon.  There is no colonic edema suggestive of colitis.  Patient has not recently been on any type of antibiotic.  Patient has been sleeping here she has not had any further pain, nausea, or vomiting.  Her vitals are stable.  Repeat abdominal exam is benign.  Possible viral etiology.  She does not have any fever or tachycardia.  Do not feel she requires prophylactic antibiotics at this time.  Discussed supportive care measures including gentle diet, oral hydration, and PRN Zofran.  Close follow-up with pediatrician.  Patient and daughter at bedside comfortable with plan of care.  Final Clinical Impressions(s) / ED Diagnoses   Final diagnoses:  LLQ pain    ED Discharge Orders         Ordered    ondansetron (ZOFRAN ODT) 4 MG disintegrating tablet  Every 8 hours PRN     04/23/18 0654           Larene Pickett, PA-C 04/23/18 0657    Molpus, Jenny Reichmann, MD 04/23/18 [phone]

## 2018-04-23 NOTE — ED Notes (Signed)
Pt. Made aware for the need of urine specimen. 

## 2018-04-23 NOTE — ED Triage Notes (Addendum)
Pt from home with c/o lower left abdominal pain that began around 1700. Pt denies radiation of pain. Pt has hx of diverticulosis. LBM was yesterday. Pt had 2 episodes of emesis today.

## 2018-04-26 DIAGNOSIS — E1151 Type 2 diabetes mellitus with diabetic peripheral angiopathy without gangrene: Secondary | ICD-10-CM | POA: Diagnosis not present

## 2018-04-26 DIAGNOSIS — I509 Heart failure, unspecified: Secondary | ICD-10-CM | POA: Diagnosis not present

## 2018-04-26 DIAGNOSIS — Z9221 Personal history of antineoplastic chemotherapy: Secondary | ICD-10-CM | POA: Diagnosis not present

## 2018-04-26 DIAGNOSIS — L97812 Non-pressure chronic ulcer of other part of right lower leg with fat layer exposed: Secondary | ICD-10-CM | POA: Diagnosis not present

## 2018-04-26 DIAGNOSIS — I110 Hypertensive heart disease with heart failure: Secondary | ICD-10-CM | POA: Diagnosis not present

## 2018-04-26 DIAGNOSIS — M069 Rheumatoid arthritis, unspecified: Secondary | ICD-10-CM | POA: Diagnosis not present

## 2018-04-26 DIAGNOSIS — E11622 Type 2 diabetes mellitus with other skin ulcer: Secondary | ICD-10-CM | POA: Diagnosis not present

## 2018-05-05 DIAGNOSIS — L97212 Non-pressure chronic ulcer of right calf with fat layer exposed: Secondary | ICD-10-CM | POA: Diagnosis not present

## 2018-05-05 DIAGNOSIS — I110 Hypertensive heart disease with heart failure: Secondary | ICD-10-CM | POA: Diagnosis not present

## 2018-05-05 DIAGNOSIS — E11622 Type 2 diabetes mellitus with other skin ulcer: Secondary | ICD-10-CM | POA: Diagnosis not present

## 2018-05-05 DIAGNOSIS — M069 Rheumatoid arthritis, unspecified: Secondary | ICD-10-CM | POA: Diagnosis not present

## 2018-05-05 DIAGNOSIS — I951 Orthostatic hypotension: Secondary | ICD-10-CM | POA: Diagnosis not present

## 2018-05-05 DIAGNOSIS — Z9221 Personal history of antineoplastic chemotherapy: Secondary | ICD-10-CM | POA: Diagnosis not present

## 2018-05-05 DIAGNOSIS — I509 Heart failure, unspecified: Secondary | ICD-10-CM | POA: Diagnosis not present

## 2018-05-05 DIAGNOSIS — E1151 Type 2 diabetes mellitus with diabetic peripheral angiopathy without gangrene: Secondary | ICD-10-CM | POA: Diagnosis not present

## 2018-05-05 DIAGNOSIS — L97812 Non-pressure chronic ulcer of other part of right lower leg with fat layer exposed: Secondary | ICD-10-CM | POA: Diagnosis not present

## 2018-05-10 DIAGNOSIS — M069 Rheumatoid arthritis, unspecified: Secondary | ICD-10-CM | POA: Diagnosis not present

## 2018-05-10 DIAGNOSIS — E11622 Type 2 diabetes mellitus with other skin ulcer: Secondary | ICD-10-CM | POA: Diagnosis not present

## 2018-05-10 DIAGNOSIS — Z9221 Personal history of antineoplastic chemotherapy: Secondary | ICD-10-CM | POA: Diagnosis not present

## 2018-05-10 DIAGNOSIS — L97812 Non-pressure chronic ulcer of other part of right lower leg with fat layer exposed: Secondary | ICD-10-CM | POA: Diagnosis not present

## 2018-05-10 DIAGNOSIS — I509 Heart failure, unspecified: Secondary | ICD-10-CM | POA: Diagnosis not present

## 2018-05-10 DIAGNOSIS — I110 Hypertensive heart disease with heart failure: Secondary | ICD-10-CM | POA: Diagnosis not present

## 2018-05-10 DIAGNOSIS — E1151 Type 2 diabetes mellitus with diabetic peripheral angiopathy without gangrene: Secondary | ICD-10-CM | POA: Diagnosis not present

## 2018-05-19 ENCOUNTER — Encounter (HOSPITAL_BASED_OUTPATIENT_CLINIC_OR_DEPARTMENT_OTHER): Payer: Medicare Other | Attending: Internal Medicine

## 2018-05-19 DIAGNOSIS — E11622 Type 2 diabetes mellitus with other skin ulcer: Secondary | ICD-10-CM | POA: Diagnosis not present

## 2018-05-19 DIAGNOSIS — M069 Rheumatoid arthritis, unspecified: Secondary | ICD-10-CM | POA: Insufficient documentation

## 2018-05-19 DIAGNOSIS — L97212 Non-pressure chronic ulcer of right calf with fat layer exposed: Secondary | ICD-10-CM | POA: Insufficient documentation

## 2018-05-19 DIAGNOSIS — L97812 Non-pressure chronic ulcer of other part of right lower leg with fat layer exposed: Secondary | ICD-10-CM | POA: Insufficient documentation

## 2018-05-19 DIAGNOSIS — Z9221 Personal history of antineoplastic chemotherapy: Secondary | ICD-10-CM | POA: Diagnosis not present

## 2018-05-19 DIAGNOSIS — I509 Heart failure, unspecified: Secondary | ICD-10-CM | POA: Insufficient documentation

## 2018-05-19 DIAGNOSIS — E1151 Type 2 diabetes mellitus with diabetic peripheral angiopathy without gangrene: Secondary | ICD-10-CM | POA: Insufficient documentation

## 2018-05-19 DIAGNOSIS — I110 Hypertensive heart disease with heart failure: Secondary | ICD-10-CM | POA: Diagnosis not present

## 2018-05-19 DIAGNOSIS — Z794 Long term (current) use of insulin: Secondary | ICD-10-CM | POA: Diagnosis not present

## 2018-05-24 DIAGNOSIS — E1122 Type 2 diabetes mellitus with diabetic chronic kidney disease: Secondary | ICD-10-CM | POA: Diagnosis not present

## 2018-05-24 DIAGNOSIS — E782 Mixed hyperlipidemia: Secondary | ICD-10-CM | POA: Diagnosis not present

## 2018-05-24 DIAGNOSIS — E1165 Type 2 diabetes mellitus with hyperglycemia: Secondary | ICD-10-CM | POA: Diagnosis not present

## 2018-05-24 DIAGNOSIS — I10 Essential (primary) hypertension: Secondary | ICD-10-CM | POA: Diagnosis not present

## 2018-05-24 DIAGNOSIS — I1310 Hypertensive heart and chronic kidney disease without heart failure, with stage 1 through stage 4 chronic kidney disease, or unspecified chronic kidney disease: Secondary | ICD-10-CM | POA: Diagnosis not present

## 2018-05-26 DIAGNOSIS — L97819 Non-pressure chronic ulcer of other part of right lower leg with unspecified severity: Secondary | ICD-10-CM | POA: Diagnosis not present

## 2018-05-26 DIAGNOSIS — E11622 Type 2 diabetes mellitus with other skin ulcer: Secondary | ICD-10-CM | POA: Diagnosis not present

## 2018-05-26 DIAGNOSIS — I110 Hypertensive heart disease with heart failure: Secondary | ICD-10-CM | POA: Diagnosis not present

## 2018-05-26 DIAGNOSIS — I509 Heart failure, unspecified: Secondary | ICD-10-CM | POA: Diagnosis not present

## 2018-05-26 DIAGNOSIS — E1151 Type 2 diabetes mellitus with diabetic peripheral angiopathy without gangrene: Secondary | ICD-10-CM | POA: Diagnosis not present

## 2018-05-26 DIAGNOSIS — N183 Chronic kidney disease, stage 3 (moderate): Secondary | ICD-10-CM | POA: Diagnosis not present

## 2018-05-26 DIAGNOSIS — L97212 Non-pressure chronic ulcer of right calf with fat layer exposed: Secondary | ICD-10-CM | POA: Diagnosis not present

## 2018-05-26 DIAGNOSIS — L97219 Non-pressure chronic ulcer of right calf with unspecified severity: Secondary | ICD-10-CM | POA: Diagnosis not present

## 2018-05-26 DIAGNOSIS — D649 Anemia, unspecified: Secondary | ICD-10-CM | POA: Diagnosis not present

## 2018-05-26 DIAGNOSIS — E1122 Type 2 diabetes mellitus with diabetic chronic kidney disease: Secondary | ICD-10-CM | POA: Diagnosis not present

## 2018-05-26 DIAGNOSIS — Z794 Long term (current) use of insulin: Secondary | ICD-10-CM | POA: Diagnosis not present

## 2018-05-26 DIAGNOSIS — E782 Mixed hyperlipidemia: Secondary | ICD-10-CM | POA: Diagnosis not present

## 2018-05-26 DIAGNOSIS — Z9221 Personal history of antineoplastic chemotherapy: Secondary | ICD-10-CM | POA: Diagnosis not present

## 2018-05-26 DIAGNOSIS — L97812 Non-pressure chronic ulcer of other part of right lower leg with fat layer exposed: Secondary | ICD-10-CM | POA: Diagnosis not present

## 2018-05-26 DIAGNOSIS — M069 Rheumatoid arthritis, unspecified: Secondary | ICD-10-CM | POA: Diagnosis not present

## 2018-05-26 DIAGNOSIS — I129 Hypertensive chronic kidney disease with stage 1 through stage 4 chronic kidney disease, or unspecified chronic kidney disease: Secondary | ICD-10-CM | POA: Diagnosis not present

## 2018-07-26 IMAGING — CR DG CHEST 1V PORT
1 series · 1 of 1 positions shown · non-contrast
Comparison: 02/05/2013

CLINICAL DATA: Shortness of breath for several days

EXAM:
PORTABLE CHEST 1 VIEW

[portable]
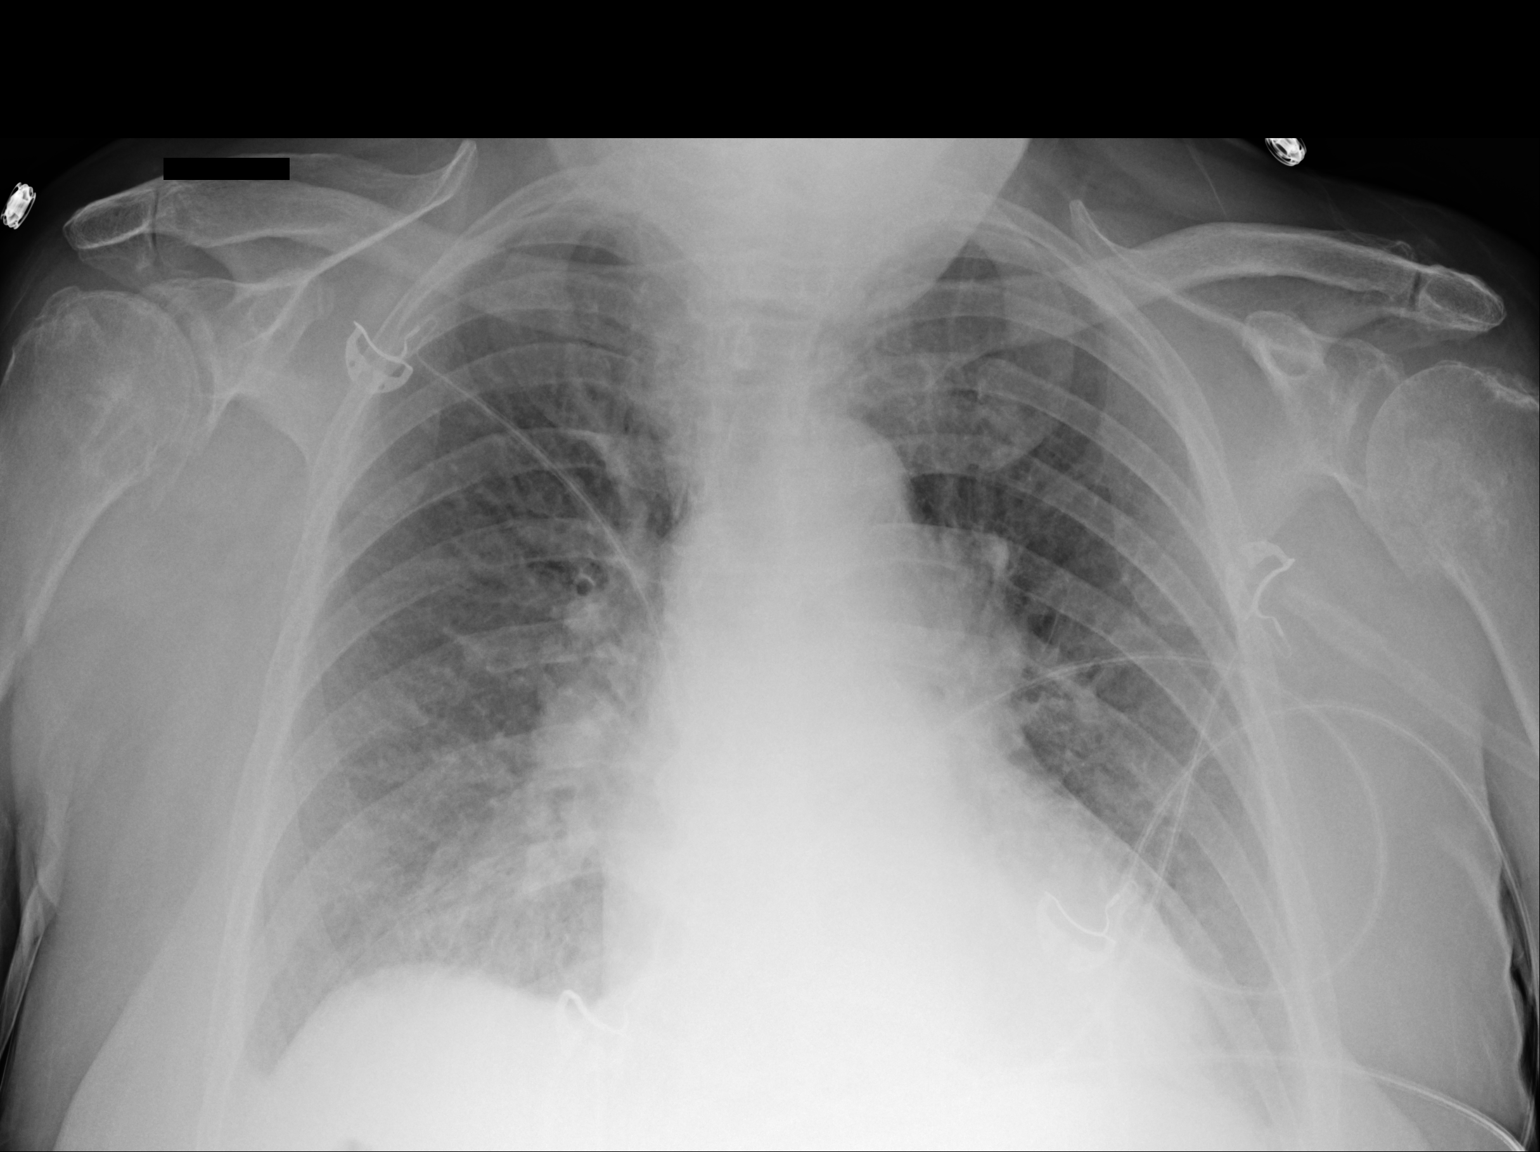

[1 of 1 positions shown; findings below may reference images not displayed]

FINDINGS: Cardiac shadow is stable. Increased vascular congestion and
interstitial edema is noted. Increased density is noted over the
bases bilaterally likely related to a small posterior effusions. No
bony abnormality is noted.
IMPRESSION: Changes consistent with CHF

## 2018-08-14 DIAGNOSIS — R350 Frequency of micturition: Secondary | ICD-10-CM | POA: Diagnosis not present

## 2018-08-14 DIAGNOSIS — L209 Atopic dermatitis, unspecified: Secondary | ICD-10-CM | POA: Diagnosis not present

## 2018-09-24 DIAGNOSIS — D509 Iron deficiency anemia, unspecified: Secondary | ICD-10-CM | POA: Diagnosis not present

## 2018-09-24 DIAGNOSIS — I1310 Hypertensive heart and chronic kidney disease without heart failure, with stage 1 through stage 4 chronic kidney disease, or unspecified chronic kidney disease: Secondary | ICD-10-CM | POA: Diagnosis not present

## 2018-09-24 DIAGNOSIS — E1122 Type 2 diabetes mellitus with diabetic chronic kidney disease: Secondary | ICD-10-CM | POA: Diagnosis not present

## 2018-09-24 DIAGNOSIS — D649 Anemia, unspecified: Secondary | ICD-10-CM | POA: Diagnosis not present

## 2018-09-24 DIAGNOSIS — E1165 Type 2 diabetes mellitus with hyperglycemia: Secondary | ICD-10-CM | POA: Diagnosis not present

## 2018-09-24 DIAGNOSIS — I10 Essential (primary) hypertension: Secondary | ICD-10-CM | POA: Diagnosis not present

## 2018-10-01 DIAGNOSIS — I10 Essential (primary) hypertension: Secondary | ICD-10-CM | POA: Diagnosis not present

## 2018-10-01 DIAGNOSIS — N183 Chronic kidney disease, stage 3 (moderate): Secondary | ICD-10-CM | POA: Diagnosis not present

## 2018-10-01 DIAGNOSIS — E782 Mixed hyperlipidemia: Secondary | ICD-10-CM | POA: Diagnosis not present

## 2018-10-01 DIAGNOSIS — E1122 Type 2 diabetes mellitus with diabetic chronic kidney disease: Secondary | ICD-10-CM | POA: Diagnosis not present

## 2018-10-01 DIAGNOSIS — E1165 Type 2 diabetes mellitus with hyperglycemia: Secondary | ICD-10-CM | POA: Diagnosis not present

## 2018-10-13 DIAGNOSIS — I1310 Hypertensive heart and chronic kidney disease without heart failure, with stage 1 through stage 4 chronic kidney disease, or unspecified chronic kidney disease: Secondary | ICD-10-CM | POA: Diagnosis not present

## 2018-10-13 DIAGNOSIS — E1122 Type 2 diabetes mellitus with diabetic chronic kidney disease: Secondary | ICD-10-CM | POA: Diagnosis not present

## 2018-10-13 DIAGNOSIS — E1165 Type 2 diabetes mellitus with hyperglycemia: Secondary | ICD-10-CM | POA: Diagnosis not present

## 2018-10-13 DIAGNOSIS — D649 Anemia, unspecified: Secondary | ICD-10-CM | POA: Diagnosis not present

## 2018-10-13 DIAGNOSIS — D509 Iron deficiency anemia, unspecified: Secondary | ICD-10-CM | POA: Diagnosis not present

## 2018-11-19 DIAGNOSIS — Z0000 Encounter for general adult medical examination without abnormal findings: Secondary | ICD-10-CM | POA: Diagnosis not present

## 2018-12-17 DIAGNOSIS — E1165 Type 2 diabetes mellitus with hyperglycemia: Secondary | ICD-10-CM | POA: Diagnosis not present

## 2018-12-17 DIAGNOSIS — D509 Iron deficiency anemia, unspecified: Secondary | ICD-10-CM | POA: Diagnosis not present

## 2018-12-17 DIAGNOSIS — E1122 Type 2 diabetes mellitus with diabetic chronic kidney disease: Secondary | ICD-10-CM | POA: Diagnosis not present

## 2018-12-17 DIAGNOSIS — I1310 Hypertensive heart and chronic kidney disease without heart failure, with stage 1 through stage 4 chronic kidney disease, or unspecified chronic kidney disease: Secondary | ICD-10-CM | POA: Diagnosis not present

## 2018-12-17 DIAGNOSIS — D649 Anemia, unspecified: Secondary | ICD-10-CM | POA: Diagnosis not present

## 2019-01-19 DIAGNOSIS — D649 Anemia, unspecified: Secondary | ICD-10-CM | POA: Diagnosis not present

## 2019-01-19 DIAGNOSIS — I1310 Hypertensive heart and chronic kidney disease without heart failure, with stage 1 through stage 4 chronic kidney disease, or unspecified chronic kidney disease: Secondary | ICD-10-CM | POA: Diagnosis not present

## 2019-01-19 DIAGNOSIS — L259 Unspecified contact dermatitis, unspecified cause: Secondary | ICD-10-CM | POA: Diagnosis not present

## 2019-01-19 DIAGNOSIS — Z23 Encounter for immunization: Secondary | ICD-10-CM | POA: Diagnosis not present

## 2019-01-31 DIAGNOSIS — D631 Anemia in chronic kidney disease: Secondary | ICD-10-CM | POA: Diagnosis not present

## 2019-01-31 DIAGNOSIS — E785 Hyperlipidemia, unspecified: Secondary | ICD-10-CM | POA: Diagnosis not present

## 2019-01-31 DIAGNOSIS — D649 Anemia, unspecified: Secondary | ICD-10-CM | POA: Diagnosis not present

## 2019-01-31 DIAGNOSIS — E1165 Type 2 diabetes mellitus with hyperglycemia: Secondary | ICD-10-CM | POA: Diagnosis not present

## 2019-01-31 DIAGNOSIS — E1142 Type 2 diabetes mellitus with diabetic polyneuropathy: Secondary | ICD-10-CM | POA: Diagnosis not present

## 2019-02-12 DIAGNOSIS — D649 Anemia, unspecified: Secondary | ICD-10-CM | POA: Diagnosis not present

## 2019-02-12 DIAGNOSIS — E1165 Type 2 diabetes mellitus with hyperglycemia: Secondary | ICD-10-CM | POA: Diagnosis not present

## 2019-02-12 DIAGNOSIS — I1310 Hypertensive heart and chronic kidney disease without heart failure, with stage 1 through stage 4 chronic kidney disease, or unspecified chronic kidney disease: Secondary | ICD-10-CM | POA: Diagnosis not present

## 2019-02-12 DIAGNOSIS — E1122 Type 2 diabetes mellitus with diabetic chronic kidney disease: Secondary | ICD-10-CM | POA: Diagnosis not present

## 2019-02-14 DIAGNOSIS — I129 Hypertensive chronic kidney disease with stage 1 through stage 4 chronic kidney disease, or unspecified chronic kidney disease: Secondary | ICD-10-CM | POA: Diagnosis not present

## 2019-02-14 DIAGNOSIS — E1122 Type 2 diabetes mellitus with diabetic chronic kidney disease: Secondary | ICD-10-CM | POA: Diagnosis not present

## 2019-02-14 DIAGNOSIS — N183 Chronic kidney disease, stage 3 (moderate): Secondary | ICD-10-CM | POA: Diagnosis not present

## 2019-02-14 DIAGNOSIS — E1165 Type 2 diabetes mellitus with hyperglycemia: Secondary | ICD-10-CM | POA: Diagnosis not present

## 2019-02-14 DIAGNOSIS — E782 Mixed hyperlipidemia: Secondary | ICD-10-CM | POA: Diagnosis not present

## 2019-03-16 DIAGNOSIS — E1122 Type 2 diabetes mellitus with diabetic chronic kidney disease: Secondary | ICD-10-CM | POA: Diagnosis not present

## 2019-03-16 DIAGNOSIS — D649 Anemia, unspecified: Secondary | ICD-10-CM | POA: Diagnosis not present

## 2019-03-16 DIAGNOSIS — I1310 Hypertensive heart and chronic kidney disease without heart failure, with stage 1 through stage 4 chronic kidney disease, or unspecified chronic kidney disease: Secondary | ICD-10-CM | POA: Diagnosis not present

## 2019-03-25 DIAGNOSIS — E113293 Type 2 diabetes mellitus with mild nonproliferative diabetic retinopathy without macular edema, bilateral: Secondary | ICD-10-CM | POA: Diagnosis not present

## 2019-04-15 DIAGNOSIS — D649 Anemia, unspecified: Secondary | ICD-10-CM | POA: Diagnosis not present

## 2019-04-15 DIAGNOSIS — D509 Iron deficiency anemia, unspecified: Secondary | ICD-10-CM | POA: Diagnosis not present

## 2019-04-15 DIAGNOSIS — E1122 Type 2 diabetes mellitus with diabetic chronic kidney disease: Secondary | ICD-10-CM | POA: Diagnosis not present

## 2019-04-15 DIAGNOSIS — E1165 Type 2 diabetes mellitus with hyperglycemia: Secondary | ICD-10-CM | POA: Diagnosis not present

## 2019-04-15 DIAGNOSIS — I1310 Hypertensive heart and chronic kidney disease without heart failure, with stage 1 through stage 4 chronic kidney disease, or unspecified chronic kidney disease: Secondary | ICD-10-CM | POA: Diagnosis not present

## 2019-06-08 DIAGNOSIS — E1122 Type 2 diabetes mellitus with diabetic chronic kidney disease: Secondary | ICD-10-CM | POA: Diagnosis not present

## 2019-06-08 DIAGNOSIS — D649 Anemia, unspecified: Secondary | ICD-10-CM | POA: Diagnosis not present

## 2019-06-08 DIAGNOSIS — I1310 Hypertensive heart and chronic kidney disease without heart failure, with stage 1 through stage 4 chronic kidney disease, or unspecified chronic kidney disease: Secondary | ICD-10-CM | POA: Diagnosis not present

## 2019-06-15 DIAGNOSIS — E1122 Type 2 diabetes mellitus with diabetic chronic kidney disease: Secondary | ICD-10-CM | POA: Diagnosis not present

## 2019-06-15 DIAGNOSIS — E1165 Type 2 diabetes mellitus with hyperglycemia: Secondary | ICD-10-CM | POA: Diagnosis not present

## 2019-06-15 DIAGNOSIS — I1310 Hypertensive heart and chronic kidney disease without heart failure, with stage 1 through stage 4 chronic kidney disease, or unspecified chronic kidney disease: Secondary | ICD-10-CM | POA: Diagnosis not present

## 2019-07-11 IMAGING — CR DG CHEST 1V PORT
1 series · 1 of 1 positions shown · non-contrast
Comparison: 02/11/2017

CLINICAL DATA: Hypothermia. History of colon cancer, CHF, diabetes,
hypertension. Nonsmoker.

EXAM:
PORTABLE CHEST 1 VIEW

[portable]
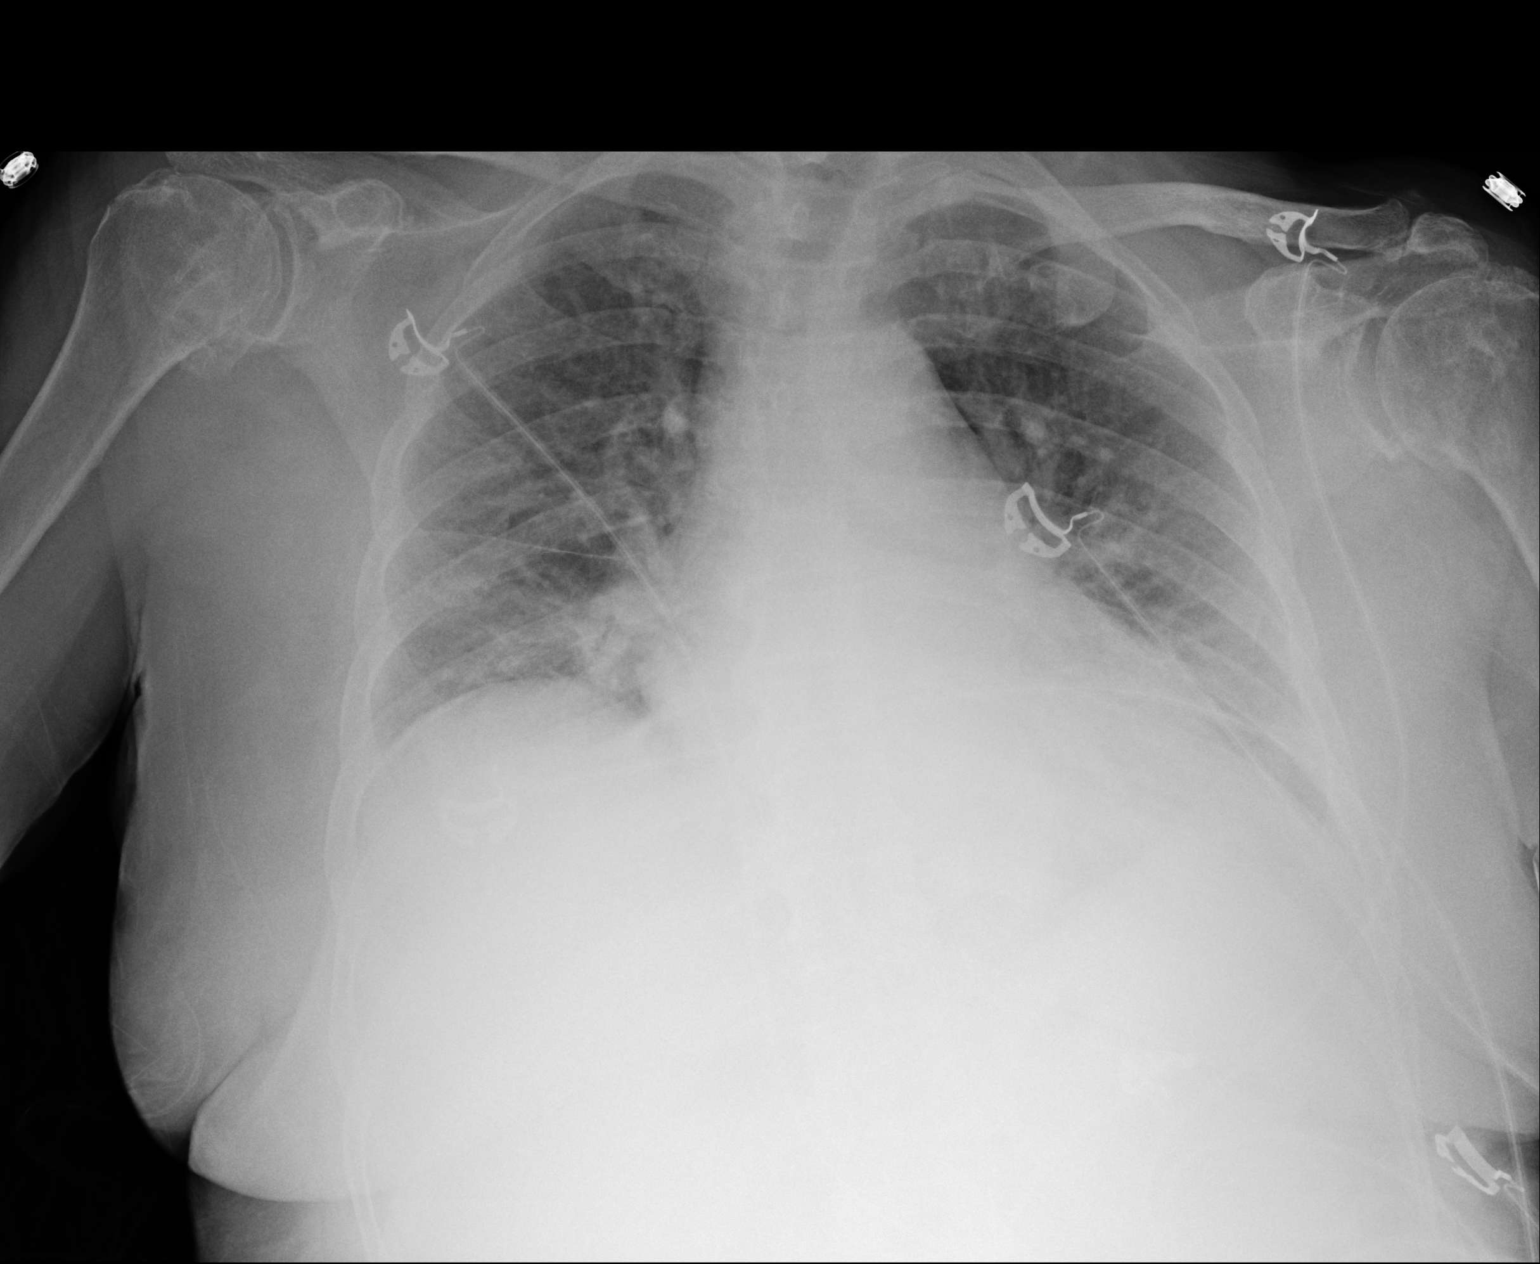

[1 of 1 positions shown; findings below may reference images not displayed]

FINDINGS: Shallow inspiration. Cardiac enlargement with pulmonary vascular
congestion. Infiltration in the lung bases may represent early
edema. No consolidation. No pleural effusions. No pneumothorax.
Degenerative changes in the spine and shoulders.
IMPRESSION: New cardiac enlargement, pulmonary vascular congestion, and mild
basilar edema.

## 2019-07-11 IMAGING — CT CT HEAD W/O CM
3 series · 16 of 47 positions shown, 19 images · non-contrast
Comparison: 09/01/2016

CLINICAL DATA: Acute development of altered mental status today.

EXAM:
CT HEAD WITHOUT CONTRAST
TECHNIQUE: Contiguous axial images were obtained from the base of the skull
through the vertex without intravenous contrast.

[Series 2: head trauma wo · axial · 0.41mm/px · z∈[+17,+142]mm · 10 of 31 slices shown, 13 images]
[im 3/31  brain]
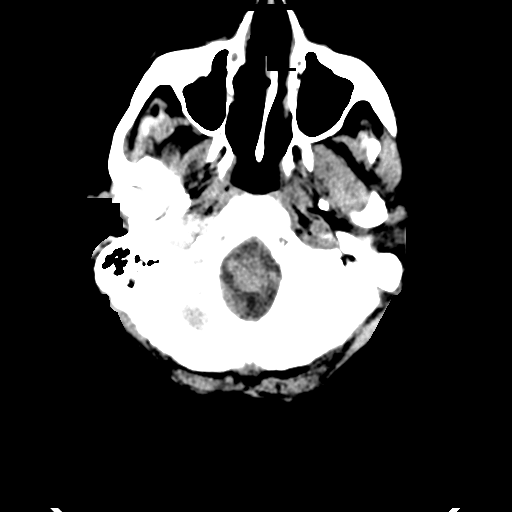
[im 3/31  bone]
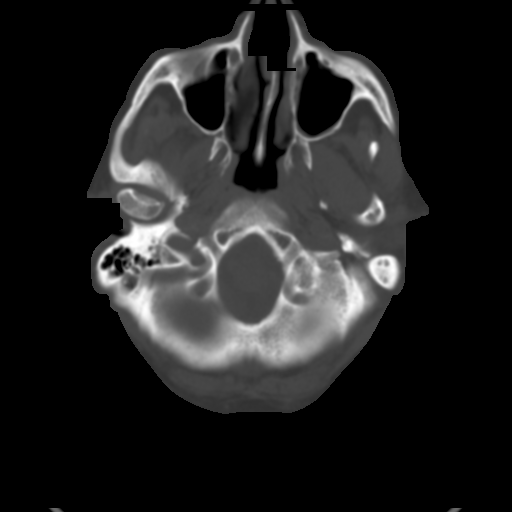
[im 6/31  brain]
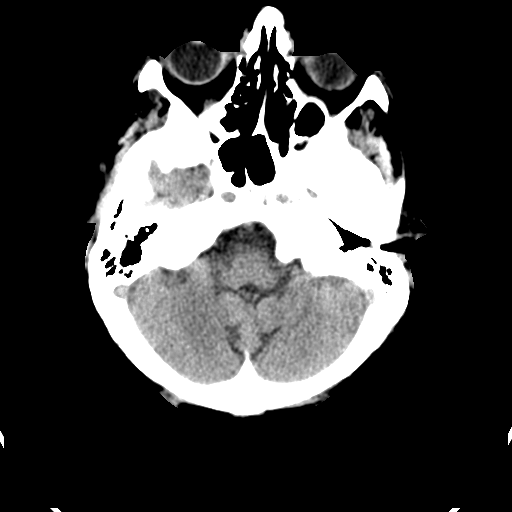
[im 9/31  brain]
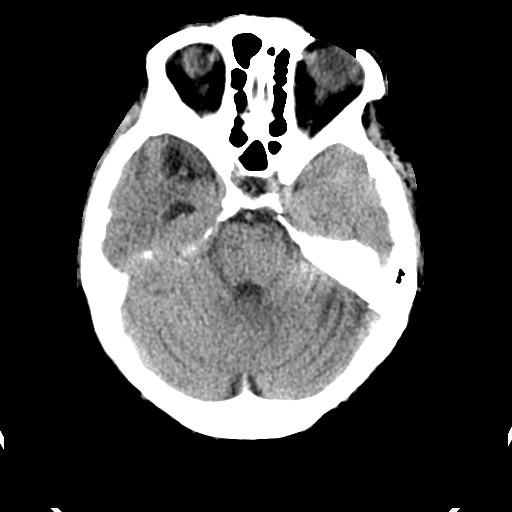
[im 11/31  brain]
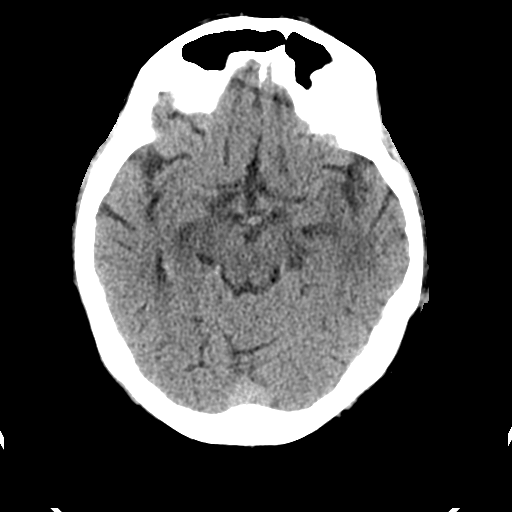
[im 14/31  brain]
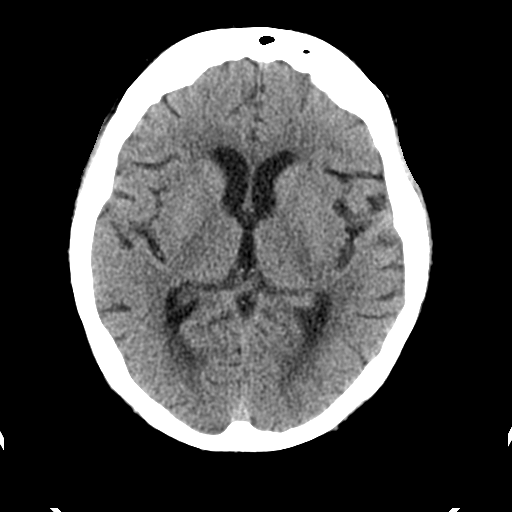
[im 14/31  bone]
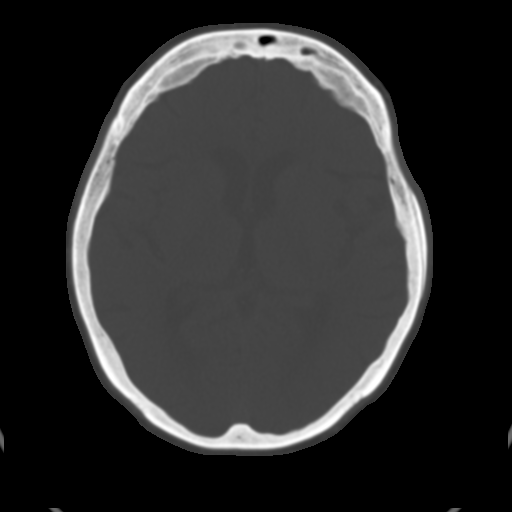
[im 17/31  brain]
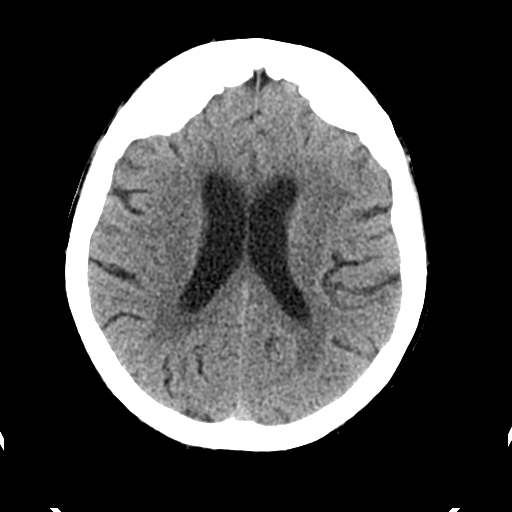
[im 20/31  brain]
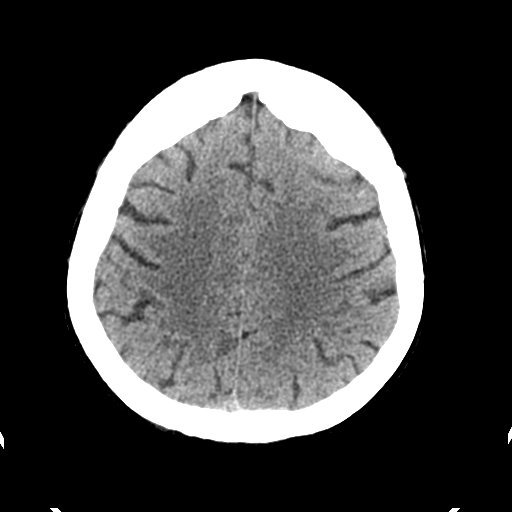
[im 23/31  brain]
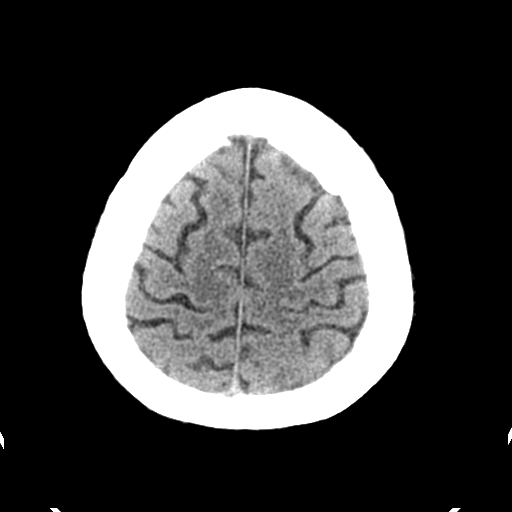
[im 25/31  brain]
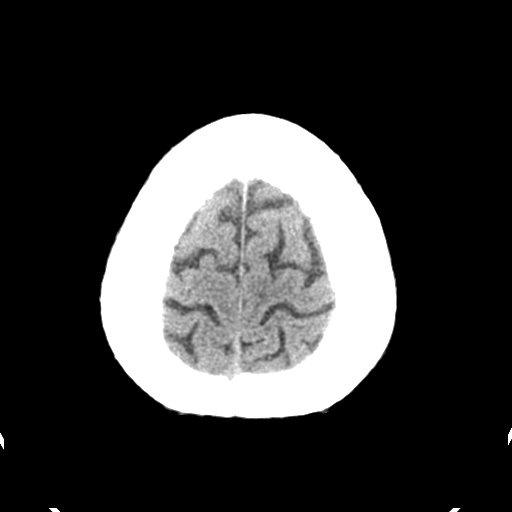
[im 25/31  bone]
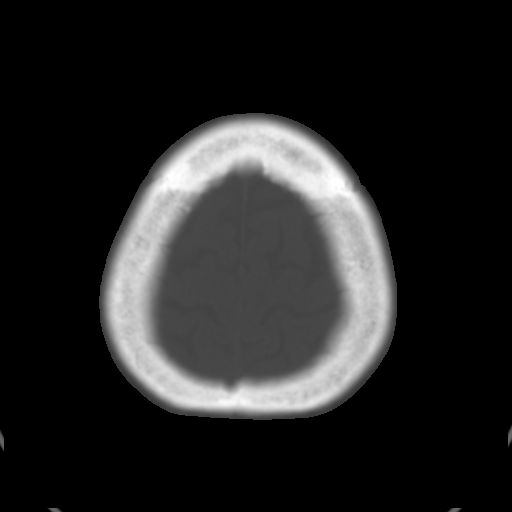
[im 28/31  brain]
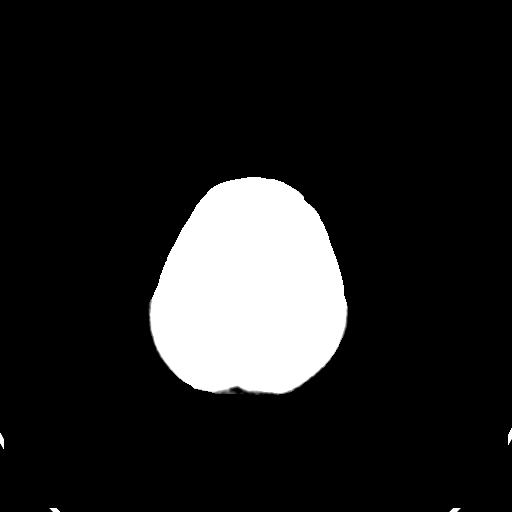

[Series 4: coronal soft tissue · coronal · 0.34mm/px · 3 of 64 slices shown]
[im 22/64  brain]
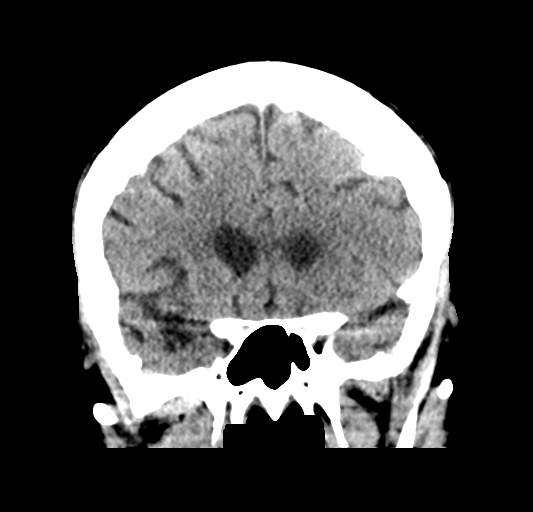
[im 29/64  brain]
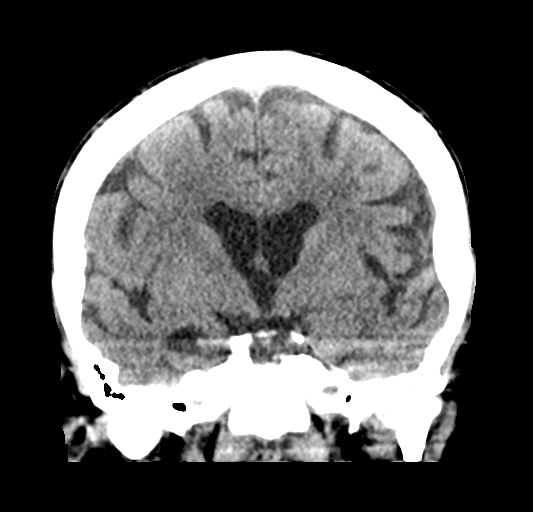
[im 36/64  brain]
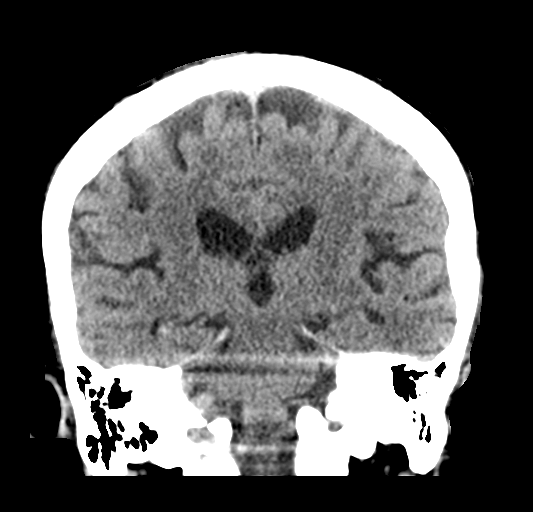

[Series 5: sagittal soft tissue · sagittal · 0.32mm/px · 3 of 56 slices shown]
[im 19/56  brain]
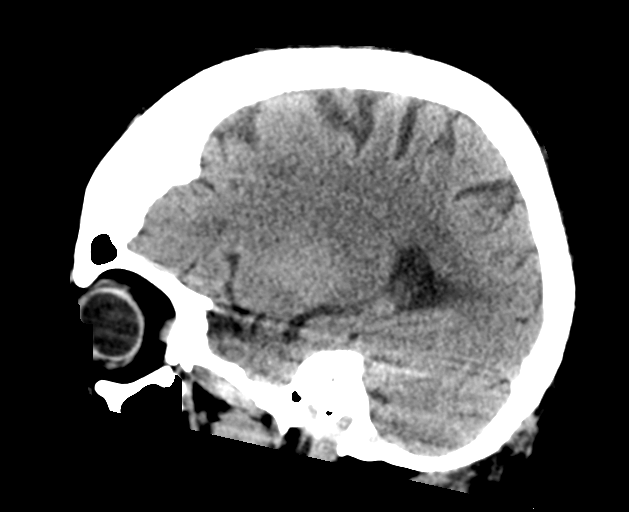
[im 28/56  brain]
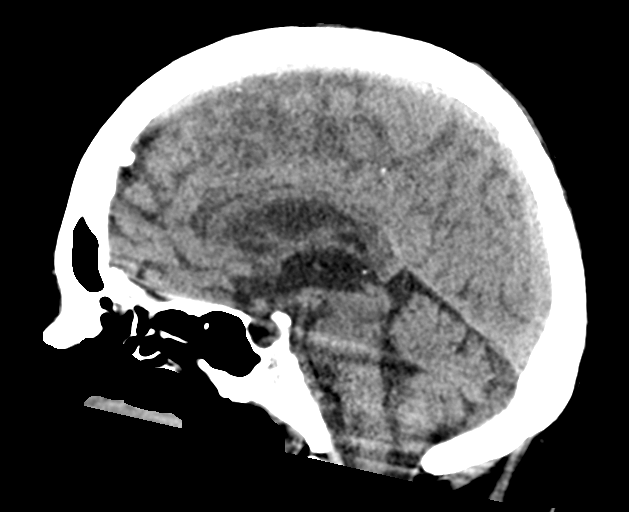
[im 37/56  brain]
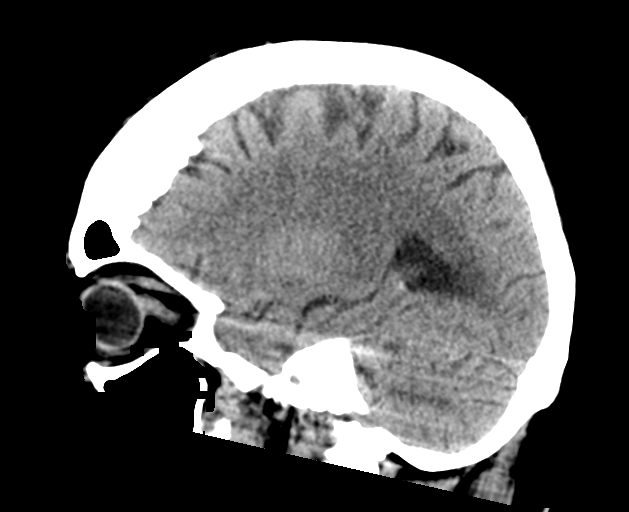

[16 of 47 positions shown; findings below may reference images not displayed]

FINDINGS: Brain: Mild age related volume loss. No sign of acute infarction,
mass lesion, hemorrhage, hydrocephalus or extra-axial collection.

Vascular: No abnormal vascular finding.

Skull: Normal

Sinuses/Orbits: Clear/normal

Other: None
IMPRESSION: No change.  Normal head CT for age.  Mild volume loss.

## 2019-07-21 ENCOUNTER — Inpatient Hospital Stay (HOSPITAL_COMMUNITY)
Admission: EM | Admit: 2019-07-21 | Discharge: 2019-07-29 | DRG: 177 | Disposition: A | Discharge status: Skilled Nursing Facility | Payer: Medicare Other | Attending: Internal Medicine | Admitting: Internal Medicine

## 2019-07-21 ENCOUNTER — Emergency Department (HOSPITAL_COMMUNITY): Payer: Medicare Other

## 2019-07-21 ENCOUNTER — Encounter (HOSPITAL_COMMUNITY): Payer: Self-pay

## 2019-07-21 ENCOUNTER — Other Ambulatory Visit: Payer: Self-pay

## 2019-07-21 DIAGNOSIS — M1990 Unspecified osteoarthritis, unspecified site: Secondary | ICD-10-CM | POA: Diagnosis not present

## 2019-07-21 DIAGNOSIS — Z794 Long term (current) use of insulin: Secondary | ICD-10-CM | POA: Diagnosis not present

## 2019-07-21 DIAGNOSIS — N390 Urinary tract infection, site not specified: Secondary | ICD-10-CM | POA: Diagnosis not present

## 2019-07-21 DIAGNOSIS — Z90710 Acquired absence of both cervix and uterus: Secondary | ICD-10-CM

## 2019-07-21 DIAGNOSIS — Z79899 Other long term (current) drug therapy: Secondary | ICD-10-CM

## 2019-07-21 DIAGNOSIS — B9620 Unspecified Escherichia coli [E. coli] as the cause of diseases classified elsewhere: Secondary | ICD-10-CM | POA: Diagnosis present

## 2019-07-21 DIAGNOSIS — R531 Weakness: Secondary | ICD-10-CM

## 2019-07-21 DIAGNOSIS — H9190 Unspecified hearing loss, unspecified ear: Secondary | ICD-10-CM | POA: Diagnosis present

## 2019-07-21 DIAGNOSIS — R400 Somnolence: Secondary | ICD-10-CM | POA: Diagnosis not present

## 2019-07-21 DIAGNOSIS — Z833 Family history of diabetes mellitus: Secondary | ICD-10-CM | POA: Diagnosis not present

## 2019-07-21 DIAGNOSIS — J1282 Pneumonia due to coronavirus disease 2019: Secondary | ICD-10-CM | POA: Diagnosis not present

## 2019-07-21 DIAGNOSIS — Z883 Allergy status to other anti-infective agents status: Secondary | ICD-10-CM | POA: Diagnosis not present

## 2019-07-21 DIAGNOSIS — R0602 Shortness of breath: Secondary | ICD-10-CM | POA: Diagnosis not present

## 2019-07-21 DIAGNOSIS — M2550 Pain in unspecified joint: Secondary | ICD-10-CM | POA: Diagnosis not present

## 2019-07-21 DIAGNOSIS — F05 Delirium due to known physiological condition: Secondary | ICD-10-CM | POA: Diagnosis not present

## 2019-07-21 DIAGNOSIS — I110 Hypertensive heart disease with heart failure: Secondary | ICD-10-CM | POA: Diagnosis present

## 2019-07-21 DIAGNOSIS — Z888 Allergy status to other drugs, medicaments and biological substances status: Secondary | ICD-10-CM

## 2019-07-21 DIAGNOSIS — G4733 Obstructive sleep apnea (adult) (pediatric): Secondary | ICD-10-CM | POA: Diagnosis present

## 2019-07-21 DIAGNOSIS — F419 Anxiety disorder, unspecified: Secondary | ICD-10-CM | POA: Diagnosis present

## 2019-07-21 DIAGNOSIS — Z85038 Personal history of other malignant neoplasm of large intestine: Secondary | ICD-10-CM

## 2019-07-21 DIAGNOSIS — Z823 Family history of stroke: Secondary | ICD-10-CM | POA: Diagnosis not present

## 2019-07-21 DIAGNOSIS — I5032 Chronic diastolic (congestive) heart failure: Secondary | ICD-10-CM | POA: Diagnosis present

## 2019-07-21 DIAGNOSIS — F0390 Unspecified dementia without behavioral disturbance: Secondary | ICD-10-CM | POA: Diagnosis present

## 2019-07-21 DIAGNOSIS — U071 COVID-19: Principal | ICD-10-CM | POA: Diagnosis present

## 2019-07-21 DIAGNOSIS — I10 Essential (primary) hypertension: Secondary | ICD-10-CM | POA: Diagnosis not present

## 2019-07-21 DIAGNOSIS — R1312 Dysphagia, oropharyngeal phase: Secondary | ICD-10-CM | POA: Diagnosis not present

## 2019-07-21 DIAGNOSIS — Z791 Long term (current) use of non-steroidal anti-inflammatories (NSAID): Secondary | ICD-10-CM | POA: Diagnosis not present

## 2019-07-21 DIAGNOSIS — Z7401 Bed confinement status: Secondary | ICD-10-CM | POA: Diagnosis not present

## 2019-07-21 DIAGNOSIS — E1165 Type 2 diabetes mellitus with hyperglycemia: Secondary | ICD-10-CM | POA: Diagnosis not present

## 2019-07-21 DIAGNOSIS — I160 Hypertensive urgency: Secondary | ICD-10-CM | POA: Diagnosis present

## 2019-07-21 DIAGNOSIS — E119 Type 2 diabetes mellitus without complications: Secondary | ICD-10-CM | POA: Diagnosis not present

## 2019-07-21 DIAGNOSIS — Z9049 Acquired absence of other specified parts of digestive tract: Secondary | ICD-10-CM

## 2019-07-21 DIAGNOSIS — M6281 Muscle weakness (generalized): Secondary | ICD-10-CM | POA: Diagnosis not present

## 2019-07-21 LAB — COMPREHENSIVE METABOLIC PANEL
ALT: 23 U/L (ref 0–44)
AST: 22 U/L (ref 15–41)
Albumin: 3.5 g/dL (ref 3.5–5.0)
Alkaline Phosphatase: 72 U/L (ref 38–126)
Anion gap: 11 (ref 5–15)
BUN: 24 mg/dL — ABNORMAL HIGH (ref 8–23)
CO2: 26 mmol/L (ref 22–32)
Calcium: 8.7 mg/dL — ABNORMAL LOW (ref 8.9–10.3)
Chloride: 99 mmol/L (ref 98–111)
Creatinine, Ser: 1.02 mg/dL — ABNORMAL HIGH (ref 0.44–1.00)
GFR calc Af Amer: 58 mL/min — ABNORMAL LOW (ref >60)
GFR calc non Af Amer: 50 mL/min — ABNORMAL LOW (ref >60)
Glucose, Bld: 289 mg/dL — ABNORMAL HIGH (ref 70–99)
Potassium: 4.1 mmol/L (ref 3.5–5.1)
Sodium: 136 mmol/L (ref 135–145)
Total Bilirubin: 0.8 mg/dL (ref 0.3–1.2)
Total Protein: 8 g/dL (ref 6.5–8.1)

## 2019-07-21 LAB — CBC WITH DIFFERENTIAL/PLATELET
Abs Immature Granulocytes: 0.01 10*3/uL (ref 0.00–0.07)
Basophils Absolute: 0 10*3/uL (ref 0.0–0.1)
Basophils Relative: 0 %
Eosinophils Absolute: 0 10*3/uL (ref 0.0–0.5)
Eosinophils Relative: 0 %
HCT: 43.3 % (ref 36.0–46.0)
Hemoglobin: 14.3 g/dL (ref 12.0–15.0)
Immature Granulocytes: 0 %
Lymphocytes Relative: 28 %
Lymphs Abs: 1.8 10*3/uL (ref 0.7–4.0)
MCH: 29.1 pg (ref 26.0–34.0)
MCHC: 33 g/dL (ref 30.0–36.0)
MCV: 88.2 fL (ref 80.0–100.0)
Monocytes Absolute: 0.6 10*3/uL (ref 0.1–1.0)
Monocytes Relative: 10 %
Neutro Abs: 3.8 10*3/uL (ref 1.7–7.7)
Neutrophils Relative %: 62 %
Platelets: 211 10*3/uL (ref 150–400)
RBC: 4.91 MIL/uL (ref 3.87–5.11)
RDW: 13.5 % (ref 11.5–15.5)
WBC: 6.3 10*3/uL (ref 4.0–10.5)
nRBC: 0 % (ref 0.0–0.2)

## 2019-07-21 LAB — TROPONIN I (HIGH SENSITIVITY): Troponin I (High Sensitivity): 14 ng/L (ref <18)

## 2019-07-21 LAB — LACTIC ACID, PLASMA: Lactic Acid, Venous: 1.3 mmol/L (ref 0.5–1.9)

## 2019-07-21 LAB — PROTIME-INR
INR: 1 (ref 0.8–1.2)
Prothrombin Time: 13 seconds (ref 11.4–15.2)

## 2019-07-21 LAB — POC SARS CORONAVIRUS 2 AG -  ED: SARS Coronavirus 2 Ag: POSITIVE — AB

## 2019-07-21 LAB — BRAIN NATRIURETIC PEPTIDE: B Natriuretic Peptide: 112.8 pg/mL — ABNORMAL HIGH (ref 0.0–100.0)

## 2019-07-21 LAB — APTT: aPTT: 51 seconds — ABNORMAL HIGH (ref 24–36)

## 2019-07-21 NOTE — ED Notes (Signed)
Family member called and update provided.

## 2019-07-21 NOTE — ED Notes (Signed)
Only able to obtain one set of blood cultures due to patient being a difficult stick. Patient stuck multiple times by multiple staff members with no success.
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
## 2019-07-21 NOTE — ED Notes (Signed)
Patient made aware that urine sample is needed. Patient placed at [facility] at this time.

## 2019-07-21 NOTE — ED Triage Notes (Addendum)
Pt reports SOB and fatigue developing over the last few days. She has had a positive exposure to COVID in her household (the person who brought her to the ED is +)

## 2019-07-21 NOTE — ED Notes (Signed)
EKG given to EDP,Floyd,MD., for review. 

## 2019-07-22 DIAGNOSIS — U071 COVID-19: Principal | ICD-10-CM

## 2019-07-22 LAB — GLUCOSE, CAPILLARY
Glucose-Capillary: 258 mg/dL — ABNORMAL HIGH (ref 70–99)
Glucose-Capillary: 253 mg/dL — ABNORMAL HIGH (ref 70–99)
Glucose-Capillary: 250 mg/dL — ABNORMAL HIGH (ref 70–99)

## 2019-07-22 LAB — URINALYSIS, ROUTINE W REFLEX MICROSCOPIC
Bilirubin Urine: NEGATIVE
Glucose, UA: NEGATIVE mg/dL
Hgb urine dipstick: NEGATIVE
Ketones, ur: NEGATIVE mg/dL
Nitrite: NEGATIVE
Protein, ur: >=300 mg/dL — AB
Specific Gravity, Urine: 1.016 (ref 1.005–1.030)
pH: 5 (ref 5.0–8.0)

## 2019-07-22 LAB — ABO/RH: ABO/RH(D): A POS

## 2019-07-22 MED ORDER — SODIUM CHLORIDE 0.9% FLUSH
3.0000 mL | Freq: Two times a day (BID) | INTRAVENOUS | Status: DC
  Administered 2019-07-22 – 2019-07-26 (×9): 3 mL via INTRAVENOUS

## 2019-07-22 MED ORDER — INSULIN ASPART 100 UNIT/ML ~~LOC~~ SOLN
0.0000 [IU] | Freq: Every day | SUBCUTANEOUS | Status: DC
  Administered 2019-07-22: 2 [IU] via SUBCUTANEOUS
  Filled 2019-07-22: qty 0.05

## 2019-07-22 MED ORDER — BIOTIN 2.5 MG PO TABS: ORAL_TABLET | Freq: Every day | ORAL | Status: DC

## 2019-07-22 MED ORDER — AMLODIPINE BESYLATE 10 MG PO TABS
10.0000 mg | ORAL_TABLET | Freq: Every morning | ORAL | Status: DC
  Administered 2019-07-22 – 2019-07-29 (×7): 10 mg via ORAL
  Filled 2019-07-22 (×6): qty 1
  Filled 2019-07-22: qty 2

## 2019-07-22 MED ORDER — ZINC SULFATE 220 (50 ZN) MG PO CAPS
220.0000 mg | ORAL_CAPSULE | Freq: Every day | ORAL | Status: DC
  Administered 2019-07-22 – 2019-07-29 (×7): 220 mg via ORAL
  Filled 2019-07-22 (×7): qty 1

## 2019-07-22 MED ORDER — CARVEDILOL 6.25 MG PO TABS
6.2500 mg | ORAL_TABLET | Freq: Two times a day (BID) | ORAL | Status: DC
  Administered 2019-07-22 – 2019-07-25 (×7): 6.25 mg via ORAL
  Filled 2019-07-22 (×11): qty 1

## 2019-07-22 MED ORDER — BENZONATATE 100 MG PO CAPS
200.0000 mg | ORAL_CAPSULE | Freq: Three times a day (TID) | ORAL | Status: DC
  Administered 2019-07-22 – 2019-07-29 (×16): 200 mg via ORAL
  Filled 2019-07-22 (×18): qty 2

## 2019-07-22 MED ORDER — HYDRALAZINE HCL 20 MG/ML IJ SOLN
10.0000 mg | Freq: Four times a day (QID) | INTRAMUSCULAR | Status: DC | PRN
  Administered 2019-07-22 – 2019-07-25 (×6): 10 mg via INTRAVENOUS
  Filled 2019-07-22 (×8): qty 1

## 2019-07-22 MED ORDER — SODIUM CHLORIDE 0.9% FLUSH: 3.0000 mL | INTRAVENOUS | Status: DC | PRN

## 2019-07-22 MED ORDER — DEXAMETHASONE SODIUM PHOSPHATE 10 MG/ML IJ SOLN
6.0000 mg | Freq: Every day | INTRAMUSCULAR | Status: DC
  Administered 2019-07-22 – 2019-07-26 (×5): 6 mg via INTRAVENOUS
  Filled 2019-07-22 (×5): qty 1

## 2019-07-22 MED ORDER — SODIUM CHLORIDE 0.9 % IV SOLN
100.0000 mg | Freq: Every day | INTRAVENOUS | Status: AC
  Administered 2019-07-23 – 2019-07-26 (×4): 100 mg via INTRAVENOUS
  Filled 2019-07-22 (×4): qty 100

## 2019-07-22 MED ORDER — ONDANSETRON HCL 4 MG PO TABS: 4.0000 mg | ORAL_TABLET | Freq: Four times a day (QID) | ORAL | Status: DC | PRN

## 2019-07-22 MED ORDER — DOCUSATE SODIUM 100 MG PO CAPS
100.0000 mg | ORAL_CAPSULE | Freq: Two times a day (BID) | ORAL | Status: DC
  Administered 2019-07-22 – 2019-07-29 (×11): 100 mg via ORAL
  Filled 2019-07-22 (×12): qty 1

## 2019-07-22 MED ORDER — MIRTAZAPINE 15 MG PO TABS
15.0000 mg | ORAL_TABLET | Freq: Every evening | ORAL | Status: DC | PRN
  Filled 2019-07-22: qty 1

## 2019-07-22 MED ORDER — SODIUM CHLORIDE 0.9 % IV SOLN
250.0000 mL | INTRAVENOUS | Status: DC | PRN
  Administered 2019-07-22: 250 mL via INTRAVENOUS

## 2019-07-22 MED ORDER — FUROSEMIDE 40 MG PO TABS
40.0000 mg | ORAL_TABLET | Freq: Every morning | ORAL | Status: DC
  Administered 2019-07-23 – 2019-07-26 (×4): 40 mg via ORAL
  Filled 2019-07-22 (×5): qty 1

## 2019-07-22 MED ORDER — HYDROCOD POLST-CPM POLST ER 10-8 MG/5ML PO SUER: 5.0000 mL | Freq: Two times a day (BID) | ORAL | Status: DC | PRN

## 2019-07-22 MED ORDER — ONDANSETRON HCL 4 MG/2ML IJ SOLN: 4.0000 mg | Freq: Four times a day (QID) | INTRAMUSCULAR | Status: DC | PRN

## 2019-07-22 MED ORDER — ASCORBIC ACID 500 MG PO TABS
500.0000 mg | ORAL_TABLET | Freq: Every day | ORAL | Status: DC
  Administered 2019-07-22 – 2019-07-29 (×7): 500 mg via ORAL
  Filled 2019-07-22 (×7): qty 1

## 2019-07-22 MED ORDER — SODIUM CHLORIDE 0.9 % IV SOLN
200.0000 mg | Freq: Once | INTRAVENOUS | Status: AC
  Administered 2019-07-22: 200 mg via INTRAVENOUS
  Filled 2019-07-22: qty 200

## 2019-07-22 MED ORDER — INSULIN ASPART 100 UNIT/ML ~~LOC~~ SOLN
0.0000 [IU] | Freq: Three times a day (TID) | SUBCUTANEOUS | Status: DC
  Administered 2019-07-22: 5 [IU] via SUBCUTANEOUS
  Administered 2019-07-23: 7 [IU] via SUBCUTANEOUS
  Filled 2019-07-22: qty 0.09

## 2019-07-22 MED ORDER — ACETAMINOPHEN 325 MG PO TABS
650.0000 mg | ORAL_TABLET | Freq: Four times a day (QID) | ORAL | Status: DC | PRN
  Administered 2019-07-29: 650 mg via ORAL
  Filled 2019-07-22: qty 2

## 2019-07-22 MED ORDER — GLIPIZIDE ER 10 MG PO TB24
10.0000 mg | ORAL_TABLET | Freq: Every day | ORAL | Status: DC
  Administered 2019-07-23 – 2019-07-25 (×2): 10 mg via ORAL
  Filled 2019-07-22: qty 1
  Filled 2019-07-22: qty 2
  Filled 2019-07-22: qty 1

## 2019-07-22 MED ORDER — GUAIFENESIN-DM 100-10 MG/5ML PO SYRP: 10.0000 mL | ORAL_SOLUTION | ORAL | Status: DC | PRN

## 2019-07-22 MED ORDER — INSULIN GLARGINE 100 UNIT/ML ~~LOC~~ SOLN
20.0000 [IU] | Freq: Every day | SUBCUTANEOUS | Status: DC
  Administered 2019-07-22 – 2019-07-24 (×2): 20 [IU] via SUBCUTANEOUS
  Filled 2019-07-22 (×4): qty 0.2

## 2019-07-22 MED ORDER — METFORMIN HCL ER 500 MG PO TB24
500.0000 mg | ORAL_TABLET | Freq: Two times a day (BID) | ORAL | Status: DC
  Administered 2019-07-23 – 2019-07-25 (×3): 500 mg via ORAL
  Filled 2019-07-22 (×6): qty 1

## 2019-07-22 NOTE — H&P (Signed)
History and Physical    [name] [id number] DOB: 11-04-1930 DOA: 07/21/2019  PCP: [name], MD  Patient coming from: Home  Chief Complaint: Weakness  HPI: [name] is a 86 y.o. female with medical history significant of congestive heart failure, diabetes, hypertension sent in by family members due to progressive worsening weakness.  Everyone in the hospital has been having Covid.  Patient is Covid positive.  She denies any nausea vomiting diarrhea.  She denies any shortness of breath.  Patient was unable to ambulate in emergency department so she was referred for admission for physical therapy.  Review of Systems: As per HPI otherwise 10 point review of systems negative suspect unreliable history however patient very poor historian  Past Medical History:  Diagnosis Date  . Anxiety   . Arthritis   . Cancer [facility]) 2006   colon  . Cataract   . CHF (congestive heart failure) ([place]) 05/2016   preserved EF, grade 2 diastolic dysfunction  . Diabetes mellitus without complication ([place])   . Diverticulitis   . Erythroderma desquamativum   . Hypertension     Past Surgical History:  Procedure Laterality Date  . ABDOMINAL HYSTERECTOMY    . APPENDECTOMY    . COLON SURGERY       reports that she has never smoked. She has never used smokeless tobacco. She reports that she does not drink alcohol or use drugs.  Allergies  Allergen Reactions  . Ace Inhibitors Dermatitis  . Glipizide     rash  . Neosporin [Neomycin-Bacitracin Zn-Polymyx] Dermatitis  . Spironolactone     rash  . Diovan [Valsartan] Rash    Family History  Problem Relation Age of Onset  . Stroke Mother   . Diabetes Father   . Cancer Sister   . Diabetes Sister   . Cancer Brother   . Cancer Brother   . Diabetes Sister   . Stroke Sister     Prior to Admission medications   Medication Sig Start Date End Date Taking? Authorizing Provider  ALPRAZolam Duanne Moron) 0.5 MG tablet Take 1 tablet (0.5 mg total)  by mouth 3 (three) times daily. 05/08/17   Orson Eva, MD  amLODipine (NORVASC) 5 MG tablet Take 2 tablets (10 mg total) by mouth daily. Patient taking differently: Take 5 mg by mouth daily.  02/28/17   Dessa Phi, DO  BIOTIN PO Take 1 tablet by mouth daily.    [provider]  carvedilol (COREG) 6.25 MG tablet Take 1 tablet (6.25 mg total) by mouth 2 (two) times daily with a meal. 05/08/17   Tat, Shanon Brow, MD  cetirizine (ZYRTEC) 10 MG tablet Take 10 mg by mouth daily.    [provider]  diclofenac sodium (VOLTAREN) 1 % GEL Apply 4 g topically 4 (four) times daily. To knees 05/08/17   Tat, Shanon Brow, MD  docusate sodium (COLACE) 100 MG capsule Take 1 capsule (100 mg total) by mouth 2 (two) times daily. 02/28/17   Dessa Phi, DO  furosemide (LASIX) 20 MG tablet Take 1 tablet (20 mg total) by mouth daily before supper. 05/08/17   Orson Eva, MD  furosemide (LASIX) 40 MG tablet Take 1 tablet (40 mg total) by mouth every morning. 05/08/17   Orson Eva, MD  glipiZIDE (GLUCOTROL XL) 10 MG 24 hr tablet Take 1 tablet by mouth daily. 04/20/17   [provider]  HUMALOG KWIKPEN 200 UNIT/ML SOPN Inject 0-15 Units into the skin 3 (three) times daily with meals. CBG  70-120--no units; 121-150--2 units; 151-200--3 units; 201-250--5 units; 251-300--8 units; 301-350--11 units; 351-400--15 units 05/08/17   Tat, Treshun Wold, MD  insulin glargine (LANTUS) 100 UNIT/ML injection Inject 0.2 mLs (20 Units total) into the skin at bedtime. 05/08/17   Orson Eva, MD  Multiple Vitamin (MULTIVITAMIN) tablet Take 1 tablet by mouth daily.    [provider]  multivitamin-lutein (OCUVITE-LUTEIN) CAPS capsule Take 1 capsule by mouth daily.    [provider]  ondansetron (ZOFRAN ODT) 4 MG disintegrating tablet Take 1 tablet (4 mg total) by mouth every 8 (eight) hours as needed for nausea. 04/23/18   Larene Pickett, PA-C  potassium chloride SA (K-DUR,KLOR-CON) 10 MEQ tablet Take 1 tablet (10  mEq total) by mouth daily. 05/09/17   Orson Eva, MD  prednisoLONE acetate (PRED FORTE) 1 % ophthalmic suspension Place 1 drop into both eyes 4 (four) times daily. 02/09/17   [provider]  triamcinolone cream (KENALOG) 0.1 % APPLY TWICE DAILY 01/13/12   Rise Mu, PA-C    Physical Exam: Vitals:   07/21/19 2130 07/21/19 2230 07/21/19 2330 07/22/19 0100  BP: (!) 178/67 (!) 187/92 (!) 179/78 (!) 195/90  Pulse: 73 79 75 77  Resp: (!) 29 (!) 23 (!) 27 17  Temp:      TempSrc:      SpO2: 94% 94% 94% 94%      Constitutional: NAD, calm, comfortable Vitals:   07/21/19 2130 07/21/19 2230 07/21/19 2330 07/22/19 0100  BP: (!) 178/67 (!) 187/92 (!) 179/78 (!) 195/90  Pulse: 73 79 75 77  Resp: (!) 29 (!) 23 (!) 27 17  Temp:      TempSrc:      SpO2: 94% 94% 94% 94%   Eyes: PERRL, lids and conjunctivae normal ENMT: Mucous membranes are moist. Posterior pharynx clear of any exudate or lesions.Normal dentition.  Neck: normal, supple, no masses, no thyromegaly Respiratory: clear to auscultation bilaterally, no wheezing, no crackles. Normal respiratory effort. No accessory muscle use.  Cardiovascular: Regular rate and rhythm, no murmurs / rubs / gallops. No extremity edema. 2+ pedal pulses. No carotid bruits.  Abdomen: no tenderness, no masses palpated. No hepatosplenomegaly. Bowel sounds positive.  Musculoskeletal: no clubbing / cyanosis. No joint deformity upper and lower extremities. Good ROM, no contractures. Normal muscle tone.  Skin: no rashes, lesions, ulcers. No induration Neurologic: CN 2-12 grossly intact. Sensation intact, DTR normal. Strength 5/5 in all 4.  Psychiatric: Normal judgment and insight. Alert and oriented x 2. Normal mood.    Labs on Admission: I have personally reviewed following labs and imaging studies  CBC: Recent Labs  Lab 07/21/19 2220  WBC 6.3  NEUTROABS 3.8  HGB 14.3  HCT 43.3  MCV 88.2  PLT 123456   Basic Metabolic Panel: Recent Labs  Lab  07/21/19 2220  NA 136  K 4.1  CL 99  CO2 26  GLUCOSE 289*  BUN 24*  CREATININE 1.02*  CALCIUM 8.7*   GFR: CrCl cannot be calculated (Unknown ideal weight.). Liver Function Tests: Recent Labs  Lab 07/21/19 2220  AST 22  ALT 23  ALKPHOS 72  BILITOT 0.8  PROT 8.0  ALBUMIN 3.5   No results for input(s): LIPASE, AMYLASE in the last 168 hours. No results for input(s): AMMONIA in the last 168 hours. Coagulation Profile: Recent Labs  Lab 07/21/19 2220  INR 1.0   Cardiac Enzymes: No results for input(s): CKTOTAL, CKMB, CKMBINDEX, TROPONINI in the last 168 hours. BNP (last 3 results) No  results for input(s): PROBNP in the last 8760 hours. HbA1C: No results for input(s): HGBA1C in the last 72 hours. CBG: No results for input(s): GLUCAP in the last 168 hours. Lipid Profile: No results for input(s): CHOL, HDL, LDLCALC, TRIG, CHOLHDL, LDLDIRECT in the last 72 hours. Thyroid Function Tests: No results for input(s): TSH, T4TOTAL, FREET4, T3FREE, THYROIDAB in the last 72 hours. Anemia Panel: No results for input(s): VITAMINB12, FOLATE, FERRITIN, TIBC, IRON, RETICCTPCT in the last 72 hours. Urine analysis:    Component Value Date/Time   COLORURINE AMBER (A) 05/04/2017 0736   APPEARANCEUR TURBID (A) 05/04/2017 0736   LABSPEC 1.020 05/04/2017 0736   PHURINE 6.0 05/04/2017 0736   GLUCOSEU NEGATIVE 05/04/2017 0736   HGBUR NEGATIVE 05/04/2017 0736   BILIRUBINUR NEGATIVE 05/04/2017 0736   KETONESUR NEGATIVE 05/04/2017 0736   PROTEINUR 30 (A) 05/04/2017 0736   UROBILINOGEN 0.2 08/16/2010 2011   NITRITE NEGATIVE 05/04/2017 0736   LEUKOCYTESUR LARGE (A) 05/04/2017 0736   Sepsis Labs: !!!!!!!!!!!!!!!!!!!!!!!!!!!!!!!!!!!!!!!!!!!! @LABRCNTIP (procalcitonin:4,lacticidven:4) )No results found for this or any previous visit (from the past 240 hour(s)).   Radiological Exams on Admission: DG Chest Port 1 View  Result Date: 07/21/2019 CLINICAL DATA:  Shortness of breath and fatigue.  EXAM: PORTABLE CHEST 1 VIEW COMPARISON:  May 04, 2017 FINDINGS: There is no evidence of acute infiltrate, pleural effusion or pneumothorax. The cardiac silhouette is mildly enlarged. Multilevel degenerative changes seen throughout the thoracic spine. IMPRESSION: 1. No acute or active cardiopulmonary disease. Electronically Signed   By: Virgina Norfolk M.D.   On: 07/21/2019 22:22   Old chart reviewed Case discussed with EDP  Assessment/Plan 86 year old female with generalized weakness with Covid infection Principal Problem:   [id number] sats are normal below normal at 92%.  Chest x-ray is negative.  Does not qualify for Decadron or remdesivir at this time.  Supportive care.  Active Problems:   Cammy Brochure to Covid infection.  Physical therapy eval in the morning.    Essential hypertension-resume home meds    Chronic diastolic CHF (congestive heart failure) (HCC)-compensated at this time      DVT prophylaxis: SCDs Code Status: Full Family Communication: None Disposition Plan: Tomorrow Consults called: None physical therapy Admission status: Observation   Jayesh Marbach A MD [facility]  If 7PM-7AM, please contact night-coverage [email or web address] Password [email or web address]  07/22/2019, 1:19 AM

## 2019-07-22 NOTE — ED Notes (Signed)
Attempted to call report. No answer.

## 2019-07-22 NOTE — Progress Notes (Signed)
Pt has tele orders. Report from [place] RN/Day shift stated that she wasn't going to need tele. Order for tele remained active. Message sent via amion requesting D/C of tele as no tele available on unit at this time, await response

## 2019-07-22 NOTE — ED Notes (Signed)
ED TO INPATIENT HANDOFF REPORT  ED Nurse Name and Phone #:  [phone]  S Name/Age/Gender [name] 86 y.o. female Room/Bed: WA15/WA15  Code Status   Code Status: Full Code  Home/SNF/Other home Patient oriented to: self, place, time and situation Is this baseline? Yes   Triage Complete: Triage complete  Chief Complaint COVID-19 [U07.1]  Triage Note Pt reports SOB and fatigue developing over the last few days. She has had a positive exposure to COVID in her household (the person who brought her to the ED is +)    Allergies Allergies  Allergen Reactions  . Ace Inhibitors Dermatitis  . Glipizide     rash  . Neosporin [Neomycin-Bacitracin Zn-Polymyx] Dermatitis  . Spironolactone     rash  . Diovan [Valsartan] Rash    Level of Care/Admitting Diagnosis ED Disposition    ED Disposition Condition Comment   Admit  Hospital Area: [facility] [[id number]]  Level of Care: Med-Surg [16]  Covid Evaluation: Confirmed COVID Positive  Diagnosis: COVID-19 [[id number]]  Admitting Physician: Phillips Grout [[id number]]  Attending Physician: Derrill Kay A [[id number]]       B Medical/Surgery History Past Medical History:  Diagnosis Date  . Anxiety   . Arthritis   . Cancer [facility]) 2006   colon  . Cataract   . CHF (congestive heart failure) ([place]) 05/2016   preserved EF, grade 2 diastolic dysfunction  . Diabetes mellitus without complication ([place])   . Diverticulitis   . Erythroderma desquamativum   . Hypertension    Past Surgical History:  Procedure Laterality Date  . ABDOMINAL HYSTERECTOMY    . APPENDECTOMY    . COLON SURGERY       A IV Location/Drains/Wounds Patient Lines/Drains/Airways Status   Active Line/Drains/Airways    Name:   Placement date:   Placement time:   Site:   Days:   Peripheral IV 07/21/19 Left Antecubital   07/21/19    2224    Antecubital   1   Peripheral IV 07/21/19 Right Wrist   07/21/19    2225    Wrist   1   External  Urinary Catheter   05/05/17    --    --   808   Wound / Incision (Open or Dehisced) 08/31/16 Non-pressure wound Toe (Comment  which one) Left WOC updated to non pressure injury   08/31/16    --    Toe (Comment  which one)   1055   Wound / Incision (Open or Dehisced) 02/22/17 Diabetic ulcer Leg Right   02/22/17    --    Leg   880          Intake/Output Last 24 hours  Intake/Output Summary (Last 24 hours) at 07/22/2019 1609 Last data filed at 07/22/2019 1248 Gross per 24 hour  Intake --  Output 500 ml  Net -500 ml    Labs/Imaging Results for orders placed or performed during the hospital encounter of 07/21/19 (from the past 48 hour(s))  Lactic acid, plasma     Status: None   Collection Time: 07/21/19 10:20 PM  Result Value Ref Range   Lactic Acid, Venous 1.3 0.5 - 1.9 mmol/L    Comment: Performed at [facility], [place] [address] [place], [place] [address]  Comprehensive metabolic panel     Status: Abnormal   Collection Time: 07/21/19 10:20 PM  Result Value Ref Range   Sodium 136 135 - 145 mmol/L   Potassium 4.1  3.5 - 5.1 mmol/L   Chloride 99 98 - 111 mmol/L   CO2 26 22 - 32 mmol/L   Glucose, Bld 289 (H) 70 - 99 mg/dL   BUN 24 (H) 8 - 23 mg/dL   Creatinine, Ser 1.02 (H) 0.44 - 1.00 mg/dL   Calcium 8.7 (L) 8.9 - 10.3 mg/dL   Total Protein 8.0 6.5 - 8.1 g/dL   Albumin 3.5 3.5 - 5.0 g/dL   AST 22 15 - 41 U/L   ALT 23 0 - 44 U/L   Alkaline Phosphatase 72 38 - 126 U/L   Total Bilirubin 0.8 0.3 - 1.2 mg/dL   GFR calc non Af Amer 50 (L) >60 mL/min   GFR calc Af Amer 58 (L) >60 mL/min   Anion gap 11 5 - 15    Comment: Performed at [facility], [place] [address] [place], [place] [address]  CBC WITH DIFFERENTIAL     Status: None   Collection Time: 07/21/19 10:20 PM  Result Value Ref Range   WBC 6.3 4.0 - 10.5 K/uL   RBC 4.91 3.87 - 5.11 MIL/uL   Hemoglobin 14.3 12.0 - 15.0 g/dL   HCT 43.3 36.0 - 46.0 %   MCV 88.2 80.0 - 100.0 fL   MCH 29.1  26.0 - 34.0 pg   MCHC 33.0 30.0 - 36.0 g/dL   RDW 13.5 11.5 - 15.5 %   Platelets 211 150 - 400 K/uL   nRBC 0.0 0.0 - 0.2 %   Neutrophils Relative % 62 %   Neutro Abs 3.8 1.7 - 7.7 K/uL   Lymphocytes Relative 28 %   Lymphs Abs 1.8 0.7 - 4.0 K/uL   Monocytes Relative 10 %   Monocytes Absolute 0.6 0.1 - 1.0 K/uL   Eosinophils Relative 0 %   Eosinophils Absolute 0.0 0.0 - 0.5 K/uL   Basophils Relative 0 %   Basophils Absolute 0.0 0.0 - 0.1 K/uL   Immature Granulocytes 0 %   Abs Immature Granulocytes 0.01 0.00 - 0.07 K/uL    Comment: Performed at [facility], [place] [address] [place], [place] [address]  APTT     Status: Abnormal   Collection Time: 07/21/19 10:20 PM  Result Value Ref Range   aPTT 51 (H) 24 - 36 seconds    Comment:        IF BASELINE aPTT IS ELEVATED, SUGGEST PATIENT RISK ASSESSMENT BE USED TO DETERMINE APPROPRIATE ANTICOAGULANT THERAPY. Performed at [facility], [place] [address], [place], [place] [address]   Protime-INR     Status: None   Collection Time: 07/21/19 10:20 PM  Result Value Ref Range   Prothrombin Time 13.0 11.4 - 15.2 seconds   INR 1.0 0.8 - 1.2    Comment: (NOTE) INR goal varies based on device and disease states. Performed at [facility], [place] [address], [place], [place] [address]   Troponin I (High Sensitivity)     Status: None   Collection Time: 07/21/19 10:20 PM  Result Value Ref Range   Troponin I (High Sensitivity) 14 <18 ng/L    Comment: (NOTE) Elevated high sensitivity troponin I (hsTnI) values and significant  changes across serial measurements may suggest ACS but many other  chronic and acute conditions are known to elevate hsTnI results.  Refer to the "Links" section for chest pain algorithms and additional  guidance. Performed at [facility], [place] [address] [place], [place] [address]   Brain natriuretic  peptide     Status: Abnormal   Collection Time:  07/21/19 10:20 PM  Result Value Ref Range   B Natriuretic Peptide 112.8 (H) 0.0 - 100.0 pg/mL    Comment: Performed at [facility], [place] [address], [place], [place] [address]  Blood Culture (routine x 2)     Status: None (Preliminary result)   Collection Time: 07/21/19 10:21 PM   Specimen: BLOOD  Result Value Ref Range   Specimen Description      BLOOD SITE NOT SPECIFIED Performed at [facility], [place] [address], [place], [place] [address]    Special Requests      BOTTLES DRAWN AEROBIC AND ANAEROBIC Blood Culture adequate volume Performed at [place] [address] [place], [place] [address]    Culture      NO GROWTH < 12 HOURS Performed at [place] [address] [place], [place] [address]    Report Status PENDING   Urinalysis, Routine w reflex microscopic     Status: Abnormal   Collection Time: 07/21/19 10:21 PM  Result Value Ref Range   Color, Urine AMBER (A) YELLOW    Comment: BIOCHEMICALS MAY BE AFFECTED BY COLOR   APPearance CLOUDY (A) CLEAR   Specific Gravity, Urine 1.016 1.005 - 1.030   pH 5.0 5.0 - 8.0   Glucose, UA NEGATIVE NEGATIVE mg/dL   Hgb urine dipstick NEGATIVE NEGATIVE   Bilirubin Urine NEGATIVE NEGATIVE   Ketones, ur NEGATIVE NEGATIVE mg/dL   Protein, ur >=300 (A) NEGATIVE mg/dL   Nitrite NEGATIVE NEGATIVE   Leukocytes,Ua SMALL (A) NEGATIVE   RBC / HPF 0-5 0 - 5 RBC/hpf   WBC, UA 21-50 0 - 5 WBC/hpf   Bacteria, UA MANY (A) NONE SEEN   Squamous Epithelial / LPF 0-5 0 - 5    Comment: Performed at [facility], [place] [address] [place], [place] [address]  POC SARS Coronavirus 2 Ag-ED - Nasal Swab (BD Veritor Kit)     Status: Abnormal   Collection Time: 07/21/19 10:56 PM  Result Value Ref Range   SARS Coronavirus 2 Ag POSITIVE (A) NEGATIVE    Comment: (NOTE) SARS-CoV-2 antigen PRESENT. Positive results indicate the presence of viral antigens, but clinical correlation with  patient history and other diagnostic information is necessary to determine patient infection status.  Positive results do not rule out bacterial infection or co-infection  with other viruses. False positive results are rare but can occur, and confirmatory RT-PCR testing may be appropriate in some circumstances. The expected result is Negative. Fact Sheet for Patients: PodPark.tn Fact Sheet for Providers: GiftContent.is  This test is not yet approved or cleared by the [place] FDA and  has been authorized for detection and/or diagnosis of SARS-CoV-2 by FDA under an Emergency Use Authorization (EUA).  This EUA will remain in effect (meaning this test can be used) for the duration of  the COVID-19 declaration under Section 564(b)(1) of the Act, 21 U.S.C. section 360bbb-3(b)(1), unless the a uthorization is terminated or revoked sooner.   ABO/Rh     Status: None   Collection Time: 07/22/19  7:00 AM  Result Value Ref Range   ABO/RH(D)      A POS Performed at [facility], [place] [address] [place],  [address]    DG Chest Port 1 View  Result Date: 07/21/2019 CLINICAL DATA:  Shortness of breath and fatigue. EXAM: PORTABLE CHEST 1 VIEW COMPARISON:  May 04, 2017 FINDINGS: There is no evidence of acute infiltrate, pleural effusion or pneumothorax. The cardiac silhouette is mildly enlarged. Multilevel degenerative changes seen throughout the thoracic spine. IMPRESSION: 1. No acute or active cardiopulmonary disease. Electronically Signed   By: Virgina Norfolk M.D.   On: 07/21/2019 22:22    Pending Labs Unresulted Labs (From admission, onward)    Start     Ordered   07/22/19 1532  Hemoglobin A1c  Add-on,   AD    Comments: To assess prior glycemic control    07/22/19 1531   07/21/19 2133  Blood Culture (routine x 2)  BLOOD CULTURE X 2,   STAT     07/21/19 2133   07/21/19 2133  Urine culture  ONCE -  STAT,   STAT     07/21/19 2133          Vitals/Pain Today's Vitals   07/22/19 1400 07/22/19 1415 07/22/19 1430 07/22/19 1525  BP: (!) 179/83  (!) 198/76 (!) 204/82  Pulse:   76   Resp: (!) 29 (!) 28 (!) 27 (!) 29  Temp:      TempSrc:      SpO2:   93%   PainSc:        Isolation Precautions Airborne and Contact precautions  Medications Medications  sodium chloride flush (NS) 0.9 % injection 3 mL (3 mLs Intravenous Given 07/22/19 1446)  sodium chloride flush (NS) 0.9 % injection 3 mL (has no administration in time range)  0.9 %  sodium chloride infusion (has no administration in time range)  ascorbic acid (VITAMIN C) tablet 500 mg (500 mg Oral Given 07/22/19 1444)  zinc sulfate capsule 220 mg (220 mg Oral Given 07/22/19 1443)  guaiFENesin-dextromethorphan (ROBITUSSIN DM) 100-10 MG/5ML syrup 10 mL (has no administration in time range)  chlorpheniramine-HYDROcodone (TUSSIONEX) 10-8 MG/5ML suspension 5 mL (has no administration in time range)  acetaminophen (TYLENOL) tablet 650 mg (has no administration in time range)  ondansetron (ZOFRAN) tablet 4 mg (has no administration in time range)    Or  ondansetron (ZOFRAN) injection 4 mg (has no administration in time range)  carvedilol (COREG) tablet 6.25 mg (6.25 mg Oral Given 07/22/19 1100)  amLODipine (NORVASC) tablet 10 mg (10 mg Oral Given 07/22/19 1444)  hydrALAZINE (APRESOLINE) injection 10 mg (has no administration in time range)  benzonatate (TESSALON) capsule 200 mg (has no administration in time range)  docusate sodium (COLACE) capsule 100 mg (has no administration in time range)  furosemide (LASIX) tablet 40 mg (has no administration in time range)  glipiZIDE (GLUCOTROL XL) 24 hr tablet 10 mg (has no administration in time range)  insulin glargine (LANTUS) injection 20 Units (has no administration in time range)  metFORMIN (GLUCOPHAGE-XR) 24 hr tablet 500 mg (has no administration in time range)  insulin aspart (novoLOG) injection  0-9 Units (has no administration in time range)  insulin aspart (novoLOG) injection 0-5 Units (has no administration in time range)  remdesivir 200 mg in sodium chloride 0.9% 250 mL IVPB (has no administration in time range)    Followed by  remdesivir 100 mg in sodium chloride 0.9 % 100 mL IVPB (has no administration in time range)  mirtazapine (REMERON) tablet 15 mg (has no administration in time range)    Mobility walks with device High fall risk   Focused Assessments    R Recommendations: See Admitting Provider Note  Report given to: Nevin Bloodgood RN   Additional Notes:

## 2019-07-22 NOTE — Progress Notes (Addendum)
PROGRESS NOTE  VAUDA MUZIO  DOB: 23-Oct-1930  PCP: [name], MD [id number]  DOA: 07/21/2019 Admitted From: Home  LOS: 0 days   Chief Complaint  Patient presents with  . COVID positive  . Shortness of Breath   Brief narrative: [name] is a 86 y.o. female with medical history significant of dementia, congestive heart failure, diabetes, hypertension sent to the ED in by family members on 1/7 due to progressive worsening weakness.  In the ED, patient's temperature 100.2, blood pressure in 190s, present comfortably on room air. COVID-19 positive. Chest x-ray was negative. She was unable to ambulate in ED and hence was referred for admission for physical therapy.  Subjective: Patient was seen and examined this morning.  Elderly Caucasian female.   Not in physical distress.  Not on oxygen supplementation.  Able to follow motor commands but does not seem to be oriented, cannot have a verbal conversation.  Assessment/Plan: COVID infection -Presented with fever, cough -Chest imaging -chest x-ray negative. -WBC count normal.  I do not see inflammatory markers checked. -On admission, she was not started on any Decadron or remdesivir. -Oxygen - SpO2: 93 % on room air.  However, looking at the trend of her oxygen saturation, patient seems to be tachypneic to maintain O2 sat barely over 90% on room air.  We will start the patient on oxygen supplementation as well as Decadron. -Although CT does not have pneumonia, she is not hypoxic but had fever, cough and generalized weakness.  I will start her on a course of IV remdesivir. -Antitussives for cough: Benzonatate and Robitussin-DM -Continue airborne/contact isolation precautions. -WBC and inflammatory markers trend as below.  Hypertensive urgency Chronic diastolic CHF -Patient seems to be having consistently elevated blood pressure over 190 in the ED. -Home meds include Amlodipine, Coreg, Lasix, potassium supplement.  All meds  resumed. -I called and asked the nurse to give her hydralazine IV for a reading of 0000000 systolic this afternoon.  Generalized weakness -Secondary to COVID  -PT eval ordered.  Diabetes mellitus -Home meds include Glipizide 10 daily, premeal sliding scale insulin, Lantus 20 at bedtime, Metformin 500 twice daily -Resume all.  -I ordered for insulin sliding scale with Accu-Cheks.  Dementia  -Supportive care. -Remeron as needed at night.  Mobility: Encourage ambulation.  PT eval ordered. Diet: Cardiac/diabetic diet Fluid: None DVT prophylaxis:  SCDs Code Status:  Full code Family Communication:  Called and updated patient's granddaughter Ms. [name] Expected Discharge:  Pending PT eval.  Consultants:    Procedures:    Antimicrobials: Anti-infectives (From admission, onward)   Start     Dose/Rate Route Frequency Ordered Stop   07/23/19 1000  remdesivir 100 mg in sodium chloride 0.9 % 100 mL IVPB     100 mg 200 mL/hr over 30 Minutes Intravenous Daily 07/22/19 1536 07/27/19 0959   07/22/19 1700  remdesivir 200 mg in sodium chloride 0.9% 250 mL IVPB     200 mg 580 mL/hr over 30 Minutes Intravenous Once 07/22/19 1536          Code Status: Full Code   Diet Order            Diet Heart Room service appropriate? Yes; Fluid consistency: Thin  Diet effective now              Infusions:  . sodium chloride    . remdesivir 200 mg in sodium chloride 0.9% 250 mL IVPB     Followed by  . [START  ON 07/23/2019] remdesivir 100 mg in NS 100 mL      Scheduled Meds: . amLODipine  10 mg Oral q morning - 10a  . vitamin C  500 mg Oral Daily  . benzonatate  200 mg Oral TID  . carvedilol  6.25 mg Oral BID WC  . dexamethasone (DECADRON) injection  6 mg Intravenous Q24H  . docusate sodium  100 mg Oral BID  . [START ON 07/23/2019] furosemide  40 mg Oral q morning - 10a  . [START ON 07/23/2019] glipiZIDE  10 mg Oral QAC breakfast  . insulin aspart  0-5 Units Subcutaneous QHS  . insulin  aspart  0-9 Units Subcutaneous TID WC  . insulin glargine  20 Units Subcutaneous QHS  . metFORMIN  500 mg Oral BID  . sodium chloride flush  3 mL Intravenous Q12H  . zinc sulfate  220 mg Oral Daily    PRN meds: sodium chloride, acetaminophen, chlorpheniramine-HYDROcodone, guaiFENesin-dextromethorphan, hydrALAZINE, mirtazapine, ondansetron **OR** ondansetron (ZOFRAN) IV, sodium chloride flush   Objective: Vitals:   07/22/19 1430 07/22/19 1525  BP: (!) 198/76 (!) 204/82  Pulse: 76   Resp: (!) 27 (!) 29  Temp:    SpO2: 93%     Intake/Output Summary (Last 24 hours) at 07/22/2019 1613 Last data filed at 07/22/2019 1248 Gross per 24 hour  Intake --  Output 500 ml  Net -500 ml   There were no vitals filed for this visit. Weight change:  There is no height or weight on file to calculate BMI.   Physical Exam: General exam: Appears calm and comfortable.  Skin: No rashes, lesions or ulcers. HEENT: Atraumatic, normocephalic, supple neck, no obvious bleeding Lungs: Clear to auscultate bilaterally CVS: Regular rate and rhythm, no murmur GI/Abd soft, nontender, nondistended, ostomy present CNS: Alert, awake, able to follow multiple commands, unable to answer questions Psychiatry: mood appropriate Extremities: No pedal edema, no calf tenderness  Data Review: I have personally reviewed the laboratory data and studies available.  Recent Labs  Lab 07/21/19 2220  WBC 6.3  NEUTROABS 3.8  HGB 14.3  HCT 43.3  MCV 88.2  PLT 211   Recent Labs  Lab 07/21/19 2220  NA 136  K 4.1  CL 99  CO2 26  GLUCOSE 289*  BUN 24*  CREATININE 1.02*  CALCIUM 8.7*    Terrilee Croak, MD  [facility] 07/22/2019

## 2019-07-22 NOTE — Plan of Care (Signed)
POC initiated 

## 2019-07-22 NOTE — Evaluation (Signed)
Physical Therapy Evaluation Patient Details Name: [name] MRN: [id number] DOB: [birth year] Today's Date: 07/22/2019   History of Present Illness  Pt admitted 2* weakness and dx with COVID; pt with hx of DM, CHF and dementia  Clinical Impression  Pt admitted as above and presenting with functional mobility limitations 2* generalized weakness, balance deficits and dementia related cognitive deficits.  Pt currently requiring significant assist for performance of all basic mobility tasks and would benefit from follow up rehab at SNF level to maximize IND and safety.    Follow Up Recommendations SNF    Equipment Recommendations  None recommended by PT    Recommendations for Other Services       Precautions / Restrictions Precautions Precautions: Fall Restrictions Weight Bearing Restrictions: No      Mobility  Bed Mobility Overal bed mobility: Needs Assistance Bed Mobility: Supine to Sit;Sit to Supine     Supine to sit: Mod assist;+2 for physical assistance;+2 for safety/equipment Sit to supine: Mod assist;+2 for physical assistance;+2 for safety/equipment   General bed mobility comments: assist to manage LEs and to control trunk  Transfers Overall transfer level: Needs assistance Equipment used: Rolling walker (2 wheeled) Transfers: Sit to/from Stand Sit to Stand: Mod assist;+2 physical assistance;+2 safety/equipment;From elevated surface         General transfer comment: assist to bring wt up and fwd and to balance in standing  Ambulation/Gait             General Gait Details: pt stood only with legs against ICU gurney and with UEs on RW and unable to advance either LE fwd or to the side  Stairs            Wheelchair Mobility    Modified Rankin (Stroke Patients Only)       Balance Overall balance assessment: Needs assistance Sitting-balance support: Bilateral upper extremity supported;Feet unsupported Sitting balance-Leahy Scale: Poor Sitting  balance - Comments: posterior drift   Standing balance support: Bilateral upper extremity supported Standing balance-Leahy Scale: Poor                               Pertinent Vitals/Pain Pain Assessment: No/denies pain    Home Living Family/patient expects to be discharged to:: Unsure                      Prior Function Level of Independence: Needs assistance   Gait / Transfers Assistance Needed: Pt states she uses walker but her granddaughter is trying to take it away from her  ADL's / Homemaking Assistance Needed: assist of family  Comments: Pt is not reliable historian     Hand Dominance        Extremity/Trunk Assessment   Upper Extremity Assessment Upper Extremity Assessment: RUE deficits/detail;Generalized weakness RUE Deficits / Details: Pt states ltd use of that arm and then starts talking about her grandson and football    Lower Extremity Assessment Lower Extremity Assessment: Generalized weakness       Communication   Communication: HOH  Cognition Arousal/Alertness: Awake/alert Behavior During Therapy: WFL for tasks assessed/performed Overall Cognitive Status: History of cognitive impairments - at baseline                                 General Comments: Pt following most simple cues but with some repetition required - ?HOH  General Comments      Exercises     Assessment/Plan    PT Assessment Patient needs continued PT services  PT Problem List Decreased strength;Decreased activity tolerance;Decreased balance;Decreased mobility;Decreased cognition;Decreased knowledge of use of DME       PT Treatment Interventions DME instruction;Gait training;Functional mobility training;Therapeutic activities;Therapeutic exercise;Patient/family education    PT Goals (Current goals can be found in the Care Plan section)  Acute Rehab PT Goals Patient Stated Goal: No goals expressed PT Goal Formulation: With patient Time  For Goal Achievement: 07/29/19 Potential to Achieve Goals: Good    Frequency Min 3X/week   Barriers to discharge        Co-evaluation               AM-PAC PT "6 Clicks" Mobility  Outcome Measure Help needed turning from your back to your side while in a flat bed without using bedrails?: A Little Help needed moving from lying on your back to sitting on the side of a flat bed without using bedrails?: A Lot Help needed moving to and from a bed to a chair (including a wheelchair)?: A Lot Help needed standing up from a chair using your arms (e.g., wheelchair or bedside chair)?: A Lot Help needed to walk in hospital room?: Total Help needed climbing 3-5 steps with a railing? : Total 6 Click Score: 11    End of Session Equipment Utilized During Treatment: Gait belt Activity Tolerance: Patient limited by fatigue Patient left: in bed;with call bell/phone within reach Nurse Communication: Mobility status PT Visit Diagnosis: Muscle weakness (generalized) (M62.81);Difficulty in walking, not elsewhere classified (R26.2)    Time: 1035-1100 PT Time Calculation (min) (ACUTE ONLY): 25 min   Charges:   PT Evaluation $PT Eval Low Complexity: 1 Low          [facility] Pager [phone] Office [phone]   [facility] 07/22/2019, 4:37 PM

## 2019-07-22 NOTE — ED Notes (Signed)
Attempted to assist patient with standing and ambulation, but patient unable to bare weight at this time. Patient reports that she is normally ambulatory at home without assistance but her granddaughter has been making her stay in bed due to illness and falls. From lying to sitting position, patient tachypneic and short of breath with a decrease in SpO2 to 92%. SpO2 increased to 94% once repositioned in bed and encouraged deep breathing. EDP made aware.

## 2019-07-22 NOTE — ED Notes (Signed)
Pt. Documented in error see above note in chart. 

## 2019-07-23 ENCOUNTER — Other Ambulatory Visit: Payer: Self-pay

## 2019-07-23 ENCOUNTER — Encounter (HOSPITAL_COMMUNITY): Payer: Self-pay | Admitting: Family Medicine

## 2019-07-23 DIAGNOSIS — G4733 Obstructive sleep apnea (adult) (pediatric): Secondary | ICD-10-CM | POA: Diagnosis present

## 2019-07-23 DIAGNOSIS — Z823 Family history of stroke: Secondary | ICD-10-CM | POA: Diagnosis not present

## 2019-07-23 DIAGNOSIS — H9190 Unspecified hearing loss, unspecified ear: Secondary | ICD-10-CM | POA: Diagnosis present

## 2019-07-23 DIAGNOSIS — Z883 Allergy status to other anti-infective agents status: Secondary | ICD-10-CM | POA: Diagnosis not present

## 2019-07-23 DIAGNOSIS — Z791 Long term (current) use of non-steroidal anti-inflammatories (NSAID): Secondary | ICD-10-CM | POA: Diagnosis not present

## 2019-07-23 DIAGNOSIS — Z794 Long term (current) use of insulin: Secondary | ICD-10-CM | POA: Diagnosis not present

## 2019-07-23 DIAGNOSIS — Z85038 Personal history of other malignant neoplasm of large intestine: Secondary | ICD-10-CM | POA: Diagnosis not present

## 2019-07-23 DIAGNOSIS — F419 Anxiety disorder, unspecified: Secondary | ICD-10-CM | POA: Diagnosis present

## 2019-07-23 DIAGNOSIS — F05 Delirium due to known physiological condition: Secondary | ICD-10-CM | POA: Diagnosis not present

## 2019-07-23 DIAGNOSIS — E1165 Type 2 diabetes mellitus with hyperglycemia: Secondary | ICD-10-CM | POA: Diagnosis not present

## 2019-07-23 DIAGNOSIS — N390 Urinary tract infection, site not specified: Secondary | ICD-10-CM | POA: Diagnosis present

## 2019-07-23 DIAGNOSIS — M1990 Unspecified osteoarthritis, unspecified site: Secondary | ICD-10-CM | POA: Diagnosis present

## 2019-07-23 DIAGNOSIS — Z888 Allergy status to other drugs, medicaments and biological substances status: Secondary | ICD-10-CM | POA: Diagnosis not present

## 2019-07-23 DIAGNOSIS — U071 COVID-19: Secondary | ICD-10-CM | POA: Diagnosis present

## 2019-07-23 DIAGNOSIS — B9620 Unspecified Escherichia coli [E. coli] as the cause of diseases classified elsewhere: Secondary | ICD-10-CM | POA: Diagnosis present

## 2019-07-23 DIAGNOSIS — R0602 Shortness of breath: Secondary | ICD-10-CM | POA: Diagnosis not present

## 2019-07-23 DIAGNOSIS — I10 Essential (primary) hypertension: Secondary | ICD-10-CM | POA: Diagnosis not present

## 2019-07-23 DIAGNOSIS — I160 Hypertensive urgency: Secondary | ICD-10-CM | POA: Diagnosis present

## 2019-07-23 DIAGNOSIS — F0390 Unspecified dementia without behavioral disturbance: Secondary | ICD-10-CM | POA: Diagnosis present

## 2019-07-23 DIAGNOSIS — I5032 Chronic diastolic (congestive) heart failure: Secondary | ICD-10-CM | POA: Diagnosis present

## 2019-07-23 DIAGNOSIS — I110 Hypertensive heart disease with heart failure: Secondary | ICD-10-CM | POA: Diagnosis present

## 2019-07-23 DIAGNOSIS — Z90710 Acquired absence of both cervix and uterus: Secondary | ICD-10-CM | POA: Diagnosis not present

## 2019-07-23 DIAGNOSIS — Z79899 Other long term (current) drug therapy: Secondary | ICD-10-CM | POA: Diagnosis not present

## 2019-07-23 DIAGNOSIS — J1282 Pneumonia due to coronavirus disease 2019: Secondary | ICD-10-CM | POA: Diagnosis not present

## 2019-07-23 DIAGNOSIS — Z833 Family history of diabetes mellitus: Secondary | ICD-10-CM | POA: Diagnosis not present

## 2019-07-23 DIAGNOSIS — Z9049 Acquired absence of other specified parts of digestive tract: Secondary | ICD-10-CM | POA: Diagnosis not present

## 2019-07-23 LAB — CBC WITH DIFFERENTIAL/PLATELET
Abs Immature Granulocytes: 0.03 10*3/uL (ref 0.00–0.07)
Abs Immature Granulocytes: 0.02 10*3/uL (ref 0.00–0.07)
Basophils Absolute: 0 10*3/uL (ref 0.0–0.1)
Basophils Absolute: 0 10*3/uL (ref 0.0–0.1)
Basophils Relative: 0 %
Basophils Relative: 0 %
Eosinophils Absolute: 0 10*3/uL (ref 0.0–0.5)
Eosinophils Absolute: 0 10*3/uL (ref 0.0–0.5)
Eosinophils Relative: 0 %
Eosinophils Relative: 0 %
HCT: 44.8 % (ref 36.0–46.0)
HCT: 42.9 % (ref 36.0–46.0)
Hemoglobin: 14.5 g/dL (ref 12.0–15.0)
Hemoglobin: 14.1 g/dL (ref 12.0–15.0)
Immature Granulocytes: 0 %
Immature Granulocytes: 0 %
Lymphocytes Relative: 13 %
Lymphocytes Relative: 16 %
Lymphs Abs: 1.1 10*3/uL (ref 0.7–4.0)
Lymphs Abs: 1.1 10*3/uL (ref 0.7–4.0)
MCH: 29.2 pg (ref 26.0–34.0)
MCH: 28.8 pg (ref 26.0–34.0)
MCHC: 32.4 g/dL (ref 30.0–36.0)
MCHC: 32.9 g/dL (ref 30.0–36.0)
MCV: 90.1 fL (ref 80.0–100.0)
MCV: 87.7 fL (ref 80.0–100.0)
Monocytes Absolute: 0.4 10*3/uL (ref 0.1–1.0)
Monocytes Absolute: 0.6 10*3/uL (ref 0.1–1.0)
Monocytes Relative: 6 %
Monocytes Relative: 8 %
Neutro Abs: 6.5 10*3/uL (ref 1.7–7.7)
Neutro Abs: 5.3 10*3/uL (ref 1.7–7.7)
Neutrophils Relative %: 81 %
Neutrophils Relative %: 76 %
Platelets: 214 10*3/uL (ref 150–400)
Platelets: 211 10*3/uL (ref 150–400)
RBC: 4.97 MIL/uL (ref 3.87–5.11)
RBC: 4.89 MIL/uL (ref 3.87–5.11)
RDW: 13.8 % (ref 11.5–15.5)
RDW: 13.3 % (ref 11.5–15.5)
WBC: 8.1 10*3/uL (ref 4.0–10.5)
WBC: 7 10*3/uL (ref 4.0–10.5)
nRBC: 0 % (ref 0.0–0.2)
nRBC: 0 % (ref 0.0–0.2)

## 2019-07-23 LAB — COMPREHENSIVE METABOLIC PANEL
ALT: 28 U/L (ref 0–44)
AST: 39 U/L (ref 15–41)
Albumin: 3 g/dL — ABNORMAL LOW (ref 3.5–5.0)
Alkaline Phosphatase: 65 U/L (ref 38–126)
Anion gap: 14 (ref 5–15)
BUN: 41 mg/dL — ABNORMAL HIGH (ref 8–23)
CO2: 18 mmol/L — ABNORMAL LOW (ref 22–32)
Calcium: 8 mg/dL — ABNORMAL LOW (ref 8.9–10.3)
Chloride: 100 mmol/L (ref 98–111)
Creatinine, Ser: 1.37 mg/dL — ABNORMAL HIGH (ref 0.44–1.00)
GFR calc Af Amer: 40 mL/min — ABNORMAL LOW (ref >60)
GFR calc non Af Amer: 35 mL/min — ABNORMAL LOW (ref >60)
Glucose, Bld: 599 mg/dL (ref 70–99)
Potassium: 4.2 mmol/L (ref 3.5–5.1)
Sodium: 132 mmol/L — ABNORMAL LOW (ref 135–145)
Total Bilirubin: 0.6 mg/dL (ref 0.3–1.2)
Total Protein: 7.5 g/dL (ref 6.5–8.1)

## 2019-07-23 LAB — GLUCOSE, CAPILLARY
Glucose-Capillary: 350 mg/dL — ABNORMAL HIGH (ref 70–99)
Glucose-Capillary: 544 mg/dL (ref 70–99)
Glucose-Capillary: >600 mg/dL (ref 70–99)
Glucose-Capillary: 574 mg/dL (ref 70–99)
Glucose-Capillary: 477 mg/dL — ABNORMAL HIGH (ref 70–99)
Glucose-Capillary: 425 mg/dL — ABNORMAL HIGH (ref 70–99)
Glucose-Capillary: 374 mg/dL — ABNORMAL HIGH (ref 70–99)
Glucose-Capillary: 247 mg/dL — ABNORMAL HIGH (ref 70–99)

## 2019-07-23 LAB — BASIC METABOLIC PANEL
Anion gap: 16 — ABNORMAL HIGH (ref 5–15)
BUN: 45 mg/dL — ABNORMAL HIGH (ref 8–23)
CO2: 19 mmol/L — ABNORMAL LOW (ref 22–32)
Calcium: 8 mg/dL — ABNORMAL LOW (ref 8.9–10.3)
Chloride: 95 mmol/L — ABNORMAL LOW (ref 98–111)
Creatinine, Ser: 1.31 mg/dL — ABNORMAL HIGH (ref 0.44–1.00)
GFR calc Af Amer: 43 mL/min — ABNORMAL LOW (ref >60)
GFR calc non Af Amer: 37 mL/min — ABNORMAL LOW (ref >60)
Glucose, Bld: 661 mg/dL (ref 70–99)
Potassium: 4.8 mmol/L (ref 3.5–5.1)
Sodium: 130 mmol/L — ABNORMAL LOW (ref 135–145)

## 2019-07-23 LAB — BLOOD GAS, ARTERIAL
Acid-base deficit: 3.7 mmol/L — ABNORMAL HIGH (ref 0.0–2.0)
Bicarbonate: 20.2 mmol/L (ref 20.0–28.0)
FIO2: 21
O2 Saturation: 93.4 %
Patient temperature: 98.6
pCO2 arterial: 35.2 mmHg (ref 32.0–48.0)
pH, Arterial: 7.378 (ref 7.350–7.450)
pO2, Arterial: 71.9 mmHg — ABNORMAL LOW (ref 83.0–108.0)

## 2019-07-23 LAB — BETA-HYDROXYBUTYRIC ACID: Beta-Hydroxybutyric Acid: 0.13 mmol/L (ref 0.05–0.27)

## 2019-07-23 LAB — HEMOGLOBIN A1C
Hgb A1c MFr Bld: 9.2 % — ABNORMAL HIGH (ref 4.8–5.6)
Mean Plasma Glucose: 217.34 mg/dL

## 2019-07-23 LAB — CREATININE, SERUM
Creatinine, Ser: 1.15 mg/dL — ABNORMAL HIGH (ref 0.44–1.00)
GFR calc Af Amer: 50 mL/min — ABNORMAL LOW (ref >60)
GFR calc non Af Amer: 43 mL/min — ABNORMAL LOW (ref >60)

## 2019-07-23 LAB — PHOSPHORUS: Phosphorus: 2.2 mg/dL — ABNORMAL LOW (ref 2.5–4.6)

## 2019-07-23 MED ORDER — CHLORHEXIDINE GLUCONATE CLOTH 2 % EX PADS
6.0000 | MEDICATED_PAD | Freq: Every day | CUTANEOUS | Status: DC
  Administered 2019-07-23 – 2019-07-25 (×3): 6 via TOPICAL

## 2019-07-23 MED ORDER — DEXTROSE 50 % IV SOLN: 0.0000 mL | INTRAVENOUS | Status: DC | PRN

## 2019-07-23 MED ORDER — SODIUM CHLORIDE 0.9 % IV SOLN: INTRAVENOUS | Status: DC

## 2019-07-23 MED ORDER — ENOXAPARIN SODIUM 40 MG/0.4ML ~~LOC~~ SOLN
40.0000 mg | SUBCUTANEOUS | Status: DC
  Administered 2019-07-23 – 2019-07-28 (×4): 40 mg via SUBCUTANEOUS
  Filled 2019-07-23 (×5): qty 0.4

## 2019-07-23 MED ORDER — SODIUM CHLORIDE 0.9 % IV BOLUS
500.0000 mL | Freq: Once | INTRAVENOUS | Status: AC
  Administered 2019-07-23: 500 mL via INTRAVENOUS

## 2019-07-23 MED ORDER — DEXTROSE-NACL 5-0.45 % IV SOLN: INTRAVENOUS | Status: DC

## 2019-07-23 MED ORDER — SODIUM CHLORIDE 0.9 % IV SOLN
1.0000 g | INTRAVENOUS | Status: DC
  Administered 2019-07-23 – 2019-07-24 (×2): 1 g via INTRAVENOUS
  Filled 2019-07-23: qty 1
  Filled 2019-07-23: qty 10
  Filled 2019-07-23: qty 1

## 2019-07-23 MED ORDER — INSULIN DETEMIR 100 UNIT/ML ~~LOC~~ SOLN: 0.0750 [IU]/kg | Freq: Two times a day (BID) | SUBCUTANEOUS | Status: DC

## 2019-07-23 MED ORDER — INSULIN ASPART 100 UNIT/ML ~~LOC~~ SOLN
0.0000 [IU] | Freq: Three times a day (TID) | SUBCUTANEOUS | Status: DC
  Administered 2019-07-24: 5 [IU] via SUBCUTANEOUS
  Administered 2019-07-24: 3 [IU] via SUBCUTANEOUS
  Administered 2019-07-25: 11 [IU] via SUBCUTANEOUS
  Administered 2019-07-25 (×2): 3 [IU] via SUBCUTANEOUS
  Administered 2019-07-26: 2 [IU] via SUBCUTANEOUS
  Administered 2019-07-26: 8 [IU] via SUBCUTANEOUS
  Administered 2019-07-28: 5 [IU] via SUBCUTANEOUS
  Administered 2019-07-29: 2 [IU] via SUBCUTANEOUS

## 2019-07-23 MED ORDER — ALPRAZOLAM 0.5 MG PO TABS
0.5000 mg | ORAL_TABLET | Freq: Two times a day (BID) | ORAL | Status: DC
  Administered 2019-07-23 – 2019-07-29 (×9): 0.5 mg via ORAL
  Filled 2019-07-23 (×10): qty 1

## 2019-07-23 MED ORDER — INSULIN ASPART 100 UNIT/ML ~~LOC~~ SOLN
20.0000 [IU] | Freq: Once | SUBCUTANEOUS | Status: AC
  Administered 2019-07-23: 20 [IU] via SUBCUTANEOUS

## 2019-07-23 MED ORDER — INSULIN ASPART 100 UNIT/ML ~~LOC~~ SOLN
13.0000 [IU] | Freq: Once | SUBCUTANEOUS | Status: AC
  Administered 2019-07-23: 20 [IU] via SUBCUTANEOUS

## 2019-07-23 MED ORDER — INSULIN REGULAR(HUMAN) IN NACL 100-0.9 UT/100ML-% IV SOLN
INTRAVENOUS | Status: DC
  Administered 2019-07-23: 16 [IU]/h via INTRAVENOUS
  Filled 2019-07-23 (×3): qty 100

## 2019-07-23 NOTE — Progress Notes (Addendum)
FSBS 574 upon recheck, 1hr after 20 units novolog and 500 ml bolus as instructed. Kyung Bacca, MD aware. Pt to be transferred to stepdown for insulin gtt and closer blood sugar monitoring.

## 2019-07-23 NOTE — Progress Notes (Signed)
FSBS 544. Kyung Bacca, Md notified. Insulin orders to be changed.

## 2019-07-23 NOTE — Progress Notes (Signed)
Glucose 599 in labs. Kyung Bacca, MD aware. New insulin orders placed.

## 2019-07-23 NOTE — Progress Notes (Signed)
Urine culture positive for bacteria. Kyung Bacca, MD notified.

## 2019-07-23 NOTE — Progress Notes (Signed)
Inpatient Diabetes Program Recommendations  AACE/ADA: New Consensus Statement on Inpatient Glycemic Control (2015)  Target Ranges:  Prepandial:   less than 140 mg/dL      Peak postprandial:   less than 180 mg/dL (1-2 hours)      Critically ill patients:  140 - 180 mg/dL   Lab Results  Component Value Date   GLUCAP 574 (HH) 07/23/2019   HGBA1C 9.2 (H) 07/23/2019    Review of Glycemic Control Results for [name], [name] (MRN [id number]) as of 07/23/2019 17:42  Ref. Range 07/22/2019 21:40 07/23/2019 07:35 07/23/2019 11:36 07/23/2019 15:50 07/23/2019 17:16  Glucose-Capillary Latest Ref Range: 70 - 99 mg/dL 250 (H) 350 (H) 544 (HH) >600 (HH) 574 (HH)   Diabetes history: DM 2 Outpatient Diabetes medications:  Lantus 65 units daily, Humalog 0-15 units tid with meals, Metformin 500 mg bid Current orders for Inpatient glycemic control:  IV insulin/Hyperglycemia mode Decadron 6 mg daily Lantus 20 units q HS Glipizide XL 10 mg daily Metformin 500 mg bid Inpatient Diabetes Program Recommendations:    Agree with orders.  Patient has received 2 doses of Novolog 20 units and blood sugars continue to be >500 mg/dL.  To transfer to stepdown for IV insulin.  Consider holding Lantus and Glipizide while on IV insulin.   Thanks,  Adah Perl, RN, BC-ADM Inpatient Diabetes Coordinator Pager [phone] (8a-5p)

## 2019-07-23 NOTE — Progress Notes (Signed)
PROGRESS NOTE  [name] [id number] DOB: 05/29/1931 DOA: 07/21/2019 PCP: [name], MD  HPI/Recap of past 20 hours:  86 year old white female with past medical history significant for dementia congestive heart failure, diabetes, hypertension, and anxiety.  Patient was admitted 01/31/2020 with a temperature of 100.2 blood pressure in the 190s due to progressive generalized weakness she was Covid 19 positive.  Her chest x-ray however was negative for pneumonia she was noted to be unable to ambulate in the ED and hence was referred for admission and for physical therapy.   Subjective: Seen and examined at bedside.  Patient was noted to have tremors of her hands.  When I asked her to start demanding her nurse.  Nurse reported sugar was elevated.  And glucose she will keep weight going up and went as high as 600 over already [place] on the glucometer.  She received several coverage of insulin subcu.  Blood sugar is still uncontrolled so she will be transferred to stepdown for IV insulin.  She is still very weak.  Physical therapy recommends SNF.  I mentioned to her about possibly going to the nursing home she was all upset and crying that she does not want to go to nursing home.  I called her daughter and discussed this with her daughter granddaughter [name] and she is okay with patient coming home with physical therapy when she is ready to be discharged.  Also her daughter stated that she is usually on Xanax 0.5 mg 3 times a day at home which was not started since she has been in the hospital this might also be contributing to her shakiness or tremor.  So I will restart her Xanax but will reduce it to 0.5 milligrams 2 times daily.  Patient was started on remdesivir today  Assessment/Plan: Principal Problem:   COVID-19 Active Problems:   Essential hypertension   Weakness   Chronic diastolic CHF (congestive heart failure) (HCC)   1.  COVID-19 infection Chest x-ray was negative she does  not have any more cough or fever she is saturating well. Patient was started on remdesivir today  2.  Generalized weakness PT evaluation was done and they recommend SNF but patient does not want to go to SNF I spoke with her daughter about it they will be okay with discharging her home with home PT.  3.  Chronic diastolic congestive heart failure continue amlodipine Coreg Lasix potassium supplements  4.  Diabetes mellitus uncontrolled with hyperglycemia.  Patient suddenly started to have elevated blood sugars despite sliding scale coverage.  She is being transferred to stepdown for BiV insulin therapy.  5.  Hypertensive urgency resolved  6.  Urinary tract infection patient has been started on ceftriaxone this urinary tract infection might be exacerbating and causing the uncontrolled blood sugar levels.  7.  Dementia.  Patient is still conversant and able to understand some information because she became very upset when I try to discuss with her about possibly going to nursing home for rehab Code Status: Full full  Severity of Illness: The appropriate patient status for this patient is INPATIENT. Inpatient status is judged to be reasonable and necessary in order to provide the required intensity of service to ensure the patient's safety. The patient's presenting symptoms, physical exam findings, and initial radiographic and laboratory data in the context of their chronic comorbidities is felt to place them at high risk for further clinical deterioration. Furthermore, it is not anticipated that the patient will be medically  stable for discharge from the hospital within 2 midnights of admission. The following factors support the patient status of inpatient.   " The patient's presenting symptoms include Covid, urinary tract infection, hyperglycemia requiring IV insulin. " The worrisome physical exam findings include hyperglycemia with tremor requiring IV insulin. " The initial radiographic and  laboratory data are worrisome because of hyperglycemia Covid requiring IV insulin. " The chronic co-morbidities include unsure elderly.   * I certify that at the point of admission it is my clinical judgment that the patient will require inpatient hospital care spanning beyond 2 midnights from the point of admission due to high intensity of service, high risk for further deterioration and high frequency of surveillance required.*    Family Communication: [name] granddaughter who is primary caregiver discussed possible SNF patient had refused refused SNF.  [name] is willing to have her back home with physical therapy  Disposition Plan: Back home to granddaughter with home PT   Consultants:  None  Procedures:  None  Antimicrobials:  Ceftriaxone  DVT prophylaxis: Lovenox   Objective: Vitals:   07/22/19 1916 07/22/19 2131 07/23/19 0649 07/23/19 0900  BP: (!) 188/70 (!) 154/62 (!) 143/72 (!) 150/64  Pulse:  69 65 73  Resp:  (!) 24 (!) 22 (!) 22  Temp:  98.5 F (36.9 C) 97.7 F (36.5 C) 97.8 F (36.6 C)  TempSrc:  Oral Oral Oral  SpO2: 94% 96% 94% 97%  Weight:    84.4 kg  Height:    5\' 5"  (1.651 m)    Intake/Output Summary (Last 24 hours) at 07/23/2019 0948 Last data filed at 07/23/2019 0800 Gross per 24 hour  Intake 471.15 ml  Output 950 ml  Net -478.85 ml   Filed Weights   07/23/19 0900  Weight: 84.4 kg   Body mass index is 30.95 kg/m.  Exam:  . General: 86 y.o. year-old female well developed well nourished in no acute distress.  Alert and oriented x3. . Cardiovascular: Regular rate and rhythm with no rubs or gallops.  No thyromegaly or JVD noted.   Marland Kitchen Respiratory: Clear to auscultation with no wheezes or rales. Good inspiratory effort. . Abdomen: Soft nontender nondistended with normal bowel sounds x4 quadrants. . Musculoskeletal: No lower extremity edema. 2/4 pulses in all 4 extremities. . Skin: No ulcerative lesions noted or rashes, . Psychiatry: Mood is  appropriate for condition and setting patient is tremulous . Neurological: Patient was tremulous.  She was able to carry out a conversation    Data Reviewed: CBC: Recent Labs  Lab 07/21/19 2220  WBC 6.3  NEUTROABS 3.8  HGB 14.3  HCT 43.3  MCV 88.2  PLT 123456   Basic Metabolic Panel: Recent Labs  Lab 07/21/19 2220  NA 136  K 4.1  CL 99  CO2 26  GLUCOSE 289*  BUN 24*  CREATININE 1.02*  CALCIUM 8.7*   GFR: Estimated Creatinine Clearance: 42.5 mL/min (A) (by C-G formula based on SCr of 1.02 mg/dL (H)). Liver Function Tests: Recent Labs  Lab 07/21/19 2220  AST 22  ALT 23  ALKPHOS 72  BILITOT 0.8  PROT 8.0  ALBUMIN 3.5   No results for input(s): LIPASE, AMYLASE in the last 168 hours. No results for input(s): AMMONIA in the last 168 hours. Coagulation Profile: Recent Labs  Lab 07/21/19 2220  INR 1.0   Cardiac Enzymes: No results for input(s): CKTOTAL, CKMB, CKMBINDEX, TROPONINI in the last 168 hours. BNP (last 3 results) No results for input(s): PROBNP in  the last 8760 hours. HbA1C: Recent Labs    07/23/19 0101  HGBA1C 9.2*   CBG: Recent Labs  Lab 07/22/19 1637 07/22/19 1833 07/22/19 2140 07/23/19 0735  GLUCAP 258* 253* 250* 350*   Lipid Profile: No results for input(s): CHOL, HDL, LDLCALC, TRIG, CHOLHDL, LDLDIRECT in the last 72 hours. Thyroid Function Tests: No results for input(s): TSH, T4TOTAL, FREET4, T3FREE, THYROIDAB in the last 72 hours. Anemia Panel: No results for input(s): VITAMINB12, FOLATE, FERRITIN, TIBC, IRON, RETICCTPCT in the last 72 hours. Urine analysis:    Component Value Date/Time   COLORURINE AMBER (A) 07/21/2019 2221   APPEARANCEUR CLOUDY (A) 07/21/2019 2221   LABSPEC 1.016 07/21/2019 2221   PHURINE 5.0 07/21/2019 2221   GLUCOSEU NEGATIVE 07/21/2019 2221   HGBUR NEGATIVE 07/21/2019 2221   BILIRUBINUR NEGATIVE 07/21/2019 2221   KETONESUR NEGATIVE 07/21/2019 2221   PROTEINUR >=300 (A) 07/21/2019 2221   UROBILINOGEN  0.2 08/16/2010 2011   NITRITE NEGATIVE 07/21/2019 2221   LEUKOCYTESUR SMALL (A) 07/21/2019 2221   Sepsis Labs: @LABRCNTIP (procalcitonin:4,lacticidven:4)  ) Recent Results (from the past 240 hour(s))  Blood Culture (routine x 2)     Status: None (Preliminary result)   Collection Time: 07/21/19 10:21 PM   Specimen: BLOOD  Result Value Ref Range Status   Specimen Description   Final    BLOOD SITE NOT SPECIFIED Performed at [facility] [address] [place], [place] [address]    Special Requests   Final    BOTTLES DRAWN AEROBIC AND ANAEROBIC Blood Culture adequate volume Performed at [facility] [address], [place], [place] [address]    Culture   Final    NO GROWTH 1 DAY Performed at [facility], [place] [address], [place], [place] [address]    Report Status PENDING  Incomplete      Studies: No results found.  Scheduled Meds: . amLODipine  10 mg Oral q morning - 10a  . vitamin C  500 mg Oral Daily  . benzonatate  200 mg Oral TID  . carvedilol  6.25 mg Oral BID WC  . dexamethasone (DECADRON) injection  6 mg Intravenous Daily  . docusate sodium  100 mg Oral BID  . furosemide  40 mg Oral q morning - 10a  . glipiZIDE  10 mg Oral QAC breakfast  . insulin aspart  0-5 Units Subcutaneous QHS  . insulin aspart  0-9 Units Subcutaneous TID WC  . insulin glargine  20 Units Subcutaneous QHS  . metFORMIN  500 mg Oral BID WC  . sodium chloride flush  3 mL Intravenous Q12H  . zinc sulfate  220 mg Oral Daily    Continuous Infusions: . sodium chloride 250 mL (07/22/19 1925)  . remdesivir 100 mg in NS 100 mL 100 mg (07/23/19 0925)     LOS: 0 days     Cristal Deer, MD [facility]  To reach me or the doctor on call, go to: [email or web address] Password [email or web address]  07/23/2019, 9:48 AM

## 2019-07-23 NOTE — Progress Notes (Signed)
20units Novolog and 574ml NS bolus given as ordered for hyperglycemia. BMP completed. Will recheck FSBS in 1 hr.

## 2019-07-23 NOTE — Progress Notes (Signed)
Physical Therapy Treatment Patient Details Name: [name] MRN: [id number] DOB: 1930/11/07 Today's Date: 07/23/2019    History of Present Illness Pt admitted 2* weakness and dx with COVID; pt with hx of DM, CHF and dementia    PT Comments    Pt continues impulsive, with poor safety awareness, and at high risk of falls but with noted marked improvement in activity tolerance.  Pt able to ambulate in hall with cues to pause and catch breath - SaO2 maintained above 92% on RA.   Follow Up Recommendations  SNF     Equipment Recommendations  None recommended by PT    Recommendations for Other Services       Precautions / Restrictions Precautions Precautions: Fall Restrictions Weight Bearing Restrictions: No    Mobility  Bed Mobility               General bed mobility comments: Pt on BSC on arrival and up in chair at end of session  Transfers Overall transfer level: Needs assistance Equipment used: Rolling walker (2 wheeled) Transfers: Sit to/from Stand Sit to Stand: Mod assist;+2 safety/equipment;From elevated surface         General transfer comment: assist to bring wt up and fwd and to balance in standing  Ambulation/Gait Ambulation/Gait assistance: Min assist;Mod assist;+2 safety/equipment Gait Distance (Feet): 55 Feet Assistive device: Rolling walker (2 wheeled) Gait Pattern/deviations: Step-through pattern;Decreased step length - right;Decreased step length - left;Shuffle;Trunk flexed     General Gait Details: Poor safety awareness, cues to pause to recover from SOB (increased WOB but sats maintained above 92% on RA);   Cues for posture, position from RW and safety awareness   Stairs             Wheelchair Mobility    Modified Rankin (Stroke Patients Only)       Balance Overall balance assessment: Needs assistance Sitting-balance support: Bilateral upper extremity supported;Feet unsupported Sitting balance-Leahy Scale: Fair      Standing balance support: Bilateral upper extremity supported Standing balance-Leahy Scale: Poor                              Cognition Arousal/Alertness: Awake/alert Behavior During Therapy: WFL for tasks assessed/performed Overall Cognitive Status: History of cognitive impairments - at baseline                                 General Comments: Pt following most simple cues but with some repetition required - ?[facility]      Exercises      General Comments        Pertinent Vitals/Pain Pain Assessment: No/denies pain    Home Living                      Prior Function            PT Goals (current goals can now be found in the care plan section) Acute Rehab PT Goals Patient Stated Goal: Home PT Goal Formulation: With patient Time For Goal Achievement: 07/29/19 Potential to Achieve Goals: Good Progress towards PT goals: Progressing toward goals    Frequency    Min 3X/week      PT Plan Current plan remains appropriate    Co-evaluation              AM-PAC PT "6 Clicks" Mobility   Outcome Measure  Help needed turning from your back to your side while in a flat bed without using bedrails?: A Little Help needed moving from lying on your back to sitting on the side of a flat bed without using bedrails?: A Lot Help needed moving to and from a bed to a chair (including a wheelchair)?: A Lot Help needed standing up from a chair using your arms (e.g., wheelchair or bedside chair)?: A Lot Help needed to walk in hospital room?: A Lot Help needed climbing 3-5 steps with a railing? : A Lot 6 Click Score: 13    End of Session Equipment Utilized During Treatment: Gait belt Activity Tolerance: Patient limited by fatigue Patient left: in chair;with call bell/phone within reach;with chair alarm set Nurse Communication: Mobility status PT Visit Diagnosis: Muscle weakness (generalized) (M62.81);Difficulty in walking, not elsewhere  classified (R26.2)     Time: [id number] PT Time Calculation (min) (ACUTE ONLY): 24 min  Charges:  $Gait Training: 23-37 mins                     [place] Pager [phone] Office [phone]    Deane Wattenbarger 07/23/2019, 1:48 PM

## 2019-07-23 NOTE — Progress Notes (Signed)
Additional 20 units Novolog given as ordered for FSBS 574. Bed assigned for 1234. Will call report once approved.

## 2019-07-23 NOTE — Progress Notes (Signed)
Lab Results WBC  Date/Time Value Ref Range Status  07/21/2019 10:20 PM 6.3 4.0 - 10.5 K/uL Final  04/23/2018 03:57 AM 14.0 (H) 4.0 - 10.5 K/uL Final  05/08/2017 04:27 AM 6.8 4.0 - 10.5 K/uL Final   Neutrophils Relative %  Date/Time Value Ref Range Status  07/21/2019 10:20 PM 62 % Final  05/04/2017 08:41 AM 63 % Final  04/30/2017 06:37 AM 85 % Final   No results found for: PCO2ART Lactic Acid, Venous  Date/Time Value Ref Range Status  07/21/2019 10:20 PM 1.3 0.5 - 1.9 mmol/L Final    Comment:    Performed at [facility], [address] [place], [place] [address]  04/30/2017 07:06 AM 0.92 0.5 - 1.9 mmol/L Final  02/22/2017 05:53 PM 0.9 0.5 - 1.9 mmol/L Final   No results found for: PCO2VEN  Pt's baseline from bedside report 07/22/2019 1900

## 2019-07-23 NOTE — Progress Notes (Signed)
FSBS rechecked just now, 1 hr after 20 units novolog given for FSBS 599 as instructed. FSBS currently reads high and undetectable by bedside glucometer. Kyung Bacca, MD notified.

## 2019-07-24 LAB — URINALYSIS, ROUTINE W REFLEX MICROSCOPIC
Bilirubin Urine: NEGATIVE
Glucose, UA: >=500 mg/dL — AB
Ketones, ur: NEGATIVE mg/dL
Nitrite: POSITIVE — AB
Protein, ur: 100 mg/dL — AB
Specific Gravity, Urine: 1.025 (ref 1.005–1.030)
pH: 5.5 (ref 5.0–8.0)

## 2019-07-24 LAB — URINALYSIS, MICROSCOPIC (REFLEX)
RBC / HPF: NONE SEEN RBC/hpf (ref 0–5)
Squamous Epithelial / HPF: NONE SEEN (ref 0–5)
WBC, UA: >50 WBC/hpf (ref 0–5)

## 2019-07-24 LAB — CBC WITH DIFFERENTIAL/PLATELET
Abs Immature Granulocytes: 0.03 10*3/uL (ref 0.00–0.07)
Basophils Absolute: 0 10*3/uL (ref 0.0–0.1)
Basophils Relative: 0 %
Eosinophils Absolute: 0 10*3/uL (ref 0.0–0.5)
Eosinophils Relative: 0 %
HCT: 41 % (ref 36.0–46.0)
Hemoglobin: 13.5 g/dL (ref 12.0–15.0)
Immature Granulocytes: 0 %
Lymphocytes Relative: 15 %
Lymphs Abs: 1.1 10*3/uL (ref 0.7–4.0)
MCH: 28.7 pg (ref 26.0–34.0)
MCHC: 32.9 g/dL (ref 30.0–36.0)
MCV: 87 fL (ref 80.0–100.0)
Monocytes Absolute: 0.8 10*3/uL (ref 0.1–1.0)
Monocytes Relative: 11 %
Neutro Abs: 5.5 10*3/uL (ref 1.7–7.7)
Neutrophils Relative %: 74 %
Platelets: 205 10*3/uL (ref 150–400)
RBC: 4.71 MIL/uL (ref 3.87–5.11)
RDW: 13.2 % (ref 11.5–15.5)
WBC: 7.4 10*3/uL (ref 4.0–10.5)
nRBC: 0 % (ref 0.0–0.2)

## 2019-07-24 LAB — BASIC METABOLIC PANEL
Anion gap: 8 (ref 5–15)
BUN: 46 mg/dL — ABNORMAL HIGH (ref 8–23)
CO2: 24 mmol/L (ref 22–32)
Calcium: 7.9 mg/dL — ABNORMAL LOW (ref 8.9–10.3)
Chloride: 104 mmol/L (ref 98–111)
Creatinine, Ser: 0.72 mg/dL (ref 0.44–1.00)
GFR calc Af Amer: >60 mL/min (ref >60)
GFR calc non Af Amer: >60 mL/min (ref >60)
Glucose, Bld: 200 mg/dL — ABNORMAL HIGH (ref 70–99)
Potassium: 3.6 mmol/L (ref 3.5–5.1)
Sodium: 136 mmol/L (ref 135–145)

## 2019-07-24 LAB — COMPREHENSIVE METABOLIC PANEL
ALT: 29 U/L (ref 0–44)
AST: 39 U/L (ref 15–41)
Albumin: 2.8 g/dL — ABNORMAL LOW (ref 3.5–5.0)
Alkaline Phosphatase: 58 U/L (ref 38–126)
Anion gap: 9 (ref 5–15)
BUN: 44 mg/dL — ABNORMAL HIGH (ref 8–23)
CO2: 21 mmol/L — ABNORMAL LOW (ref 22–32)
Calcium: 7.9 mg/dL — ABNORMAL LOW (ref 8.9–10.3)
Chloride: 103 mmol/L (ref 98–111)
Creatinine, Ser: 1 mg/dL (ref 0.44–1.00)
GFR calc Af Amer: 59 mL/min — ABNORMAL LOW (ref >60)
GFR calc non Af Amer: 51 mL/min — ABNORMAL LOW (ref >60)
Glucose, Bld: 201 mg/dL — ABNORMAL HIGH (ref 70–99)
Potassium: 3.3 mmol/L — ABNORMAL LOW (ref 3.5–5.1)
Sodium: 133 mmol/L — ABNORMAL LOW (ref 135–145)
Total Bilirubin: 0.4 mg/dL (ref 0.3–1.2)
Total Protein: 6.9 g/dL (ref 6.5–8.1)

## 2019-07-24 LAB — GLUCOSE, CAPILLARY
Glucose-Capillary: 151 mg/dL — ABNORMAL HIGH (ref 70–99)
Glucose-Capillary: 162 mg/dL — ABNORMAL HIGH (ref 70–99)
Glucose-Capillary: 163 mg/dL — ABNORMAL HIGH (ref 70–99)
Glucose-Capillary: 164 mg/dL — ABNORMAL HIGH (ref 70–99)
Glucose-Capillary: 174 mg/dL — ABNORMAL HIGH (ref 70–99)
Glucose-Capillary: 182 mg/dL — ABNORMAL HIGH (ref 70–99)
Glucose-Capillary: 182 mg/dL — ABNORMAL HIGH (ref 70–99)
Glucose-Capillary: 184 mg/dL — ABNORMAL HIGH (ref 70–99)
Glucose-Capillary: 185 mg/dL — ABNORMAL HIGH (ref 70–99)
Glucose-Capillary: 190 mg/dL — ABNORMAL HIGH (ref 70–99)
Glucose-Capillary: 191 mg/dL — ABNORMAL HIGH (ref 70–99)
Glucose-Capillary: 211 mg/dL — ABNORMAL HIGH (ref 70–99)
Glucose-Capillary: 216 mg/dL — ABNORMAL HIGH (ref 70–99)
Glucose-Capillary: 226 mg/dL — ABNORMAL HIGH (ref 70–99)
Glucose-Capillary: 310 mg/dL — ABNORMAL HIGH (ref 70–99)
Glucose-Capillary: 352 mg/dL — ABNORMAL HIGH (ref 70–99)

## 2019-07-24 LAB — MRSA PCR SCREENING: MRSA by PCR: NEGATIVE

## 2019-07-24 LAB — URINE CULTURE: Culture: >=100000 — AB

## 2019-07-24 MED ORDER — INSULIN ASPART 100 UNIT/ML ~~LOC~~ SOLN
7.0000 [IU] | Freq: Once | SUBCUTANEOUS | Status: AC
  Administered 2019-07-24: 7 [IU] via SUBCUTANEOUS

## 2019-07-24 MED ORDER — INSULIN GLARGINE 100 UNIT/ML ~~LOC~~ SOLN
40.0000 [IU] | Freq: Every day | SUBCUTANEOUS | Status: DC
  Administered 2019-07-24 – 2019-07-25 (×2): 40 [IU] via SUBCUTANEOUS
  Filled 2019-07-24 (×2): qty 0.4

## 2019-07-24 MED ORDER — ORAL CARE MOUTH RINSE
15.0000 mL | Freq: Two times a day (BID) | OROMUCOSAL | Status: DC
  Administered 2019-07-24 – 2019-07-29 (×7): 15 mL via OROMUCOSAL

## 2019-07-24 MED ORDER — INSULIN ASPART 100 UNIT/ML ~~LOC~~ SOLN
5.0000 [IU] | Freq: Three times a day (TID) | SUBCUTANEOUS | Status: DC
  Administered 2019-07-24 – 2019-07-29 (×8): 5 [IU] via SUBCUTANEOUS

## 2019-07-24 NOTE — Progress Notes (Signed)
PROGRESS NOTE  [name] [id number] DOB: 1931-04-25 DOA: 07/21/2019 PCP: [name], MD  HPI/Recap of past 4 hours:  86 year old white female with past medical history significant for dementia congestive heart failure, diabetes, hypertension, and anxiety.  Patient was admitted 01/31/2020 with a temperature of 100.2 blood pressure in the 190s due to progressive generalized weakness she was Covid 19 positive.  Her chest x-ray however was negative for pneumonia she was noted to be unable to ambulate in the ED and hence was referred for admission and for physical therapy.   Subjective: Seen and examined at bedside.  Patient was noted to have tremors of her hands.  When I asked her to start demanding her nurse.  Nurse reported sugar was elevated.  And glucose she will keep weight going up and went as high as 600 over already [place] on the glucometer.  She received several coverage of insulin subcu.  Blood sugar is still uncontrolled so she will be transferred to stepdown for IV insulin.  She is still very weak.  Physical therapy recommends SNF.  I mentioned to her about possibly going to the nursing home she was all upset and crying that she does not want to go to nursing home.  I called her daughter and discussed this with her daughter granddaughter [name] and she is okay with patient coming home with physical therapy when she is ready to be discharged.  Also her daughter stated that she is usually on Xanax 0.5 mg 3 times a day at home which was not started since she has been in the hospital this might also be contributing to her shakiness or tremor.  So I will restart her Xanax but will reduce it to 0.5 milligrams 2 times daily.  Patient was started on remdesivir today  July 24, 2019 Subjective: . Patient seen and examined at bedside.  She was seen in the stepdown unit where she was having her IV insulin due to hypoglycemia that was uncontrolled by sliding scale yesterday so patient had to  be transferred to stepdown yesterday she is doing much better her blood sugars are coming down.Patient was tremulous.  But that has resolved with restarting her Xanax yesterday.  She was able to carry out a conversation   Assessment/Plan: Principal Problem:   COVID-19 Active Problems:   Essential hypertension   Weakness   Chronic diastolic CHF (congestive heart failure) ([place])   1.  COVID-19 infection Chest x-ray was negative she does not have any more cough or fever she is saturating well. Patient was started on remdesivir today  2.  Generalized weakness PT evaluation was done and they recommend SNF but patient does not want to go to SNF I spoke with her daughter about it they will be okay with discharging her home with home PT.  3.  Chronic diastolic congestive heart failure continue amlodipine Coreg Lasix potassium supplements  4.  Diabetes mellitus uncontrolled with hyperglycemia.  Patient suddenly started to have elevated blood sugars despite sliding scale coverage.  She WAS transferred to stepdown for IV insulin therapy.  Today this afternoon we were able to stop her IV insulin and started her on sliding scale with Lantus as well  5.  Hypertensive urgency resolved  6.  Urinary tract infection.  Nurse reported turbid urine from in and out cath patient has been started on ceftriaxone this urinary tract infection might be exacerbating and causing the uncontrolled blood sugar levels.  7.  Dementia.  Patient is still  conversant and able to understand some information because she became very upset when I try to discuss with her about possibly going to nursing home for rehab Code Status: Full full  Severity of Illness: The appropriate patient status for this patient is INPATIENT. Inpatient status is judged to be reasonable and necessary in order to provide the required intensity of service to ensure the patient's safety. The patient's presenting symptoms, physical exam findings, and  initial radiographic and laboratory data in the context of their chronic comorbidities is felt to place them at high risk for further clinical deterioration. Furthermore, it is not anticipated that the patient will be medically stable for discharge from the hospital within 2 midnights of admission. The following factors support the patient status of inpatient.   " The patient's presenting symptoms include Covid, urinary tract infection, hyperglycemia requiring IV insulin. " The worrisome physical exam findings include hyperglycemia with tremor requiring IV insulin. " The initial radiographic and laboratory data are worrisome because of hyperglycemia Covid requiring IV insulin. " The chronic co-morbidities include unsure elderly.   * I certify that at the point of admission it is my clinical judgment that the patient will require inpatient hospital care spanning beyond 2 midnights from the point of admission due to high intensity of service, high risk for further deterioration and high frequency of surveillance required.*    Family Communication: [name] granddaughter who is primary caregiver discussed possible SNF patient had refused refused SNF.  [name] is willing to have her back home with physical therapy  Disposition Plan: Back home to granddaughter with home PT   Consultants:  None  Procedures:  None  Antimicrobials:  Ceftriaxone  DVT prophylaxis: Lovenox   Objective: Vitals:   07/24/19 1000 07/24/19 1100 07/24/19 1200 07/24/19 1400  BP: (!) 165/66 (!) 143/60 (!) 149/52 (!) 152/66  Pulse: 65 (!) 56 (!) 59 64  Resp: (!) 22 (!) 22 (!) 22 (!) 23  Temp:   (!) 97.5 F (36.4 C)   TempSrc:   Oral   SpO2: 91% 90% 90% 91%  Weight:      Height:        Intake/Output Summary (Last 24 hours) at 07/24/2019 1551 Last data filed at 07/24/2019 1500 Gross per 24 hour  Intake 2917.36 ml  Output 1975 ml  Net 942.36 ml   Filed Weights   07/23/19 0900  Weight: 84.4 kg   Body  mass index is 30.95 kg/m.  Exam:  . General: 86 y.o. year-old female well developed well nourished in no acute distress.  Alert and oriented x3. . Cardiovascular: Regular rate and rhythm with no rubs or gallops.  No thyromegaly or JVD noted.   Marland Kitchen Respiratory: Clear to auscultation with no wheezes or rales. Good inspiratory effort. . Abdomen: Soft nontender nondistended with normal bowel sounds x4 quadrants. . Musculoskeletal: No lower extremity edema. 2/4 pulses in all 4 extremities. . Skin: No ulcerative lesions noted or rashes, . Psychiatry: Mood is appropriate for condition and setting patient is tremulous . Neurological: Patient was tremulous.  But that has resolved with restarting her Xanax.  She was able to carry out a conversation    Data Reviewed: CBC: Recent Labs  Lab 07/21/19 2220 07/23/19 1247 07/23/19 2032 07/24/19 0038  WBC 6.3 8.1 7.0 7.4  NEUTROABS 3.8 6.5 5.3 5.5  HGB 14.3 14.5 14.1 13.5  HCT 43.3 44.8 42.9 41.0  MCV 88.2 90.1 87.7 87.0  PLT 211 214 211 [id number]   Basic Metabolic Panel:  Recent Labs  Lab 07/21/19 2220 07/23/19 1247 07/23/19 1609 07/23/19 2032 07/24/19 0038 07/24/19 0730  NA 136 132* 130*  --  133* 136  K 4.1 4.2 4.8  --  3.3* 3.6  CL 99 100 95*  --  103 104  CO2 26 18* 19*  --  21* 24  GLUCOSE 289* 599* 661*  --  201* 200*  BUN 24* 41* 45*  --  44* 46*  CREATININE 1.02* 1.37* 1.31* 1.15* 1.00 0.72  CALCIUM 8.7* 8.0* 8.0*  --  7.9* 7.9*  PHOS  --   --   --  2.2*  --   --    GFR: Estimated Creatinine Clearance: 54.2 mL/min (by C-G formula based on SCr of 0.72 mg/dL). Liver Function Tests: Recent Labs  Lab 07/21/19 2220 07/23/19 1247 07/24/19 0038  AST 22 39 39  ALT 23 28 29   ALKPHOS 72 65 58  BILITOT 0.8 0.6 0.4  PROT 8.0 7.5 6.9  ALBUMIN 3.5 3.0* 2.8*   No results for input(s): LIPASE, AMYLASE in the last 168 hours. No results for input(s): AMMONIA in the last 168 hours. Coagulation Profile: Recent Labs  Lab 07/21/19 2220   INR 1.0   Cardiac Enzymes: No results for input(s): CKTOTAL, CKMB, CKMBINDEX, TROPONINI in the last 168 hours. BNP (last 3 results) No results for input(s): PROBNP in the last 8760 hours. HbA1C: Recent Labs    07/23/19 0101  HGBA1C 9.2*   CBG: Recent Labs  Lab 07/24/19 0909 07/24/19 1018 07/24/19 1110 07/24/19 1223 07/24/19 1318  GLUCAP 191* 163* 182* 184* 162*   Lipid Profile: No results for input(s): CHOL, HDL, LDLCALC, TRIG, CHOLHDL, LDLDIRECT in the last 72 hours. Thyroid Function Tests: No results for input(s): TSH, T4TOTAL, FREET4, T3FREE, THYROIDAB in the last 72 hours. Anemia Panel: No results for input(s): VITAMINB12, FOLATE, FERRITIN, TIBC, IRON, RETICCTPCT in the last 72 hours. Urine analysis:    Component Value Date/Time   COLORURINE AMBER (A) 07/21/2019 2221   APPEARANCEUR CLOUDY (A) 07/21/2019 2221   LABSPEC 1.016 07/21/2019 2221   PHURINE 5.0 07/21/2019 2221   GLUCOSEU NEGATIVE 07/21/2019 2221   HGBUR NEGATIVE 07/21/2019 2221   BILIRUBINUR NEGATIVE 07/21/2019 2221   KETONESUR NEGATIVE 07/21/2019 2221   PROTEINUR >=300 (A) 07/21/2019 2221   UROBILINOGEN 0.2 08/16/2010 2011   NITRITE NEGATIVE 07/21/2019 2221   LEUKOCYTESUR SMALL (A) 07/21/2019 2221   Sepsis Labs: @LABRCNTIP (procalcitonin:4,lacticidven:4)  ) Recent Results (from the past 240 hour(s))  Blood Culture (routine x 2)     Status: None (Preliminary result)   Collection Time: 07/21/19 10:21 PM   Specimen: BLOOD  Result Value Ref Range Status   Specimen Description   Final    BLOOD SITE NOT SPECIFIED Performed at [facility] [address], [place], [place] [address]    Special Requests   Final    BOTTLES DRAWN AEROBIC AND ANAEROBIC Blood Culture adequate volume Performed at [facility] [address], [place], [place] [address]    Culture   Final    NO GROWTH 2 DAYS Performed at [facility] [address] [place], [place] [address]    Report  Status PENDING  Incomplete  Urine culture     Status: Abnormal   Collection Time: 07/21/19 10:21 PM   Specimen: In/Out Cath Urine  Result Value Ref Range Status   Specimen Description   Final    IN/OUT CATH URINE Performed at [facility]  [address], [place], [place] [address]    Special Requests   Final    NONE Performed at [facility], [place] [address], [place], [place] [address]    Culture >=100,000 COLONIES/mL ESCHERICHIA COLI (A)  Final   Report Status 07/24/2019 FINAL  Final   Organism ID, Bacteria ESCHERICHIA COLI (A)  Final      Susceptibility   Escherichia coli - MIC*    AMPICILLIN 8 SENSITIVE Sensitive     CEFAZOLIN <=4 SENSITIVE Sensitive     CEFTRIAXONE <=0.25 SENSITIVE Sensitive     CIPROFLOXACIN <=0.25 SENSITIVE Sensitive     GENTAMICIN <=1 SENSITIVE Sensitive     IMIPENEM <=0.25 SENSITIVE Sensitive     NITROFURANTOIN <=16 SENSITIVE Sensitive     TRIMETH/SULFA <=20 SENSITIVE Sensitive     AMPICILLIN/SULBACTAM 4 SENSITIVE Sensitive     PIP/TAZO <=4 SENSITIVE Sensitive     * >=100,000 COLONIES/mL ESCHERICHIA COLI  MRSA PCR Screening     Status: None   Collection Time: 07/23/19 10:08 PM   Specimen: Nasal Mucosa; Nasopharyngeal  Result Value Ref Range Status   MRSA by PCR NEGATIVE NEGATIVE Final    Comment:        The GeneXpert MRSA Assay (FDA approved for NASAL specimens only), is one component of a comprehensive MRSA colonization surveillance program. It is not intended to diagnose MRSA infection nor to guide or monitor treatment for MRSA infections. Performed at [facility], [place] [address], [place], [place] [address]       Studies: No results found.  Scheduled Meds: . ALPRAZolam  0.5 mg Oral BID  . amLODipine  10 mg Oral q morning - 10a  . vitamin C  500 mg Oral Daily  . benzonatate  200 mg Oral TID  . carvedilol  6.25 mg Oral BID WC  . Chlorhexidine Gluconate Cloth  6 each Topical  Daily  . dexamethasone (DECADRON) injection  6 mg Intravenous Daily  . docusate sodium  100 mg Oral BID  . enoxaparin (LOVENOX) injection  40 mg Subcutaneous Q24H  . furosemide  40 mg Oral q morning - 10a  . glipiZIDE  10 mg Oral QAC breakfast  . insulin aspart  0-15 Units Subcutaneous TID WC  . insulin aspart  5 Units Subcutaneous TID WC  . insulin glargine  20 Units Subcutaneous QHS  . insulin glargine  40 Units Subcutaneous Daily  . mouth rinse  15 mL Mouth Rinse BID  . metFORMIN  500 mg Oral BID WC  . sodium chloride flush  3 mL Intravenous Q12H  . zinc sulfate  220 mg Oral Daily    Continuous Infusions: . sodium chloride 250 mL (07/22/19 1925)  . sodium chloride 75 mL/hr at 07/24/19 1321  . cefTRIAXone (ROCEPHIN)  IV Stopped (07/24/19 1352)  . dextrose 5 % and 0.45% NaCl Stopped (07/24/19 1316)  . insulin Stopped (07/24/19 1320)  . remdesivir 100 mg in NS 100 mL Stopped (07/24/19 1148)     LOS: 1 day     Cristal Deer, MD [facility]  To reach me or the doctor on call, go to: [email or web address] Password [email or web address]  07/24/2019, 3:51 PM

## 2019-07-24 NOTE — Progress Notes (Addendum)
Inpatient Diabetes Program Recommendations  AACE/ADA: New Consensus Statement on Inpatient Glycemic Control (2015)  Target Ranges:  Prepandial:   less than 140 mg/dL      Peak postprandial:   less than 180 mg/dL (1-2 hours)      Critically ill patients:  140 - 180 mg/dL   Lab Results  Component Value Date   GLUCAP 185 (H) 07/24/2019   HGBA1C 9.2 (H) 07/23/2019    Review of Glycemic Control Results for ROCELIA, DAGRACA (MRN [id number]) as of 07/24/2019 09:52  Ref. Range 07/24/2019 03:43 07/24/2019 04:53 07/24/2019 05:54 07/24/2019 08:02  Glucose-Capillary Latest Ref Range: 70 - 99 mg/dL 174 (H) 151 (H) 164 (H) 185 (H)  Diabetes history: DM 2 Outpatient Diabetes medications:  Lantus 65 units daily, Humalog 0-15 units tid with meals, Metformin 500 mg bid Current orders for Inpatient glycemic control:  IV insulin/Hyperglycemia mode Lantus 20 units q HS Novolog moderate tid with meals Decadron 6 mg daily  Inpatient Diabetes Program Recommendations:    Per RN-IV insulin still infusing.  Please add Lantus 40 units now (due to short 1/2 life of IV insulin).  Also add Novolog meal coverage 5 units tid with meals.  Sent message to MD.   Thanks  Adah Perl, RN, BC-ADM Inpatient Diabetes Coordinator Pager [phone] (8a-5p)

## 2019-07-25 LAB — BASIC METABOLIC PANEL
Anion gap: 12 (ref 5–15)
Anion gap: 10 (ref 5–15)
BUN: 48 mg/dL — ABNORMAL HIGH (ref 8–23)
BUN: 45 mg/dL — ABNORMAL HIGH (ref 8–23)
CO2: 20 mmol/L — ABNORMAL LOW (ref 22–32)
CO2: 18 mmol/L — ABNORMAL LOW (ref 22–32)
Calcium: 7.7 mg/dL — ABNORMAL LOW (ref 8.9–10.3)
Calcium: 7.7 mg/dL — ABNORMAL LOW (ref 8.9–10.3)
Chloride: 104 mmol/L (ref 98–111)
Chloride: 107 mmol/L (ref 98–111)
Creatinine, Ser: 1.04 mg/dL — ABNORMAL HIGH (ref 0.44–1.00)
Creatinine, Ser: 0.93 mg/dL (ref 0.44–1.00)
GFR calc Af Amer: 56 mL/min — ABNORMAL LOW (ref >60)
GFR calc Af Amer: >60 mL/min (ref >60)
GFR calc non Af Amer: 49 mL/min — ABNORMAL LOW (ref >60)
GFR calc non Af Amer: 56 mL/min — ABNORMAL LOW (ref >60)
Glucose, Bld: 298 mg/dL — ABNORMAL HIGH (ref 70–99)
Glucose, Bld: 336 mg/dL — ABNORMAL HIGH (ref 70–99)
Potassium: 3.8 mmol/L (ref 3.5–5.1)
Potassium: 4.1 mmol/L (ref 3.5–5.1)
Sodium: 136 mmol/L (ref 135–145)
Sodium: 135 mmol/L (ref 135–145)

## 2019-07-25 LAB — GLUCOSE, CAPILLARY
Glucose-Capillary: 187 mg/dL — ABNORMAL HIGH (ref 70–99)
Glucose-Capillary: 158 mg/dL — ABNORMAL HIGH (ref 70–99)
Glucose-Capillary: 325 mg/dL — ABNORMAL HIGH (ref 70–99)
Glucose-Capillary: 247 mg/dL — ABNORMAL HIGH (ref 70–99)

## 2019-07-25 MED ORDER — INSULIN GLARGINE 100 UNIT/ML ~~LOC~~ SOLN
40.0000 [IU] | Freq: Every day | SUBCUTANEOUS | Status: DC
  Administered 2019-07-25: 40 [IU] via SUBCUTANEOUS
  Filled 2019-07-25 (×2): qty 0.4

## 2019-07-25 MED ORDER — LABETALOL HCL 5 MG/ML IV SOLN
10.0000 mg | INTRAVENOUS | Status: DC | PRN
  Administered 2019-07-25: 10 mg via INTRAVENOUS
  Filled 2019-07-25 (×2): qty 4

## 2019-07-25 MED ORDER — CARVEDILOL 12.5 MG PO TABS
12.5000 mg | ORAL_TABLET | Freq: Two times a day (BID) | ORAL | Status: DC
  Administered 2019-07-26 (×2): 12.5 mg via ORAL
  Filled 2019-07-25 (×3): qty 1

## 2019-07-25 MED ORDER — CEPHALEXIN 500 MG PO CAPS
500.0000 mg | ORAL_CAPSULE | Freq: Three times a day (TID) | ORAL | Status: DC
  Administered 2019-07-25 – 2019-07-29 (×7): 500 mg via ORAL
  Filled 2019-07-25 (×10): qty 1

## 2019-07-25 MED ORDER — INSULIN GLARGINE 100 UNIT/ML ~~LOC~~ SOLN
30.0000 [IU] | Freq: Every day | SUBCUTANEOUS | Status: DC
  Filled 2019-07-25: qty 0.3

## 2019-07-25 MED ORDER — GLIPIZIDE 10 MG PO TABS
10.0000 mg | ORAL_TABLET | Freq: Every day | ORAL | Status: DC
  Administered 2019-07-26: 10 mg via ORAL
  Filled 2019-07-25: qty 1

## 2019-07-25 NOTE — Progress Notes (Addendum)
PROGRESS NOTE    [name]  [id number] DOB: 10/25/1930 DOA: 07/21/2019 PCP: [name], MD    Brief Narrative:  86 year old white female with past medical history significant for dementia congestive heart failure, diabetes, hypertension, and anxiety.  Patient was admitted 01/31/2020 with a temperature of 100.2 blood pressure in the 190s due to progressive generalized weakness she was Covid 19 positive.  Her chest x-ray however was negative for pneumonia she was noted to be unable to ambulate in the ED and hence was referred for admission and for physical therapy. Subjective: Seen and examined at bedside.  Patient was noted to have tremors of her hands.  When I asked her to start demanding her nurse.  Nurse reported sugar was elevated.  And glucose she will keep weight going up and went as high as 600 over already [place] on the glucometer.  She received several coverage of insulin subcu.  Blood sugar is still uncontrolled so she will be transferred to stepdown for IV insulin.  She is still very weak.  Physical therapy recommends SNF.  I mentioned to her about possibly going to the nursing home she was all upset and crying that she does not want to go to nursing home.  I called her daughter and discussed this with her daughter granddaughter [name] and she is okay with patient coming home with physical therapy when she is ready to be discharged.  Also her daughter stated that she is usually on Xanax 0.5 mg 3 times a day at home which was not started since she has been in the hospital this might also be contributing to her shakiness or tremor.  So I will restart her Xanax but will reduce it to 0.5 milligrams 2 times daily.  Patient was started on remdesivir today  July 24, 2019 Subjective:  Patient seen and examined at bedside.  She was seen in the stepdown unit where she was having her IV insulin due to hypoglycemia that was uncontrolled by sliding scale yesterday so patient had to be  transferred to stepdown yesterday she is doing much better her blood sugars are coming down.Patient was tremulous.  But that has resolved with restarting her Xanax yesterday.  She was able to carry out a conversation  July 25, 2019 Patient in no distress Blood sugar went down to 180 Anion gap closed We will resume Lantus and insulin sliding scale Urine analysis positive for UTI  Assessment & Plan:   Principal Problem:   COVID-19 Active Problems:   Essential hypertension   Weakness   Chronic diastolic CHF (congestive heart failure) (HCC)  Assessment plan 1.  COVID-19 infection Chest x-ray was negative she does not have any more cough or fever she is saturating well. Patient was started on remdesivir   2.  Generalized weakness PT evaluation was done and they recommend SNF but patient does not want to go to SNF I spoke with her daughter about it they will be okay with discharging her home with home PT.  3.  Chronic diastolic congestive heart failure continue amlodipine Coreg Lasix potassium supplements  4.  Diabetes mellitus uncontrolled with hyperglycemia.  Patient suddenly started to have elevated blood sugars despite sliding scale coverage.  She WAS transferred to stepdown for IV insulin therapy.  Today this afternoon we were able to stop her IV insulin and started her on sliding scale with Lantus as well Continue with Lantus and insulin sliding scale We will hold the p.o. medication now  5.  Hypertensive urgency resolved  6.  Urinary tract infection.  Nurse reported turbid urine from in and out cath patient has been started on ceftriaxone this urinary tract infection might be exacerbating and causing the uncontrolled blood sugar levels Start Keflex 500 every 8h  7.  Dementia.  Patient is still conversant and able to understand some information because she became very upset when I try to discuss with her about possibly going to nursing home for rehab Code Status: Full  full   DVT prophylaxis: Lovenox) Code Status: Full code Family Communication: Not yet Disposition Plan: Discharge home 2 days   Consultants: None  Procedures:  None Antimicrobials: (specify start and planned stop date. Auto populated tables are space occupying and do not give end dates) Keflex for 5 days   Subjective: No respiratory distress Stable hemodynamically Saturation 91%  Objective: Vitals:   07/25/19 1215 07/25/19 1230 07/25/19 1245 07/25/19 1530  BP:      Pulse: 63 63 62 69  Resp: (!) 27 (!) 26 (!) 26 (!) 32  Temp:      TempSrc:      SpO2: 91% 91% 92% 91%  Weight:      Height:        Intake/Output Summary (Last 24 hours) at 07/25/2019 1613 Last data filed at 07/25/2019 1530 Gross per 24 hour  Intake 1756.17 ml  Output --  Net 1756.17 ml   Filed Weights   07/23/19 0900  Weight: 84.4 kg    Examination:  General exam: Appears calm and comfortable  Respiratory system: Clear to auscultation. Respiratory effort normal. Cardiovascular system: S1 & S2 heard, RRR. No JVD, murmurs, rubs, gallops or clicks. No pedal edema. Gastrointestinal system: Abdomen is nondistended, soft and nontender. No organomegaly or masses felt. Normal bowel sounds heard. Central nervous system: Alert and oriented. No focal neurological deficits. Extremities: Symmetric 5 x 5 power. Skin: No rashes, lesions or ulcers Psychiatry: Judgement and insight appear normal. Mood & affect appropriate.     Data Reviewed: I have personally reviewed following labs and imaging studies  CBC: Recent Labs  Lab 07/21/19 2220 07/23/19 1247 07/23/19 2032 07/24/19 0038  WBC 6.3 8.1 7.0 7.4  NEUTROABS 3.8 6.5 5.3 5.5  HGB 14.3 14.5 14.1 13.5  HCT 43.3 44.8 42.9 41.0  MCV 88.2 90.1 87.7 87.0  PLT 211 214 211 [id number]   Basic Metabolic Panel: Recent Labs  Lab 07/23/19 1247 07/23/19 1609 07/23/19 2032 07/24/19 0038 07/24/19 0730 07/25/19 0201  NA 132* 130*  --  133* 136 136  K 4.2 4.8   --  3.3* 3.6 3.8  CL 100 95*  --  103 104 104  CO2 18* 19*  --  21* 24 20*  GLUCOSE 599* 661*  --  201* 200* 298*  BUN 41* 45*  --  44* 46* 48*  CREATININE 1.37* 1.31* 1.15* 1.00 0.72 1.04*  CALCIUM 8.0* 8.0*  --  7.9* 7.9* 7.7*  PHOS  --   --  2.2*  --   --   --    GFR: Estimated Creatinine Clearance: 41.7 mL/min (A) (by C-G formula based on SCr of 1.04 mg/dL (H)). Liver Function Tests: Recent Labs  Lab 07/21/19 2220 07/23/19 1247 07/24/19 0038  AST 22 39 39  ALT 23 28 29   ALKPHOS 72 65 58  BILITOT 0.8 0.6 0.4  PROT 8.0 7.5 6.9  ALBUMIN 3.5 3.0* 2.8*   No results for input(s): LIPASE, AMYLASE in the last 168 hours. No results for  input(s): AMMONIA in the last 168 hours. Coagulation Profile: Recent Labs  Lab 07/21/19 2220  INR 1.0   Cardiac Enzymes: No results for input(s): CKTOTAL, CKMB, CKMBINDEX, TROPONINI in the last 168 hours. BNP (last 3 results) No results for input(s): PROBNP in the last 8760 hours. HbA1C: Recent Labs    07/23/19 0101  HGBA1C 9.2*   CBG: Recent Labs  Lab 07/24/19 1709 07/24/19 2019 07/24/19 2349 07/25/19 0914 07/25/19 1104  GLUCAP 211* 352* 310* 187* 158*   Lipid Profile: No results for input(s): CHOL, HDL, LDLCALC, TRIG, CHOLHDL, LDLDIRECT in the last 72 hours. Thyroid Function Tests: No results for input(s): TSH, T4TOTAL, FREET4, T3FREE, THYROIDAB in the last 72 hours. Anemia Panel: No results for input(s): VITAMINB12, FOLATE, FERRITIN, TIBC, IRON, RETICCTPCT in the last 72 hours. Sepsis Labs: Recent Labs  Lab 07/21/19 2220  LATICACIDVEN 1.3    Recent Results (from the past 240 hour(s))  Blood Culture (routine x 2)     Status: None (Preliminary result)   Collection Time: 07/21/19 10:21 PM   Specimen: BLOOD  Result Value Ref Range Status   Specimen Description   Final    BLOOD SITE NOT SPECIFIED Performed at [facility] [address], [place], [place] [address]    Special Requests   Final    BOTTLES DRAWN  AEROBIC AND ANAEROBIC Blood Culture adequate volume Performed at [facility] [address], [place], [place] [address]    Culture   Final    NO GROWTH 3 DAYS Performed at [facility], [place] [address] [place], [place] [address]    Report Status PENDING  Incomplete  Urine culture     Status: Abnormal   Collection Time: 07/21/19 10:21 PM   Specimen: In/Out Cath Urine  Result Value Ref Range Status   Specimen Description   Final    IN/OUT CATH URINE Performed at [facility] [address], [place], [place] [address]    Special Requests   Final    NONE Performed at [facility], [place] [address] [place], [place] [address]    Culture >=100,000 COLONIES/mL ESCHERICHIA COLI (A)  Final   Report Status 07/24/2019 FINAL  Final   Organism ID, Bacteria ESCHERICHIA COLI (A)  Final      Susceptibility   Escherichia coli - MIC*    AMPICILLIN 8 SENSITIVE Sensitive     CEFAZOLIN <=4 SENSITIVE Sensitive     CEFTRIAXONE <=0.25 SENSITIVE Sensitive     CIPROFLOXACIN <=0.25 SENSITIVE Sensitive     GENTAMICIN <=1 SENSITIVE Sensitive     IMIPENEM <=0.25 SENSITIVE Sensitive     NITROFURANTOIN <=16 SENSITIVE Sensitive     TRIMETH/SULFA <=20 SENSITIVE Sensitive     AMPICILLIN/SULBACTAM 4 SENSITIVE Sensitive     PIP/TAZO <=4 SENSITIVE Sensitive     * >=100,000 COLONIES/mL ESCHERICHIA COLI  MRSA PCR Screening     Status: None   Collection Time: 07/23/19 10:08 PM   Specimen: Nasal Mucosa; Nasopharyngeal  Result Value Ref Range Status   MRSA by PCR NEGATIVE NEGATIVE Final    Comment:        The GeneXpert MRSA Assay (FDA approved for NASAL specimens only), is one component of a comprehensive MRSA colonization surveillance program. It is not intended to diagnose MRSA infection nor to guide or monitor treatment for MRSA infections. Performed at [facility], [place] [address], [place], [place] [address]  Radiology Studies: No results found.      Scheduled Meds: . ALPRAZolam  0.5 mg Oral BID  . amLODipine  10 mg Oral q morning - 10a  . vitamin C  500 mg Oral Daily  . benzonatate  200 mg Oral TID  . carvedilol  6.25 mg Oral BID WC  . cephALEXin  500 mg Oral Q8H  . Chlorhexidine Gluconate Cloth  6 each Topical Daily  . dexamethasone (DECADRON) injection  6 mg Intravenous Daily  . docusate sodium  100 mg Oral BID  . enoxaparin (LOVENOX) injection  40 mg Subcutaneous Q24H  . furosemide  40 mg Oral q morning - 10a  . glipiZIDE  10 mg Oral QAC breakfast  . insulin aspart  0-15 Units Subcutaneous TID WC  . insulin aspart  5 Units Subcutaneous TID WC  . insulin glargine  20 Units Subcutaneous QHS  . insulin glargine  40 Units Subcutaneous Daily  . mouth rinse  15 mL Mouth Rinse BID  . metFORMIN  500 mg Oral BID WC  . sodium chloride flush  3 mL Intravenous Q12H  . zinc sulfate  220 mg Oral Daily   Continuous Infusions: . sodium chloride 250 mL (07/22/19 1925)  . sodium chloride 75 mL/hr at 07/25/19 1531  . dextrose 5 % and 0.45% NaCl Stopped (07/24/19 1316)  . insulin Stopped (07/24/19 1320)  . remdesivir 100 mg in NS 100 mL Stopped (07/25/19 0953)     LOS: 2 days    Time spent: 35 minutes    Mica Releford G Suleima Ohlendorf, MD [facility] Pager [phone]  If 7PM-7AM, please contact night-coverage [email or web address] Password [facility] 07/25/2019, 4:13 PM

## 2019-07-26 ENCOUNTER — Inpatient Hospital Stay (HOSPITAL_COMMUNITY): Payer: Medicare Other

## 2019-07-26 LAB — BASIC METABOLIC PANEL
Anion gap: 8 (ref 5–15)
BUN: 37 mg/dL — ABNORMAL HIGH (ref 8–23)
CO2: 20 mmol/L — ABNORMAL LOW (ref 22–32)
Calcium: 7.7 mg/dL — ABNORMAL LOW (ref 8.9–10.3)
Chloride: 110 mmol/L (ref 98–111)
Creatinine, Ser: 0.87 mg/dL (ref 0.44–1.00)
GFR calc Af Amer: >60 mL/min (ref >60)
GFR calc non Af Amer: >60 mL/min (ref >60)
Glucose, Bld: 159 mg/dL — ABNORMAL HIGH (ref 70–99)
Potassium: 4 mmol/L (ref 3.5–5.1)
Sodium: 138 mmol/L (ref 135–145)

## 2019-07-26 LAB — GLUCOSE, CAPILLARY
Glucose-Capillary: 311 mg/dL — ABNORMAL HIGH (ref 70–99)
Glucose-Capillary: 158 mg/dL — ABNORMAL HIGH (ref 70–99)
Glucose-Capillary: 112 mg/dL — ABNORMAL HIGH (ref 70–99)
Glucose-Capillary: 122 mg/dL — ABNORMAL HIGH (ref 70–99)
Glucose-Capillary: 294 mg/dL — ABNORMAL HIGH (ref 70–99)
Glucose-Capillary: 318 mg/dL — ABNORMAL HIGH (ref 70–99)

## 2019-07-26 MED ORDER — IPRATROPIUM BROMIDE HFA 17 MCG/ACT IN AERS
2.0000 | INHALATION_SPRAY | RESPIRATORY_TRACT | Status: DC
  Administered 2019-07-26 (×4): 2 via RESPIRATORY_TRACT
  Filled 2019-07-26: qty 12.9

## 2019-07-26 MED ORDER — DIPHENHYDRAMINE HCL 50 MG/ML IJ SOLN
INTRAMUSCULAR | Status: AC
  Filled 2019-07-26: qty 1

## 2019-07-26 MED ORDER — AEROCHAMBER PLUS FLO-VU MEDIUM MISC
1.0000 | Freq: Once | Status: AC
  Administered 2019-07-26: 1
  Filled 2019-07-26: qty 1

## 2019-07-26 MED ORDER — INSULIN ASPART 100 UNIT/ML ~~LOC~~ SOLN
5.0000 [IU] | Freq: Once | SUBCUTANEOUS | Status: AC
  Administered 2019-07-27: 5 [IU] via SUBCUTANEOUS

## 2019-07-26 MED ORDER — ALBUTEROL SULFATE HFA 108 (90 BASE) MCG/ACT IN AERS
2.0000 | INHALATION_SPRAY | Freq: Four times a day (QID) | RESPIRATORY_TRACT | Status: DC
  Administered 2019-07-26 – 2019-07-29 (×9): 2 via RESPIRATORY_TRACT

## 2019-07-26 MED ORDER — DIPHENHYDRAMINE HCL 50 MG/ML IJ SOLN
12.5000 mg | Freq: Once | INTRAMUSCULAR | Status: AC
  Administered 2019-07-26: 12.5 mg via INTRAVENOUS

## 2019-07-26 MED ORDER — BUDESONIDE 180 MCG/ACT IN AEPB
1.0000 | INHALATION_SPRAY | Freq: Two times a day (BID) | RESPIRATORY_TRACT | Status: DC
  Administered 2019-07-26 – 2019-07-29 (×5): 1 via RESPIRATORY_TRACT
  Filled 2019-07-26: qty 1

## 2019-07-26 MED ORDER — ALBUTEROL SULFATE HFA 108 (90 BASE) MCG/ACT IN AERS
2.0000 | INHALATION_SPRAY | Freq: Four times a day (QID) | RESPIRATORY_TRACT | Status: DC | PRN
  Administered 2019-07-26: 2 via RESPIRATORY_TRACT
  Filled 2019-07-26: qty 6.7

## 2019-07-26 MED ORDER — FUROSEMIDE 20 MG PO TABS
20.0000 mg | ORAL_TABLET | Freq: Every day | ORAL | Status: DC
  Administered 2019-07-28 – 2019-07-29 (×2): 20 mg via ORAL
  Filled 2019-07-26 (×2): qty 1

## 2019-07-26 MED ORDER — FUROSEMIDE 10 MG/ML IJ SOLN
20.0000 mg | Freq: Once | INTRAMUSCULAR | Status: AC
  Administered 2019-07-26: 20 mg via INTRAVENOUS
  Filled 2019-07-26: qty 2

## 2019-07-26 MED ORDER — NON FORMULARY: 180.0000 ug | Freq: Two times a day (BID) | Status: DC

## 2019-07-26 NOTE — Progress Notes (Addendum)
PROGRESS NOTE    [name]  [id number] DOB: 04/05/1931 DOA: 07/21/2019 PCP: [name], MD    Brief Narrative:   Brief Narrative:  86 year old white female with past medical history significant for dementia congestive heart failure, diabetes, hypertension, and anxiety. Patient was admitted 01/31/2020 with a temperature of 100.2 blood pressure in the 190s due to progressive generalized weakness she was Covid 19 positive. Her chest x-ray however was negative for pneumonia she was noted to be unable to ambulate in the ED and hence was referred for admission and for physical therapy. Subjective: Seen and examined at bedside. Patient was noted to have tremors of her hands. When I asked her to start demanding her nurse. Nurse reported sugar was elevated. And glucose she will keep weight going up and went as high as 600 over already [place] on the glucometer. She received several coverage of insulin subcu. Blood sugar is still uncontrolled so she will be transferred to stepdown for IV insulin. She is still very weak. Physical therapy recommends SNF. I mentioned to her about possibly going to the nursing home she was all upset and crying that she does not want to go to nursing home. I called her daughter and discussed this with her daughter granddaughter [name] and she is okay with patient coming home with physical therapy when she is ready to be discharged. Also her daughter stated that she is usually on Xanax 0.5 mg 3 times a day at home which was not started since she has been in the hospital this might also be contributing to her shakiness or tremor. So I will restart her Xanax but will reduce it to 0.5 milligrams 2 times daily. Patient was started on remdesivir today  Assessment & Plan:   Principal Problem:   COVID-19 Active Problems:   Essential hypertension   Weakness   Chronic diastolic CHF (congestive heart failure) (HCC)   DVT prophylaxis: Lovenox) Code Status: Full  code Family Communication: Not yet Disposition Plan: Discharge home 2 days  Assessment plan 1. COVID-19 infection Chest x-ray was negative she does not have any more cough or fever she is saturating well. Patient was started on remdesivir   2. Generalized weakness PT evaluation was done and they recommend SNF but patient does not want to go to SNF I spoke with her daughter about it they will be okay with discharging her home with home PT.  3. Chronic diastolic congestive heart failure continue amlodipine Coreg Lasix potassium supplements  4. Diabetes mellitus uncontrolled with hyperglycemia. Patient suddenly started to have elevated blood sugars despite sliding scale coverage. She [place] to stepdown for IV insulin therapy.Today this afternoon we were able to stop her IV insulin and started her on sliding scale with Lantus as well Continue with Lantus and insulin sliding scale We will hold the p.o. medication now  5. Hypertensive urgency resolved  6. Urinary tract infection. Nurse reported turbid urine from in and out cathpatient has been started on ceftriaxone this urinary tract infection might be exacerbating and causing the uncontrolled blood sugar levels Start Keflex 500 every 8h  7. Dementia. Patient is still conversant and able to understand some information because she became very upset when I try to discuss with her about possibly going to nursing home for rehab Code Status:Full full     Subjective: Patient with severe wheezing But in no respiratory distress No other complaints  Objective: Vitals:   07/25/19 2225 07/26/19 0543 07/26/19 0634 07/26/19 1347  BP: Marland Kitchen)  166/74 (!) 184/78 (!) 150/93 (!) 168/88  Pulse: 67 71 70 70  Resp: 20 (!) 24  18  Temp: 98 F (36.7 C) 97.9 F (36.6 C)  98 F (36.7 C)  TempSrc: Oral Oral  Oral  SpO2: 95% 95%  95%  Weight:      Height:        Intake/Output Summary (Last 24 hours) at 07/26/2019  1822 Last data filed at 07/26/2019 1800 Gross per 24 hour  Intake 2191.94 ml  Output 1700 ml  Net 491.94 ml   Filed Weights   07/23/19 0900  Weight: 84.4 kg    Examination:  General exam: Appears calm and comfortable  Respiratory system: Severe wheezing Cardiovascular system: S1 & S2 heard, RRR. No JVD, murmurs, rubs, gallops or clicks. No pedal edema. Gastrointestinal system: Abdomen is nondistended, soft and nontender. No organomegaly or masses felt. Normal bowel sounds heard. Central nervous system: Alert and oriented. No focal neurological deficits. Extremities: Symmetric 5 x 5 power. Skin: No rashes, lesions or ulcers Psychiatry: Judgement and insight appear normal. Mood & affect appropriate.     Data Reviewed: I have personally reviewed following labs and imaging studies  CBC: Recent Labs  Lab 07/21/19 2220 07/23/19 1247 07/23/19 2032 07/24/19 0038  WBC 6.3 8.1 7.0 7.4  NEUTROABS 3.8 6.5 5.3 5.5  HGB 14.3 14.5 14.1 13.5  HCT 43.3 44.8 42.9 41.0  MCV 88.2 90.1 87.7 87.0  PLT 211 214 211 [id number]   Basic Metabolic Panel: Recent Labs  Lab 07/23/19 1609 07/23/19 2032 07/24/19 0038 07/24/19 0730 07/25/19 0201 07/25/19 2005 07/26/19 0517  NA  --   --  133* 136 136 135 138  K  --   --  3.3* 3.6 3.8 4.1 4.0  CL  --   --  103 104 104 107 110  CO2  --   --  21* 24 20* 18* 20*  GLUCOSE  --   --  201* 200* 298* 336* 159*  BUN  --   --  44* 46* 48* 45* 37*  CREATININE   < > 1.15* 1.00 0.72 1.04* 0.93 0.87  CALCIUM  --   --  7.9* 7.9* 7.7* 7.7* 7.7*  PHOS  --  2.2*  --   --   --   --   --    < > = values in this interval not displayed.   GFR: Estimated Creatinine Clearance: 49.8 mL/min (by C-G formula based on SCr of 0.87 mg/dL). Liver Function Tests: Recent Labs  Lab 07/21/19 2220 07/23/19 1247 07/24/19 0038  AST 22 39 39  ALT 23 28 29   ALKPHOS 72 65 58  BILITOT 0.8 0.6 0.4  PROT 8.0 7.5 6.9  ALBUMIN 3.5 3.0* 2.8*   No results for input(s): LIPASE,  AMYLASE in the last 168 hours. No results for input(s): AMMONIA in the last 168 hours. Coagulation Profile: Recent Labs  Lab 07/21/19 2220  INR 1.0   Cardiac Enzymes: No results for input(s): CKTOTAL, CKMB, CKMBINDEX, TROPONINI in the last 168 hours. BNP (last 3 results) No results for input(s): PROBNP in the last 8760 hours. HbA1C: No results for input(s): HGBA1C in the last 72 hours. CBG: Recent Labs  Lab 07/25/19 2324 07/26/19 0417 07/26/19 0724 07/26/19 1110 07/26/19 1759  GLUCAP 247* 158* 112* 122* 294*   Lipid Profile: No results for input(s): CHOL, HDL, LDLCALC, TRIG, CHOLHDL, LDLDIRECT in the last 72 hours. Thyroid Function Tests: No results for input(s): TSH, T4TOTAL, FREET4, T3FREE, THYROIDAB  in the last 72 hours. Anemia Panel: No results for input(s): VITAMINB12, FOLATE, FERRITIN, TIBC, IRON, RETICCTPCT in the last 72 hours. Sepsis Labs: Recent Labs  Lab 07/21/19 2220  LATICACIDVEN 1.3    Recent Results (from the past 240 hour(s))  Blood Culture (routine x 2)     Status: None (Preliminary result)   Collection Time: 07/21/19 10:21 PM   Specimen: BLOOD  Result Value Ref Range Status   Specimen Description   Final    BLOOD SITE NOT SPECIFIED Performed at [facility] [address] [place], [place] [address]    Special Requests   Final    BOTTLES DRAWN AEROBIC AND ANAEROBIC Blood Culture adequate volume Performed at [facility] [address], [place], [place] [address]    Culture   Final    NO GROWTH 4 DAYS Performed at [facility], [place] [address], [place], [place] [address]    Report Status PENDING  Incomplete  Urine culture     Status: Abnormal   Collection Time: 07/21/19 10:21 PM   Specimen: In/Out Cath Urine  Result Value Ref Range Status   Specimen Description   Final    IN/OUT CATH URINE Performed at [facility] [address] [place], [place] [address]    Special Requests   Final     NONE Performed at [facility], [place] [address] [place], [place] [address]    Culture >=100,000 COLONIES/mL ESCHERICHIA COLI (A)  Final   Report Status 07/24/2019 FINAL  Final   Organism ID, Bacteria ESCHERICHIA COLI (A)  Final      Susceptibility   Escherichia coli - MIC*    AMPICILLIN 8 SENSITIVE Sensitive     CEFAZOLIN <=4 SENSITIVE Sensitive     CEFTRIAXONE <=0.25 SENSITIVE Sensitive     CIPROFLOXACIN <=0.25 SENSITIVE Sensitive     GENTAMICIN <=1 SENSITIVE Sensitive     IMIPENEM <=0.25 SENSITIVE Sensitive     NITROFURANTOIN <=16 SENSITIVE Sensitive     TRIMETH/SULFA <=20 SENSITIVE Sensitive     AMPICILLIN/SULBACTAM 4 SENSITIVE Sensitive     PIP/TAZO <=4 SENSITIVE Sensitive     * >=100,000 COLONIES/mL ESCHERICHIA COLI  MRSA PCR Screening     Status: None   Collection Time: 07/23/19 10:08 PM   Specimen: Nasal Mucosa; Nasopharyngeal  Result Value Ref Range Status   MRSA by PCR NEGATIVE NEGATIVE Final    Comment:        The GeneXpert MRSA Assay (FDA approved for NASAL specimens only), is one component of a comprehensive MRSA colonization surveillance program. It is not intended to diagnose MRSA infection nor to guide or monitor treatment for MRSA infections. Performed at [facility], [place] [address] [place],  [address]          Radiology Studies: DG Chest 1 View  Result Date: 07/26/2019 CLINICAL DATA:  Shortness of breath. COVID positive. EXAM: CHEST  1 VIEW COMPARISON:  07/21/2019 FINDINGS: 0908 hours. The cardio pericardial silhouette is enlarged. Patchy airspace disease at the left base is minimally progressed in the interval. Right lung clear. Stable asymmetric elevation right hemidiaphragm. The visualized bony structures of the thorax are intact. IMPRESSION: Slight interval progression of patchy airspace disease at the left base. This may be related to atelectasis or pneumonia. Electronically Signed   By: Misty Stanley M.D.   On: 07/26/2019 09:26        Scheduled Meds: . albuterol  2 puff Inhalation  Q6H  . ALPRAZolam  0.5 mg Oral BID  . amLODipine  10 mg Oral q morning - 10a  . vitamin C  500 mg Oral Daily  . benzonatate  200 mg Oral TID  . budesonide  1 puff Inhalation BID  . carvedilol  12.5 mg Oral BID WC  . cephALEXin  500 mg Oral Q8H  . Chlorhexidine Gluconate Cloth  6 each Topical Daily  . dexamethasone (DECADRON) injection  6 mg Intravenous Daily  . docusate sodium  100 mg Oral BID  . enoxaparin (LOVENOX) injection  40 mg Subcutaneous Q24H  . furosemide  40 mg Oral q morning - 10a  . insulin aspart  0-15 Units Subcutaneous TID WC  . insulin aspart  5 Units Subcutaneous TID WC  . insulin glargine  40 Units Subcutaneous QHS  . ipratropium  2 puff Inhalation Q4H  . mouth rinse  15 mL Mouth Rinse BID  . sodium chloride flush  3 mL Intravenous Q12H  . zinc sulfate  220 mg Oral Daily   Continuous Infusions: . sodium chloride 250 mL (07/22/19 1925)  . sodium chloride 75 mL/hr at 07/26/19 1217     LOS: 3 days    Time spent: 35 minutes    Assunta Found, MD [facility] Pager [phone]  If 7PM-7AM, please contact night-coverage [email or web address] Password [facility] 07/26/2019, 6:22 PM

## 2019-07-26 NOTE — Progress Notes (Signed)
Physical Therapy Treatment Patient Details Name: [name] MRN: [id number] DOB: 1930-10-21 Today's Date: 07/26/2019    History of Present Illness Pt admitted 2* weakness and dx with COVID; pt with hx of DM, CHF and dementia    PT Comments    Pt initially refused OOB but she eventually agreed to getting up to bathroom. Once sitting EOB, she declined walking to bathroom but she did agree to get on [facility] (she asked for bedpan). Wheezing noted throughout session. Pt remained on [place] O2-sats 93%. Will continue to follow.     Follow Up Recommendations  SNF;Supervision/Assistance - 24 hour(HHPT if family declines placement)     Equipment Recommendations  None recommended by PT    Recommendations for Other Services       Precautions / Restrictions Precautions Precautions: Fall Restrictions Weight Bearing Restrictions: No    Mobility  Bed Mobility Overal bed mobility: Needs Assistance Bed Mobility: Supine to Sit;Sit to Supine     Supine to sit: Mod assist Sit to supine: Mod assist   General bed mobility comments: Assist for trunk and bil LEs. Pt relied on bedrail. Increased time.  Transfers Overall transfer level: Needs assistance Equipment used: Rolling walker (2 wheeled) Transfers: Sit to/from [facility] Sit to Stand: Mod assist Stand pivot transfers: Mod assist       General transfer comment: Assist to rise, stabilize, control descent. Stand pivot, bed<>bsc, once with RW, once without.  Ambulation/Gait Mod assist Gait Distance (Feet): 3 Feet Assistive device: Rolling walker (2 wheeled)       General Gait Details: Pt would not agree to ambulate in halls 2* report of LE pain.   Stairs             Wheelchair Mobility    Modified Rankin (Stroke Patients Only)       Balance Overall balance assessment: Needs assistance         Standing balance support: Bilateral upper extremity supported Standing balance-Leahy Scale: Poor                              Cognition Arousal/Alertness: Awake/alert Behavior During Therapy: WFL for tasks assessed/performed Overall Cognitive Status: History of cognitive impairments - at baseline                                 General Comments: Pt following most simple cues but with some repetition required - ?[facility]      Exercises      General Comments        Pertinent Vitals/Pain Pain Assessment: Faces Faces Pain Scale: Hurts a little bit Pain Location: legs Pain Intervention(s): Limited activity within patient's tolerance;Repositioned    Home Living                      Prior Function            PT Goals (current goals can now be found in the care plan section) Progress towards PT goals: Progressing toward goals    Frequency    Min 3X/week      PT Plan Current plan remains appropriate    Co-evaluation              AM-PAC PT "6 Clicks" Mobility   Outcome Measure  Help needed turning from your back to your side while in a flat bed without using  bedrails?: A Lot Help needed moving from lying on your back to sitting on the side of a flat bed without using bedrails?: A Lot Help needed moving to and from a bed to a chair (including a wheelchair)?: A Lot Help needed standing up from a chair using your arms (e.g., wheelchair or bedside chair)?: A Lot Help needed to walk in hospital room?: A Lot Help needed climbing 3-5 steps with a railing? : Total 6 Click Score: 11    End of Session   Activity Tolerance: Patient limited by fatigue Patient left: in bed;with call bell/phone within reach;with bed alarm set   PT Visit Diagnosis: Muscle weakness (generalized) (M62.81);Difficulty in walking, not elsewhere classified (R26.2)     Time: [id number] PT Time Calculation (min) (ACUTE ONLY): 17 min  Charges:  $Therapeutic Activity: 8-22 mins                        Doreatha Massed, PT Acute Rehabilitation

## 2019-07-26 NOTE — Progress Notes (Signed)
Patient is currently on 2 liters of oxygen and saturation is 94%, she has expiratory wheezing on ausculation. On call was paged.

## 2019-07-27 DIAGNOSIS — J1282 Pneumonia due to coronavirus disease 2019: Secondary | ICD-10-CM

## 2019-07-27 DIAGNOSIS — I5032 Chronic diastolic (congestive) heart failure: Secondary | ICD-10-CM

## 2019-07-27 DIAGNOSIS — I10 Essential (primary) hypertension: Secondary | ICD-10-CM

## 2019-07-27 LAB — GLUCOSE, CAPILLARY
Glucose-Capillary: 253 mg/dL — ABNORMAL HIGH (ref 70–99)
Glucose-Capillary: 128 mg/dL — ABNORMAL HIGH (ref 70–99)
Glucose-Capillary: 147 mg/dL — ABNORMAL HIGH (ref 70–99)

## 2019-07-27 LAB — BASIC METABOLIC PANEL
Anion gap: 12 (ref 5–15)
BUN: 37 mg/dL — ABNORMAL HIGH (ref 8–23)
CO2: 21 mmol/L — ABNORMAL LOW (ref 22–32)
Calcium: 8.6 mg/dL — ABNORMAL LOW (ref 8.9–10.3)
Chloride: 106 mmol/L (ref 98–111)
Creatinine, Ser: 0.88 mg/dL (ref 0.44–1.00)
GFR calc Af Amer: >60 mL/min (ref >60)
GFR calc non Af Amer: 59 mL/min — ABNORMAL LOW (ref >60)
Glucose, Bld: 263 mg/dL — ABNORMAL HIGH (ref 70–99)
Potassium: 4.4 mmol/L (ref 3.5–5.1)
Sodium: 139 mmol/L (ref 135–145)

## 2019-07-27 LAB — CULTURE, BLOOD (ROUTINE X 2)
Culture: NO GROWTH
Special Requests: ADEQUATE

## 2019-07-27 LAB — D-DIMER, QUANTITATIVE: D-Dimer, Quant: 0.88 ug/mL-FEU — ABNORMAL HIGH (ref 0.00–0.50)

## 2019-07-27 LAB — PROCALCITONIN: Procalcitonin: 0.1 ng/mL

## 2019-07-27 MED ORDER — POTASSIUM CHLORIDE CRYS ER 10 MEQ PO TBCR
10.0000 meq | EXTENDED_RELEASE_TABLET | Freq: Every day | ORAL | Status: DC
  Administered 2019-07-28 – 2019-07-29 (×2): 10 meq via ORAL
  Filled 2019-07-27 (×2): qty 1

## 2019-07-27 MED ORDER — HALOPERIDOL LACTATE 5 MG/ML IJ SOLN
1.0000 mg | Freq: Once | INTRAMUSCULAR | Status: AC
  Administered 2019-07-27: 1 mg via INTRAMUSCULAR
  Filled 2019-07-27: qty 1

## 2019-07-27 MED ORDER — QUETIAPINE FUMARATE 25 MG PO TABS: 25.0000 mg | ORAL_TABLET | Freq: Every evening | ORAL | Status: DC | PRN

## 2019-07-27 MED ORDER — SODIUM CHLORIDE 0.9 % IV SOLN
250.0000 mL | INTRAVENOUS | Status: DC
  Administered 2019-07-27: 250 mL via INTRAVENOUS

## 2019-07-27 MED ORDER — HALOPERIDOL LACTATE 5 MG/ML IJ SOLN
1.0000 mg | Freq: Once | INTRAMUSCULAR | Status: AC
  Administered 2019-07-27: 1 mg via INTRAVENOUS

## 2019-07-27 MED ORDER — GLIPIZIDE ER 5 MG PO TB24
10.0000 mg | ORAL_TABLET | Freq: Every day | ORAL | Status: DC
  Administered 2019-07-28 – 2019-07-29 (×2): 10 mg via ORAL
  Filled 2019-07-27 (×2): qty 2

## 2019-07-27 MED ORDER — HALOPERIDOL LACTATE 5 MG/ML IJ SOLN
INTRAMUSCULAR | Status: AC
  Filled 2019-07-27: qty 1

## 2019-07-27 MED ORDER — IPRATROPIUM BROMIDE HFA 17 MCG/ACT IN AERS
2.0000 | INHALATION_SPRAY | Freq: Four times a day (QID) | RESPIRATORY_TRACT | Status: DC
  Administered 2019-07-28 – 2019-07-29 (×5): 2 via RESPIRATORY_TRACT

## 2019-07-27 MED ORDER — HALOPERIDOL LACTATE 5 MG/ML IJ SOLN
0.5000 mg | Freq: Once | INTRAMUSCULAR | Status: AC
  Administered 2019-07-27: 0.5 mg via INTRAVENOUS
  Filled 2019-07-27: qty 1

## 2019-07-27 MED ORDER — INSULIN GLARGINE 100 UNIT/ML ~~LOC~~ SOLN
30.0000 [IU] | Freq: Two times a day (BID) | SUBCUTANEOUS | Status: DC
  Filled 2019-07-27: qty 0.3

## 2019-07-27 MED ORDER — METFORMIN HCL ER 500 MG PO TB24
500.0000 mg | ORAL_TABLET | Freq: Two times a day (BID) | ORAL | Status: DC
  Administered 2019-07-28 – 2019-07-29 (×3): 500 mg via ORAL
  Filled 2019-07-27 (×3): qty 1

## 2019-07-27 MED ORDER — CARVEDILOL 6.25 MG PO TABS
6.2500 mg | ORAL_TABLET | Freq: Two times a day (BID) | ORAL | Status: DC
  Administered 2019-07-28 – 2019-07-29 (×4): 6.25 mg via ORAL
  Filled 2019-07-27 (×4): qty 1

## 2019-07-27 MED ORDER — HALOPERIDOL LACTATE 5 MG/ML IJ SOLN: 1.0000 mg | Freq: Four times a day (QID) | INTRAMUSCULAR | Status: DC | PRN

## 2019-07-27 NOTE — Progress Notes (Signed)
[facility] PROGRESS NOTE    Progress Note  [name]  [id number] DOB: 30-Aug-1930 DOA: 07/21/2019 PCP: [name], MD     Brief Narrative:   [name] is an 86 y.o. female past medical history significant for dementia, congestive heart failure, diabetes mellitus, essential hypertension was admitted on 07/22/2019 with a temperature of 100.2 due to progressive generalized weakness during this time she was found to be COVID-19 positive, her chest x-ray during that time did not show any infiltrates, patient was unable to ambulate in the ED and she was referred for admission.  Assessment/Plan:   COVID-19 viral infection: I have reviewed the chest x-ray does not show any infiltrates, she has remained afebrile. She has completed her course of IV remdesivir and steroids. She is now requiring 2 L of oxygen to keep saturations greater than 94%. Will wean to room air.  Out of bed to chair. Ffollow strict I's and O's and daily weights. Physical therapy evaluated the patient recommended skilled nursing facility. She is only using oxygen at night probably desaturating from OSA. Physical therapy has evaluated the patient recommended home health PT.  Chronic diastolic heart failure: Continue Coreg amlodipine Lasix and potassium supplementation.  Generalized weakness likely due to COVID-19: Physical therapy evaluated the patient recommended skilled nursing facility, the previous physician spoke to the daughter and they will let the patient discharged home with physical therapy.  Uncontrolled diabetes mellitus type 2 with hyperglycemia: With a known A1c of 9.2, she is on steroids which will make her blood glucose erratic. We will discontinue steroids this am, her Lantus has been held, I will cont to hold. She is currently on long-acting insulin plus Metformin and glipizide despite this her blood glucose remain ranging from 200-300. We will increase her long-acting  insulin  Essential hypertension: Blood pressure is significantly elevated restart his Coreg and his Lasix.  UTI: She was started on Rocephin for possible urinary infection, she has been transitioned to Keflex.  Acute confusional state/Advanced dementia: Getting updates to daughter.   DVT prophylaxis: lovenox Family Communication:Granddaughter Disposition Plan/Barrier to D/C: Hopefully tomorrow morning home with physical therapy. Code Status:     Code Status Orders  (From admission, onward)         Start     Ordered   07/23/19 1708  Full code  Continuous     07/23/19 1721        Code Status History    Date Active Date Inactive Code Status Order ID Comments User Context   07/22/2019 0118 07/23/2019 1721 Full Code [id number]  Phillips Grout, MD ED   04/30/2017 1026 05/08/2017 1841 DNR [id number]  Janece Canterbury, MD ED   02/22/2017 1852 02/28/2017 1644 DNR [id number]  Eugenie Filler, MD Inpatient   09/01/2016 0322 09/02/2016 1605 Full Code [id number]  Karmen Bongo, MD Inpatient   05/15/2016 1724 05/18/2016 1650 Full Code [id number]  Norval Morton, MD Inpatient   Advance Care Planning Activity        IV Access:    Peripheral IV   Procedures and diagnostic studies:   DG Chest 1 View  Result Date: 07/26/2019 CLINICAL DATA:  Shortness of breath. COVID positive. EXAM: CHEST  1 VIEW COMPARISON:  07/21/2019 FINDINGS: 0908 hours. The cardio pericardial silhouette is enlarged. Patchy airspace disease at the left base is minimally progressed in the interval. Right lung clear. Stable asymmetric elevation right hemidiaphragm. The visualized bony structures of the thorax are intact. IMPRESSION: Slight interval progression  of patchy airspace disease at the left base. This may be related to atelectasis or pneumonia. Electronically Signed   By: Misty Stanley M.D.   On: 07/26/2019 09:26     Medical Consultants:    None.  Anti-Infectives:   None  Subjective:    [name] remains confused this morning she relates she has an appetite.  Objective:    Vitals:   07/26/19 0543 07/26/19 0634 07/26/19 1347 07/26/19 2017  BP: (!) 184/78 (!) 150/93 (!) 168/88 (!) 174/68  Pulse: 71 70 70 72  Resp: (!) 24  18 20   Temp: 97.9 F (36.6 C)  98 F (36.7 C) 97.7 F (36.5 C)  TempSrc: Oral  Oral Oral  SpO2: 95%  95% 94%  Weight:      Height:       SpO2: 94 % O2 Flow Rate (L/min): 2 L/min   Intake/Output Summary (Last 24 hours) at 07/27/2019 0808 Last data filed at 07/27/2019 0700 Gross per 24 hour  Intake 1371.46 ml  Output 1800 ml  Net -428.54 ml   Filed Weights   07/23/19 0900  Weight: 84.4 kg    Exam: General exam: In no acute distress. Respiratory system: Good air movement and clear to auscultation. Cardiovascular system: S1 & S2 heard, RRR. No JVD. Gastrointestinal system: Abdomen is nondistended, soft and nontender.  Central nervous system: Alert and oriented. No focal neurological deficits. Extremities: No pedal edema. Skin: No rashes, lesions or ulcers Psychiatry: Judgement and insight appear normal. Mood & affect appropriate.   Data Reviewed:    Labs: Basic Metabolic Panel: Recent Labs  Lab 07/23/19 2032 07/24/19 0730 07/25/19 0201 07/25/19 2005 07/26/19 0517 07/27/19 0414  NA  --  136 136 135 138 139  K  --  3.6 3.8 4.1 4.0 4.4  CL  --  104 104 107 110 106  CO2  --  24 20* 18* 20* 21*  GLUCOSE  --  200* 298* 336* 159* 263*  BUN  --  46* 48* 45* 37* 37*  CREATININE 1.15* 0.72 1.04* 0.93 0.87 0.88  CALCIUM  --  7.9* 7.7* 7.7* 7.7* 8.6*  PHOS 2.2*  --   --   --   --   --    GFR Estimated Creatinine Clearance: 49.3 mL/min (by C-G formula based on SCr of 0.88 mg/dL). Liver Function Tests: Recent Labs  Lab 07/21/19 2220 07/23/19 1247 07/24/19 0038  AST 22 39 39  ALT 23 28 29   ALKPHOS 72 65 58  BILITOT 0.8 0.6 0.4  PROT 8.0 7.5 6.9  ALBUMIN 3.5 3.0* 2.8*   No results for input(s): LIPASE, AMYLASE in the last  168 hours. No results for input(s): AMMONIA in the last 168 hours. Coagulation profile Recent Labs  Lab 07/21/19 2220  INR 1.0   COVID-19 Labs  No results for input(s): DDIMER, FERRITIN, LDH, CRP in the last 72 hours.  No results found for: SARSCOV2NAA  CBC: Recent Labs  Lab 07/21/19 2220 07/23/19 1247 07/23/19 2032 07/24/19 0038  WBC 6.3 8.1 7.0 7.4  NEUTROABS 3.8 6.5 5.3 5.5  HGB 14.3 14.5 14.1 13.5  HCT 43.3 44.8 42.9 41.0  MCV 88.2 90.1 87.7 87.0  PLT 211 214 211 205   Cardiac Enzymes: No results for input(s): CKTOTAL, CKMB, CKMBINDEX, TROPONINI in the last 168 hours. BNP (last 3 results) No results for input(s): PROBNP in the last 8760 hours. CBG: Recent Labs  Lab 07/26/19 0724 07/26/19 1110 07/26/19 1759 07/26/19 2206  07/27/19 0418  GLUCAP 112* 122* 294* 318* 253*   D-Dimer: No results for input(s): DDIMER in the last 72 hours. Hgb A1c: No results for input(s): HGBA1C in the last 72 hours. Lipid Profile: No results for input(s): CHOL, HDL, LDLCALC, TRIG, CHOLHDL, LDLDIRECT in the last 72 hours. Thyroid function studies: No results for input(s): TSH, T4TOTAL, T3FREE, THYROIDAB in the last 72 hours.  Invalid input(s): FREET3 Anemia work up: No results for input(s): VITAMINB12, FOLATE, FERRITIN, TIBC, IRON, RETICCTPCT in the last 72 hours. Sepsis Labs: Recent Labs  Lab 07/21/19 2220 07/23/19 1247 07/23/19 2032 07/24/19 0038  WBC 6.3 8.1 7.0 7.4  LATICACIDVEN 1.3  --   --   --    Microbiology Recent Results (from the past 240 hour(s))  Blood Culture (routine x 2)     Status: None (Preliminary result)   Collection Time: 07/21/19 10:21 PM   Specimen: BLOOD  Result Value Ref Range Status   Specimen Description   Final    BLOOD SITE NOT SPECIFIED Performed at [facility], [address] [place], [place] [address]    Special Requests   Final    BOTTLES DRAWN AEROBIC AND ANAEROBIC Blood Culture adequate volume Performed at [place] [address], [place], [place] [address]    Culture   Final    NO GROWTH 4 DAYS Performed at [facility], [place] [address], [place], [place] [address]    Report Status PENDING  Incomplete  Urine culture     Status: Abnormal   Collection Time: 07/21/19 10:21 PM   Specimen: In/Out Cath Urine  Result Value Ref Range Status   Specimen Description   Final    IN/OUT CATH URINE Performed at [place] [address], [place], [place] [address]    Special Requests   Final    NONE Performed at [facility], [place] [address], [place], [place] [address]    Culture >=100,000 COLONIES/mL ESCHERICHIA COLI (A)  Final   Report Status 07/24/2019 FINAL  Final   Organism ID, Bacteria ESCHERICHIA COLI (A)  Final      Susceptibility   Escherichia coli - MIC*    AMPICILLIN 8 SENSITIVE Sensitive     CEFAZOLIN <=4 SENSITIVE Sensitive     CEFTRIAXONE <=0.25 SENSITIVE Sensitive     CIPROFLOXACIN <=0.25 SENSITIVE Sensitive     GENTAMICIN <=1 SENSITIVE Sensitive     IMIPENEM <=0.25 SENSITIVE Sensitive     NITROFURANTOIN <=16 SENSITIVE Sensitive     TRIMETH/SULFA <=20 SENSITIVE Sensitive     AMPICILLIN/SULBACTAM 4 SENSITIVE Sensitive     PIP/TAZO <=4 SENSITIVE Sensitive     * >=100,000 COLONIES/mL ESCHERICHIA COLI  MRSA PCR Screening     Status: None   Collection Time: 07/23/19 10:08 PM   Specimen: Nasal Mucosa; Nasopharyngeal  Result Value Ref Range Status   MRSA by PCR NEGATIVE NEGATIVE Final    Comment:        The GeneXpert MRSA Assay (FDA approved for NASAL specimens only), is one component of a comprehensive MRSA colonization surveillance program. It is not intended to diagnose MRSA infection nor to guide or monitor treatment for MRSA infections. Performed at [facility], [place] [address], [place], [place] [address]      Medications:   . albuterol  2 puff Inhalation Q6H  . ALPRAZolam  0.5 mg Oral  BID  . amLODipine  10 mg Oral q morning - 10a  .  vitamin C  500 mg Oral Daily  . benzonatate  200 mg Oral TID  . budesonide  1 puff Inhalation BID  . carvedilol  12.5 mg Oral BID WC  . cephALEXin  500 mg Oral Q8H  . Chlorhexidine Gluconate Cloth  6 each Topical Daily  . dexamethasone (DECADRON) injection  6 mg Intravenous Daily  . docusate sodium  100 mg Oral BID  . enoxaparin (LOVENOX) injection  40 mg Subcutaneous Q24H  . [START ON 07/28/2019] furosemide  20 mg Oral Daily  . insulin aspart  0-15 Units Subcutaneous TID WC  . insulin aspart  5 Units Subcutaneous TID WC  . insulin glargine  40 Units Subcutaneous QHS  . ipratropium  2 puff Inhalation Q6H  . mouth rinse  15 mL Mouth Rinse BID  . sodium chloride flush  3 mL Intravenous Q12H  . zinc sulfate  220 mg Oral Daily   Continuous Infusions: . sodium chloride 250 mL (07/27/19 0252)       LOS: 4 days   Charlynne Cousins  [facility]  07/27/2019, 8:08 AM

## 2019-07-27 NOTE — Progress Notes (Signed)
Sitter at bedside. Pt sitting up in bed watching TV. No acute distress noted. Pt continues to refuse po medications.

## 2019-07-27 NOTE — Progress Notes (Signed)
No change in patients agitation and aggressive behavior. MD aware.

## 2019-07-27 NOTE — Progress Notes (Signed)
Pt. Continues to refuse all treatment. Confused, agitated, and combative. No IV access. BP elevated. Floor coverage notified. IM Haldol ordered.

## 2019-07-27 NOTE — Progress Notes (Signed)
Pt remains combative. Refuses CBG at this time. Swinging at staff. Patient continues to attempt getting out of bed. Fall education reinforced. Patient oriented to person, and place. This nurse at bedside. Will continue to monitor.

## 2019-07-27 NOTE — Progress Notes (Signed)
Pt.'s HS blood sugar 318. Floor coverage notified. 5 units of novolog ordered plus 40 units of Lantus. RN unable to give insulin at this time due to patient refusing. Pt. Very agitated and combative. Benadryl administered IV with the assist of another RN. RN continued to ask patient if she would allow RN to give the Insulin but patient continues to refuse. Floor coverage notified. Fluids ordered. Patient refused VS at this time. Continues to be agitated and combative.

## 2019-07-27 NOTE — TOC Progression Note (Addendum)
Transition of Care [facility]) - Progression Note    Patient Details  Name: [name] MRN: [id number] Date of Birth: 06-06-1931  [facility]) CM/SW Contact  Yuvonne Lanahan, [place], [place] Phone Number: 07/27/2019, 5:37 PM  Clinical Narrative:    CSW spoke to patient's granddaughter [name], and she is concerned that patient is too weak to return back home but she wants to see how patient is doing tomorrow then decide if she wants patient to go to SNF.  Patient's granddaughter requested that bedside nurse call her husband [name] to given an update on how patient is doing.  CSW was given permission to begin bed search for SNF in case they would rather patient go to SNF. Patient's granddaughter requested CSW to call her back in the morning.   Expected Discharge Plan and Services    SNF verse home with home health.                             Social Determinants of Health (SDOH) Interventions    Readmission Risk Interventions No flowsheet data found.

## 2019-07-27 NOTE — Progress Notes (Signed)
Pt resting quietly in bed watching TV. No acute distress noted. Sitter at bedside. Pt refused VS monitoring.

## 2019-07-27 NOTE — Progress Notes (Signed)
Patient remains combative with staff. Refuses RT treatment. This nurse at bedside.  Awaiting sitter to remain with patient.

## 2019-07-27 NOTE — Care Management Important Message (Signed)
Important Message  Patient Details IM Letter given to Evette Cristal SW Case Manager to present to the Patient Name: [name] MRN: [id number] Date of Birth: 05/28/1931   Medicare Important Message Given:  Yes     Kerin Salen 07/27/2019, 11:42 AM

## 2019-07-27 NOTE — Progress Notes (Signed)
Rt went to do assessment on pt to see if pt still needs inhalers. Pt would not let me assess her. Pt is very agitated and confused.

## 2019-07-28 LAB — GLUCOSE, CAPILLARY
Glucose-Capillary: 138 mg/dL — ABNORMAL HIGH (ref 70–99)
Glucose-Capillary: 224 mg/dL — ABNORMAL HIGH (ref 70–99)
Glucose-Capillary: 80 mg/dL (ref 70–99)
Glucose-Capillary: 135 mg/dL — ABNORMAL HIGH (ref 70–99)

## 2019-07-28 LAB — PROCALCITONIN: Procalcitonin: <0.1 ng/mL

## 2019-07-28 LAB — BASIC METABOLIC PANEL
Anion gap: 11 (ref 5–15)
BUN: 33 mg/dL — ABNORMAL HIGH (ref 8–23)
CO2: 20 mmol/L — ABNORMAL LOW (ref 22–32)
Calcium: 8.3 mg/dL — ABNORMAL LOW (ref 8.9–10.3)
Chloride: 109 mmol/L (ref 98–111)
Creatinine, Ser: 0.82 mg/dL (ref 0.44–1.00)
GFR calc Af Amer: >60 mL/min (ref >60)
GFR calc non Af Amer: >60 mL/min (ref >60)
Glucose, Bld: 221 mg/dL — ABNORMAL HIGH (ref 70–99)
Potassium: 4.1 mmol/L (ref 3.5–5.1)
Sodium: 140 mmol/L (ref 135–145)

## 2019-07-28 LAB — D-DIMER, QUANTITATIVE: D-Dimer, Quant: 0.85 ug/mL-FEU — ABNORMAL HIGH (ref 0.00–0.50)

## 2019-07-28 LAB — C-REACTIVE PROTEIN: CRP: 1.2 mg/dL — ABNORMAL HIGH (ref <1.0)

## 2019-07-28 MED ORDER — HYDRALAZINE HCL 50 MG PO TABS
50.0000 mg | ORAL_TABLET | Freq: Once | ORAL | Status: AC
  Administered 2019-07-28: 50 mg via ORAL
  Filled 2019-07-28: qty 1

## 2019-07-28 NOTE — NC FL2 (Signed)
[place] LEVEL OF CARE SCREENING TOOL     IDENTIFICATION  Patient Name: [name] Birthdate: 1930/11/18 Sex: female Admission Date (Current Location): 07/21/2019  [facility] and [place] Number:  Herbalist and Address:  [facility],  [place], [place]      Provider Number: [id number]  Attending Physician Name and Address:  Charlynne Cousins, MD  Relative Name and Phone Number:  Jaynie Bream Granddaughter [phone]    Current Level of Care: Hospital Recommended Level of Care: [place] Prior Approval Number:    Date Approved/Denied:   PASRR Number: [id number]  Discharge Plan: SNF    Current Diagnoses: Patient Active Problem List   Diagnosis Date Noted  . Pneumonia due to COVID-19 virus 07/22/2019  . Acute on chronic diastolic CHF (congestive heart failure) ([place]) 05/06/2017  . Altered mental status   . Acute respiratory failure ([place]) 05/04/2017  . Hypoglycemia 04/30/2017  . Acute urinary retention 02/23/2017  . Chronic diastolic CHF (congestive heart failure) ([place])   . SBO (small bowel obstruction) ([place]) 02/22/2017  . Hypertensive urgency 02/22/2017  . Leukocytosis 02/22/2017  . Hyponatremia 02/22/2017  . Dehydration 02/22/2017  . Dizziness 09/01/2016  . Weakness 09/01/2016  . Psoriasis vulgaris 09/01/2016  . Right hip pain 09/01/2016  . Diabetic foot ulcer ([place]) 09/01/2016  . Dyspnea 05/15/2016  . Congestive heart failure (CHF) ([place]) 05/15/2016  . Rash and nonspecific skin eruption 05/15/2016  . Essential hypertension 05/15/2016  . Uncontrolled type 2 diabetes mellitus with hyperglycemia ([place]) 05/15/2016  . Anxiety 05/15/2016  . Acute respiratory failure with hypoxia ([place]) 05/15/2016    Orientation RESPIRATION BLADDER Height & Weight     Self  Normal Incontinent Weight: 186 lb (84.4 kg) Height:  5\' 5"  (165.1 cm)  BEHAVIORAL SYMPTOMS/MOOD NEUROLOGICAL BOWEL NUTRITION STATUS    Incontinent Diet(Carb Modified)  AMBULATORY STATUS COMMUNICATION OF NEEDS Skin   Limited Assist Verbally Surgical wounds                       Personal Care Assistance Level of Assistance  Bathing, Feeding, Dressing Bathing Assistance: Limited assistance Feeding assistance: Limited assistance Dressing Assistance: Limited assistance     Functional Limitations Info  Sight, Speech, Hearing Sight Info: Adequate Hearing Info: Adequate Speech Info: Adequate    SPECIAL CARE FACTORS FREQUENCY  PT (By licensed PT), OT (By licensed OT)     PT Frequency: minimum 5x a week OT Frequency: minimum 5x a week            Contractures Contractures Info: Not present    Additional Factors Info  Code Status, Allergies, Psychotropic, Insulin Sliding Scale, Isolation Precautions Code Status Info: Full Code Allergies Info: Ace Inhibitors Glipizide Neosporin Spironolactone Diovan Psychotropic Info: ALPRAZolam (XANAX) tablet 0.5 mg Insulin Sliding Scale Info: insulin aspart (novoLOG) injection 0-15 Units 3x a day with meals Isolation Precautions Info: Covid Positive Air/Con Pre     Current Medications (07/28/2019):  This is the current hospital active medication list Current Facility-Administered Medications  Medication Dose Route Frequency Provider Last Rate Last Admin  . acetaminophen (TYLENOL) tablet 650 mg  650 mg Oral Q6H PRN Derrill Kay A, MD      . albuterol (VENTOLIN HFA) 108 (90 Base) MCG/ACT inhaler 2 puff  2 puff Inhalation Q6H PRN Opyd, Ilene Qua, MD   2 puff at 07/26/19 0601  . albuterol (VENTOLIN HFA) 108 (90 Base) MCG/ACT inhaler 2 puff  2 puff Inhalation  Q6H Cristescu, Linard Millers, MD   2 puff at 07/28/19 0856  . ALPRAZolam Duanne Moron) tablet 0.5 mg  0.5 mg Oral BID Cristal Deer, MD   0.5 mg at 07/28/19 0854  . amLODipine (NORVASC) tablet 10 mg  10 mg Oral q morning - 10a Phillips Grout, MD   10 mg at 07/28/19 0854  . ascorbic acid (VITAMIN C) tablet 500 mg  500 mg Oral  Daily Derrill Kay A, MD   500 mg at 07/28/19 0855  . benzonatate (TESSALON) capsule 200 mg  200 mg Oral TID Terrilee Croak, MD   200 mg at 07/28/19 0853  . budesonide (PULMICORT) 180 MCG/ACT inhaler 1 puff  1 puff Inhalation BID Cristescu, Linard Millers, MD   1 puff at 07/28/19 0856  . carvedilol (COREG) tablet 6.25 mg  6.25 mg Oral BID WC Charlynne Cousins, MD   6.25 mg at 07/28/19 0855  . cephALEXin (KEFLEX) capsule 500 mg  500 mg Oral Q8H Cristescu, Mircea G, MD   500 mg at 07/26/19 1407  . Chlorhexidine Gluconate Cloth 2 % PADS 6 each  6 each Topical Daily Cristal Deer, MD   6 each at 07/25/19 1533  . chlorpheniramine-HYDROcodone (TUSSIONEX) 10-8 MG/5ML suspension 5 mL  5 mL Oral Q12H PRN Derrill Kay A, MD      . dextrose 50 % solution 0-50 mL  0-50 mL Intravenous PRN Cristal Deer, MD      . docusate sodium (COLACE) capsule 100 mg  100 mg Oral BID Terrilee Croak, MD   100 mg at 07/28/19 0853  . enoxaparin (LOVENOX) injection 40 mg  40 mg Subcutaneous Q24H Cristal Deer, MD   40 mg at 07/25/19 2122  . furosemide (LASIX) tablet 20 mg  20 mg Oral Daily Cristescu, Linard Millers, MD   20 mg at 07/28/19 0854  . glipiZIDE (GLUCOTROL XL) 24 hr tablet 10 mg  10 mg Oral QAC breakfast Charlynne Cousins, MD   10 mg at 07/28/19 0855  . guaiFENesin-dextromethorphan (ROBITUSSIN DM) 100-10 MG/5ML syrup 10 mL  10 mL Oral Q4H PRN Derrill Kay A, MD      . haloperidol lactate (HALDOL) injection 1 mg  1 mg Intravenous Q6H PRN Charlynne Cousins, MD      . hydrALAZINE (APRESOLINE) injection 10 mg  10 mg Intravenous Q6H PRN Dahal, Marlowe Aschoff, MD   10 mg at 07/25/19 1750  . insulin aspart (novoLOG) injection 0-15 Units  0-15 Units Subcutaneous TID WC Cristescu, Linard Millers, MD   8 Units at 07/26/19 1731  . insulin aspart (novoLOG) injection 5 Units  5 Units Subcutaneous TID WC Cristal Deer, MD   5 Units at 07/26/19 1154  . ipratropium (ATROVENT HFA) inhaler 2 puff  2 puff Inhalation Q6H Cristescu, Linard Millers,  MD   2 puff at 07/28/19 0857  . labetalol (NORMODYNE) injection 10 mg  10 mg Intravenous Q4H PRN Cristescu, Linard Millers, MD   10 mg at 07/25/19 2122  . MEDLINE mouth rinse  15 mL Mouth Rinse BID Cristal Deer, MD   15 mL at 07/25/19 2128  . metFORMIN (GLUCOPHAGE-XR) 24 hr tablet 500 mg  500 mg Oral BID WC Charlynne Cousins, MD   500 mg at 07/28/19 0854  . mirtazapine (REMERON) tablet 15 mg  15 mg Oral QHS PRN Dahal, Marlowe Aschoff, MD      . ondansetron (ZOFRAN) tablet 4 mg  4 mg Oral Q6H PRN Phillips Grout, MD  Or  . ondansetron (ZOFRAN) injection 4 mg  4 mg Intravenous Q6H PRN Derrill Kay A, MD      . potassium chloride (KLOR-CON) CR tablet 10 mEq  10 mEq Oral Daily Charlynne Cousins, MD   10 mEq at 07/28/19 0904  . QUEtiapine (SEROQUEL) tablet 25 mg  25 mg Oral QHS PRN Charlynne Cousins, MD      . zinc sulfate capsule 220 mg  220 mg Oral Daily Phillips Grout, MD   220 mg at 07/28/19 [id number]     Discharge Medications: Please see discharge summary for a list of discharge medications.  Relevant Imaging Results:  Relevant Lab Results:   Additional Information SSN [id number]  Ross Ludwig, LCSW

## 2019-07-28 NOTE — Progress Notes (Signed)
Sitter remains at bedside monitoring patient. Patient cooperative with lab tech for blood specimen collection attempt. Pt ate some of her breakfast. No acute distress noted. Will continue to monitor and document daily assessment.

## 2019-07-28 NOTE — Discharge Summary (Addendum)
Physician Discharge Summary  [name] [id number] DOB: May 02, 1931 DOA: 07/21/2019  PCP: [name], MD  Admit date: 07/21/2019 Discharge date: 07/29/2019  Admitted From: Home Disposition: Skilled nursing facility  Recommendations for Outpatient Follow-up:  1. Follow up with PCP in 1-2 weeks 2.   Home Health:yes Equipment/Devices:None  Discharge Condition:Stable CODE STATUS:Full Diet recommendation: Heart Healthy  Brief/Interim Summary: 86 y.o. female past medical history significant for dementia, congestive heart failure, diabetes mellitus, essential hypertension was admitted on 07/22/2019 with a temperature of 100.2 due to progressive generalized weakness during this time she was found to be COVID-19 positive, her chest x-ray during that time did not show any infiltrates, patient was unable to ambulate in the ED and she was referred for admission.  Discharge Diagnoses:  Principal Problem:   Pneumonia due to COVID-19 virus Active Problems:   Essential hypertension   Weakness   Chronic diastolic CHF (congestive heart failure) ([place]) COVID-19 viral pneumonia: Chest x-ray did show infiltrates despite this she defervesced, and was not hypoxic, she did desat overnight likely due to obstructive sleep apnea. She has completed a course of IV remdesivir and steroids in house, physical therapy evaluated the patient recommended skilled nursing facility Granddaughter agreed to send the patient to skilled nursing facility.  Chronic diastolic heart failure: No changes made to her medication.  Generalized weakness likely due to COVID-19:  Physical therapy has evaluated the patient and recommended skilled nursing facility, the granddaughter has refused she she relates she would like home.  Uncontrolled diabetes mellitus type 2 with hyperglycemia: With an A1c of 9.2, no further changes made to her medication as an outpatient.  Essential hypertension: No changes made to her  medication.  Possible UTI: Urine cultures remain undetermined she was treated empirically with a 5-day course of antibiotics.   Discharge Instructions  Discharge Instructions    Diet - low sodium heart healthy   Complete by: As directed    Increase activity slowly   Complete by: As directed    MyChart COVID-19 home monitoring program   Complete by: Jul 28, 2019    Is the patient willing to use the [place] for home monitoring?: No     Allergies as of 07/28/2019      Reactions   Ace Inhibitors Dermatitis   Glipizide    rash   Neosporin [neomycin-bacitracin Zn-polymyx] Dermatitis   Spironolactone    rash   Diovan [valsartan] Rash      Medication List    STOP taking these medications   cetirizine 10 MG tablet Commonly known as: ZYRTEC   diclofenac sodium 1 % Gel Commonly known as: VOLTAREN   ondansetron 4 MG disintegrating tablet Commonly known as: Zofran ODT     TAKE these medications   ALPRAZolam 0.5 MG tablet Commonly known as: XANAX Take 1 tablet (0.5 mg total) by mouth 3 (three) times daily. What changed:   when to take this  reasons to take this   amLODipine 10 MG tablet Commonly known as: NORVASC Take 10 mg by mouth every morning.   BIOTIN PO Take 1 tablet by mouth daily.   carvedilol 6.25 MG tablet Commonly known as: COREG Take 1 tablet (6.25 mg total) by mouth 2 (two) times daily with a meal.   docusate sodium 100 MG capsule Commonly known as: COLACE Take 1 capsule (100 mg total) by mouth 2 (two) times daily.   furosemide 40 MG tablet Commonly known as: LASIX Take 1 tablet (40 mg total) by  mouth every morning. What changed: Another medication with the same name was removed. Continue taking this medication, and follow the directions you see here.   glipiZIDE 10 MG 24 hr tablet Commonly known as: GLUCOTROL XL Take 1 tablet by mouth daily.   HumaLOG KwikPen 200 UNIT/ML Sopn Generic drug: Insulin Lispro Inject 0-15 Units into  the skin 3 (three) times daily with meals. CBG 70-120--no units; 121-150--2 units; 151-200--3 units; 201-250--5 units; 251-300--8 units; 301-350--11 units; 351-400--15 units   insulin glargine 100 UNIT/ML injection Commonly known as: LANTUS Inject 0.2 mLs (20 Units total) into the skin at bedtime. What changed: how much to take   metFORMIN 500 MG 24 hr tablet Commonly known as: GLUCOPHAGE-XR Take 500 mg by mouth 2 (two) times daily.   multivitamin tablet Take 1 tablet by mouth daily.   multivitamin-lutein Caps capsule Take 1 capsule by mouth daily.   potassium chloride 10 MEQ tablet Commonly known as: KLOR-CON Take 1 tablet (10 mEq total) by mouth daily.   prednisoLONE acetate 1 % ophthalmic suspension Commonly known as: PRED FORTE Place 1 drop into both eyes 4 (four) times daily.   triamcinolone cream 0.1 % Commonly known as: KENALOG APPLY TWICE DAILY            Durable Medical Equipment  (From admission, onward)         Start     Ordered   07/26/19 0659  DME Oxygen  (Discharge Planning)  Once    Question Answer Comment  Length of Need 6 Months   Mode or (Route) Nasal cannula   Liters per Minute 2   Oxygen delivery system Gas      07/26/19 0658          Allergies  Allergen Reactions  . Ace Inhibitors Dermatitis  . Glipizide     rash  . Neosporin [Neomycin-Bacitracin Zn-Polymyx] Dermatitis  . Spironolactone     rash  . Diovan [Valsartan] Rash    Consultations:  None   Procedures/Studies: DG Chest 1 View  Result Date: 07/26/2019 CLINICAL DATA:  Shortness of breath. COVID positive. EXAM: CHEST  1 VIEW COMPARISON:  07/21/2019 FINDINGS: 0908 hours. The cardio pericardial silhouette is enlarged. Patchy airspace disease at the left base is minimally progressed in the interval. Right lung clear. Stable asymmetric elevation right hemidiaphragm. The visualized bony structures of the thorax are intact. IMPRESSION: Slight interval progression of patchy  airspace disease at the left base. This may be related to atelectasis or pneumonia. Electronically Signed   By: Misty Stanley M.D.   On: 07/26/2019 09:26   DG Chest Port 1 View  Result Date: 07/21/2019 CLINICAL DATA:  Shortness of breath and fatigue. EXAM: PORTABLE CHEST 1 VIEW COMPARISON:  May 04, 2017 FINDINGS: There is no evidence of acute infiltrate, pleural effusion or pneumothorax. The cardiac silhouette is mildly enlarged. Multilevel degenerative changes seen throughout the thoracic spine. IMPRESSION: 1. No acute or active cardiopulmonary disease. Electronically Signed   By: Virgina Norfolk M.D.   On: 07/21/2019 22:22    Subjective: No complaints.  Discharge Exam: Vitals:   07/27/19 1535 07/27/19 2051  BP: (!) 171/133 (!) 194/81  Pulse: 90 92  Resp:  18  Temp:  98.7 F (37.1 C)  SpO2: 94% 97%   Vitals:   07/26/19 1347 07/26/19 2017 07/27/19 1535 07/27/19 2051  BP: (!) 168/88 (!) 174/68 (!) 171/133 (!) 194/81  Pulse: 70 72 90 92  Resp: 18 20  18   Temp: 98 F (36.7  C) 97.7 F (36.5 C)  98.7 F (37.1 C)  TempSrc: Oral Oral  Oral  SpO2: 95% 94% 94% 97%  Weight:      Height:        General: Pt is alert, awake, not in acute distress Cardiovascular: RRR, S1/S2 +, no rubs, no gallops Respiratory: CTA bilaterally, no wheezing, no rhonchi Abdominal: Soft, NT, ND, bowel sounds + Extremities: no edema, no cyanosis    The results of significant diagnostics from this hospitalization (including imaging, microbiology, ancillary and laboratory) are listed below for reference.     Microbiology: Recent Results (from the past 240 hour(s))  Blood Culture (routine x 2)     Status: None   Collection Time: 07/21/19 10:21 PM   Specimen: BLOOD  Result Value Ref Range Status   Specimen Description   Final    BLOOD SITE NOT SPECIFIED Performed at [facility], [address], [place], [place] [address]    Special Requests   Final    BOTTLES DRAWN AEROBIC AND ANAEROBIC  Blood Culture adequate volume Performed at [facility] [address], [place], [place] [address]    Culture   Final    NO GROWTH 5 DAYS Performed at [facility], [place] [address] [place], [place] [address]    Report Status 07/27/2019 FINAL  Final  Urine culture     Status: Abnormal   Collection Time: 07/21/19 10:21 PM   Specimen: In/Out Cath Urine  Result Value Ref Range Status   Specimen Description   Final    IN/OUT CATH URINE Performed at [facility] [address] [place], [place] [address]    Special Requests   Final    NONE Performed at [facility], [place] [address], [place], [place] [address]    Culture >=100,000 COLONIES/mL ESCHERICHIA COLI (A)  Final   Report Status 07/24/2019 FINAL  Final   Organism ID, Bacteria ESCHERICHIA COLI (A)  Final      Susceptibility   Escherichia coli - MIC*    AMPICILLIN 8 SENSITIVE Sensitive     CEFAZOLIN <=4 SENSITIVE Sensitive     CEFTRIAXONE <=0.25 SENSITIVE Sensitive     CIPROFLOXACIN <=0.25 SENSITIVE Sensitive     GENTAMICIN <=1 SENSITIVE Sensitive     IMIPENEM <=0.25 SENSITIVE Sensitive     NITROFURANTOIN <=16 SENSITIVE Sensitive     TRIMETH/SULFA <=20 SENSITIVE Sensitive     AMPICILLIN/SULBACTAM 4 SENSITIVE Sensitive     PIP/TAZO <=4 SENSITIVE Sensitive     * >=100,000 COLONIES/mL ESCHERICHIA COLI  MRSA PCR Screening     Status: None   Collection Time: 07/23/19 10:08 PM   Specimen: Nasal Mucosa; Nasopharyngeal  Result Value Ref Range Status   MRSA by PCR NEGATIVE NEGATIVE Final    Comment:        The GeneXpert MRSA Assay (FDA approved for NASAL specimens only), is one component of a comprehensive MRSA colonization surveillance program. It is not intended to diagnose MRSA infection nor to guide or monitor treatment for MRSA infections. Performed at [facility], [place] [address] [place], [place] [address]      Labs: BNP (last 3  results) Recent Labs    07/21/19 2220  BNP [id number]*   Basic Metabolic Panel: Recent Labs  Lab 07/23/19 2032 07/24/19 0038 07/24/19 0730 07/25/19 0201 07/25/19 2005 07/26/19 0517 07/27/19 0414  NA  --    < > 136 136 135 138 139  K  --    < >  3.6 3.8 4.1 4.0 4.4  CL  --    < > 104 104 107 110 106  CO2  --    < > 24 20* 18* 20* 21*  GLUCOSE  --    < > 200* 298* 336* 159* 263*  BUN  --    < > 46* 48* 45* 37* 37*  CREATININE 1.15*   < > 0.72 1.04* 0.93 0.87 0.88  CALCIUM  --    < > 7.9* 7.7* 7.7* 7.7* 8.6*  PHOS 2.2*  --   --   --   --   --   --    < > = values in this interval not displayed.   Liver Function Tests: Recent Labs  Lab 07/21/19 2220 07/23/19 1247 07/24/19 0038  AST 22 39 39  ALT 23 28 29   ALKPHOS 72 65 58  BILITOT 0.8 0.6 0.4  PROT 8.0 7.5 6.9  ALBUMIN 3.5 3.0* 2.8*   No results for input(s): LIPASE, AMYLASE in the last 168 hours. No results for input(s): AMMONIA in the last 168 hours. CBC: Recent Labs  Lab 07/21/19 2220 07/23/19 1247 07/23/19 2032 07/24/19 0038  WBC 6.3 8.1 7.0 7.4  NEUTROABS 3.8 6.5 5.3 5.5  HGB 14.3 14.5 14.1 13.5  HCT 43.3 44.8 42.9 41.0  MCV 88.2 90.1 87.7 87.0  PLT 211 214 211 205   Cardiac Enzymes: No results for input(s): CKTOTAL, CKMB, CKMBINDEX, TROPONINI in the last 168 hours. BNP: Invalid input(s): POCBNP CBG: Recent Labs  Lab 07/26/19 1759 07/26/19 2206 07/27/19 0418 07/27/19 0852 07/27/19 2103  GLUCAP 294* 318* 253* 128* 147*   D-Dimer Recent Labs    07/27/19 0845  DDIMER 0.88*   Hgb A1c No results for input(s): HGBA1C in the last 72 hours. Lipid Profile No results for input(s): CHOL, HDL, LDLCALC, TRIG, CHOLHDL, LDLDIRECT in the last 72 hours. Thyroid function studies No results for input(s): TSH, T4TOTAL, T3FREE, THYROIDAB in the last 72 hours.  Invalid input(s): FREET3 Anemia work up No results for input(s): VITAMINB12, FOLATE, FERRITIN, TIBC, IRON, RETICCTPCT in the last 72  hours. Urinalysis    Component Value Date/Time   COLORURINE YELLOW 07/24/2019 1437   APPEARANCEUR TURBID (A) 07/24/2019 1437   LABSPEC 1.025 07/24/2019 1437   PHURINE 5.5 07/24/2019 1437   GLUCOSEU >=500 (A) 07/24/2019 1437   HGBUR SMALL (A) 07/24/2019 1437   BILIRUBINUR NEGATIVE 07/24/2019 1437   KETONESUR NEGATIVE 07/24/2019 1437   PROTEINUR 100 (A) 07/24/2019 1437   UROBILINOGEN 0.2 08/16/2010 2011   NITRITE POSITIVE (A) 07/24/2019 1437   LEUKOCYTESUR MODERATE (A) 07/24/2019 1437   Sepsis Labs Invalid input(s): PROCALCITONIN,  WBC,  LACTICIDVEN Microbiology Recent Results (from the past 240 hour(s))  Blood Culture (routine x 2)     Status: None   Collection Time: 07/21/19 10:21 PM   Specimen: BLOOD  Result Value Ref Range Status   Specimen Description   Final    BLOOD SITE NOT SPECIFIED Performed at [facility], [address], [place], [place] [address]    Special Requests   Final    BOTTLES DRAWN AEROBIC AND ANAEROBIC Blood Culture adequate volume Performed at [facility] [address] [place], [place] [address]    Culture   Final    NO GROWTH 5 DAYS Performed at [facility], [place] [address] [place],  [address]    Report Status 07/27/2019 FINAL  Final  Urine culture     Status: Abnormal  Collection Time: 07/21/19 10:21 PM   Specimen: In/Out Cath Urine  Result Value Ref Range Status   Specimen Description   Final    IN/OUT CATH URINE Performed at [facility], [place] [address], [place], [place] [address]    Special Requests   Final    NONE Performed at [facility], [place] [address], [place], [place] [address]    Culture >=100,000 COLONIES/mL ESCHERICHIA COLI (A)  Final   Report Status 07/24/2019 FINAL  Final   Organism ID, Bacteria ESCHERICHIA COLI (A)  Final      Susceptibility   Escherichia coli - MIC*    AMPICILLIN 8 SENSITIVE Sensitive     CEFAZOLIN <=4 SENSITIVE Sensitive      CEFTRIAXONE <=0.25 SENSITIVE Sensitive     CIPROFLOXACIN <=0.25 SENSITIVE Sensitive     GENTAMICIN <=1 SENSITIVE Sensitive     IMIPENEM <=0.25 SENSITIVE Sensitive     NITROFURANTOIN <=16 SENSITIVE Sensitive     TRIMETH/SULFA <=20 SENSITIVE Sensitive     AMPICILLIN/SULBACTAM 4 SENSITIVE Sensitive     PIP/TAZO <=4 SENSITIVE Sensitive     * >=100,000 COLONIES/mL ESCHERICHIA COLI  MRSA PCR Screening     Status: None   Collection Time: 07/23/19 10:08 PM   Specimen: Nasal Mucosa; Nasopharyngeal  Result Value Ref Range Status   MRSA by PCR NEGATIVE NEGATIVE Final    Comment:        The GeneXpert MRSA Assay (FDA approved for NASAL specimens only), is one component of a comprehensive MRSA colonization surveillance program. It is not intended to diagnose MRSA infection nor to guide or monitor treatment for MRSA infections. Performed at [facility], [place] [address] [place], [place] [address]      Time coordinating discharge: Over 40 minutes  SIGNED:   Charlynne Cousins, MD  [facility] 07/28/2019, 8:14 AM Pager   If 7PM-7AM, please contact night-coverage [email or web address] Password [email or web address]

## 2019-07-28 NOTE — TOC Progression Note (Signed)
Transition of Care [facility]) - Progression Note    Patient Details  Name: [name] MRN: [id number] Date of Birth: 09/28/1930  Transition of [facility]) CM/SW Contact  Ross Ludwig, [place] Phone Number: 07/28/2019, 10:30 AM  Clinical Narrative:    CSW spoke with patient's granddaughter [name], and she would like patient to go to SNF now for short term rehab.  CSW informed her of the process and how insurance will pay for stay.  CSW informed her that there are a limited amount of SNFs that are able to accept patient since she tested positive for Covid.  CSW will have to see which SNFs have beds available.  CSW sent message to physician about patient needing to be sitter free for 24 hours, and also that family would like SNF now.  CSW updated bedside nurse in regards to patient's granddaughter request.  CSW was given permission to begin bed search for Covid + facilities.      Expected Discharge Plan and [place] facility.         Expected Discharge Date: 07/28/19                                     Social Determinants of Health (SDOH) Interventions    Readmission Risk Interventions No flowsheet data found.

## 2019-07-28 NOTE — Plan of Care (Signed)
  Problem: Safety: Goal: Ability to remain free from injury will improve Outcome: Progressing Note: [name] has remained free from falls and injuries this shift. Safety sitter at bedside.

## 2019-07-28 NOTE — Progress Notes (Signed)
PT Cancellation Note  Patient Details Name: [name] MRN: [id number] DOB: 26-Sep-1930   Cancelled Treatment:    Reason Eval/Treat Not Completed: Fatigue/lethargy limiting ability to participate(pt sleeping soundly, did not arouse to verbal/tactile stimulation. Per NT, pt was alert earlier this morning and fed herself.  Will follow.)   Philomena Doheny PT 07/28/2019  Acute Rehabilitation Services Pager [phone] Office [phone]

## 2019-07-29 DIAGNOSIS — R6889 Other general symptoms and signs: Secondary | ICD-10-CM | POA: Diagnosis not present

## 2019-07-29 DIAGNOSIS — U071 COVID-19: Secondary | ICD-10-CM | POA: Diagnosis not present

## 2019-07-29 DIAGNOSIS — I129 Hypertensive chronic kidney disease with stage 1 through stage 4 chronic kidney disease, or unspecified chronic kidney disease: Secondary | ICD-10-CM | POA: Diagnosis not present

## 2019-07-29 DIAGNOSIS — M6281 Muscle weakness (generalized): Secondary | ICD-10-CM | POA: Diagnosis not present

## 2019-07-29 DIAGNOSIS — E1122 Type 2 diabetes mellitus with diabetic chronic kidney disease: Secondary | ICD-10-CM | POA: Diagnosis not present

## 2019-07-29 DIAGNOSIS — E782 Mixed hyperlipidemia: Secondary | ICD-10-CM | POA: Diagnosis not present

## 2019-07-29 DIAGNOSIS — R400 Somnolence: Secondary | ICD-10-CM | POA: Diagnosis not present

## 2019-07-29 DIAGNOSIS — M2550 Pain in unspecified joint: Secondary | ICD-10-CM | POA: Diagnosis not present

## 2019-07-29 DIAGNOSIS — I10 Essential (primary) hypertension: Secondary | ICD-10-CM | POA: Diagnosis not present

## 2019-07-29 DIAGNOSIS — Z794 Long term (current) use of insulin: Secondary | ICD-10-CM | POA: Diagnosis not present

## 2019-07-29 DIAGNOSIS — E119 Type 2 diabetes mellitus without complications: Secondary | ICD-10-CM | POA: Diagnosis not present

## 2019-07-29 DIAGNOSIS — N183 Chronic kidney disease, stage 3 unspecified: Secondary | ICD-10-CM | POA: Diagnosis not present

## 2019-07-29 DIAGNOSIS — N390 Urinary tract infection, site not specified: Secondary | ICD-10-CM | POA: Diagnosis not present

## 2019-07-29 DIAGNOSIS — E1165 Type 2 diabetes mellitus with hyperglycemia: Secondary | ICD-10-CM | POA: Diagnosis not present

## 2019-07-29 DIAGNOSIS — Z7401 Bed confinement status: Secondary | ICD-10-CM | POA: Diagnosis not present

## 2019-07-29 DIAGNOSIS — R1312 Dysphagia, oropharyngeal phase: Secondary | ICD-10-CM | POA: Diagnosis not present

## 2019-07-29 DIAGNOSIS — J1282 Pneumonia due to coronavirus disease 2019: Secondary | ICD-10-CM | POA: Diagnosis not present

## 2019-07-29 DIAGNOSIS — I5032 Chronic diastolic (congestive) heart failure: Secondary | ICD-10-CM | POA: Diagnosis not present

## 2019-07-29 DIAGNOSIS — D649 Anemia, unspecified: Secondary | ICD-10-CM | POA: Diagnosis not present

## 2019-07-29 LAB — GLUCOSE, CAPILLARY
Glucose-Capillary: 103 mg/dL — ABNORMAL HIGH (ref 70–99)
Glucose-Capillary: 136 mg/dL — ABNORMAL HIGH (ref 70–99)
Glucose-Capillary: 52 mg/dL — ABNORMAL LOW (ref 70–99)
Glucose-Capillary: 93 mg/dL (ref 70–99)

## 2019-07-29 LAB — D-DIMER, QUANTITATIVE: D-Dimer, Quant: 0.77 ug/mL-FEU — ABNORMAL HIGH (ref 0.00–0.50)

## 2019-07-29 LAB — PROCALCITONIN: Procalcitonin: <0.1 ng/mL

## 2019-07-29 LAB — C-REACTIVE PROTEIN: CRP: 0.9 mg/dL (ref <1.0)

## 2019-07-29 NOTE — Progress Notes (Signed)
Report called to [facility] spoke with Charlynn Grimes RN [phone].

## 2019-07-29 NOTE — Progress Notes (Signed)
Pt granddaughter [name] was updated about when pt will be leaving.

## 2019-07-29 NOTE — Progress Notes (Signed)
[facility] PROGRESS NOTE    Progress Note  [name]  [id number] DOB: 22-Apr-1931 DOA: 07/21/2019 PCP: [name], MD     Brief Narrative:   [name] is an 86 y.o. female past medical history significant for dementia, congestive heart failure, diabetes mellitus, essential hypertension was admitted on 07/22/2019 with a temperature of 100.2 due to progressive generalized weakness during this time she was found to be COVID-19 positive, her chest x-ray during that time did not show any infiltrates, patient was unable to ambulate in the ED and she was referred for admission.  Assessment/Plan:   COVID-19 viral infection: She has been weaned to room air, she has completed a course of IV remdesivir and steroids, physical therapy evaluated the patient and recommended skilled nursing facility, the granddaughter at the beginning wanted to go home, but on 07/27/2018 when she decided she wanted the patient placed a skilled nursing facility. Nothing is change overnight awaiting placement to skilled nursing facility. There were no changes overnight she had a calm and pleasant night and this morning she is pleasant.  Chronic diastolic heart failure: Generalized weakness likely due to COVID-19: Uncontrolled diabetes mellitus type 2 with hyperglycemia: Essential hypertension: UTI: Acute confusional state/Advanced dementia:   DVT prophylaxis: lovenox Family Communication:Granddaughter Disposition Plan/Barrier to D/C: Hopefully tomorrow morning home with physical therapy. Code Status:     Code Status Orders  (From admission, onward)         Start     Ordered   07/23/19 1708  Full code  Continuous     07/23/19 1721        Code Status History    Date Active Date Inactive Code Status Order ID Comments User Context   07/22/2019 0118 07/23/2019 1721 Full Code [id number]  Phillips Grout, MD ED   04/30/2017 1026 05/08/2017 1841 DNR [id number]  Janece Canterbury, MD ED   02/22/2017  1852 02/28/2017 1644 DNR [id number]  Eugenie Filler, MD Inpatient   09/01/2016 0322 09/02/2016 1605 Full Code [id number]  Karmen Bongo, MD Inpatient   05/15/2016 1724 05/18/2016 1650 Full Code [id number]  Norval Morton, MD Inpatient   Advance Care Planning Activity        IV Access:    Peripheral IV   Procedures and diagnostic studies:   No results found.   Medical Consultants:    None.  Anti-Infectives:   None  Subjective:    [name] no complaints this morning pleasant.  Objective:    Vitals:   07/28/19 1605 07/28/19 1948 07/28/19 2156 07/29/19 0645  BP: 135/80 (!) 185/82 (!) 167/83 (!) 155/87  Pulse: 71 71 68 70  Resp: 19 (!) 24  16  Temp: 97.8 F (36.6 C) 98.1 F (36.7 C)  (!) 97.5 F (36.4 C)  TempSrc: Oral Oral  Oral  SpO2: 96% 94%  96%  Weight:      Height:       SpO2: 96 % O2 Flow Rate (L/min): 2 L/min   Intake/Output Summary (Last 24 hours) at 07/29/2019 0829 Last data filed at 07/28/2019 2300 Gross per 24 hour  Intake 560 ml  Output 850 ml  Net -290 ml   Filed Weights   07/23/19 0900  Weight: 84.4 kg    Exam: General exam: In no acute distress. Respiratory system: Good air movement and clear to auscultation. Cardiovascular system: S1 & S2 heard, RRR. No JVD. Gastrointestinal system: Abdomen is nondistended, soft and nontender.  Central nervous system: Alert  and oriented. No focal neurological deficits. Extremities: No pedal edema. Skin: No rashes, lesions or ulcers  Data Reviewed:    Labs: Basic Metabolic Panel: Recent Labs  Lab 07/23/19 2032 07/24/19 0038 07/25/19 0201 07/25/19 0201 07/25/19 2005 07/25/19 2005 07/26/19 0517 07/26/19 0517 07/27/19 0414 07/28/19 0850  NA  --    < > 136  --  135  --  138  --  139 140  K  --    < > 3.8   < > 4.1   < > 4.0   < > 4.4 4.1  CL  --    < > 104  --  107  --  110  --  106 109  CO2  --    < > 20*  --  18*  --  20*  --  21* 20*  GLUCOSE  --    < > 298*  --  336*  --   159*  --  263* 221*  BUN  --    < > 48*  --  45*  --  37*  --  37* 33*  CREATININE 1.15*   < > 1.04*  --  0.93  --  0.87  --  0.88 0.82  CALCIUM  --    < > 7.7*  --  7.7*  --  7.7*  --  8.6* 8.3*  PHOS 2.2*  --   --   --   --   --   --   --   --   --    < > = values in this interval not displayed.   GFR Estimated Creatinine Clearance: 52.9 mL/min (by C-G formula based on SCr of 0.82 mg/dL). Liver Function Tests: Recent Labs  Lab 07/23/19 1247 07/24/19 0038  AST 39 39  ALT 28 29  ALKPHOS 65 58  BILITOT 0.6 0.4  PROT 7.5 6.9  ALBUMIN 3.0* 2.8*   No results for input(s): LIPASE, AMYLASE in the last 168 hours. No results for input(s): AMMONIA in the last 168 hours. Coagulation profile No results for input(s): INR, PROTIME in the last 168 hours. COVID-19 Labs  Recent Labs    07/27/19 0845 07/28/19 0850 07/28/19 0851 07/29/19 0125  DDIMER 0.88*  --  0.85* 0.77*  CRP  --  1.2*  --  0.9    No results found for: SARSCOV2NAA  CBC: Recent Labs  Lab 07/23/19 1247 07/23/19 2032 07/24/19 0038  WBC 8.1 7.0 7.4  NEUTROABS 6.5 5.3 5.5  HGB 14.5 14.1 13.5  HCT 44.8 42.9 41.0  MCV 90.1 87.7 87.0  PLT 214 211 205   Cardiac Enzymes: No results for input(s): CKTOTAL, CKMB, CKMBINDEX, TROPONINI in the last 168 hours. BNP (last 3 results) No results for input(s): PROBNP in the last 8760 hours. CBG: Recent Labs  Lab 07/28/19 0819 07/28/19 1148 07/28/19 1739 07/28/19 2044 07/29/19 0735  GLUCAP 138* 224* 80 135* 103*   D-Dimer: Recent Labs    07/28/19 0851 07/29/19 0125  DDIMER 0.85* 0.77*   Hgb A1c: No results for input(s): HGBA1C in the last 72 hours. Lipid Profile: No results for input(s): CHOL, HDL, LDLCALC, TRIG, CHOLHDL, LDLDIRECT in the last 72 hours. Thyroid function studies: No results for input(s): TSH, T4TOTAL, T3FREE, THYROIDAB in the last 72 hours.  Invalid input(s): FREET3 Anemia work up: No results for input(s): VITAMINB12, FOLATE, FERRITIN, TIBC,  IRON, RETICCTPCT in the last 72 hours. Sepsis Labs: Recent Labs  Lab 07/23/19 1247 07/23/19 2032 07/24/19 0038  07/27/19 0825 07/28/19 0850 07/29/19 0125  PROCALCITON  --   --   --  0.10 <0.10 <0.10  WBC 8.1 7.0 7.4  --   --   --    Microbiology Recent Results (from the past 240 hour(s))  Blood Culture (routine x 2)     Status: None   Collection Time: 07/21/19 10:21 PM   Specimen: BLOOD  Result Value Ref Range Status   Specimen Description   Final    BLOOD SITE NOT SPECIFIED Performed at [facility], [address] [place], [place] [address]    Special Requests   Final    BOTTLES DRAWN AEROBIC AND ANAEROBIC Blood Culture adequate volume Performed at [facility] [address] [place], [place] [address]    Culture   Final    NO GROWTH 5 DAYS Performed at [facility], [place] [address], [place], [place] [address]    Report Status 07/27/2019 FINAL  Final  Urine culture     Status: Abnormal   Collection Time: 07/21/19 10:21 PM   Specimen: In/Out Cath Urine  Result Value Ref Range Status   Specimen Description   Final    IN/OUT CATH URINE Performed at [facility] [address] [place], [place] [address]    Special Requests   Final    NONE Performed at [facility], [place] [address] [place], [place] [address]    Culture >=100,000 COLONIES/mL ESCHERICHIA COLI (A)  Final   Report Status 07/24/2019 FINAL  Final   Organism ID, Bacteria ESCHERICHIA COLI (A)  Final      Susceptibility   Escherichia coli - MIC*    AMPICILLIN 8 SENSITIVE Sensitive     CEFAZOLIN <=4 SENSITIVE Sensitive     CEFTRIAXONE <=0.25 SENSITIVE Sensitive     CIPROFLOXACIN <=0.25 SENSITIVE Sensitive     GENTAMICIN <=1 SENSITIVE Sensitive     IMIPENEM <=0.25 SENSITIVE Sensitive     NITROFURANTOIN <=16 SENSITIVE Sensitive     TRIMETH/SULFA <=20 SENSITIVE Sensitive     AMPICILLIN/SULBACTAM 4 SENSITIVE Sensitive     PIP/TAZO <=4  SENSITIVE Sensitive     * >=100,000 COLONIES/mL ESCHERICHIA COLI  MRSA PCR Screening     Status: None   Collection Time: 07/23/19 10:08 PM   Specimen: Nasal Mucosa; Nasopharyngeal  Result Value Ref Range Status   MRSA by PCR NEGATIVE NEGATIVE Final    Comment:        The GeneXpert MRSA Assay (FDA approved for NASAL specimens only), is one component of a comprehensive MRSA colonization surveillance program. It is not intended to diagnose MRSA infection nor to guide or monitor treatment for MRSA infections. Performed at [facility], [place] [address], [place],  [address]      Medications:   . albuterol  2 puff Inhalation Q6H  . ALPRAZolam  0.5 mg Oral BID  . amLODipine  10 mg Oral q morning - 10a  . vitamin C  500 mg Oral Daily  . benzonatate  200 mg Oral TID  . budesonide  1 puff Inhalation BID  . carvedilol  6.25 mg Oral BID WC  . cephALEXin  500 mg Oral Q8H  . Chlorhexidine Gluconate Cloth  6 each Topical Daily  . docusate sodium  100 mg Oral BID  . enoxaparin (LOVENOX) injection  40 mg Subcutaneous Q24H  . furosemide  20 mg Oral Daily  . glipiZIDE  10 mg Oral QAC breakfast  .  insulin aspart  0-15 Units Subcutaneous TID WC  . insulin aspart  5 Units Subcutaneous TID WC  . ipratropium  2 puff Inhalation Q6H  . mouth rinse  15 mL Mouth Rinse BID  . metFORMIN  500 mg Oral BID WC  . potassium chloride  10 mEq Oral Daily  . zinc sulfate  220 mg Oral Daily   Continuous Infusions:      LOS: 6 days   Charlynne Cousins  [facility]  07/29/2019, 8:29 AM

## 2019-07-29 NOTE — Plan of Care (Signed)
  Problem: Clinical Measurements: °Goal: Diagnostic test results will improve °Outcome: Progressing °Goal: Respiratory complications will improve °Outcome: Progressing °Goal: Cardiovascular complication will be avoided °Outcome: Progressing °  °Problem: Activity: °Goal: Risk for activity intolerance will decrease °Outcome: Progressing °  °Problem: Nutrition: °Goal: Adequate nutrition will be maintained °Outcome: Progressing °  °Problem: Coping: °Goal: Level of anxiety will decrease °Outcome: Progressing °  °Problem: Elimination: °Goal: Will not experience complications related to bowel motility °Outcome: Progressing °Goal: Will not experience complications related to urinary retention °Outcome: Progressing °  °Problem: Pain Managment: °Goal: General experience of comfort will improve °Outcome: Progressing °  °Problem: Safety: °Goal: Ability to remain free from injury will improve °Outcome: Progressing °  °Problem: Skin Integrity: °Goal: Risk for impaired skin integrity will decrease °Outcome: Progressing °  °

## 2019-07-29 NOTE — TOC Transition Note (Addendum)
Transition of Care [facility]) - CM/SW Discharge Note   Patient Details  Name: [name] MRN: [id number] Date of Birth: 1931/06/16  Transition of Care [facility]) CM/SW Contact:  Ross Ludwig, LCSW Phone Number: 07/29/2019, 10:53 AM   Clinical Narrative:    CSW spoke to [facility] in [place] and they are able to accept patient today.  CSW updated physician, and patient's grandson [name], patient has [employer], insurance authorization has been started reference number [id number], CSW spoke to [place] at [facility] she will begin reviewing patient's clinical information for approval.  CSW received insurance approval for patient to go to SNF for short term rehab.  [name] Medicare reference number is [id number], [name] is the case worker, Bent updated [facility] in [place].  Patient to be d/c'ed today to [facility].  Patient and family agreeable to plans will transport via ems RN to call report to [address] hall, [phone].  CSW spoke to patient's grandson in law [name] and updated him that patient will be ready for discharge once physician completes discharge paperwork.   Final next level of care: Skilled Nursing Facility Barriers to Discharge: Barriers Resolved   Patient Goals and CMS Choice Patient states their goals for this hospitalization and ongoing recovery are:: To go to SNF then return back home with home health. CMS Medicare.gov Compare Post Acute Care list provided to:: Patient Represenative (must comment) Choice offered to / list presented to : [place] / Guardian  Discharge Placement PASRR number recieved: 07/28/19            Patient chooses bed at: [facility] Patient to be transferred to facility by: [facility] Name of family member notified: [name] [phone] Patient and family notified of of transfer: 07/29/19  Discharge Plan and Services  Patient will be going to SNF for short term rehab.                                     Social  Determinants of Health (SDOH) Interventions     Readmission Risk Interventions No flowsheet data found.

## 2019-07-29 NOTE — Progress Notes (Signed)
Pt blood glucose taken at 1648 was 52. Pt given 2 cups of apple juice and blood glucose rechecked at 1726 and was 93.

## 2019-08-01 DIAGNOSIS — E119 Type 2 diabetes mellitus without complications: Secondary | ICD-10-CM | POA: Diagnosis not present

## 2019-08-01 DIAGNOSIS — U071 COVID-19: Secondary | ICD-10-CM | POA: Diagnosis not present

## 2019-08-01 DIAGNOSIS — R400 Somnolence: Secondary | ICD-10-CM | POA: Diagnosis not present

## 2019-08-01 DIAGNOSIS — I10 Essential (primary) hypertension: Secondary | ICD-10-CM | POA: Diagnosis not present

## 2019-08-03 DIAGNOSIS — E119 Type 2 diabetes mellitus without complications: Secondary | ICD-10-CM | POA: Diagnosis not present

## 2019-08-03 DIAGNOSIS — D649 Anemia, unspecified: Secondary | ICD-10-CM | POA: Diagnosis not present

## 2019-08-03 DIAGNOSIS — R6889 Other general symptoms and signs: Secondary | ICD-10-CM | POA: Diagnosis not present

## 2019-08-06 DIAGNOSIS — R6889 Other general symptoms and signs: Secondary | ICD-10-CM | POA: Diagnosis not present

## 2019-08-06 DIAGNOSIS — A0472 Enterocolitis due to Clostridium difficile, not specified as recurrent: Secondary | ICD-10-CM | POA: Diagnosis not present

## 2019-08-10 DIAGNOSIS — N183 Chronic kidney disease, stage 3 unspecified: Secondary | ICD-10-CM | POA: Diagnosis not present

## 2019-08-10 DIAGNOSIS — D649 Anemia, unspecified: Secondary | ICD-10-CM | POA: Diagnosis not present

## 2019-08-10 DIAGNOSIS — E782 Mixed hyperlipidemia: Secondary | ICD-10-CM | POA: Diagnosis not present

## 2019-08-10 DIAGNOSIS — I129 Hypertensive chronic kidney disease with stage 1 through stage 4 chronic kidney disease, or unspecified chronic kidney disease: Secondary | ICD-10-CM | POA: Diagnosis not present

## 2019-08-10 DIAGNOSIS — E1122 Type 2 diabetes mellitus with diabetic chronic kidney disease: Secondary | ICD-10-CM | POA: Diagnosis not present

## 2019-08-19 DIAGNOSIS — I5032 Chronic diastolic (congestive) heart failure: Secondary | ICD-10-CM | POA: Diagnosis not present

## 2019-08-19 DIAGNOSIS — E119 Type 2 diabetes mellitus without complications: Secondary | ICD-10-CM | POA: Diagnosis not present

## 2019-08-19 DIAGNOSIS — I10 Essential (primary) hypertension: Secondary | ICD-10-CM | POA: Diagnosis not present

## 2019-08-23 DIAGNOSIS — E1136 Type 2 diabetes mellitus with diabetic cataract: Secondary | ICD-10-CM | POA: Diagnosis not present

## 2019-08-23 DIAGNOSIS — I5032 Chronic diastolic (congestive) heart failure: Secondary | ICD-10-CM | POA: Diagnosis not present

## 2019-08-23 DIAGNOSIS — Z9181 History of falling: Secondary | ICD-10-CM | POA: Diagnosis not present

## 2019-08-23 DIAGNOSIS — I110 Hypertensive heart disease with heart failure: Secondary | ICD-10-CM | POA: Diagnosis not present

## 2019-08-23 DIAGNOSIS — Z85038 Personal history of other malignant neoplasm of large intestine: Secondary | ICD-10-CM | POA: Diagnosis not present

## 2019-08-23 DIAGNOSIS — Z87440 Personal history of urinary (tract) infections: Secondary | ICD-10-CM | POA: Diagnosis not present

## 2019-08-23 DIAGNOSIS — N390 Urinary tract infection, site not specified: Secondary | ICD-10-CM | POA: Diagnosis not present

## 2019-08-23 DIAGNOSIS — Z7952 Long term (current) use of systemic steroids: Secondary | ICD-10-CM | POA: Diagnosis not present

## 2019-08-23 DIAGNOSIS — M1990 Unspecified osteoarthritis, unspecified site: Secondary | ICD-10-CM | POA: Diagnosis not present

## 2019-08-23 DIAGNOSIS — Z794 Long term (current) use of insulin: Secondary | ICD-10-CM | POA: Diagnosis not present

## 2019-08-24 DIAGNOSIS — Z87440 Personal history of urinary (tract) infections: Secondary | ICD-10-CM | POA: Diagnosis not present

## 2019-08-24 DIAGNOSIS — I110 Hypertensive heart disease with heart failure: Secondary | ICD-10-CM | POA: Diagnosis not present

## 2019-08-24 DIAGNOSIS — E1136 Type 2 diabetes mellitus with diabetic cataract: Secondary | ICD-10-CM | POA: Diagnosis not present

## 2019-08-24 DIAGNOSIS — Z85038 Personal history of other malignant neoplasm of large intestine: Secondary | ICD-10-CM | POA: Diagnosis not present

## 2019-08-24 DIAGNOSIS — Z794 Long term (current) use of insulin: Secondary | ICD-10-CM | POA: Diagnosis not present

## 2019-08-24 DIAGNOSIS — M1990 Unspecified osteoarthritis, unspecified site: Secondary | ICD-10-CM | POA: Diagnosis not present

## 2019-08-24 DIAGNOSIS — Z7952 Long term (current) use of systemic steroids: Secondary | ICD-10-CM | POA: Diagnosis not present

## 2019-08-24 DIAGNOSIS — Z9181 History of falling: Secondary | ICD-10-CM | POA: Diagnosis not present

## 2019-08-24 DIAGNOSIS — N390 Urinary tract infection, site not specified: Secondary | ICD-10-CM | POA: Diagnosis not present

## 2019-08-24 DIAGNOSIS — I5032 Chronic diastolic (congestive) heart failure: Secondary | ICD-10-CM | POA: Diagnosis not present

## 2019-09-02 DIAGNOSIS — Z8616 Personal history of COVID-19: Secondary | ICD-10-CM | POA: Diagnosis not present

## 2019-09-06 DIAGNOSIS — M1990 Unspecified osteoarthritis, unspecified site: Secondary | ICD-10-CM | POA: Diagnosis not present

## 2019-09-06 DIAGNOSIS — E1136 Type 2 diabetes mellitus with diabetic cataract: Secondary | ICD-10-CM | POA: Diagnosis not present

## 2019-09-06 DIAGNOSIS — Z87440 Personal history of urinary (tract) infections: Secondary | ICD-10-CM | POA: Diagnosis not present

## 2019-09-06 DIAGNOSIS — I110 Hypertensive heart disease with heart failure: Secondary | ICD-10-CM | POA: Diagnosis not present

## 2019-09-06 DIAGNOSIS — Z794 Long term (current) use of insulin: Secondary | ICD-10-CM | POA: Diagnosis not present

## 2019-09-06 DIAGNOSIS — N390 Urinary tract infection, site not specified: Secondary | ICD-10-CM | POA: Diagnosis not present

## 2019-09-06 DIAGNOSIS — Z85038 Personal history of other malignant neoplasm of large intestine: Secondary | ICD-10-CM | POA: Diagnosis not present

## 2019-09-06 DIAGNOSIS — I5032 Chronic diastolic (congestive) heart failure: Secondary | ICD-10-CM | POA: Diagnosis not present

## 2019-09-06 DIAGNOSIS — Z7952 Long term (current) use of systemic steroids: Secondary | ICD-10-CM | POA: Diagnosis not present

## 2019-09-06 DIAGNOSIS — Z9181 History of falling: Secondary | ICD-10-CM | POA: Diagnosis not present

## 2019-09-07 DIAGNOSIS — Z85038 Personal history of other malignant neoplasm of large intestine: Secondary | ICD-10-CM | POA: Diagnosis not present

## 2019-09-07 DIAGNOSIS — E1136 Type 2 diabetes mellitus with diabetic cataract: Secondary | ICD-10-CM | POA: Diagnosis not present

## 2019-09-07 DIAGNOSIS — I5032 Chronic diastolic (congestive) heart failure: Secondary | ICD-10-CM | POA: Diagnosis not present

## 2019-09-07 DIAGNOSIS — I110 Hypertensive heart disease with heart failure: Secondary | ICD-10-CM | POA: Diagnosis not present

## 2019-09-07 DIAGNOSIS — Z7952 Long term (current) use of systemic steroids: Secondary | ICD-10-CM | POA: Diagnosis not present

## 2019-09-07 DIAGNOSIS — Z794 Long term (current) use of insulin: Secondary | ICD-10-CM | POA: Diagnosis not present

## 2019-09-07 DIAGNOSIS — N390 Urinary tract infection, site not specified: Secondary | ICD-10-CM | POA: Diagnosis not present

## 2019-09-07 DIAGNOSIS — Z87440 Personal history of urinary (tract) infections: Secondary | ICD-10-CM | POA: Diagnosis not present

## 2019-09-07 DIAGNOSIS — M1990 Unspecified osteoarthritis, unspecified site: Secondary | ICD-10-CM | POA: Diagnosis not present

## 2019-09-07 DIAGNOSIS — Z9181 History of falling: Secondary | ICD-10-CM | POA: Diagnosis not present

## 2019-09-09 DIAGNOSIS — Z7952 Long term (current) use of systemic steroids: Secondary | ICD-10-CM | POA: Diagnosis not present

## 2019-09-09 DIAGNOSIS — Z794 Long term (current) use of insulin: Secondary | ICD-10-CM | POA: Diagnosis not present

## 2019-09-09 DIAGNOSIS — I110 Hypertensive heart disease with heart failure: Secondary | ICD-10-CM | POA: Diagnosis not present

## 2019-09-09 DIAGNOSIS — E1136 Type 2 diabetes mellitus with diabetic cataract: Secondary | ICD-10-CM | POA: Diagnosis not present

## 2019-09-09 DIAGNOSIS — Z9181 History of falling: Secondary | ICD-10-CM | POA: Diagnosis not present

## 2019-09-09 DIAGNOSIS — Z85038 Personal history of other malignant neoplasm of large intestine: Secondary | ICD-10-CM | POA: Diagnosis not present

## 2019-09-09 DIAGNOSIS — N390 Urinary tract infection, site not specified: Secondary | ICD-10-CM | POA: Diagnosis not present

## 2019-09-09 DIAGNOSIS — M1990 Unspecified osteoarthritis, unspecified site: Secondary | ICD-10-CM | POA: Diagnosis not present

## 2019-09-09 DIAGNOSIS — I5032 Chronic diastolic (congestive) heart failure: Secondary | ICD-10-CM | POA: Diagnosis not present

## 2019-09-09 DIAGNOSIS — Z87440 Personal history of urinary (tract) infections: Secondary | ICD-10-CM | POA: Diagnosis not present

## 2019-09-11 ENCOUNTER — Emergency Department (HOSPITAL_COMMUNITY): Payer: Medicare Other

## 2019-09-11 ENCOUNTER — Other Ambulatory Visit: Payer: Self-pay

## 2019-09-11 ENCOUNTER — Encounter (HOSPITAL_COMMUNITY): Payer: Self-pay

## 2019-09-11 ENCOUNTER — Emergency Department (HOSPITAL_COMMUNITY)
Admission: EM | Admit: 2019-09-11 | Discharge: 2019-09-11 | Disposition: A | Discharge status: Home / Self Care | Payer: Medicare Other | Source: Home / Self Care | Attending: Emergency Medicine | Admitting: Emergency Medicine

## 2019-09-11 DIAGNOSIS — R531 Weakness: Secondary | ICD-10-CM | POA: Insufficient documentation

## 2019-09-11 DIAGNOSIS — S3993XA Unspecified injury of pelvis, initial encounter: Secondary | ICD-10-CM | POA: Diagnosis not present

## 2019-09-11 DIAGNOSIS — Z9049 Acquired absence of other specified parts of digestive tract: Secondary | ICD-10-CM | POA: Diagnosis not present

## 2019-09-11 DIAGNOSIS — R21 Rash and other nonspecific skin eruption: Secondary | ICD-10-CM | POA: Diagnosis not present

## 2019-09-11 DIAGNOSIS — M25551 Pain in right hip: Secondary | ICD-10-CM | POA: Diagnosis not present

## 2019-09-11 DIAGNOSIS — J189 Pneumonia, unspecified organism: Secondary | ICD-10-CM | POA: Diagnosis not present

## 2019-09-11 DIAGNOSIS — Z8719 Personal history of other diseases of the digestive system: Secondary | ICD-10-CM | POA: Diagnosis not present

## 2019-09-11 DIAGNOSIS — S299XXA Unspecified injury of thorax, initial encounter: Secondary | ICD-10-CM | POA: Diagnosis not present

## 2019-09-11 DIAGNOSIS — N3000 Acute cystitis without hematuria: Secondary | ICD-10-CM | POA: Insufficient documentation

## 2019-09-11 DIAGNOSIS — E119 Type 2 diabetes mellitus without complications: Secondary | ICD-10-CM | POA: Insufficient documentation

## 2019-09-11 DIAGNOSIS — Z79899 Other long term (current) drug therapy: Secondary | ICD-10-CM | POA: Insufficient documentation

## 2019-09-11 DIAGNOSIS — M1990 Unspecified osteoarthritis, unspecified site: Secondary | ICD-10-CM | POA: Diagnosis not present

## 2019-09-11 DIAGNOSIS — I110 Hypertensive heart disease with heart failure: Secondary | ICD-10-CM | POA: Insufficient documentation

## 2019-09-11 DIAGNOSIS — W19XXXA Unspecified fall, initial encounter: Secondary | ICD-10-CM | POA: Diagnosis not present

## 2019-09-11 DIAGNOSIS — H9190 Unspecified hearing loss, unspecified ear: Secondary | ICD-10-CM | POA: Diagnosis not present

## 2019-09-11 DIAGNOSIS — L400 Psoriasis vulgaris: Secondary | ICD-10-CM | POA: Diagnosis not present

## 2019-09-11 DIAGNOSIS — Z9181 History of falling: Secondary | ICD-10-CM | POA: Diagnosis not present

## 2019-09-11 DIAGNOSIS — Z823 Family history of stroke: Secondary | ICD-10-CM | POA: Diagnosis not present

## 2019-09-11 DIAGNOSIS — J811 Chronic pulmonary edema: Secondary | ICD-10-CM | POA: Diagnosis not present

## 2019-09-11 DIAGNOSIS — Z794 Long term (current) use of insulin: Secondary | ICD-10-CM | POA: Insufficient documentation

## 2019-09-11 DIAGNOSIS — T50995A Adverse effect of other drugs, medicaments and biological substances, initial encounter: Secondary | ICD-10-CM | POA: Diagnosis not present

## 2019-09-11 DIAGNOSIS — N390 Urinary tract infection, site not specified: Secondary | ICD-10-CM | POA: Diagnosis not present

## 2019-09-11 DIAGNOSIS — R627 Adult failure to thrive: Secondary | ICD-10-CM | POA: Diagnosis not present

## 2019-09-11 DIAGNOSIS — Z85038 Personal history of other malignant neoplasm of large intestine: Secondary | ICD-10-CM | POA: Insufficient documentation

## 2019-09-11 DIAGNOSIS — Z993 Dependence on wheelchair: Secondary | ICD-10-CM | POA: Diagnosis not present

## 2019-09-11 DIAGNOSIS — Z7952 Long term (current) use of systemic steroids: Secondary | ICD-10-CM | POA: Diagnosis not present

## 2019-09-11 DIAGNOSIS — B9620 Unspecified Escherichia coli [E. coli] as the cause of diseases classified elsewhere: Secondary | ICD-10-CM | POA: Diagnosis not present

## 2019-09-11 DIAGNOSIS — I5033 Acute on chronic diastolic (congestive) heart failure: Secondary | ICD-10-CM | POA: Insufficient documentation

## 2019-09-11 DIAGNOSIS — R296 Repeated falls: Secondary | ICD-10-CM | POA: Diagnosis not present

## 2019-09-11 DIAGNOSIS — R410 Disorientation, unspecified: Secondary | ICD-10-CM | POA: Diagnosis not present

## 2019-09-11 DIAGNOSIS — Z66 Do not resuscitate: Secondary | ICD-10-CM | POA: Diagnosis not present

## 2019-09-11 DIAGNOSIS — I10 Essential (primary) hypertension: Secondary | ICD-10-CM | POA: Diagnosis not present

## 2019-09-11 DIAGNOSIS — Z8616 Personal history of COVID-19: Secondary | ICD-10-CM | POA: Diagnosis not present

## 2019-09-11 DIAGNOSIS — E1136 Type 2 diabetes mellitus with diabetic cataract: Secondary | ICD-10-CM | POA: Diagnosis not present

## 2019-09-11 DIAGNOSIS — I5032 Chronic diastolic (congestive) heart failure: Secondary | ICD-10-CM | POA: Diagnosis not present

## 2019-09-11 DIAGNOSIS — Z87440 Personal history of urinary (tract) infections: Secondary | ICD-10-CM | POA: Diagnosis not present

## 2019-09-11 DIAGNOSIS — Z743 Need for continuous supervision: Secondary | ICD-10-CM | POA: Diagnosis not present

## 2019-09-11 LAB — COMPREHENSIVE METABOLIC PANEL
ALT: 13 U/L (ref 0–44)
AST: 18 U/L (ref 15–41)
Albumin: 2.9 g/dL — ABNORMAL LOW (ref 3.5–5.0)
Alkaline Phosphatase: 66 U/L (ref 38–126)
Anion gap: 8 (ref 5–15)
BUN: 14 mg/dL (ref 8–23)
CO2: 28 mmol/L (ref 22–32)
Calcium: 8.5 mg/dL — ABNORMAL LOW (ref 8.9–10.3)
Chloride: 105 mmol/L (ref 98–111)
Creatinine, Ser: 0.92 mg/dL (ref 0.44–1.00)
GFR calc Af Amer: >60 mL/min (ref >60)
GFR calc non Af Amer: 56 mL/min — ABNORMAL LOW (ref >60)
Glucose, Bld: 170 mg/dL — ABNORMAL HIGH (ref 70–99)
Potassium: 3.4 mmol/L — ABNORMAL LOW (ref 3.5–5.1)
Sodium: 141 mmol/L (ref 135–145)
Total Bilirubin: 0.7 mg/dL (ref 0.3–1.2)
Total Protein: 6.8 g/dL (ref 6.5–8.1)

## 2019-09-11 LAB — CBC WITH DIFFERENTIAL/PLATELET
Abs Immature Granulocytes: 0.02 10*3/uL (ref 0.00–0.07)
Basophils Absolute: 0.1 10*3/uL (ref 0.0–0.1)
Basophils Relative: 1 %
Eosinophils Absolute: 0.4 10*3/uL (ref 0.0–0.5)
Eosinophils Relative: 5 %
HCT: 38.3 % (ref 36.0–46.0)
Hemoglobin: 12.4 g/dL (ref 12.0–15.0)
Immature Granulocytes: 0 %
Lymphocytes Relative: 23 %
Lymphs Abs: 1.9 10*3/uL (ref 0.7–4.0)
MCH: 29.5 pg (ref 26.0–34.0)
MCHC: 32.4 g/dL (ref 30.0–36.0)
MCV: 91 fL (ref 80.0–100.0)
Monocytes Absolute: 0.8 10*3/uL (ref 0.1–1.0)
Monocytes Relative: 9 %
Neutro Abs: 5.1 10*3/uL (ref 1.7–7.7)
Neutrophils Relative %: 62 %
Platelets: 305 10*3/uL (ref 150–400)
RBC: 4.21 MIL/uL (ref 3.87–5.11)
RDW: 15.1 % (ref 11.5–15.5)
WBC: 8.3 10*3/uL (ref 4.0–10.5)
nRBC: 0 % (ref 0.0–0.2)

## 2019-09-11 LAB — URINALYSIS, ROUTINE W REFLEX MICROSCOPIC
Bilirubin Urine: NEGATIVE
Glucose, UA: NEGATIVE mg/dL
Hgb urine dipstick: NEGATIVE
Ketones, ur: NEGATIVE mg/dL
Nitrite: POSITIVE — AB
Protein, ur: NEGATIVE mg/dL
Specific Gravity, Urine: 1.009 (ref 1.005–1.030)
pH: 6 (ref 5.0–8.0)

## 2019-09-11 MED ORDER — CEPHALEXIN 500 MG PO CAPS
500.0000 mg | ORAL_CAPSULE | Freq: Once | ORAL | Status: AC
  Administered 2019-09-11: 15:00:00 500 mg via ORAL
  Filled 2019-09-11: qty 1

## 2019-09-11 MED ORDER — CEPHALEXIN 500 MG PO CAPS: 500.0000 mg | ORAL_CAPSULE | Freq: Four times a day (QID) | ORAL | 0 refills | Status: DC

## 2019-09-11 NOTE — ED Notes (Addendum)
EMS reports that pt lives with son and has fallen several times in last week.  House is in poor living conditions.  Pt able to walk from bedroom to front of house with walker turned side ways and pushing it through tons of stuff.  Walkway nearly not passable per EMS.  Pt denies an pain or head injury.  Pt says, "I do not hit my head."

## 2019-09-11 NOTE — Discharge Instructions (Addendum)
Follow-up with your doctor later this week.  We have contacted the social workers to see if there is something they can help at home.  He can discuss possible nursing home placement with your family doctor if you want to

## 2019-09-11 NOTE — ED Provider Notes (Signed)
[facility] EMERGENCY DEPARTMENT Provider Note   CSN: [id number] Arrival date & time: 09/11/19  1017     History Chief Complaint  Patient presents with  . Fall    [name] is a 86 y.o. female.  Patient fell at home.  According to her grandson she has been a little bit more confused than normal.  Patient does not complain of any pain  The history is provided by the patient. No language interpreter was used.  Fall This is a new problem. The current episode started 3 to 5 hours ago. The problem occurs daily. The problem has not changed since onset.Pertinent negatives include no chest pain, no abdominal pain and no headaches. Nothing aggravates the symptoms. Nothing relieves the symptoms. She has tried nothing for the symptoms. The treatment provided no relief.       Past Medical History:  Diagnosis Date  . Anxiety   . Arthritis   . Cancer [facility]) 2006   colon  . Cataract   . CHF (congestive heart failure) ([place]) 05/2016   preserved EF, grade 2 diastolic dysfunction  . Diabetes mellitus without complication ([place])   . Diverticulitis   . Erythroderma desquamativum   . Hypertension     Patient Active Problem List   Diagnosis Date Noted  . Pneumonia due to COVID-19 virus 07/22/2019  . Acute on chronic diastolic CHF (congestive heart failure) ([place]) 05/06/2017  . Altered mental status   . Acute respiratory failure ([place]) 05/04/2017  . Hypoglycemia 04/30/2017  . Acute urinary retention 02/23/2017  . Chronic diastolic CHF (congestive heart failure) ([place])   . SBO (small bowel obstruction) ([place]) 02/22/2017  . Hypertensive urgency 02/22/2017  . Leukocytosis 02/22/2017  . Hyponatremia 02/22/2017  . Dehydration 02/22/2017  . Dizziness 09/01/2016  . Weakness 09/01/2016  . Psoriasis vulgaris 09/01/2016  . Right hip pain 09/01/2016  . Diabetic foot ulcer ([place]) 09/01/2016  . Dyspnea 05/15/2016  . Congestive heart failure (CHF) ([place]) 05/15/2016  . Rash and nonspecific skin  eruption 05/15/2016  . Essential hypertension 05/15/2016  . Uncontrolled type 2 diabetes mellitus with hyperglycemia ([place]) 05/15/2016  . Anxiety 05/15/2016  . Acute respiratory failure with hypoxia ([place]) 05/15/2016    Past Surgical History:  Procedure Laterality Date  . ABDOMINAL HYSTERECTOMY    . APPENDECTOMY    . COLON SURGERY       OB History   No obstetric history on file.     Family History  Problem Relation Age of Onset  . Stroke Mother   . Diabetes Father   . Cancer Sister   . Diabetes Sister   . Cancer Brother   . Cancer Brother   . Diabetes Sister   . Stroke Sister     Social History   Tobacco Use  . Smoking status: Never Smoker  . Smokeless tobacco: Never Used  Substance Use Topics  . Alcohol use: No  . Drug use: No    Home Medications Prior to Admission medications   Medication Sig Start Date End Date Taking? Authorizing Provider  ALPRAZolam Duanne Moron) 0.5 MG tablet Take 1 tablet (0.5 mg total) by mouth 3 (three) times daily. Patient taking differently: Take 0.5 mg by mouth 2 (two) times daily as needed for anxiety or sleep.  05/08/17  Yes Tat, Shanon Brow, MD  amLODipine (NORVASC) 10 MG tablet Take 10 mg by mouth every morning. 07/18/19  Yes [provider]  carvedilol (COREG) 6.25 MG tablet Take 1 tablet (6.25 mg total) by mouth 2 (  two) times daily with a meal. 05/08/17  Yes Tat, Shanon Brow, MD  furosemide (LASIX) 40 MG tablet Take 1 tablet (40 mg total) by mouth every morning. 05/08/17  Yes Tat, Shanon Brow, MD  glipiZIDE (GLUCOTROL XL) 10 MG 24 hr tablet Take 1 tablet by mouth daily. 04/20/17  Yes [provider]  HUMALOG KWIKPEN 200 UNIT/ML SOPN Inject 0-15 Units into the skin 3 (three) times daily with meals. CBG 70-120--no units; 121-150--2 units; 151-200--3 units; 201-250--5 units; 251-300--8 units; 301-350--11 units; 351-400--15 units 05/08/17  Yes Tat, David, MD  insulin glargine (LANTUS) 100 UNIT/ML injection Inject 0.2 mLs (20 Units total) into the  skin at bedtime. Patient taking differently: Inject 65 Units into the skin at bedtime.  05/08/17  Yes Tat, Shanon Brow, MD  triamcinolone cream (KENALOG) 0.1 % APPLY TWICE DAILY 01/13/12  Yes Dunn, Areta Haber, PA-C  cephALEXin (KEFLEX) 500 MG capsule Take 1 capsule (500 mg total) by mouth 4 (four) times daily. 09/11/19   Milton Ferguson, MD  docusate sodium (COLACE) 100 MG capsule Take 1 capsule (100 mg total) by mouth 2 (two) times daily. Patient not taking: Reported on 09/11/2019 02/28/17   Dessa Phi, DO  metFORMIN (GLUCOPHAGE-XR) 500 MG 24 hr tablet Take 500 mg by mouth 2 (two) times daily. 07/18/19   [provider]  potassium chloride SA (K-DUR,KLOR-CON) 10 MEQ tablet Take 1 tablet (10 mEq total) by mouth daily. Patient not taking: Reported on 09/11/2019 05/09/17   Orson Eva, MD  prednisoLONE acetate (PRED FORTE) 1 % ophthalmic suspension Place 1 drop into both eyes 4 (four) times daily. 02/09/17   [provider]    Allergies    Ace inhibitors, Glipizide, Neosporin [neomycin-bacitracin zn-polymyx], Spironolactone, and Diovan [valsartan]  Review of Systems   Review of Systems  Constitutional: Negative for appetite change and fatigue.  HENT: Negative for congestion, ear discharge and sinus pressure.   Eyes: Negative for discharge.  Respiratory: Negative for cough.   Cardiovascular: Negative for chest pain.  Gastrointestinal: Negative for abdominal pain and diarrhea.  Genitourinary: Negative for frequency and hematuria.  Musculoskeletal: Negative for back pain.  Skin: Negative for rash.  Neurological: Negative for seizures and headaches.  Psychiatric/Behavioral: Negative for hallucinations.    Physical Exam Updated Vital Signs BP (!) 152/63   Pulse 79   Temp 98.3 F (36.8 C) (Oral)   Resp 18   Ht 5\' 1"  (1.549 m)   Wt 75.8 kg   SpO2 97%   BMI 31.55 kg/m   Physical Exam Vitals and nursing note reviewed.  Constitutional:      Appearance: She is well-developed.    HENT:     Head: Normocephalic.     Nose: Nose normal.  Eyes:     General: No scleral icterus.    Conjunctiva/sclera: Conjunctivae normal.  Neck:     Thyroid: No thyromegaly.  Cardiovascular:     Rate and Rhythm: Normal rate and regular rhythm.     Heart sounds: No murmur. No friction rub. No gallop.   Pulmonary:     Breath sounds: No stridor. No wheezing or rales.  Chest:     Chest wall: No tenderness.  Abdominal:     General: There is no distension.     Tenderness: There is no abdominal tenderness. There is no rebound.  Musculoskeletal:        General: Normal range of motion.     Cervical back: Neck supple.  Lymphadenopathy:     Cervical: No cervical adenopathy.  Skin:  Findings: No erythema or rash.  Neurological:     Mental Status: She is alert and oriented to person, place, and time.     Motor: No abnormal muscle tone.     Coordination: Coordination normal.  Psychiatric:        Behavior: Behavior normal.     ED Results / Procedures / Treatments   Labs (all labs ordered are listed, but only abnormal results are displayed) Labs Reviewed  COMPREHENSIVE METABOLIC PANEL - Abnormal; Notable for the following components:      Result Value   Potassium 3.4 (*)    Glucose, Bld 170 (*)    Calcium 8.5 (*)    Albumin 2.9 (*)    GFR calc non Af Amer 56 (*)    All other components within normal limits  URINALYSIS, ROUTINE W REFLEX MICROSCOPIC - Abnormal; Notable for the following components:   APPearance HAZY (*)    Nitrite POSITIVE (*)    Leukocytes,Ua SMALL (*)    Bacteria, UA FEW (*)    All other components within normal limits  URINE CULTURE  CBC WITH DIFFERENTIAL/PLATELET    EKG EKG Interpretation  Date/Time:  Sunday September 11 2019 10:25:55 EST Ventricular Rate:  77 PR Interval:    QRS Duration: 98 QT Interval:  508 QTC Calculation: 575 R Axis:   55 Text Interpretation: Sinus rhythm Anterior infarct, old Nonspecific T abnormalities, inferior leads  Prolonged QT interval Confirmed by Milton Ferguson [phone]) on 09/11/2019 2:15:06 PM   Radiology CT Head Wo Contrast  Result Date: 09/11/2019 CLINICAL DATA:  Multiple falls EXAM: CT HEAD WITHOUT CONTRAST CT CERVICAL SPINE WITHOUT CONTRAST TECHNIQUE: Multidetector CT imaging of the head and cervical spine was performed following the standard protocol without intravenous contrast. Multiplanar CT image reconstructions of the cervical spine were also generated. COMPARISON:  04/30/2017 FINDINGS: CT HEAD FINDINGS Brain: No evidence of acute infarction, hemorrhage, hydrocephalus, extra-axial collection or mass lesion/mass effect. Mild periventricular and deep white matter hypodensity. Vascular: No hyperdense vessel or unexpected calcification. Skull: Hyperostosis frontalis. Negative for fracture or focal lesion. Sinuses/Orbits: No acute finding. Other: None. CT CERVICAL SPINE FINDINGS Alignment: Normal. Skull base and vertebrae: No acute fracture. No primary bone lesion or focal pathologic process. Soft tissues and spinal canal: No prevertebral fluid or swelling. No visible canal hematoma. Disc levels: Moderate multilevel disc degenerative disease and osteophytosis of the cervical spine. Upper chest: Negative. Other: None. IMPRESSION: 1. No acute intracranial pathology. 2. No fracture or static subluxation of the cervical spine. 3. Disc degenerative disease of the cervical spine. Electronically Signed   By: Eddie Candle M.D.   On: 09/11/2019 12:30   CT Cervical Spine Wo Contrast  Result Date: 09/11/2019 CLINICAL DATA:  Multiple falls EXAM: CT HEAD WITHOUT CONTRAST CT CERVICAL SPINE WITHOUT CONTRAST TECHNIQUE: Multidetector CT imaging of the head and cervical spine was performed following the standard protocol without intravenous contrast. Multiplanar CT image reconstructions of the cervical spine were also generated. COMPARISON:  04/30/2017 FINDINGS: CT HEAD FINDINGS Brain: No evidence of acute infarction,  hemorrhage, hydrocephalus, extra-axial collection or mass lesion/mass effect. Mild periventricular and deep white matter hypodensity. Vascular: No hyperdense vessel or unexpected calcification. Skull: Hyperostosis frontalis. Negative for fracture or focal lesion. Sinuses/Orbits: No acute finding. Other: None. CT CERVICAL SPINE FINDINGS Alignment: Normal. Skull base and vertebrae: No acute fracture. No primary bone lesion or focal pathologic process. Soft tissues and spinal canal: No prevertebral fluid or swelling. No visible canal hematoma. Disc levels: Moderate multilevel disc degenerative  disease and osteophytosis of the cervical spine. Upper chest: Negative. Other: None. IMPRESSION: 1. No acute intracranial pathology. 2. No fracture or static subluxation of the cervical spine. 3. Disc degenerative disease of the cervical spine. Electronically Signed   By: Eddie Candle M.D.   On: 09/11/2019 12:30   DG Pelvis Portable  Result Date: 09/11/2019 CLINICAL DATA:  Fall with pelvic pain.  Initial encounter. EXAM: PORTABLE PELVIS 1-2 VIEWS COMPARISON:  08/31/2016 FINDINGS: No acute fracture or dislocation noted. Degenerative changes in both hips and LOWER lumbar spine again noted. No suspicious bony lesions are present. Surgical clips within the anatomic pelvis again noted. IMPRESSION: No evidence of acute abnormality. Electronically Signed   By: Margarette Canada M.D.   On: 09/11/2019 11:44   DG Chest Port 1 View  Result Date: 09/11/2019 CLINICAL DATA:  Fall and weakness. EXAM: PORTABLE CHEST 1 VIEW COMPARISON:  07/26/2019 FINDINGS: UPPER limits normal heart size and mild pulmonary vascular congestion noted. Mild LEFT basilar opacities are present. No definite pleural effusion or pneumothorax. No acute bony abnormality noted. IMPRESSION: UPPER normal heart with mild pulmonary vascular congestion Mild LEFT basilar opacities, favor atelectasis but airspace disease/pneumonia is not excluded. Electronically Signed   By:  Margarette Canada M.D.   On: 09/11/2019 11:42    Procedures Procedures (including critical care time)  Medications Ordered in ED Medications  cephALEXin (KEFLEX) capsule 500 mg (has no administration in time range)    ED Course  I have reviewed the triage vital signs and the nursing notes.  Pertinent labs & imaging results that were available during my care of the patient were reviewed by me and considered in my medical decision making (see chart for details).    MDM Rules/Calculators/A&P                     Patient with urinary tract infection.  She will placed on Keflex.  We have contacted social worker to evaluate home health situation. Final Clinical Impression(s) / ED Diagnoses Final diagnoses:  Fall, initial encounter  Acute cystitis without hematuria    Rx / DC Orders ED Discharge Orders         Ordered    cephALEXin (KEFLEX) 500 MG capsule  4 times daily     09/11/19 1424           Milton Ferguson, MD 09/11/19 1430

## 2019-09-11 NOTE — TOC Transition Note (Signed)
Transition of Care [facility]) - CM/SW Discharge Note   Transition of Care [facility]) - Emergency Department Mini Assessment   Patient Details  Name: [name] MRN: [id number] Date of Birth: 1931-04-20  Transition of Care [facility]) CM/SW Contact:    Sherald Barge, RN Phone Number: 09/11/2019, 2:11 PM   Clinical Narrative: [facility] consult for [facility]. Pt is from home, cared for by granddaughter and grandson. Per granddaughter pt falls frequently despite recently discharging from SNF. Granddaughter says pt has someone with her 24/7. Granddaughter says pt will refuse placement but they are willing to pay co-pay days (which pt would be in) if she returned to SNF. Pt makes close to 2K/months. She own's 2 homes, one she lives in and one she rents out. Pt has HH through [facility] (PT/OT) and pt's PCP has started placement process by faxing out FL2's, granddaughter unsure of level of care they are seeking. Pt has no medical necessity and will be DC'd from ED. MD will order [facility] RN and CSW, order faxed. TOC staff has made APS referral based on EMS report of the home with hopes they could also help expedite placement. TOC attempted call to granddaughter to discuss plan with no answer. Will await call back.     ED Mini Assessment: What brought you to the Emergency Department? : frequent falls     Patient Contact and Communications Key Contact 1: [name] Priddy   Spoke with: [name] and her husband  ,     Contact Phone Number: [phone]           Admission diagnosis:  Fall Patient Active Problem List   Diagnosis Date Noted  . Pneumonia due to COVID-19 virus 07/22/2019  . Acute on chronic diastolic CHF (congestive heart failure) ([place]) 05/06/2017  . Altered mental status   . Acute respiratory failure ([place]) 05/04/2017  . Hypoglycemia 04/30/2017  . Acute urinary retention 02/23/2017  . Chronic diastolic CHF (congestive heart failure) ([place])   . SBO (small bowel obstruction) ([place]) 02/22/2017  .  Hypertensive urgency 02/22/2017  . Leukocytosis 02/22/2017  . Hyponatremia 02/22/2017  . Dehydration 02/22/2017  . Dizziness 09/01/2016  . Weakness 09/01/2016  . Psoriasis vulgaris 09/01/2016  . Right hip pain 09/01/2016  . Diabetic foot ulcer ([place]) 09/01/2016  . Dyspnea 05/15/2016  . Congestive heart failure (CHF) ([place]) 05/15/2016  . Rash and nonspecific skin eruption 05/15/2016  . Essential hypertension 05/15/2016  . Uncontrolled type 2 diabetes mellitus with hyperglycemia ([place]) 05/15/2016  . Anxiety 05/15/2016  . Acute respiratory failure with hypoxia ([place]) 05/15/2016   PCP:  [name], MD Pharmacy:   [id number] - [place], [place] [address] Phone: [phone] Fax: [phone]  [id number] - [place], [place] [id number] [place] [id number] Phone: [phone] Fax: [phone]  [place], [place] [id number] [place] [id number] Phone: [phone] Fax: [phone]

## 2019-09-13 DIAGNOSIS — Z7952 Long term (current) use of systemic steroids: Secondary | ICD-10-CM | POA: Diagnosis not present

## 2019-09-13 DIAGNOSIS — Z85038 Personal history of other malignant neoplasm of large intestine: Secondary | ICD-10-CM | POA: Diagnosis not present

## 2019-09-13 DIAGNOSIS — I110 Hypertensive heart disease with heart failure: Secondary | ICD-10-CM | POA: Diagnosis not present

## 2019-09-13 DIAGNOSIS — Z794 Long term (current) use of insulin: Secondary | ICD-10-CM | POA: Diagnosis not present

## 2019-09-13 DIAGNOSIS — Z87440 Personal history of urinary (tract) infections: Secondary | ICD-10-CM | POA: Diagnosis not present

## 2019-09-13 DIAGNOSIS — N390 Urinary tract infection, site not specified: Secondary | ICD-10-CM | POA: Diagnosis not present

## 2019-09-13 DIAGNOSIS — Z9181 History of falling: Secondary | ICD-10-CM | POA: Diagnosis not present

## 2019-09-13 DIAGNOSIS — I5032 Chronic diastolic (congestive) heart failure: Secondary | ICD-10-CM | POA: Diagnosis not present

## 2019-09-13 DIAGNOSIS — M1990 Unspecified osteoarthritis, unspecified site: Secondary | ICD-10-CM | POA: Diagnosis not present

## 2019-09-13 DIAGNOSIS — E1136 Type 2 diabetes mellitus with diabetic cataract: Secondary | ICD-10-CM | POA: Diagnosis not present

## 2019-09-14 ENCOUNTER — Encounter (HOSPITAL_COMMUNITY): Payer: Self-pay | Admitting: *Deleted

## 2019-09-14 ENCOUNTER — Inpatient Hospital Stay (HOSPITAL_COMMUNITY)
Admission: EM | Admit: 2019-09-14 | Discharge: 2019-09-19 | DRG: 291 | Disposition: A | Discharge status: Skilled Nursing Facility | Payer: Medicare Other | Attending: Family Medicine | Admitting: Family Medicine

## 2019-09-14 ENCOUNTER — Emergency Department (HOSPITAL_COMMUNITY): Payer: Medicare Other

## 2019-09-14 ENCOUNTER — Other Ambulatory Visit: Payer: Self-pay

## 2019-09-14 DIAGNOSIS — B9620 Unspecified Escherichia coli [E. coli] as the cause of diseases classified elsewhere: Secondary | ICD-10-CM | POA: Diagnosis present

## 2019-09-14 DIAGNOSIS — Z90710 Acquired absence of both cervix and uterus: Secondary | ICD-10-CM

## 2019-09-14 DIAGNOSIS — L400 Psoriasis vulgaris: Secondary | ICD-10-CM | POA: Diagnosis present

## 2019-09-14 DIAGNOSIS — F419 Anxiety disorder, unspecified: Secondary | ICD-10-CM | POA: Diagnosis present

## 2019-09-14 DIAGNOSIS — M25551 Pain in right hip: Secondary | ICD-10-CM | POA: Diagnosis not present

## 2019-09-14 DIAGNOSIS — F0391 Unspecified dementia with behavioral disturbance: Secondary | ICD-10-CM | POA: Diagnosis not present

## 2019-09-14 DIAGNOSIS — E1136 Type 2 diabetes mellitus with diabetic cataract: Secondary | ICD-10-CM | POA: Diagnosis present

## 2019-09-14 DIAGNOSIS — Z9049 Acquired absence of other specified parts of digestive tract: Secondary | ICD-10-CM | POA: Diagnosis not present

## 2019-09-14 DIAGNOSIS — Z79899 Other long term (current) drug therapy: Secondary | ICD-10-CM

## 2019-09-14 DIAGNOSIS — Z794 Long term (current) use of insulin: Secondary | ICD-10-CM

## 2019-09-14 DIAGNOSIS — L270 Generalized skin eruption due to drugs and medicaments taken internally: Secondary | ICD-10-CM

## 2019-09-14 DIAGNOSIS — Z6831 Body mass index (BMI) 31.0-31.9, adult: Secondary | ICD-10-CM | POA: Diagnosis not present

## 2019-09-14 DIAGNOSIS — Z8616 Personal history of COVID-19: Secondary | ICD-10-CM | POA: Diagnosis not present

## 2019-09-14 DIAGNOSIS — R296 Repeated falls: Secondary | ICD-10-CM | POA: Diagnosis not present

## 2019-09-14 DIAGNOSIS — T50995A Adverse effect of other drugs, medicaments and biological substances, initial encounter: Secondary | ICD-10-CM | POA: Diagnosis not present

## 2019-09-14 DIAGNOSIS — Z823 Family history of stroke: Secondary | ICD-10-CM | POA: Diagnosis not present

## 2019-09-14 DIAGNOSIS — F05 Delirium due to known physiological condition: Secondary | ICD-10-CM | POA: Diagnosis not present

## 2019-09-14 DIAGNOSIS — I110 Hypertensive heart disease with heart failure: Principal | ICD-10-CM | POA: Diagnosis present

## 2019-09-14 DIAGNOSIS — W19XXXA Unspecified fall, initial encounter: Secondary | ICD-10-CM | POA: Diagnosis not present

## 2019-09-14 DIAGNOSIS — Z881 Allergy status to other antibiotic agents status: Secondary | ICD-10-CM

## 2019-09-14 DIAGNOSIS — Z9181 History of falling: Secondary | ICD-10-CM | POA: Diagnosis not present

## 2019-09-14 DIAGNOSIS — R5381 Other malaise: Secondary | ICD-10-CM | POA: Diagnosis not present

## 2019-09-14 DIAGNOSIS — Z85038 Personal history of other malignant neoplasm of large intestine: Secondary | ICD-10-CM | POA: Diagnosis not present

## 2019-09-14 DIAGNOSIS — J189 Pneumonia, unspecified organism: Secondary | ICD-10-CM | POA: Diagnosis not present

## 2019-09-14 DIAGNOSIS — N390 Urinary tract infection, site not specified: Secondary | ICD-10-CM | POA: Diagnosis present

## 2019-09-14 DIAGNOSIS — I5033 Acute on chronic diastolic (congestive) heart failure: Secondary | ICD-10-CM | POA: Diagnosis present

## 2019-09-14 DIAGNOSIS — E119 Type 2 diabetes mellitus without complications: Secondary | ICD-10-CM | POA: Diagnosis not present

## 2019-09-14 DIAGNOSIS — Z743 Need for continuous supervision: Secondary | ICD-10-CM | POA: Diagnosis not present

## 2019-09-14 DIAGNOSIS — J811 Chronic pulmonary edema: Secondary | ICD-10-CM | POA: Diagnosis not present

## 2019-09-14 DIAGNOSIS — Z7401 Bed confinement status: Secondary | ICD-10-CM | POA: Diagnosis not present

## 2019-09-14 DIAGNOSIS — H9190 Unspecified hearing loss, unspecified ear: Secondary | ICD-10-CM | POA: Diagnosis present

## 2019-09-14 DIAGNOSIS — Z993 Dependence on wheelchair: Secondary | ICD-10-CM | POA: Diagnosis not present

## 2019-09-14 DIAGNOSIS — R531 Weakness: Secondary | ICD-10-CM | POA: Diagnosis not present

## 2019-09-14 DIAGNOSIS — R21 Rash and other nonspecific skin eruption: Secondary | ICD-10-CM | POA: Diagnosis not present

## 2019-09-14 DIAGNOSIS — Z888 Allergy status to other drugs, medicaments and biological substances status: Secondary | ICD-10-CM

## 2019-09-14 DIAGNOSIS — Z66 Do not resuscitate: Secondary | ICD-10-CM | POA: Diagnosis present

## 2019-09-14 DIAGNOSIS — Z8719 Personal history of other diseases of the digestive system: Secondary | ICD-10-CM | POA: Diagnosis not present

## 2019-09-14 DIAGNOSIS — R627 Adult failure to thrive: Secondary | ICD-10-CM | POA: Diagnosis not present

## 2019-09-14 DIAGNOSIS — M6281 Muscle weakness (generalized): Secondary | ICD-10-CM | POA: Diagnosis not present

## 2019-09-14 DIAGNOSIS — Z833 Family history of diabetes mellitus: Secondary | ICD-10-CM

## 2019-09-14 DIAGNOSIS — Z883 Allergy status to other anti-infective agents status: Secondary | ICD-10-CM

## 2019-09-14 LAB — URINE CULTURE: Culture: >=100000 — AB

## 2019-09-14 LAB — CBC WITH DIFFERENTIAL/PLATELET
Abs Immature Granulocytes: 0.06 10*3/uL (ref 0.00–0.07)
Basophils Absolute: 0 10*3/uL (ref 0.0–0.1)
Basophils Relative: 0 %
Eosinophils Absolute: 0.5 10*3/uL (ref 0.0–0.5)
Eosinophils Relative: 5 %
HCT: 42.6 % (ref 36.0–46.0)
Hemoglobin: 13.4 g/dL (ref 12.0–15.0)
Immature Granulocytes: 1 %
Lymphocytes Relative: 11 %
Lymphs Abs: 1.2 10*3/uL (ref 0.7–4.0)
MCH: 28.8 pg (ref 26.0–34.0)
MCHC: 31.5 g/dL (ref 30.0–36.0)
MCV: 91.6 fL (ref 80.0–100.0)
Monocytes Absolute: 0.7 10*3/uL (ref 0.1–1.0)
Monocytes Relative: 6 %
Neutro Abs: 8.9 10*3/uL — ABNORMAL HIGH (ref 1.7–7.7)
Neutrophils Relative %: 77 %
Platelets: 330 10*3/uL (ref 150–400)
RBC: 4.65 MIL/uL (ref 3.87–5.11)
RDW: 15.6 % — ABNORMAL HIGH (ref 11.5–15.5)
WBC: 11.4 10*3/uL — ABNORMAL HIGH (ref 4.0–10.5)
nRBC: 0 % (ref 0.0–0.2)

## 2019-09-14 LAB — COMPREHENSIVE METABOLIC PANEL
ALT: 13 U/L (ref 0–44)
AST: 21 U/L (ref 15–41)
Albumin: 2.8 g/dL — ABNORMAL LOW (ref 3.5–5.0)
Alkaline Phosphatase: 60 U/L (ref 38–126)
Anion gap: 10 (ref 5–15)
BUN: 19 mg/dL (ref 8–23)
CO2: 23 mmol/L (ref 22–32)
Calcium: 8 mg/dL — ABNORMAL LOW (ref 8.9–10.3)
Chloride: 106 mmol/L (ref 98–111)
Creatinine, Ser: 1.08 mg/dL — ABNORMAL HIGH (ref 0.44–1.00)
GFR calc Af Amer: 54 mL/min — ABNORMAL LOW (ref >60)
GFR calc non Af Amer: 46 mL/min — ABNORMAL LOW (ref >60)
Glucose, Bld: 172 mg/dL — ABNORMAL HIGH (ref 70–99)
Potassium: 3.5 mmol/L (ref 3.5–5.1)
Sodium: 139 mmol/L (ref 135–145)
Total Bilirubin: 0.8 mg/dL (ref 0.3–1.2)
Total Protein: 6.7 g/dL (ref 6.5–8.1)

## 2019-09-14 LAB — TROPONIN I (HIGH SENSITIVITY)
Troponin I (High Sensitivity): 18 ng/L — ABNORMAL HIGH (ref <18)
Troponin I (High Sensitivity): 18 ng/L — ABNORMAL HIGH (ref <18)

## 2019-09-14 LAB — LACTATE DEHYDROGENASE: LDH: 207 U/L — ABNORMAL HIGH (ref 98–192)

## 2019-09-14 LAB — BRAIN NATRIURETIC PEPTIDE: B Natriuretic Peptide: 97 pg/mL (ref 0.0–100.0)

## 2019-09-14 LAB — LACTIC ACID, PLASMA: Lactic Acid, Venous: 1.9 mmol/L (ref 0.5–1.9)

## 2019-09-14 MED ORDER — SODIUM CHLORIDE 0.9 % IV SOLN: 2.0000 g | Freq: Once | INTRAVENOUS | Status: DC

## 2019-09-14 MED ORDER — FAMOTIDINE IN NACL 20-0.9 MG/50ML-% IV SOLN
20.0000 mg | Freq: Once | INTRAVENOUS | Status: AC
  Administered 2019-09-14: 20 mg via INTRAVENOUS
  Filled 2019-09-14: qty 50

## 2019-09-14 MED ORDER — FUROSEMIDE 10 MG/ML IJ SOLN
40.0000 mg | Freq: Once | INTRAMUSCULAR | Status: AC
  Administered 2019-09-14: 40 mg via INTRAVENOUS
  Filled 2019-09-14: qty 4

## 2019-09-14 MED ORDER — SODIUM CHLORIDE 0.9 % IV SOLN: 500.0000 mg | Freq: Once | INTRAVENOUS | Status: DC

## 2019-09-14 MED ORDER — ALBUTEROL SULFATE HFA 108 (90 BASE) MCG/ACT IN AERS
2.0000 | INHALATION_SPRAY | Freq: Once | RESPIRATORY_TRACT | Status: AC
  Administered 2019-09-14: 2 via RESPIRATORY_TRACT
  Filled 2019-09-14: qty 6.7

## 2019-09-14 MED ORDER — LEVOFLOXACIN IN D5W 750 MG/150ML IV SOLN
750.0000 mg | Freq: Once | INTRAVENOUS | Status: AC
  Administered 2019-09-14: 750 mg via INTRAVENOUS
  Filled 2019-09-14: qty 150

## 2019-09-14 NOTE — ED Triage Notes (Signed)
Pt returns to er for multiple falls, family states that the pt was seen in er on Sunday for same, has continued to have falls, pt has rash noted to body area, family reports that pt was placed on keflex when she was here Sunday.
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
## 2019-09-15 ENCOUNTER — Encounter (HOSPITAL_COMMUNITY): Payer: Self-pay | Admitting: Family Medicine

## 2019-09-15 DIAGNOSIS — J189 Pneumonia, unspecified organism: Secondary | ICD-10-CM

## 2019-09-15 DIAGNOSIS — R627 Adult failure to thrive: Secondary | ICD-10-CM

## 2019-09-15 DIAGNOSIS — W19XXXA Unspecified fall, initial encounter: Secondary | ICD-10-CM

## 2019-09-15 DIAGNOSIS — L270 Generalized skin eruption due to drugs and medicaments taken internally: Secondary | ICD-10-CM

## 2019-09-15 DIAGNOSIS — R531 Weakness: Secondary | ICD-10-CM

## 2019-09-15 DIAGNOSIS — N390 Urinary tract infection, site not specified: Secondary | ICD-10-CM

## 2019-09-15 LAB — COMPREHENSIVE METABOLIC PANEL
ALT: 14 U/L (ref 0–44)
AST: 24 U/L (ref 15–41)
Albumin: 2.6 g/dL — ABNORMAL LOW (ref 3.5–5.0)
Alkaline Phosphatase: 54 U/L (ref 38–126)
Anion gap: 9 (ref 5–15)
BUN: 18 mg/dL (ref 8–23)
CO2: 26 mmol/L (ref 22–32)
Calcium: 8 mg/dL — ABNORMAL LOW (ref 8.9–10.3)
Chloride: 104 mmol/L (ref 98–111)
Creatinine, Ser: 0.97 mg/dL (ref 0.44–1.00)
GFR calc Af Amer: >60 mL/min (ref >60)
GFR calc non Af Amer: 53 mL/min — ABNORMAL LOW (ref >60)
Glucose, Bld: 121 mg/dL — ABNORMAL HIGH (ref 70–99)
Potassium: 3.4 mmol/L — ABNORMAL LOW (ref 3.5–5.1)
Sodium: 139 mmol/L (ref 135–145)
Total Bilirubin: 0.7 mg/dL (ref 0.3–1.2)
Total Protein: 6.4 g/dL — ABNORMAL LOW (ref 6.5–8.1)

## 2019-09-15 LAB — CBC
HCT: 38.8 % (ref 36.0–46.0)
Hemoglobin: 12.3 g/dL (ref 12.0–15.0)
MCH: 28.8 pg (ref 26.0–34.0)
MCHC: 31.7 g/dL (ref 30.0–36.0)
MCV: 90.9 fL (ref 80.0–100.0)
Platelets: 302 10*3/uL (ref 150–400)
RBC: 4.27 MIL/uL (ref 3.87–5.11)
RDW: 15.2 % (ref 11.5–15.5)
WBC: 11.5 10*3/uL — ABNORMAL HIGH (ref 4.0–10.5)
nRBC: 0 % (ref 0.0–0.2)

## 2019-09-15 LAB — GLUCOSE, CAPILLARY
Glucose-Capillary: 85 mg/dL (ref 70–99)
Glucose-Capillary: 80 mg/dL (ref 70–99)
Glucose-Capillary: 110 mg/dL — ABNORMAL HIGH (ref 70–99)
Glucose-Capillary: 102 mg/dL — ABNORMAL HIGH (ref 70–99)

## 2019-09-15 LAB — SARS CORONAVIRUS 2 (TAT 6-24 HRS): SARS Coronavirus 2: NEGATIVE

## 2019-09-15 LAB — HEMOGLOBIN A1C
Hgb A1c MFr Bld: 7.2 % — ABNORMAL HIGH (ref 4.8–5.6)
Mean Plasma Glucose: 159.94 mg/dL

## 2019-09-15 MED ORDER — ALPRAZOLAM 0.5 MG PO TABS
0.5000 mg | ORAL_TABLET | Freq: Two times a day (BID) | ORAL | Status: DC | PRN
  Administered 2019-09-15: 0.5 mg via ORAL
  Filled 2019-09-15 (×2): qty 1

## 2019-09-15 MED ORDER — POTASSIUM CHLORIDE CRYS ER 10 MEQ PO TBCR
10.0000 meq | EXTENDED_RELEASE_TABLET | Freq: Every day | ORAL | Status: DC
  Administered 2019-09-17 – 2019-09-19 (×3): 10 meq via ORAL
  Filled 2019-09-15 (×4): qty 1

## 2019-09-15 MED ORDER — FUROSEMIDE 40 MG PO TABS
40.0000 mg | ORAL_TABLET | Freq: Every morning | ORAL | Status: DC
  Administered 2019-09-16 – 2019-09-19 (×4): 40 mg via ORAL
  Filled 2019-09-15 (×5): qty 1

## 2019-09-15 MED ORDER — ACETAMINOPHEN 325 MG PO TABS
650.0000 mg | ORAL_TABLET | Freq: Four times a day (QID) | ORAL | Status: DC | PRN
  Administered 2019-09-15: 650 mg via ORAL
  Filled 2019-09-15: qty 2

## 2019-09-15 MED ORDER — ONDANSETRON HCL 4 MG/2ML IJ SOLN: 4.0000 mg | Freq: Four times a day (QID) | INTRAMUSCULAR | Status: DC | PRN

## 2019-09-15 MED ORDER — LORAZEPAM 2 MG/ML IJ SOLN
1.0000 mg | Freq: Once | INTRAMUSCULAR | Status: AC
  Administered 2019-09-15: 1 mg via INTRAMUSCULAR
  Filled 2019-09-15: qty 1

## 2019-09-15 MED ORDER — LEVOFLOXACIN IN D5W 750 MG/150ML IV SOLN
750.0000 mg | INTRAVENOUS | Status: DC
  Administered 2019-09-16: 750 mg via INTRAVENOUS
  Filled 2019-09-15: qty 150

## 2019-09-15 MED ORDER — PREDNISOLONE ACETATE 1 % OP SUSP
1.0000 [drp] | Freq: Four times a day (QID) | OPHTHALMIC | Status: DC
  Administered 2019-09-15 – 2019-09-19 (×15): 1 [drp] via OPHTHALMIC
  Filled 2019-09-15: qty 1

## 2019-09-15 MED ORDER — CARVEDILOL 3.125 MG PO TABS
6.2500 mg | ORAL_TABLET | Freq: Two times a day (BID) | ORAL | Status: DC
  Administered 2019-09-16 – 2019-09-19 (×7): 6.25 mg via ORAL
  Filled 2019-09-15 (×8): qty 2

## 2019-09-15 MED ORDER — HALOPERIDOL LACTATE 5 MG/ML IJ SOLN
2.0000 mg | Freq: Four times a day (QID) | INTRAMUSCULAR | Status: DC | PRN
  Administered 2019-09-15 – 2019-09-17 (×2): 2 mg via INTRAMUSCULAR
  Filled 2019-09-15 (×2): qty 1

## 2019-09-15 MED ORDER — ACETAMINOPHEN 650 MG RE SUPP: 650.0000 mg | Freq: Four times a day (QID) | RECTAL | Status: DC | PRN

## 2019-09-15 MED ORDER — ONDANSETRON HCL 4 MG PO TABS: 4.0000 mg | ORAL_TABLET | Freq: Four times a day (QID) | ORAL | Status: DC | PRN

## 2019-09-15 MED ORDER — INSULIN ASPART 100 UNIT/ML ~~LOC~~ SOLN
0.0000 [IU] | Freq: Three times a day (TID) | SUBCUTANEOUS | Status: DC
  Administered 2019-09-16 – 2019-09-17 (×3): 2 [IU] via SUBCUTANEOUS
  Administered 2019-09-17: 1 [IU] via SUBCUTANEOUS
  Administered 2019-09-18 (×2): 3 [IU] via SUBCUTANEOUS
  Administered 2019-09-18: 2 [IU] via SUBCUTANEOUS
  Administered 2019-09-19: 3 [IU] via SUBCUTANEOUS
  Administered 2019-09-19: 1 [IU] via SUBCUTANEOUS

## 2019-09-15 MED ORDER — DOCUSATE SODIUM 100 MG PO CAPS
100.0000 mg | ORAL_CAPSULE | Freq: Two times a day (BID) | ORAL | Status: DC
  Administered 2019-09-15 – 2019-09-19 (×8): 100 mg via ORAL
  Filled 2019-09-15 (×10): qty 1

## 2019-09-15 MED ORDER — ALPRAZOLAM 0.5 MG PO TABS
0.5000 mg | ORAL_TABLET | Freq: Three times a day (TID) | ORAL | Status: DC | PRN
  Administered 2019-09-17: 0.5 mg via ORAL
  Filled 2019-09-15: qty 1

## 2019-09-15 MED ORDER — INSULIN GLARGINE 100 UNIT/ML ~~LOC~~ SOLN
65.0000 [IU] | Freq: Every day | SUBCUTANEOUS | Status: DC
  Filled 2019-09-15: qty 0.65

## 2019-09-15 MED ORDER — AMLODIPINE BESYLATE 5 MG PO TABS
10.0000 mg | ORAL_TABLET | Freq: Every morning | ORAL | Status: DC
  Administered 2019-09-16 – 2019-09-19 (×4): 10 mg via ORAL
  Filled 2019-09-15 (×5): qty 2

## 2019-09-15 MED ORDER — ENOXAPARIN SODIUM 40 MG/0.4ML ~~LOC~~ SOLN
40.0000 mg | Freq: Every day | SUBCUTANEOUS | Status: DC
  Administered 2019-09-15 – 2019-09-19 (×5): 40 mg via SUBCUTANEOUS
  Filled 2019-09-15 (×7): qty 0.4

## 2019-09-15 MED ORDER — OXYCODONE HCL 5 MG PO TABS
5.0000 mg | ORAL_TABLET | Freq: Once | ORAL | Status: AC
  Administered 2019-09-15: 5 mg via ORAL
  Filled 2019-09-15: qty 1

## 2019-09-15 MED ORDER — INSULIN GLARGINE 100 UNIT/ML ~~LOC~~ SOLN
25.0000 [IU] | Freq: Every day | SUBCUTANEOUS | Status: DC
  Administered 2019-09-16 – 2019-09-18 (×3): 25 [IU] via SUBCUTANEOUS
  Filled 2019-09-15 (×5): qty 0.25

## 2019-09-15 NOTE — Progress Notes (Signed)
Patient is still very anxious and physically shaking. Patient complains of lower back pain. Patient in sitting position in bed and heat packs to lower back and this nurse gave tylenol. MD made aware.
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
## 2019-09-15 NOTE — Progress Notes (Signed)
ASSUMPTION OF CARE NOTE   09/15/2019 5:46 PM  [name] was seen and examined.  The H&P by the admitting provider, orders, imaging was reviewed.  Please see new orders.  Will continue to follow.   Vitals:   09/15/19 0652 09/15/19 1427  BP: (!) 101/57 (!) 148/53  Pulse: 87 88  Resp: 20 18  Temp: 98.6 F (37 C) 98.2 F (36.8 C)  SpO2: 98% 93%    Results for orders placed or performed during the hospital encounter of 09/14/19  Blood culture (routine x 2)   Specimen: BLOOD RIGHT HAND  Result Value Ref Range   Specimen Description BLOOD RIGHT HAND    Special Requests      BOTTLES DRAWN AEROBIC AND ANAEROBIC Blood Culture adequate volume   Culture      NO GROWTH < 12 HOURS Performed at [facility], [address] [place], [place] [address]    Report Status PENDING   Blood culture (routine x 2)   Specimen: BLOOD LEFT HAND  Result Value Ref Range   Specimen Description BLOOD LEFT HAND    Special Requests      BOTTLES DRAWN AEROBIC AND ANAEROBIC Blood Culture adequate volume   Culture      NO GROWTH < 12 HOURS Performed at [facility], [address], [place], [place] [address]    Report Status PENDING   SARS CORONAVIRUS 2 (TAT 6-24 HRS) Nasopharyngeal Nasopharyngeal Swab   Specimen: Nasopharyngeal Swab  Result Value Ref Range   SARS Coronavirus 2 NEGATIVE NEGATIVE  CBC with Differential  Result Value Ref Range   WBC 11.4 (H) 4.0 - 10.5 K/uL   RBC 4.65 3.87 - 5.11 MIL/uL   Hemoglobin 13.4 12.0 - 15.0 g/dL   HCT 42.6 36.0 - 46.0 %   MCV 91.6 80.0 - 100.0 fL   MCH 28.8 26.0 - 34.0 pg   MCHC 31.5 30.0 - 36.0 g/dL   RDW 15.6 (H) 11.5 - 15.5 %   Platelets 330 150 - 400 K/uL   nRBC 0.0 0.0 - 0.2 %   Neutrophils Relative % 77 %   Neutro Abs 8.9 (H) 1.7 - 7.7 K/uL   Lymphocytes Relative 11 %   Lymphs Abs 1.2 0.7 - 4.0 K/uL   Monocytes Relative 6 %   Monocytes Absolute 0.7 0.1 - 1.0 K/uL   Eosinophils Relative 5 %   Eosinophils Absolute 0.5 0.0 - 0.5 K/uL   Basophils  Relative 0 %   Basophils Absolute 0.0 0.0 - 0.1 K/uL   Immature Granulocytes 1 %   Abs Immature Granulocytes 0.06 0.00 - 0.07 K/uL  Comprehensive metabolic panel  Result Value Ref Range   Sodium 139 135 - 145 mmol/L   Potassium 3.5 3.5 - 5.1 mmol/L   Chloride 106 98 - 111 mmol/L   CO2 23 22 - 32 mmol/L   Glucose, Bld 172 (H) 70 - 99 mg/dL   BUN 19 8 - 23 mg/dL   Creatinine, Ser 1.08 (H) 0.44 - 1.00 mg/dL   Calcium 8.0 (L) 8.9 - 10.3 mg/dL   Total Protein 6.7 6.5 - 8.1 g/dL   Albumin 2.8 (L) 3.5 - 5.0 g/dL   AST 21 15 - 41 U/L   ALT 13 0 - 44 U/L   Alkaline Phosphatase 60 38 - 126 U/L   Total Bilirubin 0.8 0.3 - 1.2 mg/dL   GFR calc non Af Amer 46 (L) >60 mL/min   GFR calc Af Amer 54 (L) >60  mL/min   Anion gap 10 5 - 15  Brain natriuretic peptide  Result Value Ref Range   B Natriuretic Peptide 97.0 0.0 - 100.0 pg/mL  Lactate dehydrogenase  Result Value Ref Range   LDH 207 (H) 98 - 192 U/L  Lactic acid, plasma  Result Value Ref Range   Lactic Acid, Venous 1.9 0.5 - 1.9 mmol/L  Hemoglobin A1c  Result Value Ref Range   Hgb A1c MFr Bld 7.2 (H) 4.8 - 5.6 %   Mean Plasma Glucose 159.94 mg/dL  CBC  Result Value Ref Range   WBC 11.5 (H) 4.0 - 10.5 K/uL   RBC 4.27 3.87 - 5.11 MIL/uL   Hemoglobin 12.3 12.0 - 15.0 g/dL   HCT 38.8 36.0 - 46.0 %   MCV 90.9 80.0 - 100.0 fL   MCH 28.8 26.0 - 34.0 pg   MCHC 31.7 30.0 - 36.0 g/dL   RDW 15.2 11.5 - 15.5 %   Platelets 302 150 - 400 K/uL   nRBC 0.0 0.0 - 0.2 %  Comprehensive metabolic panel  Result Value Ref Range   Sodium 139 135 - 145 mmol/L   Potassium 3.4 (L) 3.5 - 5.1 mmol/L   Chloride 104 98 - 111 mmol/L   CO2 26 22 - 32 mmol/L   Glucose, Bld 121 (H) 70 - 99 mg/dL   BUN 18 8 - 23 mg/dL   Creatinine, Ser 0.97 0.44 - 1.00 mg/dL   Calcium 8.0 (L) 8.9 - 10.3 mg/dL   Total Protein 6.4 (L) 6.5 - 8.1 g/dL   Albumin 2.6 (L) 3.5 - 5.0 g/dL   AST 24 15 - 41 U/L   ALT 14 0 - 44 U/L   Alkaline Phosphatase 54 38 - 126 U/L   Total  Bilirubin 0.7 0.3 - 1.2 mg/dL   GFR calc non Af Amer 53 (L) >60 mL/min   GFR calc Af Amer >60 >60 mL/min   Anion gap 9 5 - 15  Glucose, capillary  Result Value Ref Range   Glucose-Capillary 85 70 - 99 mg/dL  Glucose, capillary  Result Value Ref Range   Glucose-Capillary 80 70 - 99 mg/dL  Glucose, capillary  Result Value Ref Range   Glucose-Capillary 110 (H) 70 - 99 mg/dL   Comment 1 Notify RN    Comment 2 Document in Chart   Troponin I (High Sensitivity)  Result Value Ref Range   Troponin I (High Sensitivity) 18 (H) <18 ng/L  Troponin I (High Sensitivity)  Result Value Ref Range   Troponin I (High Sensitivity) 18 (H) <18 ng/L     C. Wynetta Emery, MD [facility]   09/14/2019  4:03 PM How to contact the [facility] Attending or Consulting provider [place] or covering provider during after hours [place], for this patient?  1. Check the care team in [facility] and look for a) attending/consulting TR[facility] provider listed and b) the [facility] team listed 2. Log into [email or web address] and use [place] universal password to access. If you do not have the password, please contact the hospital operator. 3. Locate the [facility] provider you are looking for under [facility] and page to a number that you can be directly reached. 4. If you still have difficulty reaching the provider, please page the [facility] (Director on Call) for the Hospitalists listed on amion for assistance.

## 2019-09-15 NOTE — Progress Notes (Signed)
Patient had not voided. Bladder scan volume was 336 ml per her assigned RN. Dr. Wynetta Emery notified. States will place I&O catheterization order for as needed, no need to cath at this time. Night shift RN will be notified to monitor.

## 2019-09-15 NOTE — Progress Notes (Signed)
Pharmacy Antibiotic Note  [name] is a 86 y.o. female admitted on 09/14/2019 with CAP.  Pharmacy has been consulted for Levaquin dosing.  Plan: Levaquin 750mg  IV q48h Will f/u renal function, micro data, and pt's clinical condition  Height: 5\' 1"  (154.9 cm) Weight: 167 lb (75.8 kg) IBW/kg (Calculated) : 47.8  Temp (24hrs), Avg:98.2 F (36.8 C), Min:98.2 F (36.8 C), Max:98.2 F (36.8 C)  Recent Labs  Lab 09/11/19 1134 09/14/19 2021  WBC 8.3 11.4*  CREATININE 0.92 1.08*  LATICACIDVEN  --  1.9    Estimated Creatinine Clearance: 34.8 mL/min (A) (by C-G formula based on SCr of 1.08 mg/dL (H)).    Allergies  Allergen Reactions  . Ace Inhibitors Dermatitis  . Glipizide     rash  . Neosporin [Neomycin-Bacitracin Zn-Polymyx] Dermatitis  . Spironolactone     rash  . Diovan [Valsartan] Rash  . Keflex [Cephalexin] Rash    Antimicrobials this admission: 3/4 Levaquin >>   Microbiology results: 3/3 BCx:   Thank you for allowing pharmacy to be a part of this patient's care.  Sherlon Handing, PharmD, BCPS Please see amion for complete clinical pharmacist phone list 09/15/2019 12:07 AM

## 2019-09-15 NOTE — Clinical Social Work Note (Addendum)
Spoke with patient's granddaughter, [name], regarding discharge plan. [name] said "She is not going to be able to come home" due to multiple falls.  Patient previously at [facility] for 21 days after being released from the hospital in January. Family has started the Medicaid process on Monday.  Discussed patient being in copay days.  [name] request that referrals be sent to facility in county and out of county.   Alleta Avery, Clydene Pugh, LCSW

## 2019-09-15 NOTE — ED Notes (Signed)
Report given to Amy RN.

## 2019-09-15 NOTE — Progress Notes (Signed)
Patient is very anxious at this time. Patient wants her granddaughter to come stay with her. This nurse spoke to her granddaughter [name].[name] reports she has to work today and her husband will be coming to be with her grandmother. This nurse made patient aware, patient does not comprehend. Patient states " she knows I am sick, why is she doing this to me". Patient continues to look for walker to try to get out of bed. This nurse spoke with patient about risk of falling and that she needs to stay in bed for now until PT can evaluate her today. MD made aware. PRN ativan given as ordered.

## 2019-09-15 NOTE — NC FL2 (Signed)
[place] LEVEL OF CARE SCREENING TOOL     IDENTIFICATION  Patient Name: [name] Birthdate: 1931-05-17 Sex: female Admission Date (Current Location): 09/14/2019  [facility] and [place] Number:  [employer] and Address:  [facility] [address] [place]      Provider Number: [id number]  Attending Physician Name and Address:  Murlean Iba, MD  Relative Name and Phone Number:       Current Level of Care: Hospital Recommended Level of Care: [place] Prior Approval Number:    Date Approved/Denied:   PASRR Number: [id number]  Discharge Plan: SNF    Current Diagnoses: Patient Active Problem List   Diagnosis Date Noted  . UTI (urinary tract infection) 09/14/2019  . Pneumonia due to COVID-19 virus 07/22/2019  . Acute on chronic diastolic CHF (congestive heart failure) ([place]) 05/06/2017  . Altered mental status   . Acute respiratory failure ([place]) 05/04/2017  . Hypoglycemia 04/30/2017  . Acute urinary retention 02/23/2017  . Chronic diastolic CHF (congestive heart failure) ([place])   . SBO (small bowel obstruction) ([place]) 02/22/2017  . Hypertensive urgency 02/22/2017  . Leukocytosis 02/22/2017  . Hyponatremia 02/22/2017  . Dehydration 02/22/2017  . Dizziness 09/01/2016  . Weakness 09/01/2016  . Psoriasis vulgaris 09/01/2016  . Right hip pain 09/01/2016  . Diabetic foot ulcer ([place]) 09/01/2016  . Dyspnea 05/15/2016  . Congestive heart failure (CHF) ([place]) 05/15/2016  . Rash and nonspecific skin eruption 05/15/2016  . Essential hypertension 05/15/2016  . Uncontrolled type 2 diabetes mellitus with hyperglycemia ([place]) 05/15/2016  . Anxiety 05/15/2016  . Acute respiratory failure with hypoxia ([place]) 05/15/2016    Orientation RESPIRATION BLADDER Height & Weight        Normal Incontinent Weight: 167 lb (75.8 kg) Height:  5\' 1"  (154.9 cm)  BEHAVIORAL SYMPTOMS/MOOD NEUROLOGICAL BOWEL NUTRITION STATUS      Incontinent Diet(carb modified)  AMBULATORY STATUS COMMUNICATION OF NEEDS Skin   Limited Assist Verbally Normal                       Personal Care Assistance Level of Assistance  Bathing, Feeding, Dressing Bathing Assistance: Limited assistance Feeding assistance: Independent Dressing Assistance: Limited assistance     Functional Limitations Info  Sight, Hearing, Speech Sight Info: Adequate Hearing Info: Adequate Speech Info: Adequate    SPECIAL CARE FACTORS FREQUENCY                       Contractures Contractures Info: Not present    Additional Factors Info  Code Status, Allergies, Psychotropic(09/15/19 COVID test NEGATIVE. Patient previously COVID + on 07/21/19.) Code Status Info: DNR Allergies Info: Ace Inhibitors, Glipizide Neosporin, Spironolactone, Diovan, Keflax Psychotropic Info: Xanax         Current Medications (09/15/2019):  This is the current hospital active medication list Current Facility-Administered Medications  Medication Dose Route Frequency Provider Last Rate Last Admin  . acetaminophen (TYLENOL) tablet 650 mg  650 mg Oral Q6H PRN Oswald Hillock, MD   650 mg at 09/15/19 [id number]   Or  . acetaminophen (TYLENOL) suppository 650 mg  650 mg Rectal Q6H PRN Oswald Hillock, MD      . ALPRAZolam Duanne Moron) tablet 0.5 mg  0.5 mg Oral TID PRN Johnson, Clanford L, MD      . amLODipine (NORVASC) tablet 10 mg  10 mg Oral q morning - 10a Darrick Meigs, Marge Duncans, MD      .  carvedilol (COREG) tablet 6.25 mg  6.25 mg Oral BID WC Darrick Meigs, Gagan S, MD      . docusate sodium (COLACE) capsule 100 mg  100 mg Oral BID Oswald Hillock, MD   100 mg at 09/15/19 0310  . enoxaparin (LOVENOX) injection 40 mg  40 mg Subcutaneous Daily Darrick Meigs, Gagan S, MD   40 mg at 09/15/19 1110  . furosemide (LASIX) tablet 40 mg  40 mg Oral q morning - 10a Lama, Marge Duncans, MD      . haloperidol lactate (HALDOL) injection 2 mg  2 mg Intramuscular Q6H PRN Wynetta Emery, Clanford L, MD   2 mg at 09/15/19 1508  . insulin  aspart (novoLOG) injection 0-9 Units  0-9 Units Subcutaneous TID WC Darrick Meigs, Marge Duncans, MD      . insulin glargine (LANTUS) injection 25 Units  25 Units Subcutaneous QHS Johnson, Clanford L, MD      . Derrill Memo ON 09/16/2019] levofloxacin (LEVAQUIN) IVPB 750 mg  750 mg Intravenous Q48H Amend, Sanda Klein, RPH      . ondansetron (ZOFRAN) tablet 4 mg  4 mg Oral Q6H PRN Oswald Hillock, MD       Or  . ondansetron (ZOFRAN) injection 4 mg  4 mg Intravenous Q6H PRN Oswald Hillock, MD      . potassium chloride (KLOR-CON) CR tablet 10 mEq  10 mEq Oral Daily Darrick Meigs, Marge Duncans, MD      . prednisoLONE acetate (PRED FORTE) 1 % ophthalmic suspension 1 drop  1 drop Both Eyes QID Oswald Hillock, MD   1 drop at 09/15/19 1112     Discharge Medications: Please see discharge summary for a list of discharge medications.  Relevant Imaging Results:  Relevant Lab Results:   Additional Information SSN [id number]  Ihor Gully, LCSW

## 2019-09-16 ENCOUNTER — Inpatient Hospital Stay (HOSPITAL_COMMUNITY): Payer: Medicare Other

## 2019-09-16 LAB — CBC
HCT: 40.8 % (ref 36.0–46.0)
Hemoglobin: 12.8 g/dL (ref 12.0–15.0)
MCH: 28.5 pg (ref 26.0–34.0)
MCHC: 31.4 g/dL (ref 30.0–36.0)
MCV: 90.9 fL (ref 80.0–100.0)
Platelets: 286 10*3/uL (ref 150–400)
RBC: 4.49 MIL/uL (ref 3.87–5.11)
RDW: 15 % (ref 11.5–15.5)
WBC: 8.1 10*3/uL (ref 4.0–10.5)
nRBC: 0 % (ref 0.0–0.2)

## 2019-09-16 LAB — BASIC METABOLIC PANEL
Anion gap: 9 (ref 5–15)
BUN: 14 mg/dL (ref 8–23)
CO2: 26 mmol/L (ref 22–32)
Calcium: 8.2 mg/dL — ABNORMAL LOW (ref 8.9–10.3)
Chloride: 106 mmol/L (ref 98–111)
Creatinine, Ser: 0.8 mg/dL (ref 0.44–1.00)
GFR calc Af Amer: >60 mL/min (ref >60)
GFR calc non Af Amer: >60 mL/min (ref >60)
Glucose, Bld: 94 mg/dL (ref 70–99)
Potassium: 3.6 mmol/L (ref 3.5–5.1)
Sodium: 141 mmol/L (ref 135–145)

## 2019-09-16 LAB — MAGNESIUM: Magnesium: 1.7 mg/dL (ref 1.7–2.4)

## 2019-09-16 LAB — GLUCOSE, CAPILLARY
Glucose-Capillary: 82 mg/dL (ref 70–99)
Glucose-Capillary: 160 mg/dL — ABNORMAL HIGH (ref 70–99)
Glucose-Capillary: 175 mg/dL — ABNORMAL HIGH (ref 70–99)
Glucose-Capillary: 180 mg/dL — ABNORMAL HIGH (ref 70–99)

## 2019-09-16 MED ORDER — QUETIAPINE FUMARATE 25 MG PO TABS
25.0000 mg | ORAL_TABLET | Freq: Every evening | ORAL | Status: DC | PRN
  Administered 2019-09-17: 25 mg via ORAL
  Filled 2019-09-16: qty 1

## 2019-09-16 NOTE — Evaluation (Signed)
Physical Therapy Evaluation Patient Details Name: DASHANIQUE ADDAIR MRN: [id number] DOB: 1931-04-19 Today's Date: 09/16/2019   History of Present Illness  Gayann Vecellio  is a 86 y.o. female, with history of diastolic CHF, diabetes mellitus type 2, hypertension who was brought to the hospital by patient's granddaughter with complaints of increasing weakness and falls.  Today she fell in the parking lot when she was trying to walk into the physician's office.  Her granddaughter and other family members tried to catch her, she fell backwards hitting her head on the pavement.  There was no loss of consciousness.  Patient was diagnosed with COVID-19 infection in January , at that time she spent some time in rehab due to deconditioning and has been at home for past month.  But she continued to have persistent weakness.  As per granddaughter patient was having extreme difficulty getting up from seated position even for transfers to wheelchair.  Patient has been more confused than usual.  She was seen in the ED 3 days ago at that time she was diagnosed with UTI and was sent home on Keflex.  Patient developed pruritic rash on lower extremities.  Granddaughter stopped the Keflex suspecting allergic reaction to antibiotic.    Clinical Impression  Patient very unsteady on feet and at severe risk for falls, has most difficulty with sit to stands due to limited bilateral knee flexion and tends to lean backwards when standing requiring tactile cueing to avoid falling backwards.  Patient limited to a few side steps and tolerated sitting up in chair after therapy - nursing staff aware.  Patient will benefit from continued physical therapy in hospital and recommended venue below to increase strength, balance, endurance for safe ADLs and gait.    Follow Up Recommendations SNF    Equipment Recommendations  None recommended by PT    Recommendations for Other Services       Precautions / Restrictions  Precautions Precautions: Fall Restrictions Weight Bearing Restrictions: No      Mobility  Bed Mobility Overal bed mobility: Needs Assistance Bed Mobility: Supine to Sit     Supine to sit: Mod assist     General bed mobility comments: slow labored movement  Transfers Overall transfer level: Needs assistance Equipment used: Rolling walker (2 wheeled) Transfers: Sit to/from [facility] Sit to Stand: Mod assist Stand pivot transfers: Mod assist;Max assist       General transfer comment: diffiuclty with sit to stands due to limited bilateral knee flexion  Ambulation/Gait Ambulation/Gait assistance: Max assist Gait Distance (Feet): 4 Feet Assistive device: Rolling walker (2 wheeled) Gait Pattern/deviations: Decreased step length - left;Decreased stride length;Decreased step length - right Gait velocity: decreased   General Gait Details: limited to 4-5 slow unsteady labored steps, leans backwards and requires tactile cueing to avoid falling backwards  Stairs            Wheelchair Mobility    Modified Rankin (Stroke Patients Only)       Balance Overall balance assessment: Needs assistance Sitting-balance support: Feet supported;No upper extremity supported Sitting balance-Leahy Scale: Fair Sitting balance - Comments: seated at EOB   Standing balance support: During functional activity;Bilateral upper extremity supported Standing balance-Leahy Scale: Poor Standing balance comment: using RW                             Pertinent Vitals/Pain Pain Assessment: No/denies pain    Home Living Family/patient expects to be discharged  to:: Private residence Living Arrangements: Other relatives Available Help at Discharge: Family;Available PRN/intermittently Type of Home: House Home Access: Stairs to enter Entrance Stairs-Rails: Right Entrance Stairs-Number of Steps: 5 Home Layout: Two level;Able to live on main level with  bedroom/bathroom Home Equipment: Gilford Rile - 2 wheels;Cane - single point;Shower seat      Prior Function Level of Independence: Needs assistance   Gait / Transfers Assistance Needed: household ambulator with RW  ADL's / Homemaking Assistance Needed: assist of family  Comments: Patient is a poor historian, information taken from a previous admission     Hand Dominance        Extremity/Trunk Assessment   Upper Extremity Assessment Upper Extremity Assessment: Generalized weakness         Cervical / Trunk Assessment Cervical / Trunk Assessment: Kyphotic  Communication   Communication: HOH  Cognition Arousal/Alertness: Awake/alert Behavior During Therapy: WFL for tasks assessed/performed Overall Cognitive Status: No family/caregiver present to determine baseline cognitive functioning                                        General Comments      Exercises     Assessment/Plan    PT Assessment Patient needs continued PT services  PT Problem List Decreased strength;Decreased activity tolerance;Decreased balance;Decreased mobility       PT Treatment Interventions Gait training;Functional mobility training;Stair training;Therapeutic activities;Therapeutic exercise;Patient/family education    PT Goals (Current goals can be found in the Care Plan section)  Acute Rehab PT Goals Patient Stated Goal: return home PT Goal Formulation: With patient Time For Goal Achievement: 09/30/19 Potential to Achieve Goals: Good    Frequency     Barriers to discharge        Co-evaluation               AM-PAC PT "6 Clicks" Mobility  Outcome Measure Help needed turning from your back to your side while in a flat bed without using bedrails?: A Lot Help needed moving from lying on your back to sitting on the side of a flat bed without using bedrails?: A Lot Help needed moving to and from a bed to a chair (including a wheelchair)?: A Lot Help needed standing up  from a chair using your arms (e.g., wheelchair or bedside chair)?: A Lot Help needed to walk in hospital room?: A Lot Help needed climbing 3-5 steps with a railing? : Total 6 Click Score: 11    End of Session   Activity Tolerance: Patient tolerated treatment well;Patient limited by fatigue Patient left: in chair;with call bell/phone within reach;with chair alarm set Nurse Communication: Mobility status PT Visit Diagnosis: Unsteadiness on feet (R26.81);Other abnormalities of gait and mobility (R26.89);Muscle weakness (generalized) (M62.81)    Time: [id number] PT Time Calculation (min) (ACUTE ONLY): 28 min   Charges:   PT Evaluation $PT Eval Moderate Complexity: 1 Mod PT Treatments $Therapeutic Activity: 23-37 mins        12:28 PM, 09/16/19 Lonell Grandchild, MPT Physical Therapist with [facility] [phone] office [phone] mobile phone

## 2019-09-16 NOTE — Care Management Important Message (Signed)
Important Message  Patient Details  Name: [name] MRN: [id number] Date of Birth: [birth year]   Medicare Important Message Given:  Yes(spoke with daughter, copy mailed to address on file)     Tommy Medal 09/16/2019, 4:41 PM

## 2019-09-16 NOTE — Plan of Care (Signed)
  Problem: Acute Rehab PT Goals(only PT should resolve) Goal: Pt Will Go Supine/Side To Sit Outcome: Progressing Flowsheets (Taken 09/16/2019 1229) Pt will go Supine/Side to Sit: with minimal assist Goal: Patient Will Transfer Sit To/From Stand Outcome: Progressing Flowsheets (Taken 09/16/2019 1229) Patient will transfer sit to/from stand: with minimal assist Goal: Pt Will Transfer Bed To Chair/Chair To Bed Outcome: Progressing Flowsheets (Taken 09/16/2019 1229) Pt will Transfer Bed to Chair/Chair to Bed: with min assist Goal: Pt Will Ambulate Outcome: Progressing Flowsheets (Taken 09/16/2019 1229) Pt will Ambulate:  25 feet  with minimal assist  with moderate assist  with rolling walker   12:30 PM, 09/16/19 Lonell Grandchild, MPT Physical Therapist with [facility] [phone] office [phone] mobile phone

## 2019-09-16 NOTE — Progress Notes (Signed)
PROGRESS NOTE [facility]   [name]  [id number]  DOB: 05-19-1931  DOA: 09/14/2019 PCP: [name], MD   Brief Admission Hx: 86 y.o. female, with history of diastolic CHF, diabetes mellitus type 2, hypertension who was brought to the hospital by patient's granddaughter with complaints of increasing weakness and falls.  MDM/Assessment & Plan:   1. Frequent falls at home-patient high risk for fracture given frequent falls.  She has been seen by PT and they are recommending SNF placement.  Social worker is working on placement with the family. 2. E. coli UTI-treated with IV Levaquin. 3. Community acquired pneumonia-patient is being treated with IV Levaquin and is clinically responding to treatment and feeling better.  Continue supportive care. 4. Type 2 diabetes mellitus-blood sugars fairly well controlled on current regimen continue to follow closely. 5. Acute on chronic diastolic CHF-patient has been diuresing with Lasix and we have resumed home oral Lasix. 6. Essential hypertension-blood pressures well controlled continue to monitor. 7. Dementia-resume home medications and delirium precautions. 8. Right hip pain - with her frequent falls, we ordered an xray to rule out acute fracture.   DVT prophylaxis: Lovenox Code Status: DNR Family Communication: Attempted to reach family unable to reach x3 Disposition Plan: From home but due to frequent falls and general debility will require SNF rehabilitation prior to going home.   Consultants:    Procedures:    Antimicrobials:  Levofloxacin  Subjective: Patient less confused today.  She is eating a little better.    Objective: Vitals:   09/15/19 2300 09/16/19 0627 09/16/19 0826 09/16/19 1351  BP: (!) 146/73 (!) 148/70  (!) 143/55  Pulse: 77 80  81  Resp: 15 17  18   Temp: 97.6 F (36.4 C) 98.7 F (37.1 C)  98.5 F (36.9 C)  TempSrc: Axillary Oral    SpO2: 99% 92% 94% 95%  Weight:      Height:         Intake/Output Summary (Last 24 hours) at 09/16/2019 1836 Last data filed at 09/16/2019 1305 Gross per 24 hour  Intake 740 ml  Output --  Net 740 ml   Filed Weights   09/14/19 1546  Weight: 75.8 kg   REVIEW OF SYSTEMS  UTO due to dementia  Exam:  General exam: elderly female with dementia, awake, alert, NAD.  Respiratory system:  No increased work of breathing. Cardiovascular system: S1 & S2 heard. No JVD, murmurs, gallops, clicks or pedal edema. Gastrointestinal system: Abdomen is nondistended, soft and nontender. Normal bowel sounds heard. Central nervous system: Alert and with dementia. No focal neurological deficits. Extremities: diffuse osteoarthritic changes.   Data Reviewed: Basic Metabolic Panel: Recent Labs  Lab 09/11/19 1134 09/14/19 2021 09/15/19 0438 09/16/19 0447  NA 141 139 139 141  K 3.4* 3.5 3.4* 3.6  CL 105 106 104 106  CO2 28 23 26 26   GLUCOSE 170* 172* 121* 94  BUN 14 19 18 14   CREATININE 0.92 1.08* 0.97 0.80  CALCIUM 8.5* 8.0* 8.0* 8.2*  MG  --   --   --  1.7   Liver Function Tests: Recent Labs  Lab 09/11/19 1134 09/14/19 2021 09/15/19 0438  AST 18 21 24   ALT 13 13 14   ALKPHOS 66 60 54  BILITOT 0.7 0.8 0.7  PROT 6.8 6.7 6.4*  ALBUMIN 2.9* 2.8* 2.6*   No results for input(s): LIPASE, AMYLASE in the last 168 hours. No results for input(s): AMMONIA in the last 168 hours. CBC:  Recent Labs  Lab 09/11/19 1134 09/14/19 2021 09/15/19 0438 09/16/19 0447  WBC 8.3 11.4* 11.5* 8.1  NEUTROABS 5.1 8.9*  --   --   HGB 12.4 13.4 12.3 12.8  HCT 38.3 42.6 38.8 40.8  MCV 91.0 91.6 90.9 90.9  PLT 305 330 302 286   Cardiac Enzymes: No results for input(s): CKTOTAL, CKMB, CKMBINDEX, TROPONINI in the last 168 hours. CBG (last 3)  Recent Labs    09/16/19 0444 09/16/19 1148 09/16/19 1655  GLUCAP 82 160* 175*   Recent Results (from the past 240 hour(s))  Urine Culture     Status: Abnormal   Collection Time: 09/11/19  2:14 PM   Specimen:  Urine, Clean Catch  Result Value Ref Range Status   Specimen Description   Final    URINE, CLEAN CATCH Performed at [facility], [address] [place], [place] [address]    Special Requests   Final    NONE Performed at [facility], [address] [place], [place] [address]    Culture >=100,000 COLONIES/mL ESCHERICHIA COLI (A)  Final   Report Status 09/14/2019 FINAL  Final   Organism ID, Bacteria ESCHERICHIA COLI (A)  Final      Susceptibility   Escherichia coli - MIC*    AMPICILLIN <=2 SENSITIVE Sensitive     CEFAZOLIN <=4 SENSITIVE Sensitive     CEFTRIAXONE <=0.25 SENSITIVE Sensitive     CIPROFLOXACIN <=0.25 SENSITIVE Sensitive     GENTAMICIN <=1 SENSITIVE Sensitive     IMIPENEM <=0.25 SENSITIVE Sensitive     NITROFURANTOIN <=16 SENSITIVE Sensitive     TRIMETH/SULFA <=20 SENSITIVE Sensitive     AMPICILLIN/SULBACTAM <=2 SENSITIVE Sensitive     PIP/TAZO <=4 SENSITIVE Sensitive     * >=100,000 COLONIES/mL ESCHERICHIA COLI  Blood culture (routine x 2)     Status: None (Preliminary result)   Collection Time: 09/14/19  9:35 PM   Specimen: BLOOD LEFT HAND  Result Value Ref Range Status   Specimen Description BLOOD LEFT HAND  Final   Special Requests   Final    BOTTLES DRAWN AEROBIC AND ANAEROBIC Blood Culture adequate volume   Culture   Final    NO GROWTH 2 DAYS Performed at [facility], [address] [place], [place] [address]    Report Status PENDING  Incomplete  Blood culture (routine x 2)     Status: None (Preliminary result)   Collection Time: 09/14/19  9:45 PM   Specimen: BLOOD RIGHT HAND  Result Value Ref Range Status   Specimen Description BLOOD RIGHT HAND  Final   Special Requests   Final    BOTTLES DRAWN AEROBIC AND ANAEROBIC Blood Culture adequate volume   Culture   Final    NO GROWTH 2 DAYS Performed at [facility], [address], [place], [place] [address]    Report Status PENDING  Incomplete  SARS CORONAVIRUS 2 (TAT 6-24 HRS) Nasopharyngeal  Nasopharyngeal Swab     Status: None   Collection Time: 09/15/19 12:08 AM   Specimen: Nasopharyngeal Swab  Result Value Ref Range Status   SARS Coronavirus 2 NEGATIVE NEGATIVE Final    Comment: (NOTE) SARS-CoV-2 target nucleic acids are NOT DETECTED. The SARS-CoV-2 RNA is generally detectable in upper and lower respiratory specimens during the acute phase of infection. Negative results do not preclude SARS-CoV-2 infection, do not rule out co-infections with other pathogens, and should not be used as the sole basis for treatment or other patient management decisions. Negative results must be  combined with clinical observations, patient history, and epidemiological information. The expected result is Negative. Fact Sheet for Patients: SugarRoll.be Fact Sheet for Healthcare Providers: https://www.woods-mathews.com/ This test is not yet approved or cleared by the [place] FDA and  has been authorized for detection and/or diagnosis of SARS-CoV-2 by FDA under an Emergency Use Authorization (EUA). This EUA will remain  in effect (meaning this test can be used) for the duration of the COVID-19 declaration under Section 56 4(b)(1) of the Act, 21 U.S.C. section 360bbb-3(b)(1), unless the authorization is terminated or revoked sooner. Performed at [facility], [place] [address], [place], [place] [address]      Studies: DG Chest 2 View  Result Date: 09/14/2019 CLINICAL DATA:  Shortness of breath, edema EXAM: CHEST - 2 VIEW COMPARISON:  09/11/2019 FINDINGS: Stable elevation of the right hemidiaphragm. Pulmonary vascular congestion. Patchy density at the left lung base. No pleural effusion. No pneumothorax. Stable cardiomediastinal contours. IMPRESSION: Pulmonary vascular congestion and mild patchy atelectasis/consolidation at the left lung base. Electronically Signed   By: Macy Mis M.D.   On: 09/14/2019 19:04   DG HIP UNILAT WITH PELVIS 2-3  VIEWS RIGHT  Result Date: 09/16/2019 CLINICAL DATA:  Confusion and multiple recent falls. Right hip and buttock pain. EXAM: DG HIP (WITH OR WITHOUT PELVIS) 2-3V RIGHT COMPARISON:  None. FINDINGS: Prominent soft tissue edema in the bilateral hip fat planes, probably anasarca. Numerous surgical clips in the pelvis. Chronic-appearing cortical avulsion injuries of the right femur lesser trochanter and left femur greater trochanter. Moderate degenerative changes. No acute fracture lucency or dislocation of the hips or bony pelvis. Intact first and second sacral neural arches. IMPRESSION: 1. No acute bony abnormality. 2. Chronic-appearing cortical avulsion injuries of the right femur lesser trochanter and left femur greater trochanter. 3. Moderate skeletal degenerative change. 4. Anasarca. Electronically Signed   By: Revonda Humphrey   On: 09/16/2019 16:38   Scheduled Meds: . amLODipine  10 mg Oral q morning - 10a  . carvedilol  6.25 mg Oral BID WC  . docusate sodium  100 mg Oral BID  . enoxaparin (LOVENOX) injection  40 mg Subcutaneous Daily  . furosemide  40 mg Oral q morning - 10a  . insulin aspart  0-9 Units Subcutaneous TID WC  . insulin glargine  25 Units Subcutaneous QHS  . potassium chloride  10 mEq Oral Daily  . prednisoLONE acetate  1 drop Both Eyes QID   Continuous Infusions: . levofloxacin (LEVAQUIN) IV      Active Problems:   UTI (urinary tract infection)   Community acquired pneumonia of left lower lobe of lung   Time spent:   Irwin Brakeman, MD [facility] 09/16/2019, 6:36 PM    LOS: 2 days  How to contact the [facility] Attending or Consulting provider [place] Vera or covering provider during after hours [place], for this patient?  1. Check the care team in [facility] and look for a) attending/consulting TRH provider listed and b) the [facility] team listed 2. Log into [email or web address] and use [place]'s universal password to access. If you do not have the password, please contact the hospital  operator. 3. Locate the [facility]: [place] Rhonda W provider you are looking for under [facility] and page to a number that you can be directly reached. 4. If you still have difficulty reaching the provider, please page the [facility] (Director on Call) for the Hospitalists listed on amion for assistance.

## 2019-09-16 NOTE — TOC Progression Note (Addendum)
Transition of Care [facility]) - Progression Note    Patient Details  Name: [name] MRN: [id number] Date of Birth: [birth year]  [facility] CM/SW Contact  Ihor Gully, LCSW Phone Number: 09/16/2019, 3:56 PM  Clinical Narrative:    Granddaughter, [name], indicates that [facility] and [facility] are first choices. Messages left for admissions with both facilities regarding.   [name] at [facility] returned contact. Copays are $185.00/day. Patient will be billed for copays. [name] informed of this information. Can take patient on Monday.         Expected Discharge Plan and Services                                                 Social Determinants of Health (SDOH) Interventions    Readmission Risk Interventions No flowsheet data found.

## 2019-09-16 NOTE — NC FL2 (Signed)
[place] LEVEL OF CARE SCREENING TOOL     IDENTIFICATION  Patient Name: [name] Birthdate: 12/18/1930 Sex: female Admission Date (Current Location): 09/14/2019  [facility] and [place] Number:  [employer] and Address:  [place] [address] [place]      Provider Number: [id number]  Attending Physician Name and Address:  Murlean Iba, MD  Relative Name and Phone Number:       Current Level of Care: Hospital Recommended Level of Care: [place] Prior Approval Number:    Date Approved/Denied:   PASRR Number: [id number]  Discharge Plan: SNF    Current Diagnoses: Patient Active Problem List   Diagnosis Date Noted  . Community acquired pneumonia of left lower lobe of lung   . UTI (urinary tract infection) 09/14/2019  . Pneumonia due to COVID-19 virus 07/22/2019  . Acute on chronic diastolic CHF (congestive heart failure) ([place]) 05/06/2017  . Altered mental status   . Acute respiratory failure ([place]) 05/04/2017  . Hypoglycemia 04/30/2017  . Acute urinary retention 02/23/2017  . Chronic diastolic CHF (congestive heart failure) ([place])   . SBO (small bowel obstruction) ([place]) 02/22/2017  . Hypertensive urgency 02/22/2017  . Leukocytosis 02/22/2017  . Hyponatremia 02/22/2017  . Dehydration 02/22/2017  . Dizziness 09/01/2016  . Weakness 09/01/2016  . Psoriasis vulgaris 09/01/2016  . Right hip pain 09/01/2016  . Diabetic foot ulcer ([place]) 09/01/2016  . Dyspnea 05/15/2016  . Congestive heart failure (CHF) ([place]) 05/15/2016  . Rash and nonspecific skin eruption 05/15/2016  . Essential hypertension 05/15/2016  . Uncontrolled type 2 diabetes mellitus with hyperglycemia ([place]) 05/15/2016  . Anxiety 05/15/2016  . Acute respiratory failure with hypoxia ([place]) 05/15/2016    Orientation RESPIRATION BLADDER Height & Weight        Normal Incontinent Weight: 167 lb (75.8 kg) Height:  5\' 1"  (154.9 cm)   BEHAVIORAL SYMPTOMS/MOOD NEUROLOGICAL BOWEL NUTRITION STATUS      Incontinent Diet(carb modified)  AMBULATORY STATUS COMMUNICATION OF NEEDS Skin   Limited Assist Verbally Normal                       Personal Care Assistance Level of Assistance  Bathing, Feeding, Dressing Bathing Assistance: Limited assistance Feeding assistance: Independent Dressing Assistance: Limited assistance     Functional Limitations Info  Sight, Hearing, Speech Sight Info: Adequate Hearing Info: Adequate Speech Info: Adequate    SPECIAL CARE FACTORS FREQUENCY  PT (By licensed PT)     PT Frequency: 5x/weel              Contractures Contractures Info: Not present    Additional Factors Info  Code Status, Allergies, Psychotropic(09/15/19 COVID test NEGATIVE. Patient previously COVID + on 07/21/19.) Code Status Info: DNR Allergies Info: Ace Inhibitors, Glipizide Neosporin, Spironolactone, Diovan, Keflax Psychotropic Info: Xanax         Current Medications (09/16/2019):  This is the current hospital active medication list Current Facility-Administered Medications  Medication Dose Route Frequency Provider Last Rate Last Admin  . acetaminophen (TYLENOL) tablet 650 mg  650 mg Oral Q6H PRN Oswald Hillock, MD   650 mg at 09/15/19 [id number]   Or  . acetaminophen (TYLENOL) suppository 650 mg  650 mg Rectal Q6H PRN Oswald Hillock, MD      . ALPRAZolam Duanne Moron) tablet 0.5 mg  0.5 mg Oral TID PRN Johnson, Clanford L, MD      . amLODipine (NORVASC) tablet 10  mg  10 mg Oral q morning - 10a Oswald Hillock, MD   10 mg at 09/16/19 [id number]  . carvedilol (COREG) tablet 6.25 mg  6.25 mg Oral BID WC Oswald Hillock, MD   6.25 mg at 09/16/19 0904  . docusate sodium (COLACE) capsule 100 mg  100 mg Oral BID Oswald Hillock, MD   100 mg at 09/16/19 0904  . enoxaparin (LOVENOX) injection 40 mg  40 mg Subcutaneous Daily Oswald Hillock, MD   40 mg at 09/16/19 0906  . furosemide (LASIX) tablet 40 mg  40 mg Oral q morning - 10a Oswald Hillock, MD   40 mg at 09/16/19 0904  . haloperidol lactate (HALDOL) injection 2 mg  2 mg Intramuscular Q6H PRN Wynetta Emery, Clanford L, MD   2 mg at 09/15/19 1508  . insulin aspart (novoLOG) injection 0-9 Units  0-9 Units Subcutaneous TID WC Oswald Hillock, MD   2 Units at 09/16/19 1203  . insulin glargine (LANTUS) injection 25 Units  25 Units Subcutaneous QHS Johnson, Clanford L, MD      . levofloxacin (LEVAQUIN) IVPB 750 mg  750 mg Intravenous Q48H Franky Macho, RPH      . ondansetron (ZOFRAN) tablet 4 mg  4 mg Oral Q6H PRN Oswald Hillock, MD       Or  . ondansetron (ZOFRAN) injection 4 mg  4 mg Intravenous Q6H PRN Oswald Hillock, MD      . potassium chloride (KLOR-CON) CR tablet 10 mEq  10 mEq Oral Daily Darrick Meigs, Marge Duncans, MD      . prednisoLONE acetate (PRED FORTE) 1 % ophthalmic suspension 1 drop  1 drop Both Eyes QID Oswald Hillock, MD   1 drop at 09/15/19 2312     Discharge Medications: Please see discharge summary for a list of discharge medications.  Relevant Imaging Results:  Relevant Lab Results:   Additional Information SSN [id number]  Ihor Gully, LCSW

## 2019-09-16 NOTE — TOC Progression Note (Signed)
Transition of Care [facility]) - Progression Note    Patient Details  Name: [name] MRN: [id number] Date of Birth: 1931/05/25  [facility] CM/SW Contact  Ihor Gully, LCSW Phone Number: 09/16/2019, 3:17 PM  Clinical Narrative:    Authorization started. Message left for granddaughter, [name], requesting return contact to discuss bed offers.         Expected Discharge Plan and Services                                                 Social Determinants of Health (SDOH) Interventions    Readmission Risk Interventions No flowsheet data found.

## 2019-09-17 LAB — GLUCOSE, CAPILLARY
Glucose-Capillary: 121 mg/dL — ABNORMAL HIGH (ref 70–99)
Glucose-Capillary: 126 mg/dL — ABNORMAL HIGH (ref 70–99)
Glucose-Capillary: 185 mg/dL — ABNORMAL HIGH (ref 70–99)
Glucose-Capillary: 146 mg/dL — ABNORMAL HIGH (ref 70–99)
Glucose-Capillary: 263 mg/dL — ABNORMAL HIGH (ref 70–99)

## 2019-09-17 MED ORDER — ACETAMINOPHEN 325 MG PO TABS
650.0000 mg | ORAL_TABLET | Freq: Four times a day (QID) | ORAL | Status: DC
  Administered 2019-09-17 – 2019-09-19 (×6): 650 mg via ORAL
  Filled 2019-09-17 (×7): qty 2

## 2019-09-17 MED ORDER — ACETAMINOPHEN 650 MG RE SUPP: 650.0000 mg | Freq: Four times a day (QID) | RECTAL | Status: DC

## 2019-09-17 NOTE — Progress Notes (Signed)
PROGRESS NOTE [name]  [name]  [id number]  DOB: 04-27-1931  DOA: 09/14/2019 PCP: [name], MD   Brief Admission Hx: 86 y.o. female, with history of diastolic CHF, diabetes mellitus type 2, hypertension who was brought to the hospital by patient's granddaughter with complaints of increasing weakness and falls.  MDM/Assessment & Plan:   1. Frequent falls at home-patient high risk for fracture given frequent falls.  She has been seen by PT and they are recommending SNF placement.   Social worker is working on placement with the family. 2. E. coli UTI-treated with IV Levaquin.  Levaquin discontinued 09/17/2019. 3. Community acquired pneumonia-patient is being treated with IV Levaquin and is clinically responding to treatment and feeling better.  Continue supportive care.  Treated with Levaquin.  Levaquin discontinued 09/17/2019. 4. Type 2 diabetes mellitus-blood sugars fairly well controlled on current regimen continue to follow closely. 5. Acute on chronic diastolic CHF-patient has been diuresing with Lasix and we have resumed home oral Lasix. 6. Essential hypertension-blood pressures well controlled continue to monitor. 7. Dementia-resume home medications and delirium precautions. 8. Right hip pain -x-rays negative for acute fracture.  Continue PT and rehab.  Will schedule Tylenol for pain symptoms.  DVT prophylaxis: Lovenox Code Status: DNR Family Communication: Attempted to reach grandaughter and grandson x 2 with no answer to either number Disposition Plan: From home but due to frequent falls and general debility will require SNF rehabilitation prior to going home.   Consultants:    Procedures:    Antimicrobials:  Levofloxacin discontinued 09/17/19  Subjective: Patient eating and drinking today.     Objective: Vitals:   09/16/19 2216 09/17/19 0442 09/17/19 0803 09/17/19 0900  BP: (!) 158/50 (!) 169/66    Pulse: 77 80    Resp: 17     Temp: 99.6 F  (37.6 C) 98.7 F (37.1 C)    TempSrc: Oral Oral    SpO2: 94% 95% 92% 92%  Weight:      Height:        Intake/Output Summary (Last 24 hours) at 09/17/2019 1203 Last data filed at 09/17/2019 0900 Gross per 24 hour  Intake 686.55 ml  Output --  Net 686.55 ml   Filed Weights   09/14/19 1546  Weight: 75.8 kg   REVIEW OF SYSTEMS  UTO due to dementia  Exam:  General exam: elderly female with dementia, awake, alert, NAD.  Respiratory system:  No increased work of breathing. Cardiovascular system: S1 & S2 heard. No JVD, murmurs, gallops, clicks or pedal edema. Gastrointestinal system: Abdomen is nondistended, soft and nontender. Normal bowel sounds heard. Central nervous system: Alert and with dementia. No focal neurological deficits. Extremities: diffuse osteoarthritic changes.   Data Reviewed: Basic Metabolic Panel: Recent Labs  Lab 09/11/19 1134 09/14/19 2021 09/15/19 0438 09/16/19 0447  NA 141 139 139 141  K 3.4* 3.5 3.4* 3.6  CL 105 106 104 106  CO2 28 23 26 26   GLUCOSE 170* 172* 121* 94  BUN 14 19 18 14   CREATININE 0.92 1.08* 0.97 0.80  CALCIUM 8.5* 8.0* 8.0* 8.2*  MG  --   --   --  1.7   Liver Function Tests: Recent Labs  Lab 09/11/19 1134 09/14/19 2021 09/15/19 0438  AST 18 21 24   ALT 13 13 14   ALKPHOS 66 60 54  BILITOT 0.7 0.8 0.7  PROT 6.8 6.7 6.4*  ALBUMIN 2.9* 2.8* 2.6*   No results for input(s): LIPASE, AMYLASE in the last  168 hours. No results for input(s): AMMONIA in the last 168 hours. CBC: Recent Labs  Lab 09/11/19 1134 09/14/19 2021 09/15/19 0438 09/16/19 0447  WBC 8.3 11.4* 11.5* 8.1  NEUTROABS 5.1 8.9*  --   --   HGB 12.4 13.4 12.3 12.8  HCT 38.3 42.6 38.8 40.8  MCV 91.0 91.6 90.9 90.9  PLT 305 330 302 286   Cardiac Enzymes: No results for input(s): CKTOTAL, CKMB, CKMBINDEX, TROPONINI in the last 168 hours. CBG (last 3)  Recent Labs    09/17/19 0342 09/17/19 0715 09/17/19 1117  GLUCAP 121* 126* 185*   Recent Results  (from the past 240 hour(s))  Urine Culture     Status: Abnormal   Collection Time: 09/11/19  2:14 PM   Specimen: Urine, Clean Catch  Result Value Ref Range Status   Specimen Description   Final    URINE, CLEAN CATCH Performed at [facility], [address], [place], [place] [address]    Special Requests   Final    NONE Performed at [facility], [address], [place], [place] [address]    Culture >=100,000 COLONIES/mL ESCHERICHIA COLI (A)  Final   Report Status 09/14/2019 FINAL  Final   Organism ID, Bacteria ESCHERICHIA COLI (A)  Final      Susceptibility   Escherichia coli - MIC*    AMPICILLIN <=2 SENSITIVE Sensitive     CEFAZOLIN <=4 SENSITIVE Sensitive     CEFTRIAXONE <=0.25 SENSITIVE Sensitive     CIPROFLOXACIN <=0.25 SENSITIVE Sensitive     GENTAMICIN <=1 SENSITIVE Sensitive     IMIPENEM <=0.25 SENSITIVE Sensitive     NITROFURANTOIN <=16 SENSITIVE Sensitive     TRIMETH/SULFA <=20 SENSITIVE Sensitive     AMPICILLIN/SULBACTAM <=2 SENSITIVE Sensitive     PIP/TAZO <=4 SENSITIVE Sensitive     * >=100,000 COLONIES/mL ESCHERICHIA COLI  Blood culture (routine x 2)     Status: None (Preliminary result)   Collection Time: 09/14/19  9:35 PM   Specimen: BLOOD LEFT HAND  Result Value Ref Range Status   Specimen Description BLOOD LEFT HAND  Final   Special Requests   Final    BOTTLES DRAWN AEROBIC AND ANAEROBIC Blood Culture adequate volume   Culture   Final    NO GROWTH 3 DAYS Performed at [facility], [address], [place], [place] [address]    Report Status PENDING  Incomplete  Blood culture (routine x 2)     Status: None (Preliminary result)   Collection Time: 09/14/19  9:45 PM   Specimen: BLOOD RIGHT HAND  Result Value Ref Range Status   Specimen Description BLOOD RIGHT HAND  Final   Special Requests   Final    BOTTLES DRAWN AEROBIC AND ANAEROBIC Blood Culture adequate volume   Culture   Final    NO GROWTH 3 DAYS Performed at [facility], [address],  [place], [place] [address]    Report Status PENDING  Incomplete  SARS CORONAVIRUS 2 (TAT 6-24 HRS) Nasopharyngeal Nasopharyngeal Swab     Status: None   Collection Time: 09/15/19 12:08 AM   Specimen: Nasopharyngeal Swab  Result Value Ref Range Status   SARS Coronavirus 2 NEGATIVE NEGATIVE Final    Comment: (NOTE) SARS-CoV-2 target nucleic acids are NOT DETECTED. The SARS-CoV-2 RNA is generally detectable in upper and lower respiratory specimens during the acute phase of infection. Negative results do not preclude SARS-CoV-2 infection, do not rule out co-infections with other pathogens, and should not be used as the  sole basis for treatment or other patient management decisions. Negative results must be combined with clinical observations, patient history, and epidemiological information. The expected result is Negative. Fact Sheet for Patients: SugarRoll.be Fact Sheet for Healthcare Providers: https://www.woods-mathews.com/ This test is not yet approved or cleared by the [place] FDA and  has been authorized for detection and/or diagnosis of SARS-CoV-2 by FDA under an Emergency Use Authorization (EUA). This EUA will remain  in effect (meaning this test can be used) for the duration of the COVID-19 declaration under Section 56 4(b)(1) of the Act, 21 U.S.C. section 360bbb-3(b)(1), unless the authorization is terminated or revoked sooner. Performed at [facility], [place] [address] [place] [address] [place], [place] [address]      Studies: DG HIP UNILAT WITH PELVIS 2-3 VIEWS RIGHT  Result Date: 09/16/2019 CLINICAL DATA:  Confusion and multiple recent falls. Right hip and buttock pain. EXAM: DG HIP (WITH OR WITHOUT PELVIS) 2-3V RIGHT COMPARISON:  None. FINDINGS: Prominent soft tissue edema in the bilateral hip fat planes, probably anasarca. Numerous surgical clips in the pelvis. Chronic-appearing cortical avulsion injuries of the right femur lesser trochanter  and left femur greater trochanter. Moderate degenerative changes. No acute fracture lucency or dislocation of the hips or bony pelvis. Intact first and second sacral neural arches. IMPRESSION: 1. No acute bony abnormality. 2. Chronic-appearing cortical avulsion injuries of the right femur lesser trochanter and left femur greater trochanter. 3. Moderate skeletal degenerative change. 4. Anasarca. Electronically Signed   By: Revonda Humphrey   On: 09/16/2019 16:38   Scheduled Meds: . amLODipine  10 mg Oral q morning - 10a  . carvedilol  6.25 mg Oral BID WC  . docusate sodium  100 mg Oral BID  . enoxaparin (LOVENOX) injection  40 mg Subcutaneous Daily  . furosemide  40 mg Oral q morning - 10a  . insulin aspart  0-9 Units Subcutaneous TID WC  . insulin glargine  25 Units Subcutaneous QHS  . potassium chloride  10 mEq Oral Daily  . prednisoLONE acetate  1 drop Both Eyes QID   Continuous Infusions:   Active Problems:   UTI (urinary tract infection)   Community acquired pneumonia of left lower lobe of lung   Time spent:   Irwin Brakeman, MD [facility] 09/17/2019, 12:03 PM    LOS: 3 days  How to contact the [facility] Attending or Consulting provider [place] or covering provider during after hours [place], for this patient?  1. Check the care team in [facility] and look for a) attending/consulting TRH provider listed and b) the [facility] team listed 2. Log into [email or web address] and use [place]'s universal password to access. If you do not have the password, please contact the hospital operator. 3. Locate the [facility] provider you are looking for under [facility] and page to a number that you can be directly reached. 4. If you still have difficulty reaching the provider, please page the [facility] (Director on Call) for the Hospitalists listed on amion for assistance.

## 2019-09-17 NOTE — Progress Notes (Signed)
Patient agitated,combative climbing out of bed, verbally abusive. She is at great risk for falls. Haldol IM  Given. I will continue to monitor. Fall mat placed bed alarm has been initiated

## 2019-09-18 LAB — GLUCOSE, CAPILLARY
Glucose-Capillary: 210 mg/dL — ABNORMAL HIGH (ref 70–99)
Glucose-Capillary: 245 mg/dL — ABNORMAL HIGH (ref 70–99)
Glucose-Capillary: 182 mg/dL — ABNORMAL HIGH (ref 70–99)
Glucose-Capillary: 186 mg/dL — ABNORMAL HIGH (ref 70–99)

## 2019-09-18 MED ORDER — QUETIAPINE FUMARATE 25 MG PO TABS
25.0000 mg | ORAL_TABLET | Freq: Every day | ORAL | Status: DC
  Administered 2019-09-18: 25 mg via ORAL
  Filled 2019-09-18: qty 1

## 2019-09-18 NOTE — Plan of Care (Signed)
  Problem: Nutrition: Goal: Adequate nutrition will be maintained Outcome: Progressing   Problem: Education: Goal: Knowledge of General Education information will improve Description: Including pain rating scale, medication(s)/side effects and non-pharmacologic comfort measures Outcome: Not Progressing   Problem: Health Behavior/Discharge Planning: Goal: Ability to manage health-related needs will improve Outcome: Not Progressing   

## 2019-09-18 NOTE — Progress Notes (Signed)
PROGRESS NOTE [name]  [name]  [id number]  DOB: 05/05/1931  DOA: 09/14/2019 PCP: [name], MD   Brief Admission Hx: 86 y.o. female, with history of diastolic CHF, diabetes mellitus type 2, hypertension who was brought to the hospital by patient's granddaughter with complaints of increasing weakness and falls.  MDM/Assessment & Plan:   1. Frequent falls at home-patient high risk for fracture given frequent falls.  She has been seen by PT and they are recommending SNF placement.   Social worker is working on placement with the family. 2. E. coli UTI-treated with IV Levaquin.  Levaquin discontinued 09/17/2019. 3. Community acquired pneumonia-patient is being treated with IV Levaquin and is clinically responding to treatment and feeling better.  Continue supportive care.  Treated with Levaquin.  Levaquin discontinued 09/17/2019. 4. Type 2 diabetes mellitus-blood sugars fairly well controlled on current regimen continue to follow closely. 5. Acute on chronic diastolic CHF-patient has been diuresing with Lasix and we have resumed home oral Lasix. 6. Essential hypertension-blood pressures well controlled continue to monitor. 7. Dementia with episodes of acute delirium-continue delirium precautions, IM haldol for acute agitation, nightly dose of seroquel ordered.  8. Right hip pain -x-rays negative for acute fracture.  Continue PT and rehab.  Will schedule Tylenol for pain symptoms.  DVT prophylaxis: Lovenox Code Status: DNR Family Communication: Attempted to reach grandaughter and grandson x 2 with no answer to either number Disposition Plan: From home but due to frequent falls and general debility will require SNF rehabilitation prior to going home.   Consultants:    Procedures:    Antimicrobials:  Levofloxacin discontinued 09/17/19  Subjective: Patient was sundowning and agitated overnight but eating and drinking well this morning, says right leg hurts  sometimes.     Objective: Vitals:   09/17/19 1648 09/17/19 2145 09/18/19 0417 09/18/19 0936  BP: 123/60 (!) 174/70 (!) 154/54 (!) 169/56  Pulse: 72 81 95 90  Resp: 18 18 19    Temp: 97.9 F (36.6 C) 98.2 F (36.8 C) 99.3 F (37.4 C)   TempSrc: Oral Oral Oral   SpO2: 96% 97% 92% 99%  Weight:      Height:        Intake/Output Summary (Last 24 hours) at 09/18/2019 1141 Last data filed at 09/18/2019 0100 Gross per 24 hour  Intake 420 ml  Output 400 ml  Net 20 ml   Filed Weights   09/14/19 1546  Weight: 75.8 kg   REVIEW OF SYSTEMS  UTO due to dementia  Exam:  General exam: elderly female with dementia, awake, alert, NAD.  Respiratory system:  No increased work of breathing. Cardiovascular system: S1 & S2 heard. No JVD, murmurs, gallops, clicks or pedal edema. Gastrointestinal system: Abdomen is nondistended, soft and nontender. Normal bowel sounds heard. Central nervous system: Alert and with dementia. No focal neurological deficits. Extremities: diffuse osteoarthritic changes.   Data Reviewed: Basic Metabolic Panel: Recent Labs  Lab 09/14/19 2021 09/15/19 0438 09/16/19 0447  NA 139 139 141  K 3.5 3.4* 3.6  CL 106 104 106  CO2 23 26 26   GLUCOSE 172* 121* 94  BUN 19 18 14   CREATININE 1.08* 0.97 0.80  CALCIUM 8.0* 8.0* 8.2*  MG  --   --  1.7   Liver Function Tests: Recent Labs  Lab 09/14/19 2021 09/15/19 0438  AST 21 24  ALT 13 14  ALKPHOS 60 54  BILITOT 0.8 0.7  PROT 6.7 6.4*  ALBUMIN 2.8* 2.6*  No results for input(s): LIPASE, AMYLASE in the last 168 hours. No results for input(s): AMMONIA in the last 168 hours. CBC: Recent Labs  Lab 09/14/19 2021 09/15/19 0438 09/16/19 0447  WBC 11.4* 11.5* 8.1  NEUTROABS 8.9*  --   --   HGB 13.4 12.3 12.8  HCT 42.6 38.8 40.8  MCV 91.6 90.9 90.9  PLT 330 302 286   Cardiac Enzymes: No results for input(s): CKTOTAL, CKMB, CKMBINDEX, TROPONINI in the last 168 hours. CBG (last 3)  Recent Labs     09/17/19 1649 09/17/19 2022 09/18/19 0753  GLUCAP 146* 263* 210*   Recent Results (from the past 240 hour(s))  Urine Culture     Status: Abnormal   Collection Time: 09/11/19  2:14 PM   Specimen: Urine, Clean Catch  Result Value Ref Range Status   Specimen Description   Final    URINE, CLEAN CATCH Performed at [facility], [address] [place], [place] [address]    Special Requests   Final    NONE Performed at [facility], [address], [place], [place] [address]    Culture >=100,000 COLONIES/mL ESCHERICHIA COLI (A)  Final   Report Status 09/14/2019 FINAL  Final   Organism ID, Bacteria ESCHERICHIA COLI (A)  Final      Susceptibility   Escherichia coli - MIC*    AMPICILLIN <=2 SENSITIVE Sensitive     CEFAZOLIN <=4 SENSITIVE Sensitive     CEFTRIAXONE <=0.25 SENSITIVE Sensitive     CIPROFLOXACIN <=0.25 SENSITIVE Sensitive     GENTAMICIN <=1 SENSITIVE Sensitive     IMIPENEM <=0.25 SENSITIVE Sensitive     NITROFURANTOIN <=16 SENSITIVE Sensitive     TRIMETH/SULFA <=20 SENSITIVE Sensitive     AMPICILLIN/SULBACTAM <=2 SENSITIVE Sensitive     PIP/TAZO <=4 SENSITIVE Sensitive     * >=100,000 COLONIES/mL ESCHERICHIA COLI  Blood culture (routine x 2)     Status: None (Preliminary result)   Collection Time: 09/14/19  9:35 PM   Specimen: BLOOD LEFT HAND  Result Value Ref Range Status   Specimen Description BLOOD LEFT HAND  Final   Special Requests   Final    BOTTLES DRAWN AEROBIC AND ANAEROBIC Blood Culture adequate volume   Culture   Final    NO GROWTH 3 DAYS Performed at [facility], [address] [place], [place] [address]    Report Status PENDING  Incomplete  Blood culture (routine x 2)     Status: None (Preliminary result)   Collection Time: 09/14/19  9:45 PM   Specimen: BLOOD RIGHT HAND  Result Value Ref Range Status   Specimen Description BLOOD RIGHT HAND  Final   Special Requests   Final    BOTTLES DRAWN AEROBIC AND ANAEROBIC Blood Culture adequate volume    Culture   Final    NO GROWTH 3 DAYS Performed at [facility], [address], [place], [place] [address]    Report Status PENDING  Incomplete  SARS CORONAVIRUS 2 (TAT 6-24 HRS) Nasopharyngeal Nasopharyngeal Swab     Status: None   Collection Time: 09/15/19 12:08 AM   Specimen: Nasopharyngeal Swab  Result Value Ref Range Status   SARS Coronavirus 2 NEGATIVE NEGATIVE Final    Comment: (NOTE) SARS-CoV-2 target nucleic acids are NOT DETECTED. The SARS-CoV-2 RNA is generally detectable in upper and lower respiratory specimens during the acute phase of infection. Negative results do not preclude SARS-CoV-2 infection, do not rule out co-infections with other pathogens, and should not be used as  the sole basis for treatment or other patient management decisions. Negative results must be combined with clinical observations, patient history, and epidemiological information. The expected result is Negative. Fact Sheet for Patients: SugarRoll.be Fact Sheet for Healthcare Providers: https://www.woods-mathews.com/ This test is not yet approved or cleared by the [place] FDA and  has been authorized for detection and/or diagnosis of SARS-CoV-2 by FDA under an Emergency Use Authorization (EUA). This EUA will remain  in effect (meaning this test can be used) for the duration of the COVID-19 declaration under Section 56 4(b)(1) of the Act, 21 U.S.C. section 360bbb-3(b)(1), unless the authorization is terminated or revoked sooner. Performed at [facility], [place] [address], [place], [place] [address]      Studies: DG HIP UNILAT WITH PELVIS 2-3 VIEWS RIGHT  Result Date: 09/16/2019 CLINICAL DATA:  Confusion and multiple recent falls. Right hip and buttock pain. EXAM: DG HIP (WITH OR WITHOUT PELVIS) 2-3V RIGHT COMPARISON:  None. FINDINGS: Prominent soft tissue edema in the bilateral hip fat planes, probably anasarca. Numerous surgical clips in the  pelvis. Chronic-appearing cortical avulsion injuries of the right femur lesser trochanter and left femur greater trochanter. Moderate degenerative changes. No acute fracture lucency or dislocation of the hips or bony pelvis. Intact first and second sacral neural arches. IMPRESSION: 1. No acute bony abnormality. 2. Chronic-appearing cortical avulsion injuries of the right femur lesser trochanter and left femur greater trochanter. 3. Moderate skeletal degenerative change. 4. Anasarca. Electronically Signed   By: Revonda Humphrey   On: 09/16/2019 16:38   Scheduled Meds: . acetaminophen  650 mg Oral Q6H   Or  . acetaminophen  650 mg Rectal Q6H  . amLODipine  10 mg Oral q morning - 10a  . carvedilol  6.25 mg Oral BID WC  . docusate sodium  100 mg Oral BID  . enoxaparin (LOVENOX) injection  40 mg Subcutaneous Daily  . furosemide  40 mg Oral q morning - 10a  . insulin aspart  0-9 Units Subcutaneous TID WC  . insulin glargine  25 Units Subcutaneous QHS  . potassium chloride  10 mEq Oral Daily  . prednisoLONE acetate  1 drop Both Eyes QID  . QUEtiapine  25 mg Oral QHS   Continuous Infusions:   Active Problems:   UTI (urinary tract infection)   Community acquired pneumonia of left lower lobe of lung   Time spent:   Irwin Brakeman, MD [facility] 09/18/2019, 11:41 AM    LOS: 4 days  How to contact the [facility] Attending or Consulting provider [place] or covering provider during after hours [place], for this patient?  1. Check the care team in [facility] and look for a) attending/consulting [facility] provider listed and b) the [place] team listed 2. Log into [email or web address] and use [place]'s universal password to access. If you do not have the password, please contact the hospital operator. 3. Locate the [facility] provider you are looking for under [facility] and page to a number that you can be directly reached. 4. If you still have difficulty reaching the provider, please page the [facility] (Director on Call)  for the Hospitalists listed on amion for assistance.

## 2019-09-19 DIAGNOSIS — R531 Weakness: Secondary | ICD-10-CM | POA: Diagnosis not present

## 2019-09-19 DIAGNOSIS — I10 Essential (primary) hypertension: Secondary | ICD-10-CM | POA: Diagnosis not present

## 2019-09-19 DIAGNOSIS — N390 Urinary tract infection, site not specified: Secondary | ICD-10-CM | POA: Diagnosis not present

## 2019-09-19 DIAGNOSIS — I5032 Chronic diastolic (congestive) heart failure: Secondary | ICD-10-CM | POA: Diagnosis not present

## 2019-09-19 DIAGNOSIS — R5381 Other malaise: Secondary | ICD-10-CM | POA: Diagnosis not present

## 2019-09-19 DIAGNOSIS — M6281 Muscle weakness (generalized): Secondary | ICD-10-CM | POA: Diagnosis not present

## 2019-09-19 DIAGNOSIS — R609 Edema, unspecified: Secondary | ICD-10-CM | POA: Diagnosis not present

## 2019-09-19 DIAGNOSIS — R296 Repeated falls: Secondary | ICD-10-CM | POA: Diagnosis not present

## 2019-09-19 DIAGNOSIS — D649 Anemia, unspecified: Secondary | ICD-10-CM | POA: Diagnosis not present

## 2019-09-19 DIAGNOSIS — J189 Pneumonia, unspecified organism: Secondary | ICD-10-CM | POA: Diagnosis not present

## 2019-09-19 DIAGNOSIS — Z743 Need for continuous supervision: Secondary | ICD-10-CM | POA: Diagnosis not present

## 2019-09-19 DIAGNOSIS — I509 Heart failure, unspecified: Secondary | ICD-10-CM | POA: Diagnosis not present

## 2019-09-19 DIAGNOSIS — Z7401 Bed confinement status: Secondary | ICD-10-CM | POA: Diagnosis not present

## 2019-09-19 DIAGNOSIS — R52 Pain, unspecified: Secondary | ICD-10-CM | POA: Diagnosis not present

## 2019-09-19 DIAGNOSIS — L309 Dermatitis, unspecified: Secondary | ICD-10-CM | POA: Diagnosis not present

## 2019-09-19 DIAGNOSIS — E119 Type 2 diabetes mellitus without complications: Secondary | ICD-10-CM | POA: Diagnosis not present

## 2019-09-19 LAB — CULTURE, BLOOD (ROUTINE X 2)
Culture: NO GROWTH
Culture: NO GROWTH
Special Requests: ADEQUATE
Special Requests: ADEQUATE

## 2019-09-19 LAB — GLUCOSE, CAPILLARY
Glucose-Capillary: 138 mg/dL — ABNORMAL HIGH (ref 70–99)
Glucose-Capillary: 125 mg/dL — ABNORMAL HIGH (ref 70–99)
Glucose-Capillary: 212 mg/dL — ABNORMAL HIGH (ref 70–99)

## 2019-09-19 LAB — SARS CORONAVIRUS 2 (TAT 6-24 HRS): SARS Coronavirus 2: NEGATIVE

## 2019-09-19 MED ORDER — INSULIN GLARGINE 100 UNIT/ML ~~LOC~~ SOLN: 20.0000 [IU] | Freq: Every day | SUBCUTANEOUS | 0 refills | Status: AC

## 2019-09-19 MED ORDER — LINAGLIPTIN 5 MG PO TABS: 5.0000 mg | ORAL_TABLET | Freq: Every day | ORAL | Status: AC

## 2019-09-19 MED ORDER — ACETAMINOPHEN 325 MG PO TABS: 650.0000 mg | ORAL_TABLET | Freq: Three times a day (TID) | ORAL | Status: AC

## 2019-09-19 MED ORDER — FENTANYL CITRATE (PF) 100 MCG/2ML IJ SOLN: 25.0000 ug | INTRAMUSCULAR | Status: DC | PRN

## 2019-09-19 MED ORDER — ALPRAZOLAM 0.5 MG PO TABS: 0.5000 mg | ORAL_TABLET | Freq: Three times a day (TID) | ORAL | 0 refills | Status: DC | PRN

## 2019-09-19 MED ORDER — DOCUSATE SODIUM 100 MG PO CAPS: 100.0000 mg | ORAL_CAPSULE | Freq: Every day | ORAL | 0 refills | Status: AC

## 2019-09-19 NOTE — Discharge Summary (Signed)
Physician Discharge Summary  [name] [id number] DOB: 1931/05/11 DOA: 09/14/2019  PCP: [name], MD  Admit date: 09/14/2019 Discharge date: 09/19/2019  Admitted From:  Home  Disposition:  SNF   Recommendations for Outpatient Follow-up:  1. Follow up with PCP in 1 weeks 2. Please obtain BMP/CBC in one week  Discharge Condition: STABLE   CODE STATUS: DNR    Brief Hospitalization Summary: Please see all hospital notes, images, labs for full details of the hospitalization. ADMISSION HPI:   [name]  is a 86 y.o. female, with history of diastolic CHF, diabetes mellitus type 2, hypertension who was brought to the hospital by patient's granddaughter with complaints of increasing weakness and falls.  Today she fell in the parking lot when she was trying to walk into the physician's office.  Her granddaughter and other family members tried to catch her, she fell backwards hitting her head on the pavement.  There was no loss of consciousness.  Patient was diagnosed with COVID-19 infection in January , at that time she spent some time in rehab due to deconditioning and has been at home for past month.  But she continued to have persistent weakness.  As per granddaughter patient was having extreme difficulty getting up from seated position even for transfers to wheelchair.  Patient has been more confused than usual.  She was seen in the ED 3 days ago at that time she was diagnosed with UTI and was sent home on Keflex.  Patient developed pruritic rash on lower extremities.  Granddaughter stopped the Keflex suspecting allergic reaction to antibiotic. In the ED patient was found to have possible pneumonia.  She was started on IV Levaquin. Also had wheezing bilaterally, she does have history of diastolic CHF.  Chest x-ray did show pulmonary vascular congestion so she was given 1 dose of Lasix 40 mg IV.  Patient denies chest pain or shortness of breath.  Denies nausea vomiting or diarrhea.  Brief  Admission Hx: 86 y.o.female,with history of diastolic CHF, diabetes mellitus type 2, hypertension who was brought to the hospital by patient's granddaughter with complaints of increasing weakness and falls.  MDM/Assessment & Plan:   1. Frequent falls at home-patient high risk for fracture given frequent falls.  She has been seen by PT and they are recommending SNF placement.   2. E. coli UTI-treated with IV Levaquin.  Levaquin discontinued 09/17/2019. 3. Community acquired pneumonia-patient is being treated with IV Levaquin and is clinically responding to treatment and feeling better.    Treated with Levaquin.  Levaquin discontinued 09/17/2019. 4. Type 2 diabetes mellitus-resume home treatment plan.  5. Acute on chronic diastolic CHF-patient has been diuresed with Lasix and we have resumed home oral Lasix. 6. Essential hypertension-blood pressures well controlled continue to monitor. 7. Dementia with episodes of acute delirium-delirium precautions in the hospita, IM haldol for acute agitation, nightly dose of seroquel ordered.  8. Right hip pain -x-rays negative for acute fracture.  Continue PT and rehab.  Will schedule Tylenol for pain symptoms.  DVT prophylaxis: Lovenox Code Status: DNR Family Communication: Attempted to reach grandaughter and grandson x 2 with no answer to either number Disposition Plan: From home but due to frequent falls and general debility will require SNF rehabilitation prior to going home.   Consultants:    Procedures:    Antimicrobials:  Levofloxacin discontinued 09/17/19  Discharge Diagnoses:  Active Problems:   UTI (urinary tract infection)   Community acquired pneumonia of left lower lobe of  lung   Discharge Instructions:  Allergies as of 09/19/2019      Reactions   Ace Inhibitors Dermatitis   Glipizide    rash   Neosporin [neomycin-bacitracin Zn-polymyx] Dermatitis   Spironolactone    rash   Diovan [valsartan] Rash   Keflex  [cephalexin] Rash      Medication List    STOP taking these medications   cephALEXin 500 MG capsule Commonly known as: KEFLEX   glipiZIDE 10 MG 24 hr tablet Commonly known as: GLUCOTROL XL     TAKE these medications   acetaminophen 325 MG tablet Commonly known as: TYLENOL Take 2 tablets (650 mg total) by mouth in the morning, at noon, and at bedtime.   ALPRAZolam 0.5 MG tablet Commonly known as: XANAX Take 1 tablet (0.5 mg total) by mouth 3 (three) times daily as needed for anxiety or sleep. What changed:   when to take this  reasons to take this   amLODipine 10 MG tablet Commonly known as: NORVASC Take 10 mg by mouth every morning.   carvedilol 6.25 MG tablet Commonly known as: COREG Take 1 tablet (6.25 mg total) by mouth 2 (two) times daily with a meal.   docusate sodium 100 MG capsule Commonly known as: COLACE Take 1 capsule (100 mg total) by mouth daily. What changed: when to take this   furosemide 40 MG tablet Commonly known as: LASIX Take 1 tablet (40 mg total) by mouth every morning.   HumaLOG KwikPen 200 UNIT/ML KwikPen Generic drug: insulin lispro Inject 0-15 Units into the skin 3 (three) times daily with meals. CBG 70-120--no units; 121-150--2 units; 151-200--3 units; 201-250--5 units; 251-300--8 units; 301-350--11 units; 351-400--15 units   insulin glargine 100 UNIT/ML injection Commonly known as: LANTUS Inject 0.2 mLs (20 Units total) into the skin at bedtime. What changed: how much to take   linagliptin 5 MG Tabs tablet Commonly known as: Tradjenta Take 1 tablet (5 mg total) by mouth daily.   metFORMIN 500 MG 24 hr tablet Commonly known as: GLUCOPHAGE-XR Take 500 mg by mouth 2 (two) times daily.   potassium chloride 10 MEQ tablet Commonly known as: KLOR-CON Take 1 tablet (10 mEq total) by mouth daily.   prednisoLONE acetate 1 % ophthalmic suspension Commonly known as: PRED FORTE Place 1 drop into both eyes 4 (four) times daily.    triamcinolone cream 0.1 % Commonly known as: KENALOG APPLY TWICE DAILY      Contact information for after-discharge care    Destination    [facility] .   Service: Skilled Nursing Contact information: [address] [place] [phone]             Allergies  Allergen Reactions  . Ace Inhibitors Dermatitis  . Glipizide     rash  . Neosporin [Neomycin-Bacitracin Zn-Polymyx] Dermatitis  . Spironolactone     rash  . Diovan [Valsartan] Rash  . Keflex [Cephalexin] Rash   Allergies as of 09/19/2019      Reactions   Ace Inhibitors Dermatitis   Glipizide    rash   Neosporin [neomycin-bacitracin Zn-polymyx] Dermatitis   Spironolactone    rash   Diovan [valsartan] Rash   Keflex [cephalexin] Rash      Medication List    STOP taking these medications   cephALEXin 500 MG capsule Commonly known as: KEFLEX   glipiZIDE 10 MG 24 hr tablet Commonly known as: GLUCOTROL XL     TAKE these  medications   acetaminophen 325 MG tablet Commonly known as: TYLENOL Take 2 tablets (650 mg total) by mouth in the morning, at noon, and at bedtime.   ALPRAZolam 0.5 MG tablet Commonly known as: XANAX Take 1 tablet (0.5 mg total) by mouth 3 (three) times daily as needed for anxiety or sleep. What changed:   when to take this  reasons to take this   amLODipine 10 MG tablet Commonly known as: NORVASC Take 10 mg by mouth every morning.   carvedilol 6.25 MG tablet Commonly known as: COREG Take 1 tablet (6.25 mg total) by mouth 2 (two) times daily with a meal.   docusate sodium 100 MG capsule Commonly known as: COLACE Take 1 capsule (100 mg total) by mouth daily. What changed: when to take this   furosemide 40 MG tablet Commonly known as: LASIX Take 1 tablet (40 mg total) by mouth every morning.   HumaLOG KwikPen 200 UNIT/ML KwikPen Generic drug: insulin lispro Inject 0-15 Units into the skin 3  (three) times daily with meals. CBG 70-120--no units; 121-150--2 units; 151-200--3 units; 201-250--5 units; 251-300--8 units; 301-350--11 units; 351-400--15 units   insulin glargine 100 UNIT/ML injection Commonly known as: LANTUS Inject 0.2 mLs (20 Units total) into the skin at bedtime. What changed: how much to take   linagliptin 5 MG Tabs tablet Commonly known as: Tradjenta Take 1 tablet (5 mg total) by mouth daily.   metFORMIN 500 MG 24 hr tablet Commonly known as: GLUCOPHAGE-XR Take 500 mg by mouth 2 (two) times daily.   potassium chloride 10 MEQ tablet Commonly known as: KLOR-CON Take 1 tablet (10 mEq total) by mouth daily.   prednisoLONE acetate 1 % ophthalmic suspension Commonly known as: PRED FORTE Place 1 drop into both eyes 4 (four) times daily.   triamcinolone cream 0.1 % Commonly known as: KENALOG APPLY TWICE DAILY       Procedures/Studies: DG Chest 2 View  Result Date: 09/14/2019 CLINICAL DATA:  Shortness of breath, edema EXAM: CHEST - 2 VIEW COMPARISON:  09/11/2019 FINDINGS: Stable elevation of the right hemidiaphragm. Pulmonary vascular congestion. Patchy density at the left lung base. No pleural effusion. No pneumothorax. Stable cardiomediastinal contours. IMPRESSION: Pulmonary vascular congestion and mild patchy atelectasis/consolidation at the left lung base. Electronically Signed   By: Macy Mis M.D.   On: 09/14/2019 19:04   CT Head Wo Contrast  Result Date: 09/14/2019 CLINICAL DATA:  Head trauma multiple falls EXAM: CT HEAD WITHOUT CONTRAST TECHNIQUE: Contiguous axial images were obtained from the base of the skull through the vertex without intravenous contrast. COMPARISON:  None. FINDINGS: Brain: No acute intracranial hemorrhage. No focal mass lesion. No CT evidence of acute infarction. No midline shift or mass effect. No hydrocephalus. Basilar cisterns are patent. There are periventricular and subcortical white matter hypodensities. Generalized cortical  atrophy. Low-density through the pons (image 30/6, image 8/2) is favored artifact from the skull base. Vascular: No hyperdense vessel or unexpected calcification. Skull: Normal. Negative for fracture or focal lesion. Sinuses/Orbits: Paranasal sinuses and mastoid air cells are clear. Orbits are clear. Other: None. IMPRESSION: 1. No intracranial trauma. 2. Mild atrophy and mild periventricular white matter microvascular disease. Electronically Signed   By: Suzy Bouchard M.D.   On: 09/14/2019 18:57   CT Head Wo Contrast  Result Date: 09/11/2019 CLINICAL DATA:  Multiple falls EXAM: CT HEAD WITHOUT CONTRAST CT CERVICAL SPINE WITHOUT CONTRAST TECHNIQUE: Multidetector CT imaging of the head and cervical spine was performed following the standard protocol without  intravenous contrast. Multiplanar CT image reconstructions of the cervical spine were also generated. COMPARISON:  04/30/2017 FINDINGS: CT HEAD FINDINGS Brain: No evidence of acute infarction, hemorrhage, hydrocephalus, extra-axial collection or mass lesion/mass effect. Mild periventricular and deep white matter hypodensity. Vascular: No hyperdense vessel or unexpected calcification. Skull: Hyperostosis frontalis. Negative for fracture or focal lesion. Sinuses/Orbits: No acute finding. Other: None. CT CERVICAL SPINE FINDINGS Alignment: Normal. Skull base and vertebrae: No acute fracture. No primary bone lesion or focal pathologic process. Soft tissues and spinal canal: No prevertebral fluid or swelling. No visible canal hematoma. Disc levels: Moderate multilevel disc degenerative disease and osteophytosis of the cervical spine. Upper chest: Negative. Other: None. IMPRESSION: 1. No acute intracranial pathology. 2. No fracture or static subluxation of the cervical spine. 3. Disc degenerative disease of the cervical spine. Electronically Signed   By: Eddie Candle M.D.   On: 09/11/2019 12:30   CT Cervical Spine Wo Contrast  Result Date: 09/11/2019 CLINICAL  DATA:  Multiple falls EXAM: CT HEAD WITHOUT CONTRAST CT CERVICAL SPINE WITHOUT CONTRAST TECHNIQUE: Multidetector CT imaging of the head and cervical spine was performed following the standard protocol without intravenous contrast. Multiplanar CT image reconstructions of the cervical spine were also generated. COMPARISON:  04/30/2017 FINDINGS: CT HEAD FINDINGS Brain: No evidence of acute infarction, hemorrhage, hydrocephalus, extra-axial collection or mass lesion/mass effect. Mild periventricular and deep white matter hypodensity. Vascular: No hyperdense vessel or unexpected calcification. Skull: Hyperostosis frontalis. Negative for fracture or focal lesion. Sinuses/Orbits: No acute finding. Other: None. CT CERVICAL SPINE FINDINGS Alignment: Normal. Skull base and vertebrae: No acute fracture. No primary bone lesion or focal pathologic process. Soft tissues and spinal canal: No prevertebral fluid or swelling. No visible canal hematoma. Disc levels: Moderate multilevel disc degenerative disease and osteophytosis of the cervical spine. Upper chest: Negative. Other: None. IMPRESSION: 1. No acute intracranial pathology. 2. No fracture or static subluxation of the cervical spine. 3. Disc degenerative disease of the cervical spine. Electronically Signed   By: Eddie Candle M.D.   On: 09/11/2019 12:30   DG Pelvis Portable  Result Date: 09/11/2019 CLINICAL DATA:  Fall with pelvic pain.  Initial encounter. EXAM: PORTABLE PELVIS 1-2 VIEWS COMPARISON:  08/31/2016 FINDINGS: No acute fracture or dislocation noted. Degenerative changes in both hips and LOWER lumbar spine again noted. No suspicious bony lesions are present. Surgical clips within the anatomic pelvis again noted. IMPRESSION: No evidence of acute abnormality. Electronically Signed   By: Margarette Canada M.D.   On: 09/11/2019 11:44   DG Chest Port 1 View  Result Date: 09/11/2019 CLINICAL DATA:  Fall and weakness. EXAM: PORTABLE CHEST 1 VIEW COMPARISON:  07/26/2019  FINDINGS: UPPER limits normal heart size and mild pulmonary vascular congestion noted. Mild LEFT basilar opacities are present. No definite pleural effusion or pneumothorax. No acute bony abnormality noted. IMPRESSION: UPPER normal heart with mild pulmonary vascular congestion Mild LEFT basilar opacities, favor atelectasis but airspace disease/pneumonia is not excluded. Electronically Signed   By: Margarette Canada M.D.   On: 09/11/2019 11:42   DG HIP UNILAT WITH PELVIS 2-3 VIEWS RIGHT  Result Date: 09/16/2019 CLINICAL DATA:  Confusion and multiple recent falls. Right hip and buttock pain. EXAM: DG HIP (WITH OR WITHOUT PELVIS) 2-3V RIGHT COMPARISON:  None. FINDINGS: Prominent soft tissue edema in the bilateral hip fat planes, probably anasarca. Numerous surgical clips in the pelvis. Chronic-appearing cortical avulsion injuries of the right femur lesser trochanter and left femur greater trochanter. Moderate degenerative changes. No acute fracture  lucency or dislocation of the hips or bony pelvis. Intact first and second sacral neural arches. IMPRESSION: 1. No acute bony abnormality. 2. Chronic-appearing cortical avulsion injuries of the right femur lesser trochanter and left femur greater trochanter. 3. Moderate skeletal degenerative change. 4. Anasarca. Electronically Signed   By: Revonda Humphrey   On: 09/16/2019 16:38      Subjective: Pt appears comfortable, eating and drinking well.    Discharge Exam: Vitals:   09/19/19 0501 09/19/19 0557  BP: (!) 170/63 (!) 158/64  Pulse: 75 75  Resp: 18   Temp: 99 F (37.2 C)   SpO2: 95%    Vitals:   09/18/19 1956 09/18/19 2133 09/19/19 0501 09/19/19 0557  BP:  139/61 (!) 170/63 (!) 158/64  Pulse:  72 75 75  Resp:  18 18   Temp:  98.7 F (37.1 C) 99 F (37.2 C)   TempSrc:  Oral Oral   SpO2: 94% 100% 95%   Weight:      Height:       General exam: elderly female with dementia, awake, alert, NAD.  Respiratory system:  No increased work of  breathing. Cardiovascular system: S1 & S2 heard. No JVD, murmurs, gallops, clicks or pedal edema. Gastrointestinal system: Abdomen is nondistended, soft and nontender. Normal bowel sounds heard. Central nervous system: Alert and with dementia. No focal neurological deficits. Extremities: diffuse osteoarthritic changes.    The results of significant diagnostics from this hospitalization (including imaging, microbiology, ancillary and laboratory) are listed below for reference.     Microbiology: Recent Results (from the past 240 hour(s))  Urine Culture     Status: Abnormal   Collection Time: 09/11/19  2:14 PM   Specimen: Urine, Clean Catch  Result Value Ref Range Status   Specimen Description   Final    URINE, CLEAN CATCH Performed at [facility], [address], [place], [place] [address]    Special Requests   Final    NONE Performed at [facility], [address], [place], [place] [address]    Culture >=100,000 COLONIES/mL ESCHERICHIA COLI (A)  Final   Report Status 09/14/2019 FINAL  Final   Organism ID, Bacteria ESCHERICHIA COLI (A)  Final      Susceptibility   Escherichia coli - MIC*    AMPICILLIN <=2 SENSITIVE Sensitive     CEFAZOLIN <=4 SENSITIVE Sensitive     CEFTRIAXONE <=0.25 SENSITIVE Sensitive     CIPROFLOXACIN <=0.25 SENSITIVE Sensitive     GENTAMICIN <=1 SENSITIVE Sensitive     IMIPENEM <=0.25 SENSITIVE Sensitive     NITROFURANTOIN <=16 SENSITIVE Sensitive     TRIMETH/SULFA <=20 SENSITIVE Sensitive     AMPICILLIN/SULBACTAM <=2 SENSITIVE Sensitive     PIP/TAZO <=4 SENSITIVE Sensitive     * >=100,000 COLONIES/mL ESCHERICHIA COLI  Blood culture (routine x 2)     Status: None   Collection Time: 09/14/19  9:35 PM   Specimen: BLOOD LEFT HAND  Result Value Ref Range Status   Specimen Description BLOOD LEFT HAND  Final   Special Requests   Final    BOTTLES DRAWN AEROBIC AND ANAEROBIC Blood Culture adequate volume   Culture   Final    NO GROWTH 5 DAYS Performed at  [facility], [address], [place],  [address]    Report Status 09/19/2019 FINAL  Final  Blood culture (routine x 2)     Status: None   Collection Time: 09/14/19  9:45 PM   Specimen: BLOOD RIGHT HAND  Result  Value Ref Range Status   Specimen Description BLOOD RIGHT HAND  Final   Special Requests   Final    BOTTLES DRAWN AEROBIC AND ANAEROBIC Blood Culture adequate volume   Culture   Final    NO GROWTH 5 DAYS Performed at [facility], [address], [place], [place] [address]    Report Status 09/19/2019 FINAL  Final  SARS CORONAVIRUS 2 (TAT 6-24 HRS) Nasopharyngeal Nasopharyngeal Swab     Status: None   Collection Time: 09/15/19 12:08 AM   Specimen: Nasopharyngeal Swab  Result Value Ref Range Status   SARS Coronavirus 2 NEGATIVE NEGATIVE Final    Comment: (NOTE) SARS-CoV-2 target nucleic acids are NOT DETECTED. The SARS-CoV-2 RNA is generally detectable in upper and lower respiratory specimens during the acute phase of infection. Negative results do not preclude SARS-CoV-2 infection, do not rule out co-infections with other pathogens, and should not be used as the sole basis for treatment or other patient management decisions. Negative results must be combined with clinical observations, patient history, and epidemiological information. The expected result is Negative. Fact Sheet for Patients: SugarRoll.be Fact Sheet for Healthcare Providers: https://www.woods-mathews.com/ This test is not yet approved or cleared by the [place] FDA and  has been authorized for detection and/or diagnosis of SARS-CoV-2 by FDA under an Emergency Use Authorization (EUA). This EUA will remain  in effect (meaning this test can be used) for the duration of the COVID-19 declaration under Section 56 4(b)(1) of the Act, 21 U.S.C. section 360bbb-3(b)(1), unless the authorization is terminated or revoked sooner. Performed at [facility],  [place] [address], [place], [place] [address]   SARS CORONAVIRUS 2 (TAT 6-24 HRS) Nasopharyngeal Nasopharyngeal Swab     Status: None   Collection Time: 09/18/19  4:39 PM   Specimen: Nasopharyngeal Swab  Result Value Ref Range Status   SARS Coronavirus 2 NEGATIVE NEGATIVE Final    Comment: (NOTE) SARS-CoV-2 target nucleic acids are NOT DETECTED. The SARS-CoV-2 RNA is generally detectable in upper and lower respiratory specimens during the acute phase of infection. Negative results do not preclude SARS-CoV-2 infection, do not rule out co-infections with other pathogens, and should not be used as the sole basis for treatment or other patient management decisions. Negative results must be combined with clinical observations, patient history, and epidemiological information. The expected result is Negative. Fact Sheet for Patients: SugarRoll.be Fact Sheet for Healthcare Providers: https://www.woods-mathews.com/ This test is not yet approved or cleared by the [place] FDA and  has been authorized for detection and/or diagnosis of SARS-CoV-2 by FDA under an Emergency Use Authorization (EUA). This EUA will remain  in effect (meaning this test can be used) for the duration of the COVID-19 declaration under Section 56 4(b)(1) of the Act, 21 U.S.C. section 360bbb-3(b)(1), unless the authorization is terminated or revoked sooner. Performed at [facility], [place] [address] [place], [place] [address]      Labs: BNP (last 3 results) Recent Labs    07/21/19 2220 09/14/19 2021  BNP 112.8* [id number]   Basic Metabolic Panel: Recent Labs  Lab 09/14/19 2021 09/15/19 0438 09/16/19 0447  NA 139 139 141  K 3.5 3.4* 3.6  CL 106 104 106  CO2 23 26 26   GLUCOSE 172* 121* 94  BUN 19 18 14   CREATININE 1.08* 0.97 0.80  CALCIUM 8.0* 8.0* 8.2*  MG  --   --  1.7   Liver Function Tests: Recent Labs  Lab 09/14/19 2021 09/15/19 [id number]  AST 21 24  ALT 13  14  ALKPHOS 60 54  BILITOT 0.8 0.7  PROT 6.7 6.4*  ALBUMIN 2.8* 2.6*   No results for input(s): LIPASE, AMYLASE in the last 168 hours. No results for input(s): AMMONIA in the last 168 hours. CBC: Recent Labs  Lab 09/14/19 2021 09/15/19 0438 09/16/19 0447  WBC 11.4* 11.5* 8.1  NEUTROABS 8.9*  --   --   HGB 13.4 12.3 12.8  HCT 42.6 38.8 40.8  MCV 91.6 90.9 90.9  PLT 330 302 286   Cardiac Enzymes: No results for input(s): CKTOTAL, CKMB, CKMBINDEX, TROPONINI in the last 168 hours. BNP: Invalid input(s): POCBNP CBG: Recent Labs  Lab 09/18/19 1643 09/18/19 2132 09/19/19 0255 09/19/19 0739 09/19/19 1137  GLUCAP 182* 186* 138* 125* 212*   D-Dimer No results for input(s): DDIMER in the last 72 hours. Hgb A1c No results for input(s): HGBA1C in the last 72 hours. Lipid Profile No results for input(s): CHOL, HDL, LDLCALC, TRIG, CHOLHDL, LDLDIRECT in the last 72 hours. Thyroid function studies No results for input(s): TSH, T4TOTAL, T3FREE, THYROIDAB in the last 72 hours.  Invalid input(s): FREET3 Anemia work up No results for input(s): VITAMINB12, FOLATE, FERRITIN, TIBC, IRON, RETICCTPCT in the last 72 hours. Urinalysis    Component Value Date/Time   COLORURINE YELLOW 09/11/2019 1346   APPEARANCEUR HAZY (A) 09/11/2019 1346   LABSPEC 1.009 09/11/2019 1346   PHURINE 6.0 09/11/2019 1346   GLUCOSEU NEGATIVE 09/11/2019 1346   HGBUR NEGATIVE 09/11/2019 1346   BILIRUBINUR NEGATIVE 09/11/2019 1346   KETONESUR NEGATIVE 09/11/2019 1346   PROTEINUR NEGATIVE 09/11/2019 1346   UROBILINOGEN 0.2 08/16/2010 2011   NITRITE POSITIVE (A) 09/11/2019 1346   LEUKOCYTESUR SMALL (A) 09/11/2019 1346   Sepsis Labs Invalid input(s): PROCALCITONIN,  WBC,  LACTICIDVEN Microbiology Recent Results (from the past 240 hour(s))  Urine Culture     Status: Abnormal   Collection Time: 09/11/19  2:14 PM   Specimen: Urine, Clean Catch  Result Value Ref Range Status   Specimen Description   Final     URINE, CLEAN CATCH Performed at [facility], [address], [place], [place] [address]    Special Requests   Final    NONE Performed at [facility], [address], [place], [place] [address]    Culture >=100,000 COLONIES/mL ESCHERICHIA COLI (A)  Final   Report Status 09/14/2019 FINAL  Final   Organism ID, Bacteria ESCHERICHIA COLI (A)  Final      Susceptibility   Escherichia coli - MIC*    AMPICILLIN <=2 SENSITIVE Sensitive     CEFAZOLIN <=4 SENSITIVE Sensitive     CEFTRIAXONE <=0.25 SENSITIVE Sensitive     CIPROFLOXACIN <=0.25 SENSITIVE Sensitive     GENTAMICIN <=1 SENSITIVE Sensitive     IMIPENEM <=0.25 SENSITIVE Sensitive     NITROFURANTOIN <=16 SENSITIVE Sensitive     TRIMETH/SULFA <=20 SENSITIVE Sensitive     AMPICILLIN/SULBACTAM <=2 SENSITIVE Sensitive     PIP/TAZO <=4 SENSITIVE Sensitive     * >=100,000 COLONIES/mL ESCHERICHIA COLI  Blood culture (routine x 2)     Status: None   Collection Time: 09/14/19  9:35 PM   Specimen: BLOOD LEFT HAND  Result Value Ref Range Status   Specimen Description BLOOD LEFT HAND  Final   Special Requests   Final    BOTTLES DRAWN AEROBIC AND ANAEROBIC Blood Culture adequate volume   Culture   Final    NO GROWTH 5 DAYS Performed at [facility],  [address] [place], [place] [address]    Report Status 09/19/2019 FINAL  Final  Blood culture (routine x 2)     Status: None   Collection Time: 09/14/19  9:45 PM   Specimen: BLOOD RIGHT HAND  Result Value Ref Range Status   Specimen Description BLOOD RIGHT HAND  Final   Special Requests   Final    BOTTLES DRAWN AEROBIC AND ANAEROBIC Blood Culture adequate volume   Culture   Final    NO GROWTH 5 DAYS Performed at [facility], [address] [place], [place] [address]    Report Status 09/19/2019 FINAL  Final  SARS CORONAVIRUS 2 (TAT 6-24 HRS) Nasopharyngeal Nasopharyngeal Swab     Status: None   Collection Time: 09/15/19 12:08 AM   Specimen: Nasopharyngeal Swab  Result Value Ref  Range Status   SARS Coronavirus 2 NEGATIVE NEGATIVE Final    Comment: (NOTE) SARS-CoV-2 target nucleic acids are NOT DETECTED. The SARS-CoV-2 RNA is generally detectable in upper and lower respiratory specimens during the acute phase of infection. Negative results do not preclude SARS-CoV-2 infection, do not rule out co-infections with other pathogens, and should not be used as the sole basis for treatment or other patient management decisions. Negative results must be combined with clinical observations, patient history, and epidemiological information. The expected result is Negative. Fact Sheet for Patients: SugarRoll.be Fact Sheet for Healthcare Providers: https://www.woods-mathews.com/ This test is not yet approved or cleared by the [place] FDA and  has been authorized for detection and/or diagnosis of SARS-CoV-2 by FDA under an Emergency Use Authorization (EUA). This EUA will remain  in effect (meaning this test can be used) for the duration of the COVID-19 declaration under Section 56 4(b)(1) of the Act, 21 U.S.C. section 360bbb-3(b)(1), unless the authorization is terminated or revoked sooner. Performed at [facility], [place] [address], [place], [place] [address]   SARS CORONAVIRUS 2 (TAT 6-24 HRS) Nasopharyngeal Nasopharyngeal Swab     Status: None   Collection Time: 09/18/19  4:39 PM   Specimen: Nasopharyngeal Swab  Result Value Ref Range Status   SARS Coronavirus 2 NEGATIVE NEGATIVE Final    Comment: (NOTE) SARS-CoV-2 target nucleic acids are NOT DETECTED. The SARS-CoV-2 RNA is generally detectable in upper and lower respiratory specimens during the acute phase of infection. Negative results do not preclude SARS-CoV-2 infection, do not rule out co-infections with other pathogens, and should not be used as the sole basis for treatment or other patient management decisions. Negative results must be combined with  clinical observations, patient history, and epidemiological information. The expected result is Negative. Fact Sheet for Patients: SugarRoll.be Fact Sheet for Healthcare Providers: https://www.woods-mathews.com/ This test is not yet approved or cleared by the [place] FDA and  has been authorized for detection and/or diagnosis of SARS-CoV-2 by FDA under an Emergency Use Authorization (EUA). This EUA will remain  in effect (meaning this test can be used) for the duration of the COVID-19 declaration under Section 56 4(b)(1) of the Act, 21 U.S.C. section 360bbb-3(b)(1), unless the authorization is terminated or revoked sooner. Performed at [facility], [place] [address], [place], [place] [address]    Time coordinating discharge:  33 minutes   SIGNED:  Irwin Brakeman, MD  [facility] 09/19/2019, 1:19 PM How to contact the [facility] Attending or Consulting provider [place] or covering provider during after hours [place], for this patient?  1. Check the care team in [facility] and look for  a) attending/consulting [place] provider listed and b) the [facility] team listed 2. Log into [email or web address] and use [place]'s universal password to access. If you do not have the password, please contact the hospital operator. 3. Locate the [facility] provider you are looking for under [facility] and page to a number that you can be directly reached. 4. If you still have difficulty reaching the provider, please page the [facility] (Director on Call) for the Hospitalists listed on amion for assistance.

## 2019-09-19 NOTE — Care Management Important Message (Signed)
Important Message  Patient Details  Name: [name] MRN: [id number] Date of Birth: 05-May-1931   Medicare Important Message Given:  Yes     Tommy Medal 09/19/2019, 12:11 PM

## 2019-09-19 NOTE — TOC Progression Note (Signed)
Transition of Care [facility]) - Progression Note    Patient Details  Name: [name] MRN: [id number] Date of Birth: [birth year]  Transition of Care [facility]) CM/SW Contact  Kerri Gully, LCSW Phone Number: 09/19/2019, 11:46 AM  Clinical Narrative:    Updated progress note sent to [facility]. Advised [facility] representative that [facility] was selected facility. Spoke with Mr. Erskin Burnet and updated. Updated [name] at facility.         Expected Discharge Plan and Services                                                 Social Determinants of Health (SDOH) Interventions    Readmission Risk Interventions No flowsheet data found.

## 2019-09-21 DIAGNOSIS — R296 Repeated falls: Secondary | ICD-10-CM | POA: Diagnosis not present

## 2019-09-21 DIAGNOSIS — N390 Urinary tract infection, site not specified: Secondary | ICD-10-CM | POA: Diagnosis not present

## 2019-09-21 DIAGNOSIS — R5381 Other malaise: Secondary | ICD-10-CM | POA: Diagnosis not present

## 2019-09-22 DIAGNOSIS — E119 Type 2 diabetes mellitus without complications: Secondary | ICD-10-CM | POA: Diagnosis not present

## 2019-09-22 DIAGNOSIS — R5381 Other malaise: Secondary | ICD-10-CM | POA: Diagnosis not present

## 2019-09-22 DIAGNOSIS — I10 Essential (primary) hypertension: Secondary | ICD-10-CM | POA: Diagnosis not present

## 2019-09-28 DIAGNOSIS — J189 Pneumonia, unspecified organism: Secondary | ICD-10-CM | POA: Diagnosis not present

## 2019-09-28 DIAGNOSIS — N390 Urinary tract infection, site not specified: Secondary | ICD-10-CM | POA: Diagnosis not present

## 2019-09-28 DIAGNOSIS — R5381 Other malaise: Secondary | ICD-10-CM | POA: Diagnosis not present

## 2019-09-28 DIAGNOSIS — L309 Dermatitis, unspecified: Secondary | ICD-10-CM | POA: Diagnosis not present

## 2019-09-29 DIAGNOSIS — I10 Essential (primary) hypertension: Secondary | ICD-10-CM | POA: Diagnosis not present

## 2019-09-29 DIAGNOSIS — R5381 Other malaise: Secondary | ICD-10-CM | POA: Diagnosis not present

## 2019-09-29 DIAGNOSIS — I509 Heart failure, unspecified: Secondary | ICD-10-CM | POA: Diagnosis not present

## 2019-09-29 DIAGNOSIS — E119 Type 2 diabetes mellitus without complications: Secondary | ICD-10-CM | POA: Diagnosis not present

## 2019-10-09 DIAGNOSIS — I10 Essential (primary) hypertension: Secondary | ICD-10-CM | POA: Diagnosis not present

## 2019-10-09 DIAGNOSIS — I5032 Chronic diastolic (congestive) heart failure: Secondary | ICD-10-CM | POA: Diagnosis not present

## 2019-10-09 DIAGNOSIS — I509 Heart failure, unspecified: Secondary | ICD-10-CM | POA: Diagnosis not present

## 2019-10-09 DIAGNOSIS — E119 Type 2 diabetes mellitus without complications: Secondary | ICD-10-CM | POA: Diagnosis not present

## 2019-10-21 DIAGNOSIS — C189 Malignant neoplasm of colon, unspecified: Secondary | ICD-10-CM | POA: Diagnosis not present

## 2019-10-21 DIAGNOSIS — Z87440 Personal history of urinary (tract) infections: Secondary | ICD-10-CM | POA: Diagnosis not present

## 2019-10-21 DIAGNOSIS — Z4800 Encounter for change or removal of nonsurgical wound dressing: Secondary | ICD-10-CM | POA: Diagnosis not present

## 2019-10-21 DIAGNOSIS — L97219 Non-pressure chronic ulcer of right calf with unspecified severity: Secondary | ICD-10-CM | POA: Diagnosis not present

## 2019-10-21 DIAGNOSIS — L400 Psoriasis vulgaris: Secondary | ICD-10-CM | POA: Diagnosis not present

## 2019-10-21 DIAGNOSIS — R600 Localized edema: Secondary | ICD-10-CM | POA: Diagnosis not present

## 2019-10-21 DIAGNOSIS — I110 Hypertensive heart disease with heart failure: Secondary | ICD-10-CM | POA: Diagnosis not present

## 2019-10-21 DIAGNOSIS — K5792 Diverticulitis of intestine, part unspecified, without perforation or abscess without bleeding: Secondary | ICD-10-CM | POA: Diagnosis not present

## 2019-10-21 DIAGNOSIS — E119 Type 2 diabetes mellitus without complications: Secondary | ICD-10-CM | POA: Diagnosis not present

## 2019-10-21 DIAGNOSIS — I872 Venous insufficiency (chronic) (peripheral): Secondary | ICD-10-CM | POA: Diagnosis not present

## 2019-10-21 DIAGNOSIS — E1151 Type 2 diabetes mellitus with diabetic peripheral angiopathy without gangrene: Secondary | ICD-10-CM | POA: Diagnosis not present

## 2019-10-21 DIAGNOSIS — I10 Essential (primary) hypertension: Secondary | ICD-10-CM | POA: Diagnosis not present

## 2019-10-21 DIAGNOSIS — Z794 Long term (current) use of insulin: Secondary | ICD-10-CM | POA: Diagnosis not present

## 2019-10-21 DIAGNOSIS — G8929 Other chronic pain: Secondary | ICD-10-CM | POA: Diagnosis not present

## 2019-10-21 DIAGNOSIS — Z8616 Personal history of COVID-19: Secondary | ICD-10-CM | POA: Diagnosis not present

## 2019-10-21 DIAGNOSIS — I5032 Chronic diastolic (congestive) heart failure: Secondary | ICD-10-CM | POA: Diagnosis not present

## 2019-10-21 DIAGNOSIS — I5033 Acute on chronic diastolic (congestive) heart failure: Secondary | ICD-10-CM | POA: Diagnosis not present

## 2019-10-21 DIAGNOSIS — Z9181 History of falling: Secondary | ICD-10-CM | POA: Diagnosis not present

## 2019-10-24 DIAGNOSIS — L97219 Non-pressure chronic ulcer of right calf with unspecified severity: Secondary | ICD-10-CM | POA: Diagnosis not present

## 2019-10-24 DIAGNOSIS — K5792 Diverticulitis of intestine, part unspecified, without perforation or abscess without bleeding: Secondary | ICD-10-CM | POA: Diagnosis not present

## 2019-10-24 DIAGNOSIS — I5033 Acute on chronic diastolic (congestive) heart failure: Secondary | ICD-10-CM | POA: Diagnosis not present

## 2019-10-24 DIAGNOSIS — Z8616 Personal history of COVID-19: Secondary | ICD-10-CM | POA: Diagnosis not present

## 2019-10-24 DIAGNOSIS — Z794 Long term (current) use of insulin: Secondary | ICD-10-CM | POA: Diagnosis not present

## 2019-10-24 DIAGNOSIS — Z4800 Encounter for change or removal of nonsurgical wound dressing: Secondary | ICD-10-CM | POA: Diagnosis not present

## 2019-10-24 DIAGNOSIS — I110 Hypertensive heart disease with heart failure: Secondary | ICD-10-CM | POA: Diagnosis not present

## 2019-10-24 DIAGNOSIS — Z9181 History of falling: Secondary | ICD-10-CM | POA: Diagnosis not present

## 2019-10-24 DIAGNOSIS — Z87440 Personal history of urinary (tract) infections: Secondary | ICD-10-CM | POA: Diagnosis not present

## 2019-10-24 DIAGNOSIS — G8929 Other chronic pain: Secondary | ICD-10-CM | POA: Diagnosis not present

## 2019-10-24 DIAGNOSIS — L400 Psoriasis vulgaris: Secondary | ICD-10-CM | POA: Diagnosis not present

## 2019-10-24 DIAGNOSIS — I872 Venous insufficiency (chronic) (peripheral): Secondary | ICD-10-CM | POA: Diagnosis not present

## 2019-10-24 DIAGNOSIS — C189 Malignant neoplasm of colon, unspecified: Secondary | ICD-10-CM | POA: Diagnosis not present

## 2019-10-24 DIAGNOSIS — E1151 Type 2 diabetes mellitus with diabetic peripheral angiopathy without gangrene: Secondary | ICD-10-CM | POA: Diagnosis not present

## 2019-10-26 DIAGNOSIS — Z9181 History of falling: Secondary | ICD-10-CM | POA: Diagnosis not present

## 2019-10-26 DIAGNOSIS — L400 Psoriasis vulgaris: Secondary | ICD-10-CM | POA: Diagnosis not present

## 2019-10-26 DIAGNOSIS — Z794 Long term (current) use of insulin: Secondary | ICD-10-CM | POA: Diagnosis not present

## 2019-10-26 DIAGNOSIS — Z87440 Personal history of urinary (tract) infections: Secondary | ICD-10-CM | POA: Diagnosis not present

## 2019-10-26 DIAGNOSIS — Z4800 Encounter for change or removal of nonsurgical wound dressing: Secondary | ICD-10-CM | POA: Diagnosis not present

## 2019-10-26 DIAGNOSIS — C189 Malignant neoplasm of colon, unspecified: Secondary | ICD-10-CM | POA: Diagnosis not present

## 2019-10-26 DIAGNOSIS — I872 Venous insufficiency (chronic) (peripheral): Secondary | ICD-10-CM | POA: Diagnosis not present

## 2019-10-26 DIAGNOSIS — E1151 Type 2 diabetes mellitus with diabetic peripheral angiopathy without gangrene: Secondary | ICD-10-CM | POA: Diagnosis not present

## 2019-10-26 DIAGNOSIS — Z8616 Personal history of COVID-19: Secondary | ICD-10-CM | POA: Diagnosis not present

## 2019-10-26 DIAGNOSIS — I5033 Acute on chronic diastolic (congestive) heart failure: Secondary | ICD-10-CM | POA: Diagnosis not present

## 2019-10-26 DIAGNOSIS — K5792 Diverticulitis of intestine, part unspecified, without perforation or abscess without bleeding: Secondary | ICD-10-CM | POA: Diagnosis not present

## 2019-10-26 DIAGNOSIS — I110 Hypertensive heart disease with heart failure: Secondary | ICD-10-CM | POA: Diagnosis not present

## 2019-10-26 DIAGNOSIS — G8929 Other chronic pain: Secondary | ICD-10-CM | POA: Diagnosis not present

## 2019-10-26 DIAGNOSIS — L97219 Non-pressure chronic ulcer of right calf with unspecified severity: Secondary | ICD-10-CM | POA: Diagnosis not present

## 2019-10-27 DIAGNOSIS — G8929 Other chronic pain: Secondary | ICD-10-CM | POA: Diagnosis not present

## 2019-10-27 DIAGNOSIS — L97219 Non-pressure chronic ulcer of right calf with unspecified severity: Secondary | ICD-10-CM | POA: Diagnosis not present

## 2019-10-27 DIAGNOSIS — Z8616 Personal history of COVID-19: Secondary | ICD-10-CM | POA: Diagnosis not present

## 2019-10-27 DIAGNOSIS — K5792 Diverticulitis of intestine, part unspecified, without perforation or abscess without bleeding: Secondary | ICD-10-CM | POA: Diagnosis not present

## 2019-10-27 DIAGNOSIS — Z9181 History of falling: Secondary | ICD-10-CM | POA: Diagnosis not present

## 2019-10-27 DIAGNOSIS — Z794 Long term (current) use of insulin: Secondary | ICD-10-CM | POA: Diagnosis not present

## 2019-10-27 DIAGNOSIS — E1151 Type 2 diabetes mellitus with diabetic peripheral angiopathy without gangrene: Secondary | ICD-10-CM | POA: Diagnosis not present

## 2019-10-27 DIAGNOSIS — Z87440 Personal history of urinary (tract) infections: Secondary | ICD-10-CM | POA: Diagnosis not present

## 2019-10-27 DIAGNOSIS — Z4800 Encounter for change or removal of nonsurgical wound dressing: Secondary | ICD-10-CM | POA: Diagnosis not present

## 2019-10-27 DIAGNOSIS — I110 Hypertensive heart disease with heart failure: Secondary | ICD-10-CM | POA: Diagnosis not present

## 2019-10-27 DIAGNOSIS — C189 Malignant neoplasm of colon, unspecified: Secondary | ICD-10-CM | POA: Diagnosis not present

## 2019-10-27 DIAGNOSIS — I872 Venous insufficiency (chronic) (peripheral): Secondary | ICD-10-CM | POA: Diagnosis not present

## 2019-10-27 DIAGNOSIS — L400 Psoriasis vulgaris: Secondary | ICD-10-CM | POA: Diagnosis not present

## 2019-10-27 DIAGNOSIS — I5033 Acute on chronic diastolic (congestive) heart failure: Secondary | ICD-10-CM | POA: Diagnosis not present

## 2019-10-29 DIAGNOSIS — L97219 Non-pressure chronic ulcer of right calf with unspecified severity: Secondary | ICD-10-CM | POA: Diagnosis not present

## 2019-10-29 DIAGNOSIS — I872 Venous insufficiency (chronic) (peripheral): Secondary | ICD-10-CM | POA: Diagnosis not present

## 2019-10-31 DIAGNOSIS — I5033 Acute on chronic diastolic (congestive) heart failure: Secondary | ICD-10-CM | POA: Diagnosis not present

## 2019-10-31 DIAGNOSIS — I110 Hypertensive heart disease with heart failure: Secondary | ICD-10-CM | POA: Diagnosis not present

## 2019-10-31 DIAGNOSIS — L97219 Non-pressure chronic ulcer of right calf with unspecified severity: Secondary | ICD-10-CM | POA: Diagnosis not present

## 2019-10-31 DIAGNOSIS — Z9181 History of falling: Secondary | ICD-10-CM | POA: Diagnosis not present

## 2019-10-31 DIAGNOSIS — K5792 Diverticulitis of intestine, part unspecified, without perforation or abscess without bleeding: Secondary | ICD-10-CM | POA: Diagnosis not present

## 2019-10-31 DIAGNOSIS — G8929 Other chronic pain: Secondary | ICD-10-CM | POA: Diagnosis not present

## 2019-10-31 DIAGNOSIS — Z87440 Personal history of urinary (tract) infections: Secondary | ICD-10-CM | POA: Diagnosis not present

## 2019-10-31 DIAGNOSIS — L400 Psoriasis vulgaris: Secondary | ICD-10-CM | POA: Diagnosis not present

## 2019-10-31 DIAGNOSIS — Z8616 Personal history of COVID-19: Secondary | ICD-10-CM | POA: Diagnosis not present

## 2019-10-31 DIAGNOSIS — Z794 Long term (current) use of insulin: Secondary | ICD-10-CM | POA: Diagnosis not present

## 2019-10-31 DIAGNOSIS — C189 Malignant neoplasm of colon, unspecified: Secondary | ICD-10-CM | POA: Diagnosis not present

## 2019-10-31 DIAGNOSIS — I872 Venous insufficiency (chronic) (peripheral): Secondary | ICD-10-CM | POA: Diagnosis not present

## 2019-10-31 DIAGNOSIS — Z4800 Encounter for change or removal of nonsurgical wound dressing: Secondary | ICD-10-CM | POA: Diagnosis not present

## 2019-10-31 DIAGNOSIS — E1151 Type 2 diabetes mellitus with diabetic peripheral angiopathy without gangrene: Secondary | ICD-10-CM | POA: Diagnosis not present

## 2019-11-01 DIAGNOSIS — C189 Malignant neoplasm of colon, unspecified: Secondary | ICD-10-CM | POA: Diagnosis not present

## 2019-11-01 DIAGNOSIS — L400 Psoriasis vulgaris: Secondary | ICD-10-CM | POA: Diagnosis not present

## 2019-11-01 DIAGNOSIS — Z794 Long term (current) use of insulin: Secondary | ICD-10-CM | POA: Diagnosis not present

## 2019-11-01 DIAGNOSIS — I5033 Acute on chronic diastolic (congestive) heart failure: Secondary | ICD-10-CM | POA: Diagnosis not present

## 2019-11-01 DIAGNOSIS — I872 Venous insufficiency (chronic) (peripheral): Secondary | ICD-10-CM | POA: Diagnosis not present

## 2019-11-01 DIAGNOSIS — K5792 Diverticulitis of intestine, part unspecified, without perforation or abscess without bleeding: Secondary | ICD-10-CM | POA: Diagnosis not present

## 2019-11-01 DIAGNOSIS — L97219 Non-pressure chronic ulcer of right calf with unspecified severity: Secondary | ICD-10-CM | POA: Diagnosis not present

## 2019-11-01 DIAGNOSIS — Z9181 History of falling: Secondary | ICD-10-CM | POA: Diagnosis not present

## 2019-11-01 DIAGNOSIS — G8929 Other chronic pain: Secondary | ICD-10-CM | POA: Diagnosis not present

## 2019-11-01 DIAGNOSIS — I110 Hypertensive heart disease with heart failure: Secondary | ICD-10-CM | POA: Diagnosis not present

## 2019-11-01 DIAGNOSIS — Z4800 Encounter for change or removal of nonsurgical wound dressing: Secondary | ICD-10-CM | POA: Diagnosis not present

## 2019-11-01 DIAGNOSIS — Z87440 Personal history of urinary (tract) infections: Secondary | ICD-10-CM | POA: Diagnosis not present

## 2019-11-01 DIAGNOSIS — E1151 Type 2 diabetes mellitus with diabetic peripheral angiopathy without gangrene: Secondary | ICD-10-CM | POA: Diagnosis not present

## 2019-11-01 DIAGNOSIS — Z8616 Personal history of COVID-19: Secondary | ICD-10-CM | POA: Diagnosis not present

## 2019-11-03 DIAGNOSIS — Z8616 Personal history of COVID-19: Secondary | ICD-10-CM | POA: Diagnosis not present

## 2019-11-03 DIAGNOSIS — G8929 Other chronic pain: Secondary | ICD-10-CM | POA: Diagnosis not present

## 2019-11-03 DIAGNOSIS — K5792 Diverticulitis of intestine, part unspecified, without perforation or abscess without bleeding: Secondary | ICD-10-CM | POA: Diagnosis not present

## 2019-11-03 DIAGNOSIS — I110 Hypertensive heart disease with heart failure: Secondary | ICD-10-CM | POA: Diagnosis not present

## 2019-11-03 DIAGNOSIS — Z794 Long term (current) use of insulin: Secondary | ICD-10-CM | POA: Diagnosis not present

## 2019-11-03 DIAGNOSIS — Z87440 Personal history of urinary (tract) infections: Secondary | ICD-10-CM | POA: Diagnosis not present

## 2019-11-03 DIAGNOSIS — E1151 Type 2 diabetes mellitus with diabetic peripheral angiopathy without gangrene: Secondary | ICD-10-CM | POA: Diagnosis not present

## 2019-11-03 DIAGNOSIS — C189 Malignant neoplasm of colon, unspecified: Secondary | ICD-10-CM | POA: Diagnosis not present

## 2019-11-03 DIAGNOSIS — L400 Psoriasis vulgaris: Secondary | ICD-10-CM | POA: Diagnosis not present

## 2019-11-03 DIAGNOSIS — L97219 Non-pressure chronic ulcer of right calf with unspecified severity: Secondary | ICD-10-CM | POA: Diagnosis not present

## 2019-11-03 DIAGNOSIS — I872 Venous insufficiency (chronic) (peripheral): Secondary | ICD-10-CM | POA: Diagnosis not present

## 2019-11-03 DIAGNOSIS — I5033 Acute on chronic diastolic (congestive) heart failure: Secondary | ICD-10-CM | POA: Diagnosis not present

## 2019-11-03 DIAGNOSIS — Z9181 History of falling: Secondary | ICD-10-CM | POA: Diagnosis not present

## 2019-11-03 DIAGNOSIS — Z4800 Encounter for change or removal of nonsurgical wound dressing: Secondary | ICD-10-CM | POA: Diagnosis not present

## 2019-11-07 DIAGNOSIS — C189 Malignant neoplasm of colon, unspecified: Secondary | ICD-10-CM | POA: Diagnosis not present

## 2019-11-07 DIAGNOSIS — G8929 Other chronic pain: Secondary | ICD-10-CM | POA: Diagnosis not present

## 2019-11-07 DIAGNOSIS — I110 Hypertensive heart disease with heart failure: Secondary | ICD-10-CM | POA: Diagnosis not present

## 2019-11-07 DIAGNOSIS — Z8616 Personal history of COVID-19: Secondary | ICD-10-CM | POA: Diagnosis not present

## 2019-11-07 DIAGNOSIS — K5792 Diverticulitis of intestine, part unspecified, without perforation or abscess without bleeding: Secondary | ICD-10-CM | POA: Diagnosis not present

## 2019-11-07 DIAGNOSIS — Z794 Long term (current) use of insulin: Secondary | ICD-10-CM | POA: Diagnosis not present

## 2019-11-07 DIAGNOSIS — I872 Venous insufficiency (chronic) (peripheral): Secondary | ICD-10-CM | POA: Diagnosis not present

## 2019-11-07 DIAGNOSIS — L400 Psoriasis vulgaris: Secondary | ICD-10-CM | POA: Diagnosis not present

## 2019-11-07 DIAGNOSIS — E1151 Type 2 diabetes mellitus with diabetic peripheral angiopathy without gangrene: Secondary | ICD-10-CM | POA: Diagnosis not present

## 2019-11-07 DIAGNOSIS — Z87440 Personal history of urinary (tract) infections: Secondary | ICD-10-CM | POA: Diagnosis not present

## 2019-11-07 DIAGNOSIS — L97219 Non-pressure chronic ulcer of right calf with unspecified severity: Secondary | ICD-10-CM | POA: Diagnosis not present

## 2019-11-07 DIAGNOSIS — I5033 Acute on chronic diastolic (congestive) heart failure: Secondary | ICD-10-CM | POA: Diagnosis not present

## 2019-11-07 DIAGNOSIS — Z9181 History of falling: Secondary | ICD-10-CM | POA: Diagnosis not present

## 2019-11-07 DIAGNOSIS — Z4800 Encounter for change or removal of nonsurgical wound dressing: Secondary | ICD-10-CM | POA: Diagnosis not present

## 2019-11-08 DIAGNOSIS — K5792 Diverticulitis of intestine, part unspecified, without perforation or abscess without bleeding: Secondary | ICD-10-CM | POA: Diagnosis not present

## 2019-11-08 DIAGNOSIS — Z8616 Personal history of COVID-19: Secondary | ICD-10-CM | POA: Diagnosis not present

## 2019-11-08 DIAGNOSIS — Z87440 Personal history of urinary (tract) infections: Secondary | ICD-10-CM | POA: Diagnosis not present

## 2019-11-08 DIAGNOSIS — Z794 Long term (current) use of insulin: Secondary | ICD-10-CM | POA: Diagnosis not present

## 2019-11-08 DIAGNOSIS — Z9181 History of falling: Secondary | ICD-10-CM | POA: Diagnosis not present

## 2019-11-08 DIAGNOSIS — G8929 Other chronic pain: Secondary | ICD-10-CM | POA: Diagnosis not present

## 2019-11-08 DIAGNOSIS — L97219 Non-pressure chronic ulcer of right calf with unspecified severity: Secondary | ICD-10-CM | POA: Diagnosis not present

## 2019-11-08 DIAGNOSIS — C189 Malignant neoplasm of colon, unspecified: Secondary | ICD-10-CM | POA: Diagnosis not present

## 2019-11-08 DIAGNOSIS — L400 Psoriasis vulgaris: Secondary | ICD-10-CM | POA: Diagnosis not present

## 2019-11-08 DIAGNOSIS — E1151 Type 2 diabetes mellitus with diabetic peripheral angiopathy without gangrene: Secondary | ICD-10-CM | POA: Diagnosis not present

## 2019-11-08 DIAGNOSIS — I5033 Acute on chronic diastolic (congestive) heart failure: Secondary | ICD-10-CM | POA: Diagnosis not present

## 2019-11-08 DIAGNOSIS — I872 Venous insufficiency (chronic) (peripheral): Secondary | ICD-10-CM | POA: Diagnosis not present

## 2019-11-08 DIAGNOSIS — Z4800 Encounter for change or removal of nonsurgical wound dressing: Secondary | ICD-10-CM | POA: Diagnosis not present

## 2019-11-08 DIAGNOSIS — I110 Hypertensive heart disease with heart failure: Secondary | ICD-10-CM | POA: Diagnosis not present

## 2019-11-10 DIAGNOSIS — G8929 Other chronic pain: Secondary | ICD-10-CM | POA: Diagnosis not present

## 2019-11-10 DIAGNOSIS — K5792 Diverticulitis of intestine, part unspecified, without perforation or abscess without bleeding: Secondary | ICD-10-CM | POA: Diagnosis not present

## 2019-11-10 DIAGNOSIS — I872 Venous insufficiency (chronic) (peripheral): Secondary | ICD-10-CM | POA: Diagnosis not present

## 2019-11-10 DIAGNOSIS — L97219 Non-pressure chronic ulcer of right calf with unspecified severity: Secondary | ICD-10-CM | POA: Diagnosis not present

## 2019-11-10 DIAGNOSIS — Z8616 Personal history of COVID-19: Secondary | ICD-10-CM | POA: Diagnosis not present

## 2019-11-10 DIAGNOSIS — I110 Hypertensive heart disease with heart failure: Secondary | ICD-10-CM | POA: Diagnosis not present

## 2019-11-10 DIAGNOSIS — I5033 Acute on chronic diastolic (congestive) heart failure: Secondary | ICD-10-CM | POA: Diagnosis not present

## 2019-11-10 DIAGNOSIS — E1151 Type 2 diabetes mellitus with diabetic peripheral angiopathy without gangrene: Secondary | ICD-10-CM | POA: Diagnosis not present

## 2019-11-10 DIAGNOSIS — Z87440 Personal history of urinary (tract) infections: Secondary | ICD-10-CM | POA: Diagnosis not present

## 2019-11-10 DIAGNOSIS — L400 Psoriasis vulgaris: Secondary | ICD-10-CM | POA: Diagnosis not present

## 2019-11-10 DIAGNOSIS — Z9181 History of falling: Secondary | ICD-10-CM | POA: Diagnosis not present

## 2019-11-10 DIAGNOSIS — Z794 Long term (current) use of insulin: Secondary | ICD-10-CM | POA: Diagnosis not present

## 2019-11-10 DIAGNOSIS — Z4800 Encounter for change or removal of nonsurgical wound dressing: Secondary | ICD-10-CM | POA: Diagnosis not present

## 2019-11-10 DIAGNOSIS — C189 Malignant neoplasm of colon, unspecified: Secondary | ICD-10-CM | POA: Diagnosis not present

## 2019-11-14 DIAGNOSIS — Z9181 History of falling: Secondary | ICD-10-CM | POA: Diagnosis not present

## 2019-11-14 DIAGNOSIS — I872 Venous insufficiency (chronic) (peripheral): Secondary | ICD-10-CM | POA: Diagnosis not present

## 2019-11-14 DIAGNOSIS — Z794 Long term (current) use of insulin: Secondary | ICD-10-CM | POA: Diagnosis not present

## 2019-11-14 DIAGNOSIS — I5033 Acute on chronic diastolic (congestive) heart failure: Secondary | ICD-10-CM | POA: Diagnosis not present

## 2019-11-14 DIAGNOSIS — Z4800 Encounter for change or removal of nonsurgical wound dressing: Secondary | ICD-10-CM | POA: Diagnosis not present

## 2019-11-14 DIAGNOSIS — L400 Psoriasis vulgaris: Secondary | ICD-10-CM | POA: Diagnosis not present

## 2019-11-14 DIAGNOSIS — I10 Essential (primary) hypertension: Secondary | ICD-10-CM | POA: Diagnosis not present

## 2019-11-14 DIAGNOSIS — I110 Hypertensive heart disease with heart failure: Secondary | ICD-10-CM | POA: Diagnosis not present

## 2019-11-14 DIAGNOSIS — I5032 Chronic diastolic (congestive) heart failure: Secondary | ICD-10-CM | POA: Diagnosis not present

## 2019-11-14 DIAGNOSIS — E119 Type 2 diabetes mellitus without complications: Secondary | ICD-10-CM | POA: Diagnosis not present

## 2019-11-14 DIAGNOSIS — L97219 Non-pressure chronic ulcer of right calf with unspecified severity: Secondary | ICD-10-CM | POA: Diagnosis not present

## 2019-11-14 DIAGNOSIS — G8929 Other chronic pain: Secondary | ICD-10-CM | POA: Diagnosis not present

## 2019-11-14 DIAGNOSIS — K5792 Diverticulitis of intestine, part unspecified, without perforation or abscess without bleeding: Secondary | ICD-10-CM | POA: Diagnosis not present

## 2019-11-14 DIAGNOSIS — M1990 Unspecified osteoarthritis, unspecified site: Secondary | ICD-10-CM | POA: Diagnosis not present

## 2019-11-14 DIAGNOSIS — Z87440 Personal history of urinary (tract) infections: Secondary | ICD-10-CM | POA: Diagnosis not present

## 2019-11-14 DIAGNOSIS — Z8616 Personal history of COVID-19: Secondary | ICD-10-CM | POA: Diagnosis not present

## 2019-11-14 DIAGNOSIS — E1151 Type 2 diabetes mellitus with diabetic peripheral angiopathy without gangrene: Secondary | ICD-10-CM | POA: Diagnosis not present

## 2019-11-14 DIAGNOSIS — C189 Malignant neoplasm of colon, unspecified: Secondary | ICD-10-CM | POA: Diagnosis not present

## 2019-11-15 DIAGNOSIS — E1151 Type 2 diabetes mellitus with diabetic peripheral angiopathy without gangrene: Secondary | ICD-10-CM | POA: Diagnosis not present

## 2019-11-15 DIAGNOSIS — L400 Psoriasis vulgaris: Secondary | ICD-10-CM | POA: Diagnosis not present

## 2019-11-15 DIAGNOSIS — Z8616 Personal history of COVID-19: Secondary | ICD-10-CM | POA: Diagnosis not present

## 2019-11-15 DIAGNOSIS — K5792 Diverticulitis of intestine, part unspecified, without perforation or abscess without bleeding: Secondary | ICD-10-CM | POA: Diagnosis not present

## 2019-11-15 DIAGNOSIS — Z9181 History of falling: Secondary | ICD-10-CM | POA: Diagnosis not present

## 2019-11-15 DIAGNOSIS — I5033 Acute on chronic diastolic (congestive) heart failure: Secondary | ICD-10-CM | POA: Diagnosis not present

## 2019-11-15 DIAGNOSIS — Z4800 Encounter for change or removal of nonsurgical wound dressing: Secondary | ICD-10-CM | POA: Diagnosis not present

## 2019-11-15 DIAGNOSIS — Z87440 Personal history of urinary (tract) infections: Secondary | ICD-10-CM | POA: Diagnosis not present

## 2019-11-15 DIAGNOSIS — G8929 Other chronic pain: Secondary | ICD-10-CM | POA: Diagnosis not present

## 2019-11-15 DIAGNOSIS — I872 Venous insufficiency (chronic) (peripheral): Secondary | ICD-10-CM | POA: Diagnosis not present

## 2019-11-15 DIAGNOSIS — Z794 Long term (current) use of insulin: Secondary | ICD-10-CM | POA: Diagnosis not present

## 2019-11-15 DIAGNOSIS — I110 Hypertensive heart disease with heart failure: Secondary | ICD-10-CM | POA: Diagnosis not present

## 2019-11-15 DIAGNOSIS — C189 Malignant neoplasm of colon, unspecified: Secondary | ICD-10-CM | POA: Diagnosis not present

## 2019-11-15 DIAGNOSIS — L97219 Non-pressure chronic ulcer of right calf with unspecified severity: Secondary | ICD-10-CM | POA: Diagnosis not present

## 2019-11-17 DIAGNOSIS — I5033 Acute on chronic diastolic (congestive) heart failure: Secondary | ICD-10-CM | POA: Diagnosis not present

## 2019-11-17 DIAGNOSIS — Z9181 History of falling: Secondary | ICD-10-CM | POA: Diagnosis not present

## 2019-11-17 DIAGNOSIS — L400 Psoriasis vulgaris: Secondary | ICD-10-CM | POA: Diagnosis not present

## 2019-11-17 DIAGNOSIS — Z87440 Personal history of urinary (tract) infections: Secondary | ICD-10-CM | POA: Diagnosis not present

## 2019-11-17 DIAGNOSIS — I872 Venous insufficiency (chronic) (peripheral): Secondary | ICD-10-CM | POA: Diagnosis not present

## 2019-11-17 DIAGNOSIS — E1151 Type 2 diabetes mellitus with diabetic peripheral angiopathy without gangrene: Secondary | ICD-10-CM | POA: Diagnosis not present

## 2019-11-17 DIAGNOSIS — Z794 Long term (current) use of insulin: Secondary | ICD-10-CM | POA: Diagnosis not present

## 2019-11-17 DIAGNOSIS — K5792 Diverticulitis of intestine, part unspecified, without perforation or abscess without bleeding: Secondary | ICD-10-CM | POA: Diagnosis not present

## 2019-11-17 DIAGNOSIS — I110 Hypertensive heart disease with heart failure: Secondary | ICD-10-CM | POA: Diagnosis not present

## 2019-11-17 DIAGNOSIS — Z4800 Encounter for change or removal of nonsurgical wound dressing: Secondary | ICD-10-CM | POA: Diagnosis not present

## 2019-11-17 DIAGNOSIS — C189 Malignant neoplasm of colon, unspecified: Secondary | ICD-10-CM | POA: Diagnosis not present

## 2019-11-17 DIAGNOSIS — L97219 Non-pressure chronic ulcer of right calf with unspecified severity: Secondary | ICD-10-CM | POA: Diagnosis not present

## 2019-11-17 DIAGNOSIS — Z8616 Personal history of COVID-19: Secondary | ICD-10-CM | POA: Diagnosis not present

## 2019-11-17 DIAGNOSIS — G8929 Other chronic pain: Secondary | ICD-10-CM | POA: Diagnosis not present

## 2019-11-18 DIAGNOSIS — E7849 Other hyperlipidemia: Secondary | ICD-10-CM | POA: Diagnosis not present

## 2019-11-18 DIAGNOSIS — E038 Other specified hypothyroidism: Secondary | ICD-10-CM | POA: Diagnosis not present

## 2019-11-18 DIAGNOSIS — U071 COVID-19: Secondary | ICD-10-CM | POA: Diagnosis not present

## 2019-11-18 DIAGNOSIS — D518 Other vitamin B12 deficiency anemias: Secondary | ICD-10-CM | POA: Diagnosis not present

## 2019-11-18 DIAGNOSIS — Z79899 Other long term (current) drug therapy: Secondary | ICD-10-CM | POA: Diagnosis not present

## 2019-11-18 DIAGNOSIS — E119 Type 2 diabetes mellitus without complications: Secondary | ICD-10-CM | POA: Diagnosis not present

## 2019-11-18 DIAGNOSIS — E559 Vitamin D deficiency, unspecified: Secondary | ICD-10-CM | POA: Diagnosis not present

## 2019-11-21 DIAGNOSIS — Z8616 Personal history of COVID-19: Secondary | ICD-10-CM | POA: Diagnosis not present

## 2019-11-21 DIAGNOSIS — I872 Venous insufficiency (chronic) (peripheral): Secondary | ICD-10-CM | POA: Diagnosis not present

## 2019-11-21 DIAGNOSIS — L97219 Non-pressure chronic ulcer of right calf with unspecified severity: Secondary | ICD-10-CM | POA: Diagnosis not present

## 2019-11-21 DIAGNOSIS — E1151 Type 2 diabetes mellitus with diabetic peripheral angiopathy without gangrene: Secondary | ICD-10-CM | POA: Diagnosis not present

## 2019-11-21 DIAGNOSIS — I110 Hypertensive heart disease with heart failure: Secondary | ICD-10-CM | POA: Diagnosis not present

## 2019-11-21 DIAGNOSIS — Z794 Long term (current) use of insulin: Secondary | ICD-10-CM | POA: Diagnosis not present

## 2019-11-21 DIAGNOSIS — K5792 Diverticulitis of intestine, part unspecified, without perforation or abscess without bleeding: Secondary | ICD-10-CM | POA: Diagnosis not present

## 2019-11-21 DIAGNOSIS — Z4800 Encounter for change or removal of nonsurgical wound dressing: Secondary | ICD-10-CM | POA: Diagnosis not present

## 2019-11-21 DIAGNOSIS — L400 Psoriasis vulgaris: Secondary | ICD-10-CM | POA: Diagnosis not present

## 2019-11-21 DIAGNOSIS — Z9181 History of falling: Secondary | ICD-10-CM | POA: Diagnosis not present

## 2019-11-21 DIAGNOSIS — Z87440 Personal history of urinary (tract) infections: Secondary | ICD-10-CM | POA: Diagnosis not present

## 2019-11-21 DIAGNOSIS — I5033 Acute on chronic diastolic (congestive) heart failure: Secondary | ICD-10-CM | POA: Diagnosis not present

## 2019-11-21 DIAGNOSIS — C189 Malignant neoplasm of colon, unspecified: Secondary | ICD-10-CM | POA: Diagnosis not present

## 2019-11-21 DIAGNOSIS — G8929 Other chronic pain: Secondary | ICD-10-CM | POA: Diagnosis not present

## 2019-11-21 DIAGNOSIS — L309 Dermatitis, unspecified: Secondary | ICD-10-CM | POA: Diagnosis not present

## 2019-11-22 DIAGNOSIS — I110 Hypertensive heart disease with heart failure: Secondary | ICD-10-CM | POA: Diagnosis not present

## 2019-11-22 DIAGNOSIS — K5792 Diverticulitis of intestine, part unspecified, without perforation or abscess without bleeding: Secondary | ICD-10-CM | POA: Diagnosis not present

## 2019-11-22 DIAGNOSIS — L400 Psoriasis vulgaris: Secondary | ICD-10-CM | POA: Diagnosis not present

## 2019-11-22 DIAGNOSIS — I5033 Acute on chronic diastolic (congestive) heart failure: Secondary | ICD-10-CM | POA: Diagnosis not present

## 2019-11-22 DIAGNOSIS — I872 Venous insufficiency (chronic) (peripheral): Secondary | ICD-10-CM | POA: Diagnosis not present

## 2019-11-22 DIAGNOSIS — Z4800 Encounter for change or removal of nonsurgical wound dressing: Secondary | ICD-10-CM | POA: Diagnosis not present

## 2019-11-22 DIAGNOSIS — E1151 Type 2 diabetes mellitus with diabetic peripheral angiopathy without gangrene: Secondary | ICD-10-CM | POA: Diagnosis not present

## 2019-11-22 DIAGNOSIS — C189 Malignant neoplasm of colon, unspecified: Secondary | ICD-10-CM | POA: Diagnosis not present

## 2019-11-22 DIAGNOSIS — G8929 Other chronic pain: Secondary | ICD-10-CM | POA: Diagnosis not present

## 2019-11-22 DIAGNOSIS — Z8616 Personal history of COVID-19: Secondary | ICD-10-CM | POA: Diagnosis not present

## 2019-11-22 DIAGNOSIS — Z794 Long term (current) use of insulin: Secondary | ICD-10-CM | POA: Diagnosis not present

## 2019-11-22 DIAGNOSIS — Z9181 History of falling: Secondary | ICD-10-CM | POA: Diagnosis not present

## 2019-11-22 DIAGNOSIS — L97219 Non-pressure chronic ulcer of right calf with unspecified severity: Secondary | ICD-10-CM | POA: Diagnosis not present

## 2019-11-22 DIAGNOSIS — Z87440 Personal history of urinary (tract) infections: Secondary | ICD-10-CM | POA: Diagnosis not present

## 2019-11-24 DIAGNOSIS — L97219 Non-pressure chronic ulcer of right calf with unspecified severity: Secondary | ICD-10-CM | POA: Diagnosis not present

## 2019-11-24 DIAGNOSIS — Z9181 History of falling: Secondary | ICD-10-CM | POA: Diagnosis not present

## 2019-11-24 DIAGNOSIS — Z4800 Encounter for change or removal of nonsurgical wound dressing: Secondary | ICD-10-CM | POA: Diagnosis not present

## 2019-11-24 DIAGNOSIS — I5033 Acute on chronic diastolic (congestive) heart failure: Secondary | ICD-10-CM | POA: Diagnosis not present

## 2019-11-24 DIAGNOSIS — G8929 Other chronic pain: Secondary | ICD-10-CM | POA: Diagnosis not present

## 2019-11-24 DIAGNOSIS — C189 Malignant neoplasm of colon, unspecified: Secondary | ICD-10-CM | POA: Diagnosis not present

## 2019-11-24 DIAGNOSIS — K5792 Diverticulitis of intestine, part unspecified, without perforation or abscess without bleeding: Secondary | ICD-10-CM | POA: Diagnosis not present

## 2019-11-24 DIAGNOSIS — Z8616 Personal history of COVID-19: Secondary | ICD-10-CM | POA: Diagnosis not present

## 2019-11-24 DIAGNOSIS — I110 Hypertensive heart disease with heart failure: Secondary | ICD-10-CM | POA: Diagnosis not present

## 2019-11-24 DIAGNOSIS — L400 Psoriasis vulgaris: Secondary | ICD-10-CM | POA: Diagnosis not present

## 2019-11-24 DIAGNOSIS — Z794 Long term (current) use of insulin: Secondary | ICD-10-CM | POA: Diagnosis not present

## 2019-11-24 DIAGNOSIS — I872 Venous insufficiency (chronic) (peripheral): Secondary | ICD-10-CM | POA: Diagnosis not present

## 2019-11-24 DIAGNOSIS — E1151 Type 2 diabetes mellitus with diabetic peripheral angiopathy without gangrene: Secondary | ICD-10-CM | POA: Diagnosis not present

## 2019-11-24 DIAGNOSIS — Z87440 Personal history of urinary (tract) infections: Secondary | ICD-10-CM | POA: Diagnosis not present

## 2019-11-28 DIAGNOSIS — L400 Psoriasis vulgaris: Secondary | ICD-10-CM | POA: Diagnosis not present

## 2019-11-28 DIAGNOSIS — Z9181 History of falling: Secondary | ICD-10-CM | POA: Diagnosis not present

## 2019-11-28 DIAGNOSIS — Z87440 Personal history of urinary (tract) infections: Secondary | ICD-10-CM | POA: Diagnosis not present

## 2019-11-28 DIAGNOSIS — E1151 Type 2 diabetes mellitus with diabetic peripheral angiopathy without gangrene: Secondary | ICD-10-CM | POA: Diagnosis not present

## 2019-11-28 DIAGNOSIS — L97219 Non-pressure chronic ulcer of right calf with unspecified severity: Secondary | ICD-10-CM | POA: Diagnosis not present

## 2019-11-28 DIAGNOSIS — C189 Malignant neoplasm of colon, unspecified: Secondary | ICD-10-CM | POA: Diagnosis not present

## 2019-11-28 DIAGNOSIS — I110 Hypertensive heart disease with heart failure: Secondary | ICD-10-CM | POA: Diagnosis not present

## 2019-11-28 DIAGNOSIS — K5792 Diverticulitis of intestine, part unspecified, without perforation or abscess without bleeding: Secondary | ICD-10-CM | POA: Diagnosis not present

## 2019-11-28 DIAGNOSIS — Z8616 Personal history of COVID-19: Secondary | ICD-10-CM | POA: Diagnosis not present

## 2019-11-28 DIAGNOSIS — I5033 Acute on chronic diastolic (congestive) heart failure: Secondary | ICD-10-CM | POA: Diagnosis not present

## 2019-11-28 DIAGNOSIS — I872 Venous insufficiency (chronic) (peripheral): Secondary | ICD-10-CM | POA: Diagnosis not present

## 2019-11-28 DIAGNOSIS — Z4800 Encounter for change or removal of nonsurgical wound dressing: Secondary | ICD-10-CM | POA: Diagnosis not present

## 2019-11-28 DIAGNOSIS — G8929 Other chronic pain: Secondary | ICD-10-CM | POA: Diagnosis not present

## 2019-11-28 DIAGNOSIS — Z794 Long term (current) use of insulin: Secondary | ICD-10-CM | POA: Diagnosis not present

## 2019-12-01 DIAGNOSIS — L400 Psoriasis vulgaris: Secondary | ICD-10-CM | POA: Diagnosis not present

## 2019-12-01 DIAGNOSIS — Z87440 Personal history of urinary (tract) infections: Secondary | ICD-10-CM | POA: Diagnosis not present

## 2019-12-01 DIAGNOSIS — G8929 Other chronic pain: Secondary | ICD-10-CM | POA: Diagnosis not present

## 2019-12-01 DIAGNOSIS — I110 Hypertensive heart disease with heart failure: Secondary | ICD-10-CM | POA: Diagnosis not present

## 2019-12-01 DIAGNOSIS — Z4800 Encounter for change or removal of nonsurgical wound dressing: Secondary | ICD-10-CM | POA: Diagnosis not present

## 2019-12-01 DIAGNOSIS — L97219 Non-pressure chronic ulcer of right calf with unspecified severity: Secondary | ICD-10-CM | POA: Diagnosis not present

## 2019-12-01 DIAGNOSIS — I5033 Acute on chronic diastolic (congestive) heart failure: Secondary | ICD-10-CM | POA: Diagnosis not present

## 2019-12-01 DIAGNOSIS — Z9181 History of falling: Secondary | ICD-10-CM | POA: Diagnosis not present

## 2019-12-01 DIAGNOSIS — K5792 Diverticulitis of intestine, part unspecified, without perforation or abscess without bleeding: Secondary | ICD-10-CM | POA: Diagnosis not present

## 2019-12-01 DIAGNOSIS — I872 Venous insufficiency (chronic) (peripheral): Secondary | ICD-10-CM | POA: Diagnosis not present

## 2019-12-01 DIAGNOSIS — E1151 Type 2 diabetes mellitus with diabetic peripheral angiopathy without gangrene: Secondary | ICD-10-CM | POA: Diagnosis not present

## 2019-12-01 DIAGNOSIS — Z8616 Personal history of COVID-19: Secondary | ICD-10-CM | POA: Diagnosis not present

## 2019-12-01 DIAGNOSIS — Z794 Long term (current) use of insulin: Secondary | ICD-10-CM | POA: Diagnosis not present

## 2019-12-01 DIAGNOSIS — C189 Malignant neoplasm of colon, unspecified: Secondary | ICD-10-CM | POA: Diagnosis not present

## 2019-12-04 DIAGNOSIS — G8929 Other chronic pain: Secondary | ICD-10-CM | POA: Diagnosis not present

## 2019-12-04 DIAGNOSIS — E1151 Type 2 diabetes mellitus with diabetic peripheral angiopathy without gangrene: Secondary | ICD-10-CM | POA: Diagnosis not present

## 2019-12-04 DIAGNOSIS — Z87440 Personal history of urinary (tract) infections: Secondary | ICD-10-CM | POA: Diagnosis not present

## 2019-12-04 DIAGNOSIS — L97219 Non-pressure chronic ulcer of right calf with unspecified severity: Secondary | ICD-10-CM | POA: Diagnosis not present

## 2019-12-04 DIAGNOSIS — I872 Venous insufficiency (chronic) (peripheral): Secondary | ICD-10-CM | POA: Diagnosis not present

## 2019-12-04 DIAGNOSIS — L400 Psoriasis vulgaris: Secondary | ICD-10-CM | POA: Diagnosis not present

## 2019-12-04 DIAGNOSIS — C189 Malignant neoplasm of colon, unspecified: Secondary | ICD-10-CM | POA: Diagnosis not present

## 2019-12-04 DIAGNOSIS — Z8616 Personal history of COVID-19: Secondary | ICD-10-CM | POA: Diagnosis not present

## 2019-12-04 DIAGNOSIS — Z794 Long term (current) use of insulin: Secondary | ICD-10-CM | POA: Diagnosis not present

## 2019-12-04 DIAGNOSIS — Z9181 History of falling: Secondary | ICD-10-CM | POA: Diagnosis not present

## 2019-12-04 DIAGNOSIS — Z4800 Encounter for change or removal of nonsurgical wound dressing: Secondary | ICD-10-CM | POA: Diagnosis not present

## 2019-12-04 DIAGNOSIS — K5792 Diverticulitis of intestine, part unspecified, without perforation or abscess without bleeding: Secondary | ICD-10-CM | POA: Diagnosis not present

## 2019-12-04 DIAGNOSIS — I5033 Acute on chronic diastolic (congestive) heart failure: Secondary | ICD-10-CM | POA: Diagnosis not present

## 2019-12-04 DIAGNOSIS — I110 Hypertensive heart disease with heart failure: Secondary | ICD-10-CM | POA: Diagnosis not present

## 2019-12-08 DIAGNOSIS — Z794 Long term (current) use of insulin: Secondary | ICD-10-CM | POA: Diagnosis not present

## 2019-12-08 DIAGNOSIS — Z87440 Personal history of urinary (tract) infections: Secondary | ICD-10-CM | POA: Diagnosis not present

## 2019-12-08 DIAGNOSIS — K5792 Diverticulitis of intestine, part unspecified, without perforation or abscess without bleeding: Secondary | ICD-10-CM | POA: Diagnosis not present

## 2019-12-08 DIAGNOSIS — E1151 Type 2 diabetes mellitus with diabetic peripheral angiopathy without gangrene: Secondary | ICD-10-CM | POA: Diagnosis not present

## 2019-12-08 DIAGNOSIS — Z9181 History of falling: Secondary | ICD-10-CM | POA: Diagnosis not present

## 2019-12-08 DIAGNOSIS — Z8616 Personal history of COVID-19: Secondary | ICD-10-CM | POA: Diagnosis not present

## 2019-12-08 DIAGNOSIS — I5033 Acute on chronic diastolic (congestive) heart failure: Secondary | ICD-10-CM | POA: Diagnosis not present

## 2019-12-08 DIAGNOSIS — L97219 Non-pressure chronic ulcer of right calf with unspecified severity: Secondary | ICD-10-CM | POA: Diagnosis not present

## 2019-12-08 DIAGNOSIS — Z4800 Encounter for change or removal of nonsurgical wound dressing: Secondary | ICD-10-CM | POA: Diagnosis not present

## 2019-12-08 DIAGNOSIS — L400 Psoriasis vulgaris: Secondary | ICD-10-CM | POA: Diagnosis not present

## 2019-12-08 DIAGNOSIS — G8929 Other chronic pain: Secondary | ICD-10-CM | POA: Diagnosis not present

## 2019-12-08 DIAGNOSIS — C189 Malignant neoplasm of colon, unspecified: Secondary | ICD-10-CM | POA: Diagnosis not present

## 2019-12-08 DIAGNOSIS — I110 Hypertensive heart disease with heart failure: Secondary | ICD-10-CM | POA: Diagnosis not present

## 2019-12-08 DIAGNOSIS — I872 Venous insufficiency (chronic) (peripheral): Secondary | ICD-10-CM | POA: Diagnosis not present

## 2019-12-09 DIAGNOSIS — I10 Essential (primary) hypertension: Secondary | ICD-10-CM | POA: Diagnosis not present

## 2019-12-09 DIAGNOSIS — K5900 Constipation, unspecified: Secondary | ICD-10-CM | POA: Diagnosis not present

## 2019-12-09 DIAGNOSIS — E119 Type 2 diabetes mellitus without complications: Secondary | ICD-10-CM | POA: Diagnosis not present

## 2019-12-09 DIAGNOSIS — I5032 Chronic diastolic (congestive) heart failure: Secondary | ICD-10-CM | POA: Diagnosis not present

## 2019-12-13 DIAGNOSIS — Z9181 History of falling: Secondary | ICD-10-CM | POA: Diagnosis not present

## 2019-12-13 DIAGNOSIS — C189 Malignant neoplasm of colon, unspecified: Secondary | ICD-10-CM | POA: Diagnosis not present

## 2019-12-13 DIAGNOSIS — K5792 Diverticulitis of intestine, part unspecified, without perforation or abscess without bleeding: Secondary | ICD-10-CM | POA: Diagnosis not present

## 2019-12-13 DIAGNOSIS — I110 Hypertensive heart disease with heart failure: Secondary | ICD-10-CM | POA: Diagnosis not present

## 2019-12-13 DIAGNOSIS — Z794 Long term (current) use of insulin: Secondary | ICD-10-CM | POA: Diagnosis not present

## 2019-12-13 DIAGNOSIS — Z87440 Personal history of urinary (tract) infections: Secondary | ICD-10-CM | POA: Diagnosis not present

## 2019-12-13 DIAGNOSIS — I872 Venous insufficiency (chronic) (peripheral): Secondary | ICD-10-CM | POA: Diagnosis not present

## 2019-12-13 DIAGNOSIS — L97219 Non-pressure chronic ulcer of right calf with unspecified severity: Secondary | ICD-10-CM | POA: Diagnosis not present

## 2019-12-13 DIAGNOSIS — L400 Psoriasis vulgaris: Secondary | ICD-10-CM | POA: Diagnosis not present

## 2019-12-13 DIAGNOSIS — E1151 Type 2 diabetes mellitus with diabetic peripheral angiopathy without gangrene: Secondary | ICD-10-CM | POA: Diagnosis not present

## 2019-12-13 DIAGNOSIS — Z4800 Encounter for change or removal of nonsurgical wound dressing: Secondary | ICD-10-CM | POA: Diagnosis not present

## 2019-12-13 DIAGNOSIS — I5033 Acute on chronic diastolic (congestive) heart failure: Secondary | ICD-10-CM | POA: Diagnosis not present

## 2019-12-13 DIAGNOSIS — G8929 Other chronic pain: Secondary | ICD-10-CM | POA: Diagnosis not present

## 2019-12-16 DIAGNOSIS — K5792 Diverticulitis of intestine, part unspecified, without perforation or abscess without bleeding: Secondary | ICD-10-CM | POA: Diagnosis not present

## 2019-12-16 DIAGNOSIS — Z4800 Encounter for change or removal of nonsurgical wound dressing: Secondary | ICD-10-CM | POA: Diagnosis not present

## 2019-12-16 DIAGNOSIS — E1151 Type 2 diabetes mellitus with diabetic peripheral angiopathy without gangrene: Secondary | ICD-10-CM | POA: Diagnosis not present

## 2019-12-16 DIAGNOSIS — Z9181 History of falling: Secondary | ICD-10-CM | POA: Diagnosis not present

## 2019-12-16 DIAGNOSIS — I5033 Acute on chronic diastolic (congestive) heart failure: Secondary | ICD-10-CM | POA: Diagnosis not present

## 2019-12-16 DIAGNOSIS — L97219 Non-pressure chronic ulcer of right calf with unspecified severity: Secondary | ICD-10-CM | POA: Diagnosis not present

## 2019-12-16 DIAGNOSIS — Z794 Long term (current) use of insulin: Secondary | ICD-10-CM | POA: Diagnosis not present

## 2019-12-16 DIAGNOSIS — C189 Malignant neoplasm of colon, unspecified: Secondary | ICD-10-CM | POA: Diagnosis not present

## 2019-12-16 DIAGNOSIS — I872 Venous insufficiency (chronic) (peripheral): Secondary | ICD-10-CM | POA: Diagnosis not present

## 2019-12-16 DIAGNOSIS — L400 Psoriasis vulgaris: Secondary | ICD-10-CM | POA: Diagnosis not present

## 2019-12-16 DIAGNOSIS — Z87440 Personal history of urinary (tract) infections: Secondary | ICD-10-CM | POA: Diagnosis not present

## 2019-12-16 DIAGNOSIS — I110 Hypertensive heart disease with heart failure: Secondary | ICD-10-CM | POA: Diagnosis not present

## 2019-12-16 DIAGNOSIS — G8929 Other chronic pain: Secondary | ICD-10-CM | POA: Diagnosis not present

## 2019-12-19 DIAGNOSIS — L400 Psoriasis vulgaris: Secondary | ICD-10-CM | POA: Diagnosis not present

## 2019-12-19 DIAGNOSIS — G8929 Other chronic pain: Secondary | ICD-10-CM | POA: Diagnosis not present

## 2019-12-19 DIAGNOSIS — Z87440 Personal history of urinary (tract) infections: Secondary | ICD-10-CM | POA: Diagnosis not present

## 2019-12-19 DIAGNOSIS — E1151 Type 2 diabetes mellitus with diabetic peripheral angiopathy without gangrene: Secondary | ICD-10-CM | POA: Diagnosis not present

## 2019-12-19 DIAGNOSIS — Z9181 History of falling: Secondary | ICD-10-CM | POA: Diagnosis not present

## 2019-12-19 DIAGNOSIS — L97219 Non-pressure chronic ulcer of right calf with unspecified severity: Secondary | ICD-10-CM | POA: Diagnosis not present

## 2019-12-19 DIAGNOSIS — I110 Hypertensive heart disease with heart failure: Secondary | ICD-10-CM | POA: Diagnosis not present

## 2019-12-19 DIAGNOSIS — I5033 Acute on chronic diastolic (congestive) heart failure: Secondary | ICD-10-CM | POA: Diagnosis not present

## 2019-12-19 DIAGNOSIS — Z4800 Encounter for change or removal of nonsurgical wound dressing: Secondary | ICD-10-CM | POA: Diagnosis not present

## 2019-12-19 DIAGNOSIS — K5792 Diverticulitis of intestine, part unspecified, without perforation or abscess without bleeding: Secondary | ICD-10-CM | POA: Diagnosis not present

## 2019-12-19 DIAGNOSIS — Z794 Long term (current) use of insulin: Secondary | ICD-10-CM | POA: Diagnosis not present

## 2019-12-19 DIAGNOSIS — C189 Malignant neoplasm of colon, unspecified: Secondary | ICD-10-CM | POA: Diagnosis not present

## 2019-12-19 DIAGNOSIS — I872 Venous insufficiency (chronic) (peripheral): Secondary | ICD-10-CM | POA: Diagnosis not present

## 2019-12-21 DIAGNOSIS — D518 Other vitamin B12 deficiency anemias: Secondary | ICD-10-CM | POA: Diagnosis not present

## 2019-12-21 DIAGNOSIS — Z79899 Other long term (current) drug therapy: Secondary | ICD-10-CM | POA: Diagnosis not present

## 2019-12-21 DIAGNOSIS — E119 Type 2 diabetes mellitus without complications: Secondary | ICD-10-CM | POA: Diagnosis not present

## 2019-12-21 DIAGNOSIS — E559 Vitamin D deficiency, unspecified: Secondary | ICD-10-CM | POA: Diagnosis not present

## 2019-12-21 DIAGNOSIS — E038 Other specified hypothyroidism: Secondary | ICD-10-CM | POA: Diagnosis not present

## 2019-12-21 DIAGNOSIS — E7849 Other hyperlipidemia: Secondary | ICD-10-CM | POA: Diagnosis not present

## 2019-12-22 DIAGNOSIS — K5792 Diverticulitis of intestine, part unspecified, without perforation or abscess without bleeding: Secondary | ICD-10-CM | POA: Diagnosis not present

## 2019-12-22 DIAGNOSIS — C189 Malignant neoplasm of colon, unspecified: Secondary | ICD-10-CM | POA: Diagnosis not present

## 2019-12-22 DIAGNOSIS — I110 Hypertensive heart disease with heart failure: Secondary | ICD-10-CM | POA: Diagnosis not present

## 2019-12-22 DIAGNOSIS — I872 Venous insufficiency (chronic) (peripheral): Secondary | ICD-10-CM | POA: Diagnosis not present

## 2019-12-22 DIAGNOSIS — Z4800 Encounter for change or removal of nonsurgical wound dressing: Secondary | ICD-10-CM | POA: Diagnosis not present

## 2019-12-22 DIAGNOSIS — L400 Psoriasis vulgaris: Secondary | ICD-10-CM | POA: Diagnosis not present

## 2019-12-22 DIAGNOSIS — E1151 Type 2 diabetes mellitus with diabetic peripheral angiopathy without gangrene: Secondary | ICD-10-CM | POA: Diagnosis not present

## 2019-12-22 DIAGNOSIS — Z794 Long term (current) use of insulin: Secondary | ICD-10-CM | POA: Diagnosis not present

## 2019-12-22 DIAGNOSIS — L97219 Non-pressure chronic ulcer of right calf with unspecified severity: Secondary | ICD-10-CM | POA: Diagnosis not present

## 2019-12-22 DIAGNOSIS — Z9181 History of falling: Secondary | ICD-10-CM | POA: Diagnosis not present

## 2019-12-22 DIAGNOSIS — G8929 Other chronic pain: Secondary | ICD-10-CM | POA: Diagnosis not present

## 2019-12-22 DIAGNOSIS — Z87440 Personal history of urinary (tract) infections: Secondary | ICD-10-CM | POA: Diagnosis not present

## 2019-12-22 DIAGNOSIS — I5033 Acute on chronic diastolic (congestive) heart failure: Secondary | ICD-10-CM | POA: Diagnosis not present

## 2019-12-26 DIAGNOSIS — I872 Venous insufficiency (chronic) (peripheral): Secondary | ICD-10-CM | POA: Diagnosis not present

## 2019-12-26 DIAGNOSIS — L97219 Non-pressure chronic ulcer of right calf with unspecified severity: Secondary | ICD-10-CM | POA: Diagnosis not present

## 2019-12-26 DIAGNOSIS — E1151 Type 2 diabetes mellitus with diabetic peripheral angiopathy without gangrene: Secondary | ICD-10-CM | POA: Diagnosis not present

## 2019-12-26 DIAGNOSIS — I110 Hypertensive heart disease with heart failure: Secondary | ICD-10-CM | POA: Diagnosis not present

## 2019-12-26 DIAGNOSIS — C189 Malignant neoplasm of colon, unspecified: Secondary | ICD-10-CM | POA: Diagnosis not present

## 2019-12-26 DIAGNOSIS — Z9181 History of falling: Secondary | ICD-10-CM | POA: Diagnosis not present

## 2019-12-26 DIAGNOSIS — K5792 Diverticulitis of intestine, part unspecified, without perforation or abscess without bleeding: Secondary | ICD-10-CM | POA: Diagnosis not present

## 2019-12-26 DIAGNOSIS — I5033 Acute on chronic diastolic (congestive) heart failure: Secondary | ICD-10-CM | POA: Diagnosis not present

## 2019-12-26 DIAGNOSIS — Z4800 Encounter for change or removal of nonsurgical wound dressing: Secondary | ICD-10-CM | POA: Diagnosis not present

## 2019-12-26 DIAGNOSIS — G8929 Other chronic pain: Secondary | ICD-10-CM | POA: Diagnosis not present

## 2019-12-26 DIAGNOSIS — Z794 Long term (current) use of insulin: Secondary | ICD-10-CM | POA: Diagnosis not present

## 2019-12-26 DIAGNOSIS — L400 Psoriasis vulgaris: Secondary | ICD-10-CM | POA: Diagnosis not present

## 2019-12-26 DIAGNOSIS — Z87440 Personal history of urinary (tract) infections: Secondary | ICD-10-CM | POA: Diagnosis not present

## 2019-12-27 DIAGNOSIS — I5033 Acute on chronic diastolic (congestive) heart failure: Secondary | ICD-10-CM | POA: Diagnosis not present

## 2019-12-27 DIAGNOSIS — I872 Venous insufficiency (chronic) (peripheral): Secondary | ICD-10-CM | POA: Diagnosis not present

## 2019-12-27 DIAGNOSIS — E1151 Type 2 diabetes mellitus with diabetic peripheral angiopathy without gangrene: Secondary | ICD-10-CM | POA: Diagnosis not present

## 2019-12-27 DIAGNOSIS — I110 Hypertensive heart disease with heart failure: Secondary | ICD-10-CM | POA: Diagnosis not present

## 2019-12-29 DIAGNOSIS — L400 Psoriasis vulgaris: Secondary | ICD-10-CM | POA: Diagnosis not present

## 2019-12-29 DIAGNOSIS — I872 Venous insufficiency (chronic) (peripheral): Secondary | ICD-10-CM | POA: Diagnosis not present

## 2019-12-29 DIAGNOSIS — Z87440 Personal history of urinary (tract) infections: Secondary | ICD-10-CM | POA: Diagnosis not present

## 2019-12-29 DIAGNOSIS — Z794 Long term (current) use of insulin: Secondary | ICD-10-CM | POA: Diagnosis not present

## 2019-12-29 DIAGNOSIS — Z4800 Encounter for change or removal of nonsurgical wound dressing: Secondary | ICD-10-CM | POA: Diagnosis not present

## 2019-12-29 DIAGNOSIS — E1151 Type 2 diabetes mellitus with diabetic peripheral angiopathy without gangrene: Secondary | ICD-10-CM | POA: Diagnosis not present

## 2019-12-29 DIAGNOSIS — Z9181 History of falling: Secondary | ICD-10-CM | POA: Diagnosis not present

## 2019-12-29 DIAGNOSIS — L97219 Non-pressure chronic ulcer of right calf with unspecified severity: Secondary | ICD-10-CM | POA: Diagnosis not present

## 2019-12-29 DIAGNOSIS — C189 Malignant neoplasm of colon, unspecified: Secondary | ICD-10-CM | POA: Diagnosis not present

## 2019-12-29 DIAGNOSIS — K5792 Diverticulitis of intestine, part unspecified, without perforation or abscess without bleeding: Secondary | ICD-10-CM | POA: Diagnosis not present

## 2019-12-29 DIAGNOSIS — G8929 Other chronic pain: Secondary | ICD-10-CM | POA: Diagnosis not present

## 2019-12-29 DIAGNOSIS — I110 Hypertensive heart disease with heart failure: Secondary | ICD-10-CM | POA: Diagnosis not present

## 2019-12-29 DIAGNOSIS — I5033 Acute on chronic diastolic (congestive) heart failure: Secondary | ICD-10-CM | POA: Diagnosis not present

## 2020-01-02 DIAGNOSIS — L97219 Non-pressure chronic ulcer of right calf with unspecified severity: Secondary | ICD-10-CM | POA: Diagnosis not present

## 2020-01-02 DIAGNOSIS — I872 Venous insufficiency (chronic) (peripheral): Secondary | ICD-10-CM | POA: Diagnosis not present

## 2020-01-02 DIAGNOSIS — C189 Malignant neoplasm of colon, unspecified: Secondary | ICD-10-CM | POA: Diagnosis not present

## 2020-01-02 DIAGNOSIS — Z794 Long term (current) use of insulin: Secondary | ICD-10-CM | POA: Diagnosis not present

## 2020-01-02 DIAGNOSIS — I5033 Acute on chronic diastolic (congestive) heart failure: Secondary | ICD-10-CM | POA: Diagnosis not present

## 2020-01-02 DIAGNOSIS — Z87440 Personal history of urinary (tract) infections: Secondary | ICD-10-CM | POA: Diagnosis not present

## 2020-01-02 DIAGNOSIS — L400 Psoriasis vulgaris: Secondary | ICD-10-CM | POA: Diagnosis not present

## 2020-01-02 DIAGNOSIS — Z9181 History of falling: Secondary | ICD-10-CM | POA: Diagnosis not present

## 2020-01-02 DIAGNOSIS — E1151 Type 2 diabetes mellitus with diabetic peripheral angiopathy without gangrene: Secondary | ICD-10-CM | POA: Diagnosis not present

## 2020-01-02 DIAGNOSIS — K5792 Diverticulitis of intestine, part unspecified, without perforation or abscess without bleeding: Secondary | ICD-10-CM | POA: Diagnosis not present

## 2020-01-02 DIAGNOSIS — I110 Hypertensive heart disease with heart failure: Secondary | ICD-10-CM | POA: Diagnosis not present

## 2020-01-02 DIAGNOSIS — Z4800 Encounter for change or removal of nonsurgical wound dressing: Secondary | ICD-10-CM | POA: Diagnosis not present

## 2020-01-02 DIAGNOSIS — G8929 Other chronic pain: Secondary | ICD-10-CM | POA: Diagnosis not present

## 2020-01-05 DIAGNOSIS — Z9181 History of falling: Secondary | ICD-10-CM | POA: Diagnosis not present

## 2020-01-05 DIAGNOSIS — K5792 Diverticulitis of intestine, part unspecified, without perforation or abscess without bleeding: Secondary | ICD-10-CM | POA: Diagnosis not present

## 2020-01-05 DIAGNOSIS — L97219 Non-pressure chronic ulcer of right calf with unspecified severity: Secondary | ICD-10-CM | POA: Diagnosis not present

## 2020-01-05 DIAGNOSIS — Z87440 Personal history of urinary (tract) infections: Secondary | ICD-10-CM | POA: Diagnosis not present

## 2020-01-05 DIAGNOSIS — I872 Venous insufficiency (chronic) (peripheral): Secondary | ICD-10-CM | POA: Diagnosis not present

## 2020-01-05 DIAGNOSIS — G8929 Other chronic pain: Secondary | ICD-10-CM | POA: Diagnosis not present

## 2020-01-05 DIAGNOSIS — I5033 Acute on chronic diastolic (congestive) heart failure: Secondary | ICD-10-CM | POA: Diagnosis not present

## 2020-01-05 DIAGNOSIS — I110 Hypertensive heart disease with heart failure: Secondary | ICD-10-CM | POA: Diagnosis not present

## 2020-01-05 DIAGNOSIS — C189 Malignant neoplasm of colon, unspecified: Secondary | ICD-10-CM | POA: Diagnosis not present

## 2020-01-05 DIAGNOSIS — Z794 Long term (current) use of insulin: Secondary | ICD-10-CM | POA: Diagnosis not present

## 2020-01-05 DIAGNOSIS — E1151 Type 2 diabetes mellitus with diabetic peripheral angiopathy without gangrene: Secondary | ICD-10-CM | POA: Diagnosis not present

## 2020-01-05 DIAGNOSIS — L400 Psoriasis vulgaris: Secondary | ICD-10-CM | POA: Diagnosis not present

## 2020-01-05 DIAGNOSIS — Z4800 Encounter for change or removal of nonsurgical wound dressing: Secondary | ICD-10-CM | POA: Diagnosis not present

## 2020-01-06 DIAGNOSIS — I10 Essential (primary) hypertension: Secondary | ICD-10-CM | POA: Diagnosis not present

## 2020-01-06 DIAGNOSIS — E119 Type 2 diabetes mellitus without complications: Secondary | ICD-10-CM | POA: Diagnosis not present

## 2020-01-06 DIAGNOSIS — I5032 Chronic diastolic (congestive) heart failure: Secondary | ICD-10-CM | POA: Diagnosis not present

## 2020-01-09 DIAGNOSIS — E1151 Type 2 diabetes mellitus with diabetic peripheral angiopathy without gangrene: Secondary | ICD-10-CM | POA: Diagnosis not present

## 2020-01-09 DIAGNOSIS — Z794 Long term (current) use of insulin: Secondary | ICD-10-CM | POA: Diagnosis not present

## 2020-01-09 DIAGNOSIS — Z9181 History of falling: Secondary | ICD-10-CM | POA: Diagnosis not present

## 2020-01-09 DIAGNOSIS — Z87440 Personal history of urinary (tract) infections: Secondary | ICD-10-CM | POA: Diagnosis not present

## 2020-01-09 DIAGNOSIS — I5033 Acute on chronic diastolic (congestive) heart failure: Secondary | ICD-10-CM | POA: Diagnosis not present

## 2020-01-09 DIAGNOSIS — G8929 Other chronic pain: Secondary | ICD-10-CM | POA: Diagnosis not present

## 2020-01-09 DIAGNOSIS — L400 Psoriasis vulgaris: Secondary | ICD-10-CM | POA: Diagnosis not present

## 2020-01-09 DIAGNOSIS — L97219 Non-pressure chronic ulcer of right calf with unspecified severity: Secondary | ICD-10-CM | POA: Diagnosis not present

## 2020-01-09 DIAGNOSIS — I872 Venous insufficiency (chronic) (peripheral): Secondary | ICD-10-CM | POA: Diagnosis not present

## 2020-01-09 DIAGNOSIS — K5792 Diverticulitis of intestine, part unspecified, without perforation or abscess without bleeding: Secondary | ICD-10-CM | POA: Diagnosis not present

## 2020-01-09 DIAGNOSIS — Z4800 Encounter for change or removal of nonsurgical wound dressing: Secondary | ICD-10-CM | POA: Diagnosis not present

## 2020-01-09 DIAGNOSIS — C189 Malignant neoplasm of colon, unspecified: Secondary | ICD-10-CM | POA: Diagnosis not present

## 2020-01-09 DIAGNOSIS — I110 Hypertensive heart disease with heart failure: Secondary | ICD-10-CM | POA: Diagnosis not present

## 2020-01-10 DIAGNOSIS — U071 COVID-19: Secondary | ICD-10-CM | POA: Diagnosis not present

## 2020-01-12 DIAGNOSIS — K5792 Diverticulitis of intestine, part unspecified, without perforation or abscess without bleeding: Secondary | ICD-10-CM | POA: Diagnosis not present

## 2020-01-12 DIAGNOSIS — Z4800 Encounter for change or removal of nonsurgical wound dressing: Secondary | ICD-10-CM | POA: Diagnosis not present

## 2020-01-12 DIAGNOSIS — I872 Venous insufficiency (chronic) (peripheral): Secondary | ICD-10-CM | POA: Diagnosis not present

## 2020-01-12 DIAGNOSIS — I5033 Acute on chronic diastolic (congestive) heart failure: Secondary | ICD-10-CM | POA: Diagnosis not present

## 2020-01-12 DIAGNOSIS — E1151 Type 2 diabetes mellitus with diabetic peripheral angiopathy without gangrene: Secondary | ICD-10-CM | POA: Diagnosis not present

## 2020-01-12 DIAGNOSIS — Z794 Long term (current) use of insulin: Secondary | ICD-10-CM | POA: Diagnosis not present

## 2020-01-12 DIAGNOSIS — I110 Hypertensive heart disease with heart failure: Secondary | ICD-10-CM | POA: Diagnosis not present

## 2020-01-12 DIAGNOSIS — Z87440 Personal history of urinary (tract) infections: Secondary | ICD-10-CM | POA: Diagnosis not present

## 2020-01-12 DIAGNOSIS — Z9181 History of falling: Secondary | ICD-10-CM | POA: Diagnosis not present

## 2020-01-12 DIAGNOSIS — L97219 Non-pressure chronic ulcer of right calf with unspecified severity: Secondary | ICD-10-CM | POA: Diagnosis not present

## 2020-01-12 DIAGNOSIS — G8929 Other chronic pain: Secondary | ICD-10-CM | POA: Diagnosis not present

## 2020-01-12 DIAGNOSIS — C189 Malignant neoplasm of colon, unspecified: Secondary | ICD-10-CM | POA: Diagnosis not present

## 2020-01-12 DIAGNOSIS — L400 Psoriasis vulgaris: Secondary | ICD-10-CM | POA: Diagnosis not present

## 2020-01-17 DIAGNOSIS — C189 Malignant neoplasm of colon, unspecified: Secondary | ICD-10-CM | POA: Diagnosis not present

## 2020-01-17 DIAGNOSIS — E1151 Type 2 diabetes mellitus with diabetic peripheral angiopathy without gangrene: Secondary | ICD-10-CM | POA: Diagnosis not present

## 2020-01-17 DIAGNOSIS — G8929 Other chronic pain: Secondary | ICD-10-CM | POA: Diagnosis not present

## 2020-01-17 DIAGNOSIS — L97219 Non-pressure chronic ulcer of right calf with unspecified severity: Secondary | ICD-10-CM | POA: Diagnosis not present

## 2020-01-17 DIAGNOSIS — K5792 Diverticulitis of intestine, part unspecified, without perforation or abscess without bleeding: Secondary | ICD-10-CM | POA: Diagnosis not present

## 2020-01-17 DIAGNOSIS — L400 Psoriasis vulgaris: Secondary | ICD-10-CM | POA: Diagnosis not present

## 2020-01-17 DIAGNOSIS — Z87440 Personal history of urinary (tract) infections: Secondary | ICD-10-CM | POA: Diagnosis not present

## 2020-01-17 DIAGNOSIS — I872 Venous insufficiency (chronic) (peripheral): Secondary | ICD-10-CM | POA: Diagnosis not present

## 2020-01-17 DIAGNOSIS — I5033 Acute on chronic diastolic (congestive) heart failure: Secondary | ICD-10-CM | POA: Diagnosis not present

## 2020-01-17 DIAGNOSIS — I110 Hypertensive heart disease with heart failure: Secondary | ICD-10-CM | POA: Diagnosis not present

## 2020-01-17 DIAGNOSIS — Z9181 History of falling: Secondary | ICD-10-CM | POA: Diagnosis not present

## 2020-01-17 DIAGNOSIS — Z4800 Encounter for change or removal of nonsurgical wound dressing: Secondary | ICD-10-CM | POA: Diagnosis not present

## 2020-01-17 DIAGNOSIS — Z794 Long term (current) use of insulin: Secondary | ICD-10-CM | POA: Diagnosis not present

## 2020-01-20 DIAGNOSIS — L97219 Non-pressure chronic ulcer of right calf with unspecified severity: Secondary | ICD-10-CM | POA: Diagnosis not present

## 2020-01-20 DIAGNOSIS — Z9181 History of falling: Secondary | ICD-10-CM | POA: Diagnosis not present

## 2020-01-20 DIAGNOSIS — G8929 Other chronic pain: Secondary | ICD-10-CM | POA: Diagnosis not present

## 2020-01-20 DIAGNOSIS — Z4800 Encounter for change or removal of nonsurgical wound dressing: Secondary | ICD-10-CM | POA: Diagnosis not present

## 2020-01-20 DIAGNOSIS — Z794 Long term (current) use of insulin: Secondary | ICD-10-CM | POA: Diagnosis not present

## 2020-01-20 DIAGNOSIS — K5792 Diverticulitis of intestine, part unspecified, without perforation or abscess without bleeding: Secondary | ICD-10-CM | POA: Diagnosis not present

## 2020-01-20 DIAGNOSIS — Z87440 Personal history of urinary (tract) infections: Secondary | ICD-10-CM | POA: Diagnosis not present

## 2020-01-20 DIAGNOSIS — I872 Venous insufficiency (chronic) (peripheral): Secondary | ICD-10-CM | POA: Diagnosis not present

## 2020-01-20 DIAGNOSIS — L97911 Non-pressure chronic ulcer of unspecified part of right lower leg limited to breakdown of skin: Secondary | ICD-10-CM | POA: Diagnosis not present

## 2020-01-20 DIAGNOSIS — E1151 Type 2 diabetes mellitus with diabetic peripheral angiopathy without gangrene: Secondary | ICD-10-CM | POA: Diagnosis not present

## 2020-01-20 DIAGNOSIS — I110 Hypertensive heart disease with heart failure: Secondary | ICD-10-CM | POA: Diagnosis not present

## 2020-01-20 DIAGNOSIS — I5033 Acute on chronic diastolic (congestive) heart failure: Secondary | ICD-10-CM | POA: Diagnosis not present

## 2020-01-20 DIAGNOSIS — L400 Psoriasis vulgaris: Secondary | ICD-10-CM | POA: Diagnosis not present

## 2020-01-20 DIAGNOSIS — E119 Type 2 diabetes mellitus without complications: Secondary | ICD-10-CM | POA: Diagnosis not present

## 2020-01-20 DIAGNOSIS — B351 Tinea unguium: Secondary | ICD-10-CM | POA: Diagnosis not present

## 2020-01-20 DIAGNOSIS — C189 Malignant neoplasm of colon, unspecified: Secondary | ICD-10-CM | POA: Diagnosis not present

## 2020-01-23 DIAGNOSIS — I872 Venous insufficiency (chronic) (peripheral): Secondary | ICD-10-CM | POA: Diagnosis not present

## 2020-01-23 DIAGNOSIS — L400 Psoriasis vulgaris: Secondary | ICD-10-CM | POA: Diagnosis not present

## 2020-01-23 DIAGNOSIS — I110 Hypertensive heart disease with heart failure: Secondary | ICD-10-CM | POA: Diagnosis not present

## 2020-01-23 DIAGNOSIS — Z794 Long term (current) use of insulin: Secondary | ICD-10-CM | POA: Diagnosis not present

## 2020-01-23 DIAGNOSIS — Z87440 Personal history of urinary (tract) infections: Secondary | ICD-10-CM | POA: Diagnosis not present

## 2020-01-23 DIAGNOSIS — G8929 Other chronic pain: Secondary | ICD-10-CM | POA: Diagnosis not present

## 2020-01-23 DIAGNOSIS — L97219 Non-pressure chronic ulcer of right calf with unspecified severity: Secondary | ICD-10-CM | POA: Diagnosis not present

## 2020-01-23 DIAGNOSIS — I5033 Acute on chronic diastolic (congestive) heart failure: Secondary | ICD-10-CM | POA: Diagnosis not present

## 2020-01-23 DIAGNOSIS — K5792 Diverticulitis of intestine, part unspecified, without perforation or abscess without bleeding: Secondary | ICD-10-CM | POA: Diagnosis not present

## 2020-01-23 DIAGNOSIS — Z4800 Encounter for change or removal of nonsurgical wound dressing: Secondary | ICD-10-CM | POA: Diagnosis not present

## 2020-01-23 DIAGNOSIS — C189 Malignant neoplasm of colon, unspecified: Secondary | ICD-10-CM | POA: Diagnosis not present

## 2020-01-23 DIAGNOSIS — E1151 Type 2 diabetes mellitus with diabetic peripheral angiopathy without gangrene: Secondary | ICD-10-CM | POA: Diagnosis not present

## 2020-01-23 DIAGNOSIS — Z9181 History of falling: Secondary | ICD-10-CM | POA: Diagnosis not present

## 2020-01-26 DIAGNOSIS — K5792 Diverticulitis of intestine, part unspecified, without perforation or abscess without bleeding: Secondary | ICD-10-CM | POA: Diagnosis not present

## 2020-01-26 DIAGNOSIS — I872 Venous insufficiency (chronic) (peripheral): Secondary | ICD-10-CM | POA: Diagnosis not present

## 2020-01-26 DIAGNOSIS — C189 Malignant neoplasm of colon, unspecified: Secondary | ICD-10-CM | POA: Diagnosis not present

## 2020-01-26 DIAGNOSIS — Z87440 Personal history of urinary (tract) infections: Secondary | ICD-10-CM | POA: Diagnosis not present

## 2020-01-26 DIAGNOSIS — Z4800 Encounter for change or removal of nonsurgical wound dressing: Secondary | ICD-10-CM | POA: Diagnosis not present

## 2020-01-26 DIAGNOSIS — I110 Hypertensive heart disease with heart failure: Secondary | ICD-10-CM | POA: Diagnosis not present

## 2020-01-26 DIAGNOSIS — L400 Psoriasis vulgaris: Secondary | ICD-10-CM | POA: Diagnosis not present

## 2020-01-26 DIAGNOSIS — L97219 Non-pressure chronic ulcer of right calf with unspecified severity: Secondary | ICD-10-CM | POA: Diagnosis not present

## 2020-01-26 DIAGNOSIS — Z9181 History of falling: Secondary | ICD-10-CM | POA: Diagnosis not present

## 2020-01-26 DIAGNOSIS — E1151 Type 2 diabetes mellitus with diabetic peripheral angiopathy without gangrene: Secondary | ICD-10-CM | POA: Diagnosis not present

## 2020-01-26 DIAGNOSIS — I5033 Acute on chronic diastolic (congestive) heart failure: Secondary | ICD-10-CM | POA: Diagnosis not present

## 2020-01-26 DIAGNOSIS — Z794 Long term (current) use of insulin: Secondary | ICD-10-CM | POA: Diagnosis not present

## 2020-01-26 DIAGNOSIS — G8929 Other chronic pain: Secondary | ICD-10-CM | POA: Diagnosis not present

## 2020-01-27 DIAGNOSIS — L97219 Non-pressure chronic ulcer of right calf with unspecified severity: Secondary | ICD-10-CM | POA: Diagnosis not present

## 2020-01-30 DIAGNOSIS — Z9181 History of falling: Secondary | ICD-10-CM | POA: Diagnosis not present

## 2020-01-30 DIAGNOSIS — G8929 Other chronic pain: Secondary | ICD-10-CM | POA: Diagnosis not present

## 2020-01-30 DIAGNOSIS — I5033 Acute on chronic diastolic (congestive) heart failure: Secondary | ICD-10-CM | POA: Diagnosis not present

## 2020-01-30 DIAGNOSIS — Z794 Long term (current) use of insulin: Secondary | ICD-10-CM | POA: Diagnosis not present

## 2020-01-30 DIAGNOSIS — C189 Malignant neoplasm of colon, unspecified: Secondary | ICD-10-CM | POA: Diagnosis not present

## 2020-01-30 DIAGNOSIS — I110 Hypertensive heart disease with heart failure: Secondary | ICD-10-CM | POA: Diagnosis not present

## 2020-01-30 DIAGNOSIS — L97219 Non-pressure chronic ulcer of right calf with unspecified severity: Secondary | ICD-10-CM | POA: Diagnosis not present

## 2020-01-30 DIAGNOSIS — L400 Psoriasis vulgaris: Secondary | ICD-10-CM | POA: Diagnosis not present

## 2020-01-30 DIAGNOSIS — K5792 Diverticulitis of intestine, part unspecified, without perforation or abscess without bleeding: Secondary | ICD-10-CM | POA: Diagnosis not present

## 2020-01-30 DIAGNOSIS — Z87440 Personal history of urinary (tract) infections: Secondary | ICD-10-CM | POA: Diagnosis not present

## 2020-01-30 DIAGNOSIS — Z4800 Encounter for change or removal of nonsurgical wound dressing: Secondary | ICD-10-CM | POA: Diagnosis not present

## 2020-01-30 DIAGNOSIS — I872 Venous insufficiency (chronic) (peripheral): Secondary | ICD-10-CM | POA: Diagnosis not present

## 2020-01-30 DIAGNOSIS — E1151 Type 2 diabetes mellitus with diabetic peripheral angiopathy without gangrene: Secondary | ICD-10-CM | POA: Diagnosis not present

## 2020-01-31 DIAGNOSIS — R011 Cardiac murmur, unspecified: Secondary | ICD-10-CM | POA: Diagnosis not present

## 2020-02-01 DIAGNOSIS — D492 Neoplasm of unspecified behavior of bone, soft tissue, and skin: Secondary | ICD-10-CM | POA: Diagnosis not present

## 2020-02-01 DIAGNOSIS — I6523 Occlusion and stenosis of bilateral carotid arteries: Secondary | ICD-10-CM | POA: Diagnosis not present

## 2020-02-01 DIAGNOSIS — R590 Localized enlarged lymph nodes: Secondary | ICD-10-CM | POA: Diagnosis not present

## 2020-02-01 DIAGNOSIS — R0989 Other specified symptoms and signs involving the circulatory and respiratory systems: Secondary | ICD-10-CM | POA: Diagnosis not present

## 2020-02-01 DIAGNOSIS — R229 Localized swelling, mass and lump, unspecified: Secondary | ICD-10-CM | POA: Diagnosis not present

## 2020-02-02 DIAGNOSIS — L400 Psoriasis vulgaris: Secondary | ICD-10-CM | POA: Diagnosis not present

## 2020-02-02 DIAGNOSIS — I5033 Acute on chronic diastolic (congestive) heart failure: Secondary | ICD-10-CM | POA: Diagnosis not present

## 2020-02-02 DIAGNOSIS — I110 Hypertensive heart disease with heart failure: Secondary | ICD-10-CM | POA: Diagnosis not present

## 2020-02-02 DIAGNOSIS — Z87440 Personal history of urinary (tract) infections: Secondary | ICD-10-CM | POA: Diagnosis not present

## 2020-02-02 DIAGNOSIS — Z9181 History of falling: Secondary | ICD-10-CM | POA: Diagnosis not present

## 2020-02-02 DIAGNOSIS — L97219 Non-pressure chronic ulcer of right calf with unspecified severity: Secondary | ICD-10-CM | POA: Diagnosis not present

## 2020-02-02 DIAGNOSIS — E1151 Type 2 diabetes mellitus with diabetic peripheral angiopathy without gangrene: Secondary | ICD-10-CM | POA: Diagnosis not present

## 2020-02-02 DIAGNOSIS — I872 Venous insufficiency (chronic) (peripheral): Secondary | ICD-10-CM | POA: Diagnosis not present

## 2020-02-02 DIAGNOSIS — C189 Malignant neoplasm of colon, unspecified: Secondary | ICD-10-CM | POA: Diagnosis not present

## 2020-02-02 DIAGNOSIS — Z794 Long term (current) use of insulin: Secondary | ICD-10-CM | POA: Diagnosis not present

## 2020-02-02 DIAGNOSIS — G8929 Other chronic pain: Secondary | ICD-10-CM | POA: Diagnosis not present

## 2020-02-02 DIAGNOSIS — Z4800 Encounter for change or removal of nonsurgical wound dressing: Secondary | ICD-10-CM | POA: Diagnosis not present

## 2020-02-02 DIAGNOSIS — K5792 Diverticulitis of intestine, part unspecified, without perforation or abscess without bleeding: Secondary | ICD-10-CM | POA: Diagnosis not present

## 2020-02-06 DIAGNOSIS — C189 Malignant neoplasm of colon, unspecified: Secondary | ICD-10-CM | POA: Diagnosis not present

## 2020-02-06 DIAGNOSIS — K5792 Diverticulitis of intestine, part unspecified, without perforation or abscess without bleeding: Secondary | ICD-10-CM | POA: Diagnosis not present

## 2020-02-06 DIAGNOSIS — R221 Localized swelling, mass and lump, neck: Secondary | ICD-10-CM | POA: Diagnosis not present

## 2020-02-06 DIAGNOSIS — G8929 Other chronic pain: Secondary | ICD-10-CM | POA: Diagnosis not present

## 2020-02-06 DIAGNOSIS — E1151 Type 2 diabetes mellitus with diabetic peripheral angiopathy without gangrene: Secondary | ICD-10-CM | POA: Diagnosis not present

## 2020-02-06 DIAGNOSIS — I872 Venous insufficiency (chronic) (peripheral): Secondary | ICD-10-CM | POA: Diagnosis not present

## 2020-02-06 DIAGNOSIS — Z9181 History of falling: Secondary | ICD-10-CM | POA: Diagnosis not present

## 2020-02-06 DIAGNOSIS — I700 Atherosclerosis of aorta: Secondary | ICD-10-CM | POA: Diagnosis not present

## 2020-02-06 DIAGNOSIS — Z4800 Encounter for change or removal of nonsurgical wound dressing: Secondary | ICD-10-CM | POA: Diagnosis not present

## 2020-02-06 DIAGNOSIS — Z87440 Personal history of urinary (tract) infections: Secondary | ICD-10-CM | POA: Diagnosis not present

## 2020-02-06 DIAGNOSIS — L97219 Non-pressure chronic ulcer of right calf with unspecified severity: Secondary | ICD-10-CM | POA: Diagnosis not present

## 2020-02-06 DIAGNOSIS — I714 Abdominal aortic aneurysm, without rupture: Secondary | ICD-10-CM | POA: Diagnosis not present

## 2020-02-06 DIAGNOSIS — I5033 Acute on chronic diastolic (congestive) heart failure: Secondary | ICD-10-CM | POA: Diagnosis not present

## 2020-02-06 DIAGNOSIS — L400 Psoriasis vulgaris: Secondary | ICD-10-CM | POA: Diagnosis not present

## 2020-02-06 DIAGNOSIS — I110 Hypertensive heart disease with heart failure: Secondary | ICD-10-CM | POA: Diagnosis not present

## 2020-02-06 DIAGNOSIS — Z794 Long term (current) use of insulin: Secondary | ICD-10-CM | POA: Diagnosis not present

## 2020-02-06 DIAGNOSIS — E042 Nontoxic multinodular goiter: Secondary | ICD-10-CM | POA: Diagnosis not present

## 2020-02-09 DIAGNOSIS — K5792 Diverticulitis of intestine, part unspecified, without perforation or abscess without bleeding: Secondary | ICD-10-CM | POA: Diagnosis not present

## 2020-02-09 DIAGNOSIS — Z4800 Encounter for change or removal of nonsurgical wound dressing: Secondary | ICD-10-CM | POA: Diagnosis not present

## 2020-02-09 DIAGNOSIS — G8929 Other chronic pain: Secondary | ICD-10-CM | POA: Diagnosis not present

## 2020-02-09 DIAGNOSIS — Z794 Long term (current) use of insulin: Secondary | ICD-10-CM | POA: Diagnosis not present

## 2020-02-09 DIAGNOSIS — Z9181 History of falling: Secondary | ICD-10-CM | POA: Diagnosis not present

## 2020-02-09 DIAGNOSIS — Z87440 Personal history of urinary (tract) infections: Secondary | ICD-10-CM | POA: Diagnosis not present

## 2020-02-09 DIAGNOSIS — I872 Venous insufficiency (chronic) (peripheral): Secondary | ICD-10-CM | POA: Diagnosis not present

## 2020-02-09 DIAGNOSIS — L400 Psoriasis vulgaris: Secondary | ICD-10-CM | POA: Diagnosis not present

## 2020-02-09 DIAGNOSIS — I5033 Acute on chronic diastolic (congestive) heart failure: Secondary | ICD-10-CM | POA: Diagnosis not present

## 2020-02-09 DIAGNOSIS — E1151 Type 2 diabetes mellitus with diabetic peripheral angiopathy without gangrene: Secondary | ICD-10-CM | POA: Diagnosis not present

## 2020-02-09 DIAGNOSIS — I110 Hypertensive heart disease with heart failure: Secondary | ICD-10-CM | POA: Diagnosis not present

## 2020-02-09 DIAGNOSIS — L97219 Non-pressure chronic ulcer of right calf with unspecified severity: Secondary | ICD-10-CM | POA: Diagnosis not present

## 2020-02-09 DIAGNOSIS — C189 Malignant neoplasm of colon, unspecified: Secondary | ICD-10-CM | POA: Diagnosis not present

## 2020-02-10 DIAGNOSIS — K5900 Constipation, unspecified: Secondary | ICD-10-CM | POA: Diagnosis not present

## 2020-02-10 DIAGNOSIS — E785 Hyperlipidemia, unspecified: Secondary | ICD-10-CM | POA: Diagnosis not present

## 2020-02-10 DIAGNOSIS — R600 Localized edema: Secondary | ICD-10-CM | POA: Diagnosis not present

## 2020-02-12 DIAGNOSIS — R609 Edema, unspecified: Secondary | ICD-10-CM | POA: Diagnosis not present

## 2020-02-13 DIAGNOSIS — I872 Venous insufficiency (chronic) (peripheral): Secondary | ICD-10-CM | POA: Diagnosis not present

## 2020-02-13 DIAGNOSIS — G8929 Other chronic pain: Secondary | ICD-10-CM | POA: Diagnosis not present

## 2020-02-13 DIAGNOSIS — L97211 Non-pressure chronic ulcer of right calf limited to breakdown of skin: Secondary | ICD-10-CM | POA: Diagnosis not present

## 2020-02-13 DIAGNOSIS — K5792 Diverticulitis of intestine, part unspecified, without perforation or abscess without bleeding: Secondary | ICD-10-CM | POA: Diagnosis not present

## 2020-02-13 DIAGNOSIS — L400 Psoriasis vulgaris: Secondary | ICD-10-CM | POA: Diagnosis not present

## 2020-02-13 DIAGNOSIS — Z87440 Personal history of urinary (tract) infections: Secondary | ICD-10-CM | POA: Diagnosis not present

## 2020-02-13 DIAGNOSIS — I5033 Acute on chronic diastolic (congestive) heart failure: Secondary | ICD-10-CM | POA: Diagnosis not present

## 2020-02-13 DIAGNOSIS — I110 Hypertensive heart disease with heart failure: Secondary | ICD-10-CM | POA: Diagnosis not present

## 2020-02-13 DIAGNOSIS — E1151 Type 2 diabetes mellitus with diabetic peripheral angiopathy without gangrene: Secondary | ICD-10-CM | POA: Diagnosis not present

## 2020-02-13 DIAGNOSIS — C189 Malignant neoplasm of colon, unspecified: Secondary | ICD-10-CM | POA: Diagnosis not present

## 2020-02-13 DIAGNOSIS — Z4800 Encounter for change or removal of nonsurgical wound dressing: Secondary | ICD-10-CM | POA: Diagnosis not present

## 2020-02-13 DIAGNOSIS — Z794 Long term (current) use of insulin: Secondary | ICD-10-CM | POA: Diagnosis not present

## 2020-02-13 DIAGNOSIS — Z9181 History of falling: Secondary | ICD-10-CM | POA: Diagnosis not present

## 2020-02-14 DIAGNOSIS — T391X1A Poisoning by 4-Aminophenol derivatives, accidental (unintentional), initial encounter: Secondary | ICD-10-CM | POA: Diagnosis not present

## 2020-02-16 DIAGNOSIS — Z87440 Personal history of urinary (tract) infections: Secondary | ICD-10-CM | POA: Diagnosis not present

## 2020-02-16 DIAGNOSIS — Z794 Long term (current) use of insulin: Secondary | ICD-10-CM | POA: Diagnosis not present

## 2020-02-16 DIAGNOSIS — K5792 Diverticulitis of intestine, part unspecified, without perforation or abscess without bleeding: Secondary | ICD-10-CM | POA: Diagnosis not present

## 2020-02-16 DIAGNOSIS — I872 Venous insufficiency (chronic) (peripheral): Secondary | ICD-10-CM | POA: Diagnosis not present

## 2020-02-16 DIAGNOSIS — E7849 Other hyperlipidemia: Secondary | ICD-10-CM | POA: Diagnosis not present

## 2020-02-16 DIAGNOSIS — E119 Type 2 diabetes mellitus without complications: Secondary | ICD-10-CM | POA: Diagnosis not present

## 2020-02-16 DIAGNOSIS — I110 Hypertensive heart disease with heart failure: Secondary | ICD-10-CM | POA: Diagnosis not present

## 2020-02-16 DIAGNOSIS — C189 Malignant neoplasm of colon, unspecified: Secondary | ICD-10-CM | POA: Diagnosis not present

## 2020-02-16 DIAGNOSIS — G8929 Other chronic pain: Secondary | ICD-10-CM | POA: Diagnosis not present

## 2020-02-16 DIAGNOSIS — L400 Psoriasis vulgaris: Secondary | ICD-10-CM | POA: Diagnosis not present

## 2020-02-16 DIAGNOSIS — I5033 Acute on chronic diastolic (congestive) heart failure: Secondary | ICD-10-CM | POA: Diagnosis not present

## 2020-02-16 DIAGNOSIS — Z79899 Other long term (current) drug therapy: Secondary | ICD-10-CM | POA: Diagnosis not present

## 2020-02-16 DIAGNOSIS — Z4800 Encounter for change or removal of nonsurgical wound dressing: Secondary | ICD-10-CM | POA: Diagnosis not present

## 2020-02-16 DIAGNOSIS — D518 Other vitamin B12 deficiency anemias: Secondary | ICD-10-CM | POA: Diagnosis not present

## 2020-02-16 DIAGNOSIS — Z9181 History of falling: Secondary | ICD-10-CM | POA: Diagnosis not present

## 2020-02-16 DIAGNOSIS — E1151 Type 2 diabetes mellitus with diabetic peripheral angiopathy without gangrene: Secondary | ICD-10-CM | POA: Diagnosis not present

## 2020-02-16 DIAGNOSIS — L97211 Non-pressure chronic ulcer of right calf limited to breakdown of skin: Secondary | ICD-10-CM | POA: Diagnosis not present

## 2020-02-20 DIAGNOSIS — Z87440 Personal history of urinary (tract) infections: Secondary | ICD-10-CM | POA: Diagnosis not present

## 2020-02-20 DIAGNOSIS — I872 Venous insufficiency (chronic) (peripheral): Secondary | ICD-10-CM | POA: Diagnosis not present

## 2020-02-20 DIAGNOSIS — I110 Hypertensive heart disease with heart failure: Secondary | ICD-10-CM | POA: Diagnosis not present

## 2020-02-20 DIAGNOSIS — K5792 Diverticulitis of intestine, part unspecified, without perforation or abscess without bleeding: Secondary | ICD-10-CM | POA: Diagnosis not present

## 2020-02-20 DIAGNOSIS — Z794 Long term (current) use of insulin: Secondary | ICD-10-CM | POA: Diagnosis not present

## 2020-02-20 DIAGNOSIS — C189 Malignant neoplasm of colon, unspecified: Secondary | ICD-10-CM | POA: Diagnosis not present

## 2020-02-20 DIAGNOSIS — I5033 Acute on chronic diastolic (congestive) heart failure: Secondary | ICD-10-CM | POA: Diagnosis not present

## 2020-02-20 DIAGNOSIS — E1151 Type 2 diabetes mellitus with diabetic peripheral angiopathy without gangrene: Secondary | ICD-10-CM | POA: Diagnosis not present

## 2020-02-20 DIAGNOSIS — L400 Psoriasis vulgaris: Secondary | ICD-10-CM | POA: Diagnosis not present

## 2020-02-20 DIAGNOSIS — Z9181 History of falling: Secondary | ICD-10-CM | POA: Diagnosis not present

## 2020-02-20 DIAGNOSIS — G8929 Other chronic pain: Secondary | ICD-10-CM | POA: Diagnosis not present

## 2020-02-20 DIAGNOSIS — Z4800 Encounter for change or removal of nonsurgical wound dressing: Secondary | ICD-10-CM | POA: Diagnosis not present

## 2020-02-20 DIAGNOSIS — L97211 Non-pressure chronic ulcer of right calf limited to breakdown of skin: Secondary | ICD-10-CM | POA: Diagnosis not present

## 2020-02-23 DIAGNOSIS — Z87440 Personal history of urinary (tract) infections: Secondary | ICD-10-CM | POA: Diagnosis not present

## 2020-02-23 DIAGNOSIS — C189 Malignant neoplasm of colon, unspecified: Secondary | ICD-10-CM | POA: Diagnosis not present

## 2020-02-23 DIAGNOSIS — Z4800 Encounter for change or removal of nonsurgical wound dressing: Secondary | ICD-10-CM | POA: Diagnosis not present

## 2020-02-23 DIAGNOSIS — L97211 Non-pressure chronic ulcer of right calf limited to breakdown of skin: Secondary | ICD-10-CM | POA: Diagnosis not present

## 2020-02-23 DIAGNOSIS — Z9181 History of falling: Secondary | ICD-10-CM | POA: Diagnosis not present

## 2020-02-23 DIAGNOSIS — I5033 Acute on chronic diastolic (congestive) heart failure: Secondary | ICD-10-CM | POA: Diagnosis not present

## 2020-02-23 DIAGNOSIS — G8929 Other chronic pain: Secondary | ICD-10-CM | POA: Diagnosis not present

## 2020-02-23 DIAGNOSIS — I110 Hypertensive heart disease with heart failure: Secondary | ICD-10-CM | POA: Diagnosis not present

## 2020-02-23 DIAGNOSIS — Z794 Long term (current) use of insulin: Secondary | ICD-10-CM | POA: Diagnosis not present

## 2020-02-23 DIAGNOSIS — L400 Psoriasis vulgaris: Secondary | ICD-10-CM | POA: Diagnosis not present

## 2020-02-23 DIAGNOSIS — I872 Venous insufficiency (chronic) (peripheral): Secondary | ICD-10-CM | POA: Diagnosis not present

## 2020-02-23 DIAGNOSIS — E1151 Type 2 diabetes mellitus with diabetic peripheral angiopathy without gangrene: Secondary | ICD-10-CM | POA: Diagnosis not present

## 2020-02-23 DIAGNOSIS — K5792 Diverticulitis of intestine, part unspecified, without perforation or abscess without bleeding: Secondary | ICD-10-CM | POA: Diagnosis not present

## 2020-02-27 DIAGNOSIS — L400 Psoriasis vulgaris: Secondary | ICD-10-CM | POA: Diagnosis not present

## 2020-02-27 DIAGNOSIS — I110 Hypertensive heart disease with heart failure: Secondary | ICD-10-CM | POA: Diagnosis not present

## 2020-02-27 DIAGNOSIS — G8929 Other chronic pain: Secondary | ICD-10-CM | POA: Diagnosis not present

## 2020-02-27 DIAGNOSIS — Z87440 Personal history of urinary (tract) infections: Secondary | ICD-10-CM | POA: Diagnosis not present

## 2020-02-27 DIAGNOSIS — I5033 Acute on chronic diastolic (congestive) heart failure: Secondary | ICD-10-CM | POA: Diagnosis not present

## 2020-02-27 DIAGNOSIS — C189 Malignant neoplasm of colon, unspecified: Secondary | ICD-10-CM | POA: Diagnosis not present

## 2020-02-27 DIAGNOSIS — K5792 Diverticulitis of intestine, part unspecified, without perforation or abscess without bleeding: Secondary | ICD-10-CM | POA: Diagnosis not present

## 2020-02-27 DIAGNOSIS — E1151 Type 2 diabetes mellitus with diabetic peripheral angiopathy without gangrene: Secondary | ICD-10-CM | POA: Diagnosis not present

## 2020-02-27 DIAGNOSIS — Z794 Long term (current) use of insulin: Secondary | ICD-10-CM | POA: Diagnosis not present

## 2020-02-27 DIAGNOSIS — Z9181 History of falling: Secondary | ICD-10-CM | POA: Diagnosis not present

## 2020-02-27 DIAGNOSIS — L97211 Non-pressure chronic ulcer of right calf limited to breakdown of skin: Secondary | ICD-10-CM | POA: Diagnosis not present

## 2020-02-27 DIAGNOSIS — Z4800 Encounter for change or removal of nonsurgical wound dressing: Secondary | ICD-10-CM | POA: Diagnosis not present

## 2020-02-27 DIAGNOSIS — I872 Venous insufficiency (chronic) (peripheral): Secondary | ICD-10-CM | POA: Diagnosis not present

## 2020-03-01 DIAGNOSIS — Z4800 Encounter for change or removal of nonsurgical wound dressing: Secondary | ICD-10-CM | POA: Diagnosis not present

## 2020-03-01 DIAGNOSIS — E1151 Type 2 diabetes mellitus with diabetic peripheral angiopathy without gangrene: Secondary | ICD-10-CM | POA: Diagnosis not present

## 2020-03-01 DIAGNOSIS — G8929 Other chronic pain: Secondary | ICD-10-CM | POA: Diagnosis not present

## 2020-03-01 DIAGNOSIS — Z794 Long term (current) use of insulin: Secondary | ICD-10-CM | POA: Diagnosis not present

## 2020-03-01 DIAGNOSIS — I110 Hypertensive heart disease with heart failure: Secondary | ICD-10-CM | POA: Diagnosis not present

## 2020-03-01 DIAGNOSIS — I5033 Acute on chronic diastolic (congestive) heart failure: Secondary | ICD-10-CM | POA: Diagnosis not present

## 2020-03-01 DIAGNOSIS — L97211 Non-pressure chronic ulcer of right calf limited to breakdown of skin: Secondary | ICD-10-CM | POA: Diagnosis not present

## 2020-03-01 DIAGNOSIS — L400 Psoriasis vulgaris: Secondary | ICD-10-CM | POA: Diagnosis not present

## 2020-03-01 DIAGNOSIS — I872 Venous insufficiency (chronic) (peripheral): Secondary | ICD-10-CM | POA: Diagnosis not present

## 2020-03-01 DIAGNOSIS — Z87440 Personal history of urinary (tract) infections: Secondary | ICD-10-CM | POA: Diagnosis not present

## 2020-03-01 DIAGNOSIS — K5792 Diverticulitis of intestine, part unspecified, without perforation or abscess without bleeding: Secondary | ICD-10-CM | POA: Diagnosis not present

## 2020-03-01 DIAGNOSIS — Z9181 History of falling: Secondary | ICD-10-CM | POA: Diagnosis not present

## 2020-03-01 DIAGNOSIS — C189 Malignant neoplasm of colon, unspecified: Secondary | ICD-10-CM | POA: Diagnosis not present

## 2020-03-04 DIAGNOSIS — Z87440 Personal history of urinary (tract) infections: Secondary | ICD-10-CM | POA: Diagnosis not present

## 2020-03-04 DIAGNOSIS — C189 Malignant neoplasm of colon, unspecified: Secondary | ICD-10-CM | POA: Diagnosis not present

## 2020-03-04 DIAGNOSIS — Z4800 Encounter for change or removal of nonsurgical wound dressing: Secondary | ICD-10-CM | POA: Diagnosis not present

## 2020-03-04 DIAGNOSIS — Z794 Long term (current) use of insulin: Secondary | ICD-10-CM | POA: Diagnosis not present

## 2020-03-04 DIAGNOSIS — E1151 Type 2 diabetes mellitus with diabetic peripheral angiopathy without gangrene: Secondary | ICD-10-CM | POA: Diagnosis not present

## 2020-03-04 DIAGNOSIS — L97211 Non-pressure chronic ulcer of right calf limited to breakdown of skin: Secondary | ICD-10-CM | POA: Diagnosis not present

## 2020-03-04 DIAGNOSIS — I5033 Acute on chronic diastolic (congestive) heart failure: Secondary | ICD-10-CM | POA: Diagnosis not present

## 2020-03-04 DIAGNOSIS — G8929 Other chronic pain: Secondary | ICD-10-CM | POA: Diagnosis not present

## 2020-03-04 DIAGNOSIS — Z9181 History of falling: Secondary | ICD-10-CM | POA: Diagnosis not present

## 2020-03-04 DIAGNOSIS — I872 Venous insufficiency (chronic) (peripheral): Secondary | ICD-10-CM | POA: Diagnosis not present

## 2020-03-04 DIAGNOSIS — L400 Psoriasis vulgaris: Secondary | ICD-10-CM | POA: Diagnosis not present

## 2020-03-04 DIAGNOSIS — K5792 Diverticulitis of intestine, part unspecified, without perforation or abscess without bleeding: Secondary | ICD-10-CM | POA: Diagnosis not present

## 2020-03-04 DIAGNOSIS — I110 Hypertensive heart disease with heart failure: Secondary | ICD-10-CM | POA: Diagnosis not present

## 2020-03-08 DIAGNOSIS — L400 Psoriasis vulgaris: Secondary | ICD-10-CM | POA: Diagnosis not present

## 2020-03-08 DIAGNOSIS — Z9181 History of falling: Secondary | ICD-10-CM | POA: Diagnosis not present

## 2020-03-08 DIAGNOSIS — C189 Malignant neoplasm of colon, unspecified: Secondary | ICD-10-CM | POA: Diagnosis not present

## 2020-03-08 DIAGNOSIS — L97211 Non-pressure chronic ulcer of right calf limited to breakdown of skin: Secondary | ICD-10-CM | POA: Diagnosis not present

## 2020-03-08 DIAGNOSIS — Z4800 Encounter for change or removal of nonsurgical wound dressing: Secondary | ICD-10-CM | POA: Diagnosis not present

## 2020-03-08 DIAGNOSIS — I872 Venous insufficiency (chronic) (peripheral): Secondary | ICD-10-CM | POA: Diagnosis not present

## 2020-03-08 DIAGNOSIS — I110 Hypertensive heart disease with heart failure: Secondary | ICD-10-CM | POA: Diagnosis not present

## 2020-03-08 DIAGNOSIS — Z794 Long term (current) use of insulin: Secondary | ICD-10-CM | POA: Diagnosis not present

## 2020-03-08 DIAGNOSIS — G8929 Other chronic pain: Secondary | ICD-10-CM | POA: Diagnosis not present

## 2020-03-08 DIAGNOSIS — I5033 Acute on chronic diastolic (congestive) heart failure: Secondary | ICD-10-CM | POA: Diagnosis not present

## 2020-03-08 DIAGNOSIS — K5792 Diverticulitis of intestine, part unspecified, without perforation or abscess without bleeding: Secondary | ICD-10-CM | POA: Diagnosis not present

## 2020-03-08 DIAGNOSIS — E1151 Type 2 diabetes mellitus with diabetic peripheral angiopathy without gangrene: Secondary | ICD-10-CM | POA: Diagnosis not present

## 2020-03-08 DIAGNOSIS — Z87440 Personal history of urinary (tract) infections: Secondary | ICD-10-CM | POA: Diagnosis not present

## 2020-03-09 DIAGNOSIS — I10 Essential (primary) hypertension: Secondary | ICD-10-CM | POA: Diagnosis not present

## 2020-03-09 DIAGNOSIS — I5032 Chronic diastolic (congestive) heart failure: Secondary | ICD-10-CM | POA: Diagnosis not present

## 2020-03-09 DIAGNOSIS — E119 Type 2 diabetes mellitus without complications: Secondary | ICD-10-CM | POA: Diagnosis not present

## 2020-03-09 DIAGNOSIS — K5900 Constipation, unspecified: Secondary | ICD-10-CM | POA: Diagnosis not present

## 2020-03-12 DIAGNOSIS — Z9181 History of falling: Secondary | ICD-10-CM | POA: Diagnosis not present

## 2020-03-12 DIAGNOSIS — K5792 Diverticulitis of intestine, part unspecified, without perforation or abscess without bleeding: Secondary | ICD-10-CM | POA: Diagnosis not present

## 2020-03-12 DIAGNOSIS — Z4800 Encounter for change or removal of nonsurgical wound dressing: Secondary | ICD-10-CM | POA: Diagnosis not present

## 2020-03-12 DIAGNOSIS — E1151 Type 2 diabetes mellitus with diabetic peripheral angiopathy without gangrene: Secondary | ICD-10-CM | POA: Diagnosis not present

## 2020-03-12 DIAGNOSIS — L97211 Non-pressure chronic ulcer of right calf limited to breakdown of skin: Secondary | ICD-10-CM | POA: Diagnosis not present

## 2020-03-12 DIAGNOSIS — L400 Psoriasis vulgaris: Secondary | ICD-10-CM | POA: Diagnosis not present

## 2020-03-12 DIAGNOSIS — I5033 Acute on chronic diastolic (congestive) heart failure: Secondary | ICD-10-CM | POA: Diagnosis not present

## 2020-03-12 DIAGNOSIS — G8929 Other chronic pain: Secondary | ICD-10-CM | POA: Diagnosis not present

## 2020-03-12 DIAGNOSIS — I872 Venous insufficiency (chronic) (peripheral): Secondary | ICD-10-CM | POA: Diagnosis not present

## 2020-03-12 DIAGNOSIS — I110 Hypertensive heart disease with heart failure: Secondary | ICD-10-CM | POA: Diagnosis not present

## 2020-03-12 DIAGNOSIS — Z87440 Personal history of urinary (tract) infections: Secondary | ICD-10-CM | POA: Diagnosis not present

## 2020-03-12 DIAGNOSIS — Z794 Long term (current) use of insulin: Secondary | ICD-10-CM | POA: Diagnosis not present

## 2020-03-12 DIAGNOSIS — C189 Malignant neoplasm of colon, unspecified: Secondary | ICD-10-CM | POA: Diagnosis not present

## 2020-03-15 DIAGNOSIS — I110 Hypertensive heart disease with heart failure: Secondary | ICD-10-CM | POA: Diagnosis not present

## 2020-03-15 DIAGNOSIS — Z87440 Personal history of urinary (tract) infections: Secondary | ICD-10-CM | POA: Diagnosis not present

## 2020-03-15 DIAGNOSIS — Z9181 History of falling: Secondary | ICD-10-CM | POA: Diagnosis not present

## 2020-03-15 DIAGNOSIS — C189 Malignant neoplasm of colon, unspecified: Secondary | ICD-10-CM | POA: Diagnosis not present

## 2020-03-15 DIAGNOSIS — L400 Psoriasis vulgaris: Secondary | ICD-10-CM | POA: Diagnosis not present

## 2020-03-15 DIAGNOSIS — G8929 Other chronic pain: Secondary | ICD-10-CM | POA: Diagnosis not present

## 2020-03-15 DIAGNOSIS — I5033 Acute on chronic diastolic (congestive) heart failure: Secondary | ICD-10-CM | POA: Diagnosis not present

## 2020-03-15 DIAGNOSIS — E1151 Type 2 diabetes mellitus with diabetic peripheral angiopathy without gangrene: Secondary | ICD-10-CM | POA: Diagnosis not present

## 2020-03-15 DIAGNOSIS — L97211 Non-pressure chronic ulcer of right calf limited to breakdown of skin: Secondary | ICD-10-CM | POA: Diagnosis not present

## 2020-03-15 DIAGNOSIS — K5792 Diverticulitis of intestine, part unspecified, without perforation or abscess without bleeding: Secondary | ICD-10-CM | POA: Diagnosis not present

## 2020-03-15 DIAGNOSIS — Z794 Long term (current) use of insulin: Secondary | ICD-10-CM | POA: Diagnosis not present

## 2020-03-15 DIAGNOSIS — I872 Venous insufficiency (chronic) (peripheral): Secondary | ICD-10-CM | POA: Diagnosis not present

## 2020-03-15 DIAGNOSIS — Z4800 Encounter for change or removal of nonsurgical wound dressing: Secondary | ICD-10-CM | POA: Diagnosis not present

## 2020-03-20 DIAGNOSIS — Z4800 Encounter for change or removal of nonsurgical wound dressing: Secondary | ICD-10-CM | POA: Diagnosis not present

## 2020-03-20 DIAGNOSIS — Z87440 Personal history of urinary (tract) infections: Secondary | ICD-10-CM | POA: Diagnosis not present

## 2020-03-20 DIAGNOSIS — G8929 Other chronic pain: Secondary | ICD-10-CM | POA: Diagnosis not present

## 2020-03-20 DIAGNOSIS — E1151 Type 2 diabetes mellitus with diabetic peripheral angiopathy without gangrene: Secondary | ICD-10-CM | POA: Diagnosis not present

## 2020-03-20 DIAGNOSIS — I5033 Acute on chronic diastolic (congestive) heart failure: Secondary | ICD-10-CM | POA: Diagnosis not present

## 2020-03-20 DIAGNOSIS — C189 Malignant neoplasm of colon, unspecified: Secondary | ICD-10-CM | POA: Diagnosis not present

## 2020-03-20 DIAGNOSIS — K5792 Diverticulitis of intestine, part unspecified, without perforation or abscess without bleeding: Secondary | ICD-10-CM | POA: Diagnosis not present

## 2020-03-20 DIAGNOSIS — L97211 Non-pressure chronic ulcer of right calf limited to breakdown of skin: Secondary | ICD-10-CM | POA: Diagnosis not present

## 2020-03-20 DIAGNOSIS — I110 Hypertensive heart disease with heart failure: Secondary | ICD-10-CM | POA: Diagnosis not present

## 2020-03-20 DIAGNOSIS — I872 Venous insufficiency (chronic) (peripheral): Secondary | ICD-10-CM | POA: Diagnosis not present

## 2020-03-20 DIAGNOSIS — Z794 Long term (current) use of insulin: Secondary | ICD-10-CM | POA: Diagnosis not present

## 2020-03-20 DIAGNOSIS — L400 Psoriasis vulgaris: Secondary | ICD-10-CM | POA: Diagnosis not present

## 2020-03-20 DIAGNOSIS — Z9181 History of falling: Secondary | ICD-10-CM | POA: Diagnosis not present

## 2020-03-21 DIAGNOSIS — E119 Type 2 diabetes mellitus without complications: Secondary | ICD-10-CM | POA: Diagnosis not present

## 2020-03-21 DIAGNOSIS — E559 Vitamin D deficiency, unspecified: Secondary | ICD-10-CM | POA: Diagnosis not present

## 2020-03-21 DIAGNOSIS — L97219 Non-pressure chronic ulcer of right calf with unspecified severity: Secondary | ICD-10-CM | POA: Diagnosis not present

## 2020-03-21 DIAGNOSIS — D518 Other vitamin B12 deficiency anemias: Secondary | ICD-10-CM | POA: Diagnosis not present

## 2020-03-21 DIAGNOSIS — E7849 Other hyperlipidemia: Secondary | ICD-10-CM | POA: Diagnosis not present

## 2020-03-21 DIAGNOSIS — Z79899 Other long term (current) drug therapy: Secondary | ICD-10-CM | POA: Diagnosis not present

## 2020-03-23 DIAGNOSIS — Z87440 Personal history of urinary (tract) infections: Secondary | ICD-10-CM | POA: Diagnosis not present

## 2020-03-23 DIAGNOSIS — G8929 Other chronic pain: Secondary | ICD-10-CM | POA: Diagnosis not present

## 2020-03-23 DIAGNOSIS — Z4800 Encounter for change or removal of nonsurgical wound dressing: Secondary | ICD-10-CM | POA: Diagnosis not present

## 2020-03-23 DIAGNOSIS — I872 Venous insufficiency (chronic) (peripheral): Secondary | ICD-10-CM | POA: Diagnosis not present

## 2020-03-23 DIAGNOSIS — I5033 Acute on chronic diastolic (congestive) heart failure: Secondary | ICD-10-CM | POA: Diagnosis not present

## 2020-03-23 DIAGNOSIS — E1151 Type 2 diabetes mellitus with diabetic peripheral angiopathy without gangrene: Secondary | ICD-10-CM | POA: Diagnosis not present

## 2020-03-23 DIAGNOSIS — L400 Psoriasis vulgaris: Secondary | ICD-10-CM | POA: Diagnosis not present

## 2020-03-23 DIAGNOSIS — I110 Hypertensive heart disease with heart failure: Secondary | ICD-10-CM | POA: Diagnosis not present

## 2020-03-23 DIAGNOSIS — Z9181 History of falling: Secondary | ICD-10-CM | POA: Diagnosis not present

## 2020-03-23 DIAGNOSIS — L97211 Non-pressure chronic ulcer of right calf limited to breakdown of skin: Secondary | ICD-10-CM | POA: Diagnosis not present

## 2020-03-23 DIAGNOSIS — Z794 Long term (current) use of insulin: Secondary | ICD-10-CM | POA: Diagnosis not present

## 2020-03-23 DIAGNOSIS — C189 Malignant neoplasm of colon, unspecified: Secondary | ICD-10-CM | POA: Diagnosis not present

## 2020-03-23 DIAGNOSIS — K5792 Diverticulitis of intestine, part unspecified, without perforation or abscess without bleeding: Secondary | ICD-10-CM | POA: Diagnosis not present

## 2020-03-26 DIAGNOSIS — I5033 Acute on chronic diastolic (congestive) heart failure: Secondary | ICD-10-CM | POA: Diagnosis not present

## 2020-03-26 DIAGNOSIS — Z87440 Personal history of urinary (tract) infections: Secondary | ICD-10-CM | POA: Diagnosis not present

## 2020-03-26 DIAGNOSIS — C189 Malignant neoplasm of colon, unspecified: Secondary | ICD-10-CM | POA: Diagnosis not present

## 2020-03-26 DIAGNOSIS — I872 Venous insufficiency (chronic) (peripheral): Secondary | ICD-10-CM | POA: Diagnosis not present

## 2020-03-26 DIAGNOSIS — Z794 Long term (current) use of insulin: Secondary | ICD-10-CM | POA: Diagnosis not present

## 2020-03-26 DIAGNOSIS — L400 Psoriasis vulgaris: Secondary | ICD-10-CM | POA: Diagnosis not present

## 2020-03-26 DIAGNOSIS — L97211 Non-pressure chronic ulcer of right calf limited to breakdown of skin: Secondary | ICD-10-CM | POA: Diagnosis not present

## 2020-03-26 DIAGNOSIS — Z4800 Encounter for change or removal of nonsurgical wound dressing: Secondary | ICD-10-CM | POA: Diagnosis not present

## 2020-03-26 DIAGNOSIS — Z9181 History of falling: Secondary | ICD-10-CM | POA: Diagnosis not present

## 2020-03-26 DIAGNOSIS — E1151 Type 2 diabetes mellitus with diabetic peripheral angiopathy without gangrene: Secondary | ICD-10-CM | POA: Diagnosis not present

## 2020-03-26 DIAGNOSIS — I110 Hypertensive heart disease with heart failure: Secondary | ICD-10-CM | POA: Diagnosis not present

## 2020-03-26 DIAGNOSIS — K5792 Diverticulitis of intestine, part unspecified, without perforation or abscess without bleeding: Secondary | ICD-10-CM | POA: Diagnosis not present

## 2020-03-26 DIAGNOSIS — G8929 Other chronic pain: Secondary | ICD-10-CM | POA: Diagnosis not present

## 2020-03-29 DIAGNOSIS — Z4800 Encounter for change or removal of nonsurgical wound dressing: Secondary | ICD-10-CM | POA: Diagnosis not present

## 2020-03-29 DIAGNOSIS — K5792 Diverticulitis of intestine, part unspecified, without perforation or abscess without bleeding: Secondary | ICD-10-CM | POA: Diagnosis not present

## 2020-03-29 DIAGNOSIS — Z9181 History of falling: Secondary | ICD-10-CM | POA: Diagnosis not present

## 2020-03-29 DIAGNOSIS — I5033 Acute on chronic diastolic (congestive) heart failure: Secondary | ICD-10-CM | POA: Diagnosis not present

## 2020-03-29 DIAGNOSIS — E1151 Type 2 diabetes mellitus with diabetic peripheral angiopathy without gangrene: Secondary | ICD-10-CM | POA: Diagnosis not present

## 2020-03-29 DIAGNOSIS — L97211 Non-pressure chronic ulcer of right calf limited to breakdown of skin: Secondary | ICD-10-CM | POA: Diagnosis not present

## 2020-03-29 DIAGNOSIS — Z87440 Personal history of urinary (tract) infections: Secondary | ICD-10-CM | POA: Diagnosis not present

## 2020-03-29 DIAGNOSIS — Z794 Long term (current) use of insulin: Secondary | ICD-10-CM | POA: Diagnosis not present

## 2020-03-29 DIAGNOSIS — L400 Psoriasis vulgaris: Secondary | ICD-10-CM | POA: Diagnosis not present

## 2020-03-29 DIAGNOSIS — G8929 Other chronic pain: Secondary | ICD-10-CM | POA: Diagnosis not present

## 2020-03-29 DIAGNOSIS — I872 Venous insufficiency (chronic) (peripheral): Secondary | ICD-10-CM | POA: Diagnosis not present

## 2020-03-29 DIAGNOSIS — C189 Malignant neoplasm of colon, unspecified: Secondary | ICD-10-CM | POA: Diagnosis not present

## 2020-03-29 DIAGNOSIS — I110 Hypertensive heart disease with heart failure: Secondary | ICD-10-CM | POA: Diagnosis not present

## 2020-04-02 DIAGNOSIS — C189 Malignant neoplasm of colon, unspecified: Secondary | ICD-10-CM | POA: Diagnosis not present

## 2020-04-02 DIAGNOSIS — I872 Venous insufficiency (chronic) (peripheral): Secondary | ICD-10-CM | POA: Diagnosis not present

## 2020-04-02 DIAGNOSIS — Z794 Long term (current) use of insulin: Secondary | ICD-10-CM | POA: Diagnosis not present

## 2020-04-02 DIAGNOSIS — I110 Hypertensive heart disease with heart failure: Secondary | ICD-10-CM | POA: Diagnosis not present

## 2020-04-02 DIAGNOSIS — Z4800 Encounter for change or removal of nonsurgical wound dressing: Secondary | ICD-10-CM | POA: Diagnosis not present

## 2020-04-02 DIAGNOSIS — Z87440 Personal history of urinary (tract) infections: Secondary | ICD-10-CM | POA: Diagnosis not present

## 2020-04-02 DIAGNOSIS — E1151 Type 2 diabetes mellitus with diabetic peripheral angiopathy without gangrene: Secondary | ICD-10-CM | POA: Diagnosis not present

## 2020-04-02 DIAGNOSIS — L400 Psoriasis vulgaris: Secondary | ICD-10-CM | POA: Diagnosis not present

## 2020-04-02 DIAGNOSIS — I5033 Acute on chronic diastolic (congestive) heart failure: Secondary | ICD-10-CM | POA: Diagnosis not present

## 2020-04-02 DIAGNOSIS — K5792 Diverticulitis of intestine, part unspecified, without perforation or abscess without bleeding: Secondary | ICD-10-CM | POA: Diagnosis not present

## 2020-04-02 DIAGNOSIS — G8929 Other chronic pain: Secondary | ICD-10-CM | POA: Diagnosis not present

## 2020-04-02 DIAGNOSIS — Z9181 History of falling: Secondary | ICD-10-CM | POA: Diagnosis not present

## 2020-04-02 DIAGNOSIS — L97211 Non-pressure chronic ulcer of right calf limited to breakdown of skin: Secondary | ICD-10-CM | POA: Diagnosis not present

## 2020-04-05 DIAGNOSIS — Z87440 Personal history of urinary (tract) infections: Secondary | ICD-10-CM | POA: Diagnosis not present

## 2020-04-05 DIAGNOSIS — C189 Malignant neoplasm of colon, unspecified: Secondary | ICD-10-CM | POA: Diagnosis not present

## 2020-04-05 DIAGNOSIS — Z9181 History of falling: Secondary | ICD-10-CM | POA: Diagnosis not present

## 2020-04-05 DIAGNOSIS — I110 Hypertensive heart disease with heart failure: Secondary | ICD-10-CM | POA: Diagnosis not present

## 2020-04-05 DIAGNOSIS — G8929 Other chronic pain: Secondary | ICD-10-CM | POA: Diagnosis not present

## 2020-04-05 DIAGNOSIS — I5033 Acute on chronic diastolic (congestive) heart failure: Secondary | ICD-10-CM | POA: Diagnosis not present

## 2020-04-05 DIAGNOSIS — L400 Psoriasis vulgaris: Secondary | ICD-10-CM | POA: Diagnosis not present

## 2020-04-05 DIAGNOSIS — L97211 Non-pressure chronic ulcer of right calf limited to breakdown of skin: Secondary | ICD-10-CM | POA: Diagnosis not present

## 2020-04-05 DIAGNOSIS — E1151 Type 2 diabetes mellitus with diabetic peripheral angiopathy without gangrene: Secondary | ICD-10-CM | POA: Diagnosis not present

## 2020-04-05 DIAGNOSIS — I872 Venous insufficiency (chronic) (peripheral): Secondary | ICD-10-CM | POA: Diagnosis not present

## 2020-04-05 DIAGNOSIS — Z794 Long term (current) use of insulin: Secondary | ICD-10-CM | POA: Diagnosis not present

## 2020-04-05 DIAGNOSIS — K5792 Diverticulitis of intestine, part unspecified, without perforation or abscess without bleeding: Secondary | ICD-10-CM | POA: Diagnosis not present

## 2020-04-05 DIAGNOSIS — Z4800 Encounter for change or removal of nonsurgical wound dressing: Secondary | ICD-10-CM | POA: Diagnosis not present

## 2020-04-09 DIAGNOSIS — K5792 Diverticulitis of intestine, part unspecified, without perforation or abscess without bleeding: Secondary | ICD-10-CM | POA: Diagnosis not present

## 2020-04-09 DIAGNOSIS — I5033 Acute on chronic diastolic (congestive) heart failure: Secondary | ICD-10-CM | POA: Diagnosis not present

## 2020-04-09 DIAGNOSIS — Z9181 History of falling: Secondary | ICD-10-CM | POA: Diagnosis not present

## 2020-04-09 DIAGNOSIS — I872 Venous insufficiency (chronic) (peripheral): Secondary | ICD-10-CM | POA: Diagnosis not present

## 2020-04-09 DIAGNOSIS — I110 Hypertensive heart disease with heart failure: Secondary | ICD-10-CM | POA: Diagnosis not present

## 2020-04-09 DIAGNOSIS — Z87440 Personal history of urinary (tract) infections: Secondary | ICD-10-CM | POA: Diagnosis not present

## 2020-04-09 DIAGNOSIS — L400 Psoriasis vulgaris: Secondary | ICD-10-CM | POA: Diagnosis not present

## 2020-04-09 DIAGNOSIS — E1151 Type 2 diabetes mellitus with diabetic peripheral angiopathy without gangrene: Secondary | ICD-10-CM | POA: Diagnosis not present

## 2020-04-09 DIAGNOSIS — G8929 Other chronic pain: Secondary | ICD-10-CM | POA: Diagnosis not present

## 2020-04-09 DIAGNOSIS — Z4800 Encounter for change or removal of nonsurgical wound dressing: Secondary | ICD-10-CM | POA: Diagnosis not present

## 2020-04-09 DIAGNOSIS — Z794 Long term (current) use of insulin: Secondary | ICD-10-CM | POA: Diagnosis not present

## 2020-04-09 DIAGNOSIS — C189 Malignant neoplasm of colon, unspecified: Secondary | ICD-10-CM | POA: Diagnosis not present

## 2020-04-09 DIAGNOSIS — L97211 Non-pressure chronic ulcer of right calf limited to breakdown of skin: Secondary | ICD-10-CM | POA: Diagnosis not present

## 2020-04-12 DIAGNOSIS — I110 Hypertensive heart disease with heart failure: Secondary | ICD-10-CM | POA: Diagnosis not present

## 2020-04-12 DIAGNOSIS — K5792 Diverticulitis of intestine, part unspecified, without perforation or abscess without bleeding: Secondary | ICD-10-CM | POA: Diagnosis not present

## 2020-04-12 DIAGNOSIS — I872 Venous insufficiency (chronic) (peripheral): Secondary | ICD-10-CM | POA: Diagnosis not present

## 2020-04-12 DIAGNOSIS — I5033 Acute on chronic diastolic (congestive) heart failure: Secondary | ICD-10-CM | POA: Diagnosis not present

## 2020-04-12 DIAGNOSIS — L97211 Non-pressure chronic ulcer of right calf limited to breakdown of skin: Secondary | ICD-10-CM | POA: Diagnosis not present

## 2020-04-12 DIAGNOSIS — L400 Psoriasis vulgaris: Secondary | ICD-10-CM | POA: Diagnosis not present

## 2020-04-12 DIAGNOSIS — Z794 Long term (current) use of insulin: Secondary | ICD-10-CM | POA: Diagnosis not present

## 2020-04-12 DIAGNOSIS — Z9181 History of falling: Secondary | ICD-10-CM | POA: Diagnosis not present

## 2020-04-12 DIAGNOSIS — Z4800 Encounter for change or removal of nonsurgical wound dressing: Secondary | ICD-10-CM | POA: Diagnosis not present

## 2020-04-12 DIAGNOSIS — Z87440 Personal history of urinary (tract) infections: Secondary | ICD-10-CM | POA: Diagnosis not present

## 2020-04-12 DIAGNOSIS — G8929 Other chronic pain: Secondary | ICD-10-CM | POA: Diagnosis not present

## 2020-04-12 DIAGNOSIS — E1151 Type 2 diabetes mellitus with diabetic peripheral angiopathy without gangrene: Secondary | ICD-10-CM | POA: Diagnosis not present

## 2020-04-12 DIAGNOSIS — C189 Malignant neoplasm of colon, unspecified: Secondary | ICD-10-CM | POA: Diagnosis not present

## 2020-04-16 DIAGNOSIS — I5033 Acute on chronic diastolic (congestive) heart failure: Secondary | ICD-10-CM | POA: Diagnosis not present

## 2020-04-16 DIAGNOSIS — Z9181 History of falling: Secondary | ICD-10-CM | POA: Diagnosis not present

## 2020-04-16 DIAGNOSIS — G8929 Other chronic pain: Secondary | ICD-10-CM | POA: Diagnosis not present

## 2020-04-16 DIAGNOSIS — K5792 Diverticulitis of intestine, part unspecified, without perforation or abscess without bleeding: Secondary | ICD-10-CM | POA: Diagnosis not present

## 2020-04-16 DIAGNOSIS — L400 Psoriasis vulgaris: Secondary | ICD-10-CM | POA: Diagnosis not present

## 2020-04-16 DIAGNOSIS — I872 Venous insufficiency (chronic) (peripheral): Secondary | ICD-10-CM | POA: Diagnosis not present

## 2020-04-16 DIAGNOSIS — L97211 Non-pressure chronic ulcer of right calf limited to breakdown of skin: Secondary | ICD-10-CM | POA: Diagnosis not present

## 2020-04-16 DIAGNOSIS — E1151 Type 2 diabetes mellitus with diabetic peripheral angiopathy without gangrene: Secondary | ICD-10-CM | POA: Diagnosis not present

## 2020-04-16 DIAGNOSIS — Z794 Long term (current) use of insulin: Secondary | ICD-10-CM | POA: Diagnosis not present

## 2020-04-16 DIAGNOSIS — C189 Malignant neoplasm of colon, unspecified: Secondary | ICD-10-CM | POA: Diagnosis not present

## 2020-04-16 DIAGNOSIS — Z87440 Personal history of urinary (tract) infections: Secondary | ICD-10-CM | POA: Diagnosis not present

## 2020-04-16 DIAGNOSIS — I110 Hypertensive heart disease with heart failure: Secondary | ICD-10-CM | POA: Diagnosis not present

## 2020-04-16 DIAGNOSIS — Z4800 Encounter for change or removal of nonsurgical wound dressing: Secondary | ICD-10-CM | POA: Diagnosis not present

## 2020-04-19 DIAGNOSIS — Z794 Long term (current) use of insulin: Secondary | ICD-10-CM | POA: Diagnosis not present

## 2020-04-19 DIAGNOSIS — L400 Psoriasis vulgaris: Secondary | ICD-10-CM | POA: Diagnosis not present

## 2020-04-19 DIAGNOSIS — G8929 Other chronic pain: Secondary | ICD-10-CM | POA: Diagnosis not present

## 2020-04-19 DIAGNOSIS — C189 Malignant neoplasm of colon, unspecified: Secondary | ICD-10-CM | POA: Diagnosis not present

## 2020-04-19 DIAGNOSIS — Z9181 History of falling: Secondary | ICD-10-CM | POA: Diagnosis not present

## 2020-04-19 DIAGNOSIS — I110 Hypertensive heart disease with heart failure: Secondary | ICD-10-CM | POA: Diagnosis not present

## 2020-04-19 DIAGNOSIS — I5033 Acute on chronic diastolic (congestive) heart failure: Secondary | ICD-10-CM | POA: Diagnosis not present

## 2020-04-19 DIAGNOSIS — Z4800 Encounter for change or removal of nonsurgical wound dressing: Secondary | ICD-10-CM | POA: Diagnosis not present

## 2020-04-19 DIAGNOSIS — L97211 Non-pressure chronic ulcer of right calf limited to breakdown of skin: Secondary | ICD-10-CM | POA: Diagnosis not present

## 2020-04-19 DIAGNOSIS — E1151 Type 2 diabetes mellitus with diabetic peripheral angiopathy without gangrene: Secondary | ICD-10-CM | POA: Diagnosis not present

## 2020-04-19 DIAGNOSIS — I872 Venous insufficiency (chronic) (peripheral): Secondary | ICD-10-CM | POA: Diagnosis not present

## 2020-04-19 DIAGNOSIS — Z87440 Personal history of urinary (tract) infections: Secondary | ICD-10-CM | POA: Diagnosis not present

## 2020-04-19 DIAGNOSIS — K5792 Diverticulitis of intestine, part unspecified, without perforation or abscess without bleeding: Secondary | ICD-10-CM | POA: Diagnosis not present

## 2020-04-20 DIAGNOSIS — I10 Essential (primary) hypertension: Secondary | ICD-10-CM | POA: Diagnosis not present

## 2020-04-20 DIAGNOSIS — I5032 Chronic diastolic (congestive) heart failure: Secondary | ICD-10-CM | POA: Diagnosis not present

## 2020-04-20 DIAGNOSIS — E119 Type 2 diabetes mellitus without complications: Secondary | ICD-10-CM | POA: Diagnosis not present

## 2020-04-20 DIAGNOSIS — E785 Hyperlipidemia, unspecified: Secondary | ICD-10-CM | POA: Diagnosis not present

## 2020-04-24 DIAGNOSIS — Z4800 Encounter for change or removal of nonsurgical wound dressing: Secondary | ICD-10-CM | POA: Diagnosis not present

## 2020-04-24 DIAGNOSIS — I872 Venous insufficiency (chronic) (peripheral): Secondary | ICD-10-CM | POA: Diagnosis not present

## 2020-04-24 DIAGNOSIS — G8929 Other chronic pain: Secondary | ICD-10-CM | POA: Diagnosis not present

## 2020-04-24 DIAGNOSIS — K5792 Diverticulitis of intestine, part unspecified, without perforation or abscess without bleeding: Secondary | ICD-10-CM | POA: Diagnosis not present

## 2020-04-24 DIAGNOSIS — I110 Hypertensive heart disease with heart failure: Secondary | ICD-10-CM | POA: Diagnosis not present

## 2020-04-24 DIAGNOSIS — Z794 Long term (current) use of insulin: Secondary | ICD-10-CM | POA: Diagnosis not present

## 2020-04-24 DIAGNOSIS — L400 Psoriasis vulgaris: Secondary | ICD-10-CM | POA: Diagnosis not present

## 2020-04-24 DIAGNOSIS — E1151 Type 2 diabetes mellitus with diabetic peripheral angiopathy without gangrene: Secondary | ICD-10-CM | POA: Diagnosis not present

## 2020-04-24 DIAGNOSIS — Z87440 Personal history of urinary (tract) infections: Secondary | ICD-10-CM | POA: Diagnosis not present

## 2020-04-24 DIAGNOSIS — Z9181 History of falling: Secondary | ICD-10-CM | POA: Diagnosis not present

## 2020-04-24 DIAGNOSIS — L97211 Non-pressure chronic ulcer of right calf limited to breakdown of skin: Secondary | ICD-10-CM | POA: Diagnosis not present

## 2020-04-24 DIAGNOSIS — I5033 Acute on chronic diastolic (congestive) heart failure: Secondary | ICD-10-CM | POA: Diagnosis not present

## 2020-04-24 DIAGNOSIS — C189 Malignant neoplasm of colon, unspecified: Secondary | ICD-10-CM | POA: Diagnosis not present

## 2020-04-27 DIAGNOSIS — L400 Psoriasis vulgaris: Secondary | ICD-10-CM | POA: Diagnosis not present

## 2020-04-27 DIAGNOSIS — I110 Hypertensive heart disease with heart failure: Secondary | ICD-10-CM | POA: Diagnosis not present

## 2020-04-27 DIAGNOSIS — K5792 Diverticulitis of intestine, part unspecified, without perforation or abscess without bleeding: Secondary | ICD-10-CM | POA: Diagnosis not present

## 2020-04-27 DIAGNOSIS — Z87440 Personal history of urinary (tract) infections: Secondary | ICD-10-CM | POA: Diagnosis not present

## 2020-04-27 DIAGNOSIS — I872 Venous insufficiency (chronic) (peripheral): Secondary | ICD-10-CM | POA: Diagnosis not present

## 2020-04-27 DIAGNOSIS — C189 Malignant neoplasm of colon, unspecified: Secondary | ICD-10-CM | POA: Diagnosis not present

## 2020-04-27 DIAGNOSIS — I5033 Acute on chronic diastolic (congestive) heart failure: Secondary | ICD-10-CM | POA: Diagnosis not present

## 2020-04-27 DIAGNOSIS — G8929 Other chronic pain: Secondary | ICD-10-CM | POA: Diagnosis not present

## 2020-04-27 DIAGNOSIS — Z794 Long term (current) use of insulin: Secondary | ICD-10-CM | POA: Diagnosis not present

## 2020-04-27 DIAGNOSIS — E1151 Type 2 diabetes mellitus with diabetic peripheral angiopathy without gangrene: Secondary | ICD-10-CM | POA: Diagnosis not present

## 2020-04-27 DIAGNOSIS — Z4800 Encounter for change or removal of nonsurgical wound dressing: Secondary | ICD-10-CM | POA: Diagnosis not present

## 2020-04-27 DIAGNOSIS — L97211 Non-pressure chronic ulcer of right calf limited to breakdown of skin: Secondary | ICD-10-CM | POA: Diagnosis not present

## 2020-04-27 DIAGNOSIS — Z9181 History of falling: Secondary | ICD-10-CM | POA: Diagnosis not present

## 2020-04-30 DIAGNOSIS — C189 Malignant neoplasm of colon, unspecified: Secondary | ICD-10-CM | POA: Diagnosis not present

## 2020-04-30 DIAGNOSIS — Z4800 Encounter for change or removal of nonsurgical wound dressing: Secondary | ICD-10-CM | POA: Diagnosis not present

## 2020-04-30 DIAGNOSIS — E1151 Type 2 diabetes mellitus with diabetic peripheral angiopathy without gangrene: Secondary | ICD-10-CM | POA: Diagnosis not present

## 2020-04-30 DIAGNOSIS — K5792 Diverticulitis of intestine, part unspecified, without perforation or abscess without bleeding: Secondary | ICD-10-CM | POA: Diagnosis not present

## 2020-04-30 DIAGNOSIS — Z794 Long term (current) use of insulin: Secondary | ICD-10-CM | POA: Diagnosis not present

## 2020-04-30 DIAGNOSIS — G8929 Other chronic pain: Secondary | ICD-10-CM | POA: Diagnosis not present

## 2020-04-30 DIAGNOSIS — L97211 Non-pressure chronic ulcer of right calf limited to breakdown of skin: Secondary | ICD-10-CM | POA: Diagnosis not present

## 2020-04-30 DIAGNOSIS — I110 Hypertensive heart disease with heart failure: Secondary | ICD-10-CM | POA: Diagnosis not present

## 2020-04-30 DIAGNOSIS — L400 Psoriasis vulgaris: Secondary | ICD-10-CM | POA: Diagnosis not present

## 2020-04-30 DIAGNOSIS — Z9181 History of falling: Secondary | ICD-10-CM | POA: Diagnosis not present

## 2020-04-30 DIAGNOSIS — I5033 Acute on chronic diastolic (congestive) heart failure: Secondary | ICD-10-CM | POA: Diagnosis not present

## 2020-04-30 DIAGNOSIS — I872 Venous insufficiency (chronic) (peripheral): Secondary | ICD-10-CM | POA: Diagnosis not present

## 2020-04-30 DIAGNOSIS — Z87440 Personal history of urinary (tract) infections: Secondary | ICD-10-CM | POA: Diagnosis not present

## 2020-05-02 DIAGNOSIS — E1151 Type 2 diabetes mellitus with diabetic peripheral angiopathy without gangrene: Secondary | ICD-10-CM | POA: Diagnosis not present

## 2020-05-02 DIAGNOSIS — L97211 Non-pressure chronic ulcer of right calf limited to breakdown of skin: Secondary | ICD-10-CM | POA: Diagnosis not present

## 2020-05-02 DIAGNOSIS — Z4800 Encounter for change or removal of nonsurgical wound dressing: Secondary | ICD-10-CM | POA: Diagnosis not present

## 2020-05-02 DIAGNOSIS — I872 Venous insufficiency (chronic) (peripheral): Secondary | ICD-10-CM | POA: Diagnosis not present

## 2020-05-03 DIAGNOSIS — Z4800 Encounter for change or removal of nonsurgical wound dressing: Secondary | ICD-10-CM | POA: Diagnosis not present

## 2020-05-03 DIAGNOSIS — I5033 Acute on chronic diastolic (congestive) heart failure: Secondary | ICD-10-CM | POA: Diagnosis not present

## 2020-05-03 DIAGNOSIS — C189 Malignant neoplasm of colon, unspecified: Secondary | ICD-10-CM | POA: Diagnosis not present

## 2020-05-03 DIAGNOSIS — Z9181 History of falling: Secondary | ICD-10-CM | POA: Diagnosis not present

## 2020-05-03 DIAGNOSIS — L97211 Non-pressure chronic ulcer of right calf limited to breakdown of skin: Secondary | ICD-10-CM | POA: Diagnosis not present

## 2020-05-03 DIAGNOSIS — G8929 Other chronic pain: Secondary | ICD-10-CM | POA: Diagnosis not present

## 2020-05-03 DIAGNOSIS — I110 Hypertensive heart disease with heart failure: Secondary | ICD-10-CM | POA: Diagnosis not present

## 2020-05-03 DIAGNOSIS — Z794 Long term (current) use of insulin: Secondary | ICD-10-CM | POA: Diagnosis not present

## 2020-05-03 DIAGNOSIS — I872 Venous insufficiency (chronic) (peripheral): Secondary | ICD-10-CM | POA: Diagnosis not present

## 2020-05-03 DIAGNOSIS — Z87440 Personal history of urinary (tract) infections: Secondary | ICD-10-CM | POA: Diagnosis not present

## 2020-05-03 DIAGNOSIS — L400 Psoriasis vulgaris: Secondary | ICD-10-CM | POA: Diagnosis not present

## 2020-05-03 DIAGNOSIS — K5792 Diverticulitis of intestine, part unspecified, without perforation or abscess without bleeding: Secondary | ICD-10-CM | POA: Diagnosis not present

## 2020-05-03 DIAGNOSIS — E1151 Type 2 diabetes mellitus with diabetic peripheral angiopathy without gangrene: Secondary | ICD-10-CM | POA: Diagnosis not present

## 2020-05-04 DIAGNOSIS — B351 Tinea unguium: Secondary | ICD-10-CM | POA: Diagnosis not present

## 2020-05-04 DIAGNOSIS — E119 Type 2 diabetes mellitus without complications: Secondary | ICD-10-CM | POA: Diagnosis not present

## 2020-05-07 DIAGNOSIS — Z87440 Personal history of urinary (tract) infections: Secondary | ICD-10-CM | POA: Diagnosis not present

## 2020-05-07 DIAGNOSIS — Z794 Long term (current) use of insulin: Secondary | ICD-10-CM | POA: Diagnosis not present

## 2020-05-07 DIAGNOSIS — L400 Psoriasis vulgaris: Secondary | ICD-10-CM | POA: Diagnosis not present

## 2020-05-07 DIAGNOSIS — L97211 Non-pressure chronic ulcer of right calf limited to breakdown of skin: Secondary | ICD-10-CM | POA: Diagnosis not present

## 2020-05-07 DIAGNOSIS — C189 Malignant neoplasm of colon, unspecified: Secondary | ICD-10-CM | POA: Diagnosis not present

## 2020-05-07 DIAGNOSIS — Z4800 Encounter for change or removal of nonsurgical wound dressing: Secondary | ICD-10-CM | POA: Diagnosis not present

## 2020-05-07 DIAGNOSIS — G8929 Other chronic pain: Secondary | ICD-10-CM | POA: Diagnosis not present

## 2020-05-07 DIAGNOSIS — I872 Venous insufficiency (chronic) (peripheral): Secondary | ICD-10-CM | POA: Diagnosis not present

## 2020-05-07 DIAGNOSIS — I110 Hypertensive heart disease with heart failure: Secondary | ICD-10-CM | POA: Diagnosis not present

## 2020-05-07 DIAGNOSIS — I5033 Acute on chronic diastolic (congestive) heart failure: Secondary | ICD-10-CM | POA: Diagnosis not present

## 2020-05-07 DIAGNOSIS — Z9181 History of falling: Secondary | ICD-10-CM | POA: Diagnosis not present

## 2020-05-07 DIAGNOSIS — E1151 Type 2 diabetes mellitus with diabetic peripheral angiopathy without gangrene: Secondary | ICD-10-CM | POA: Diagnosis not present

## 2020-05-07 DIAGNOSIS — K5792 Diverticulitis of intestine, part unspecified, without perforation or abscess without bleeding: Secondary | ICD-10-CM | POA: Diagnosis not present

## 2020-05-09 DIAGNOSIS — L97219 Non-pressure chronic ulcer of right calf with unspecified severity: Secondary | ICD-10-CM | POA: Diagnosis not present

## 2020-05-10 DIAGNOSIS — Z87440 Personal history of urinary (tract) infections: Secondary | ICD-10-CM | POA: Diagnosis not present

## 2020-05-10 DIAGNOSIS — Z794 Long term (current) use of insulin: Secondary | ICD-10-CM | POA: Diagnosis not present

## 2020-05-10 DIAGNOSIS — K5792 Diverticulitis of intestine, part unspecified, without perforation or abscess without bleeding: Secondary | ICD-10-CM | POA: Diagnosis not present

## 2020-05-10 DIAGNOSIS — L97211 Non-pressure chronic ulcer of right calf limited to breakdown of skin: Secondary | ICD-10-CM | POA: Diagnosis not present

## 2020-05-10 DIAGNOSIS — G8929 Other chronic pain: Secondary | ICD-10-CM | POA: Diagnosis not present

## 2020-05-10 DIAGNOSIS — I110 Hypertensive heart disease with heart failure: Secondary | ICD-10-CM | POA: Diagnosis not present

## 2020-05-10 DIAGNOSIS — E1151 Type 2 diabetes mellitus with diabetic peripheral angiopathy without gangrene: Secondary | ICD-10-CM | POA: Diagnosis not present

## 2020-05-10 DIAGNOSIS — L400 Psoriasis vulgaris: Secondary | ICD-10-CM | POA: Diagnosis not present

## 2020-05-10 DIAGNOSIS — I5033 Acute on chronic diastolic (congestive) heart failure: Secondary | ICD-10-CM | POA: Diagnosis not present

## 2020-05-10 DIAGNOSIS — C189 Malignant neoplasm of colon, unspecified: Secondary | ICD-10-CM | POA: Diagnosis not present

## 2020-05-10 DIAGNOSIS — Z9181 History of falling: Secondary | ICD-10-CM | POA: Diagnosis not present

## 2020-05-10 DIAGNOSIS — Z4800 Encounter for change or removal of nonsurgical wound dressing: Secondary | ICD-10-CM | POA: Diagnosis not present

## 2020-05-10 DIAGNOSIS — I872 Venous insufficiency (chronic) (peripheral): Secondary | ICD-10-CM | POA: Diagnosis not present

## 2020-05-14 DIAGNOSIS — I5033 Acute on chronic diastolic (congestive) heart failure: Secondary | ICD-10-CM | POA: Diagnosis not present

## 2020-05-14 DIAGNOSIS — G8929 Other chronic pain: Secondary | ICD-10-CM | POA: Diagnosis not present

## 2020-05-14 DIAGNOSIS — I110 Hypertensive heart disease with heart failure: Secondary | ICD-10-CM | POA: Diagnosis not present

## 2020-05-14 DIAGNOSIS — Z794 Long term (current) use of insulin: Secondary | ICD-10-CM | POA: Diagnosis not present

## 2020-05-14 DIAGNOSIS — Z87440 Personal history of urinary (tract) infections: Secondary | ICD-10-CM | POA: Diagnosis not present

## 2020-05-14 DIAGNOSIS — Z9181 History of falling: Secondary | ICD-10-CM | POA: Diagnosis not present

## 2020-05-14 DIAGNOSIS — K5792 Diverticulitis of intestine, part unspecified, without perforation or abscess without bleeding: Secondary | ICD-10-CM | POA: Diagnosis not present

## 2020-05-14 DIAGNOSIS — Z4800 Encounter for change or removal of nonsurgical wound dressing: Secondary | ICD-10-CM | POA: Diagnosis not present

## 2020-05-14 DIAGNOSIS — L97211 Non-pressure chronic ulcer of right calf limited to breakdown of skin: Secondary | ICD-10-CM | POA: Diagnosis not present

## 2020-05-14 DIAGNOSIS — C189 Malignant neoplasm of colon, unspecified: Secondary | ICD-10-CM | POA: Diagnosis not present

## 2020-05-14 DIAGNOSIS — E1151 Type 2 diabetes mellitus with diabetic peripheral angiopathy without gangrene: Secondary | ICD-10-CM | POA: Diagnosis not present

## 2020-05-14 DIAGNOSIS — I872 Venous insufficiency (chronic) (peripheral): Secondary | ICD-10-CM | POA: Diagnosis not present

## 2020-05-14 DIAGNOSIS — L400 Psoriasis vulgaris: Secondary | ICD-10-CM | POA: Diagnosis not present

## 2020-05-17 DIAGNOSIS — G8929 Other chronic pain: Secondary | ICD-10-CM | POA: Diagnosis not present

## 2020-05-17 DIAGNOSIS — Z9181 History of falling: Secondary | ICD-10-CM | POA: Diagnosis not present

## 2020-05-17 DIAGNOSIS — L97211 Non-pressure chronic ulcer of right calf limited to breakdown of skin: Secondary | ICD-10-CM | POA: Diagnosis not present

## 2020-05-17 DIAGNOSIS — K5792 Diverticulitis of intestine, part unspecified, without perforation or abscess without bleeding: Secondary | ICD-10-CM | POA: Diagnosis not present

## 2020-05-17 DIAGNOSIS — C189 Malignant neoplasm of colon, unspecified: Secondary | ICD-10-CM | POA: Diagnosis not present

## 2020-05-17 DIAGNOSIS — E1151 Type 2 diabetes mellitus with diabetic peripheral angiopathy without gangrene: Secondary | ICD-10-CM | POA: Diagnosis not present

## 2020-05-17 DIAGNOSIS — Z87440 Personal history of urinary (tract) infections: Secondary | ICD-10-CM | POA: Diagnosis not present

## 2020-05-17 DIAGNOSIS — I110 Hypertensive heart disease with heart failure: Secondary | ICD-10-CM | POA: Diagnosis not present

## 2020-05-17 DIAGNOSIS — L400 Psoriasis vulgaris: Secondary | ICD-10-CM | POA: Diagnosis not present

## 2020-05-17 DIAGNOSIS — I872 Venous insufficiency (chronic) (peripheral): Secondary | ICD-10-CM | POA: Diagnosis not present

## 2020-05-17 DIAGNOSIS — Z4800 Encounter for change or removal of nonsurgical wound dressing: Secondary | ICD-10-CM | POA: Diagnosis not present

## 2020-05-17 DIAGNOSIS — Z794 Long term (current) use of insulin: Secondary | ICD-10-CM | POA: Diagnosis not present

## 2020-05-17 DIAGNOSIS — I5033 Acute on chronic diastolic (congestive) heart failure: Secondary | ICD-10-CM | POA: Diagnosis not present

## 2020-05-21 DIAGNOSIS — C189 Malignant neoplasm of colon, unspecified: Secondary | ICD-10-CM | POA: Diagnosis not present

## 2020-05-21 DIAGNOSIS — G8929 Other chronic pain: Secondary | ICD-10-CM | POA: Diagnosis not present

## 2020-05-21 DIAGNOSIS — Z794 Long term (current) use of insulin: Secondary | ICD-10-CM | POA: Diagnosis not present

## 2020-05-21 DIAGNOSIS — Z4800 Encounter for change or removal of nonsurgical wound dressing: Secondary | ICD-10-CM | POA: Diagnosis not present

## 2020-05-21 DIAGNOSIS — I872 Venous insufficiency (chronic) (peripheral): Secondary | ICD-10-CM | POA: Diagnosis not present

## 2020-05-21 DIAGNOSIS — Z87440 Personal history of urinary (tract) infections: Secondary | ICD-10-CM | POA: Diagnosis not present

## 2020-05-21 DIAGNOSIS — K5792 Diverticulitis of intestine, part unspecified, without perforation or abscess without bleeding: Secondary | ICD-10-CM | POA: Diagnosis not present

## 2020-05-21 DIAGNOSIS — I110 Hypertensive heart disease with heart failure: Secondary | ICD-10-CM | POA: Diagnosis not present

## 2020-05-21 DIAGNOSIS — L400 Psoriasis vulgaris: Secondary | ICD-10-CM | POA: Diagnosis not present

## 2020-05-21 DIAGNOSIS — Z9181 History of falling: Secondary | ICD-10-CM | POA: Diagnosis not present

## 2020-05-21 DIAGNOSIS — I5033 Acute on chronic diastolic (congestive) heart failure: Secondary | ICD-10-CM | POA: Diagnosis not present

## 2020-05-21 DIAGNOSIS — E1151 Type 2 diabetes mellitus with diabetic peripheral angiopathy without gangrene: Secondary | ICD-10-CM | POA: Diagnosis not present

## 2020-05-21 DIAGNOSIS — L97211 Non-pressure chronic ulcer of right calf limited to breakdown of skin: Secondary | ICD-10-CM | POA: Diagnosis not present

## 2020-05-22 DIAGNOSIS — E119 Type 2 diabetes mellitus without complications: Secondary | ICD-10-CM | POA: Diagnosis not present

## 2020-05-22 DIAGNOSIS — I5032 Chronic diastolic (congestive) heart failure: Secondary | ICD-10-CM | POA: Diagnosis not present

## 2020-05-22 DIAGNOSIS — E559 Vitamin D deficiency, unspecified: Secondary | ICD-10-CM | POA: Diagnosis not present

## 2020-05-22 DIAGNOSIS — E785 Hyperlipidemia, unspecified: Secondary | ICD-10-CM | POA: Diagnosis not present

## 2020-05-24 DIAGNOSIS — L400 Psoriasis vulgaris: Secondary | ICD-10-CM | POA: Diagnosis not present

## 2020-05-24 DIAGNOSIS — Z87440 Personal history of urinary (tract) infections: Secondary | ICD-10-CM | POA: Diagnosis not present

## 2020-05-24 DIAGNOSIS — G8929 Other chronic pain: Secondary | ICD-10-CM | POA: Diagnosis not present

## 2020-05-24 DIAGNOSIS — I110 Hypertensive heart disease with heart failure: Secondary | ICD-10-CM | POA: Diagnosis not present

## 2020-05-24 DIAGNOSIS — E1151 Type 2 diabetes mellitus with diabetic peripheral angiopathy without gangrene: Secondary | ICD-10-CM | POA: Diagnosis not present

## 2020-05-24 DIAGNOSIS — Z4800 Encounter for change or removal of nonsurgical wound dressing: Secondary | ICD-10-CM | POA: Diagnosis not present

## 2020-05-24 DIAGNOSIS — Z794 Long term (current) use of insulin: Secondary | ICD-10-CM | POA: Diagnosis not present

## 2020-05-24 DIAGNOSIS — L97211 Non-pressure chronic ulcer of right calf limited to breakdown of skin: Secondary | ICD-10-CM | POA: Diagnosis not present

## 2020-05-24 DIAGNOSIS — K5792 Diverticulitis of intestine, part unspecified, without perforation or abscess without bleeding: Secondary | ICD-10-CM | POA: Diagnosis not present

## 2020-05-24 DIAGNOSIS — C189 Malignant neoplasm of colon, unspecified: Secondary | ICD-10-CM | POA: Diagnosis not present

## 2020-05-24 DIAGNOSIS — I5033 Acute on chronic diastolic (congestive) heart failure: Secondary | ICD-10-CM | POA: Diagnosis not present

## 2020-05-24 DIAGNOSIS — I872 Venous insufficiency (chronic) (peripheral): Secondary | ICD-10-CM | POA: Diagnosis not present

## 2020-05-24 DIAGNOSIS — Z9181 History of falling: Secondary | ICD-10-CM | POA: Diagnosis not present

## 2020-05-28 DIAGNOSIS — G8929 Other chronic pain: Secondary | ICD-10-CM | POA: Diagnosis not present

## 2020-05-28 DIAGNOSIS — L97211 Non-pressure chronic ulcer of right calf limited to breakdown of skin: Secondary | ICD-10-CM | POA: Diagnosis not present

## 2020-05-28 DIAGNOSIS — L400 Psoriasis vulgaris: Secondary | ICD-10-CM | POA: Diagnosis not present

## 2020-05-28 DIAGNOSIS — I872 Venous insufficiency (chronic) (peripheral): Secondary | ICD-10-CM | POA: Diagnosis not present

## 2020-05-28 DIAGNOSIS — Z87440 Personal history of urinary (tract) infections: Secondary | ICD-10-CM | POA: Diagnosis not present

## 2020-05-28 DIAGNOSIS — I110 Hypertensive heart disease with heart failure: Secondary | ICD-10-CM | POA: Diagnosis not present

## 2020-05-28 DIAGNOSIS — Z9181 History of falling: Secondary | ICD-10-CM | POA: Diagnosis not present

## 2020-05-28 DIAGNOSIS — Z4800 Encounter for change or removal of nonsurgical wound dressing: Secondary | ICD-10-CM | POA: Diagnosis not present

## 2020-05-28 DIAGNOSIS — C189 Malignant neoplasm of colon, unspecified: Secondary | ICD-10-CM | POA: Diagnosis not present

## 2020-05-28 DIAGNOSIS — K5792 Diverticulitis of intestine, part unspecified, without perforation or abscess without bleeding: Secondary | ICD-10-CM | POA: Diagnosis not present

## 2020-05-28 DIAGNOSIS — I5033 Acute on chronic diastolic (congestive) heart failure: Secondary | ICD-10-CM | POA: Diagnosis not present

## 2020-05-28 DIAGNOSIS — E1151 Type 2 diabetes mellitus with diabetic peripheral angiopathy without gangrene: Secondary | ICD-10-CM | POA: Diagnosis not present

## 2020-05-28 DIAGNOSIS — Z794 Long term (current) use of insulin: Secondary | ICD-10-CM | POA: Diagnosis not present

## 2020-06-01 DIAGNOSIS — L400 Psoriasis vulgaris: Secondary | ICD-10-CM | POA: Diagnosis not present

## 2020-06-01 DIAGNOSIS — Z794 Long term (current) use of insulin: Secondary | ICD-10-CM | POA: Diagnosis not present

## 2020-06-01 DIAGNOSIS — I5033 Acute on chronic diastolic (congestive) heart failure: Secondary | ICD-10-CM | POA: Diagnosis not present

## 2020-06-01 DIAGNOSIS — Z9181 History of falling: Secondary | ICD-10-CM | POA: Diagnosis not present

## 2020-06-01 DIAGNOSIS — L97211 Non-pressure chronic ulcer of right calf limited to breakdown of skin: Secondary | ICD-10-CM | POA: Diagnosis not present

## 2020-06-01 DIAGNOSIS — I872 Venous insufficiency (chronic) (peripheral): Secondary | ICD-10-CM | POA: Diagnosis not present

## 2020-06-01 DIAGNOSIS — C189 Malignant neoplasm of colon, unspecified: Secondary | ICD-10-CM | POA: Diagnosis not present

## 2020-06-01 DIAGNOSIS — Z4800 Encounter for change or removal of nonsurgical wound dressing: Secondary | ICD-10-CM | POA: Diagnosis not present

## 2020-06-01 DIAGNOSIS — G8929 Other chronic pain: Secondary | ICD-10-CM | POA: Diagnosis not present

## 2020-06-01 DIAGNOSIS — Z87440 Personal history of urinary (tract) infections: Secondary | ICD-10-CM | POA: Diagnosis not present

## 2020-06-01 DIAGNOSIS — K5792 Diverticulitis of intestine, part unspecified, without perforation or abscess without bleeding: Secondary | ICD-10-CM | POA: Diagnosis not present

## 2020-06-01 DIAGNOSIS — I110 Hypertensive heart disease with heart failure: Secondary | ICD-10-CM | POA: Diagnosis not present

## 2020-06-01 DIAGNOSIS — E1151 Type 2 diabetes mellitus with diabetic peripheral angiopathy without gangrene: Secondary | ICD-10-CM | POA: Diagnosis not present

## 2020-06-04 DIAGNOSIS — C189 Malignant neoplasm of colon, unspecified: Secondary | ICD-10-CM | POA: Diagnosis not present

## 2020-06-04 DIAGNOSIS — I110 Hypertensive heart disease with heart failure: Secondary | ICD-10-CM | POA: Diagnosis not present

## 2020-06-04 DIAGNOSIS — G8929 Other chronic pain: Secondary | ICD-10-CM | POA: Diagnosis not present

## 2020-06-04 DIAGNOSIS — K5792 Diverticulitis of intestine, part unspecified, without perforation or abscess without bleeding: Secondary | ICD-10-CM | POA: Diagnosis not present

## 2020-06-04 DIAGNOSIS — Z4800 Encounter for change or removal of nonsurgical wound dressing: Secondary | ICD-10-CM | POA: Diagnosis not present

## 2020-06-04 DIAGNOSIS — Z794 Long term (current) use of insulin: Secondary | ICD-10-CM | POA: Diagnosis not present

## 2020-06-04 DIAGNOSIS — Z9181 History of falling: Secondary | ICD-10-CM | POA: Diagnosis not present

## 2020-06-04 DIAGNOSIS — L97211 Non-pressure chronic ulcer of right calf limited to breakdown of skin: Secondary | ICD-10-CM | POA: Diagnosis not present

## 2020-06-04 DIAGNOSIS — E1151 Type 2 diabetes mellitus with diabetic peripheral angiopathy without gangrene: Secondary | ICD-10-CM | POA: Diagnosis not present

## 2020-06-04 DIAGNOSIS — I5033 Acute on chronic diastolic (congestive) heart failure: Secondary | ICD-10-CM | POA: Diagnosis not present

## 2020-06-04 DIAGNOSIS — I872 Venous insufficiency (chronic) (peripheral): Secondary | ICD-10-CM | POA: Diagnosis not present

## 2020-06-04 DIAGNOSIS — L400 Psoriasis vulgaris: Secondary | ICD-10-CM | POA: Diagnosis not present

## 2020-06-04 DIAGNOSIS — Z87440 Personal history of urinary (tract) infections: Secondary | ICD-10-CM | POA: Diagnosis not present

## 2020-06-08 DIAGNOSIS — K5792 Diverticulitis of intestine, part unspecified, without perforation or abscess without bleeding: Secondary | ICD-10-CM | POA: Diagnosis not present

## 2020-06-08 DIAGNOSIS — Z794 Long term (current) use of insulin: Secondary | ICD-10-CM | POA: Diagnosis not present

## 2020-06-08 DIAGNOSIS — Z4800 Encounter for change or removal of nonsurgical wound dressing: Secondary | ICD-10-CM | POA: Diagnosis not present

## 2020-06-08 DIAGNOSIS — C189 Malignant neoplasm of colon, unspecified: Secondary | ICD-10-CM | POA: Diagnosis not present

## 2020-06-08 DIAGNOSIS — Z9181 History of falling: Secondary | ICD-10-CM | POA: Diagnosis not present

## 2020-06-08 DIAGNOSIS — I5033 Acute on chronic diastolic (congestive) heart failure: Secondary | ICD-10-CM | POA: Diagnosis not present

## 2020-06-08 DIAGNOSIS — I110 Hypertensive heart disease with heart failure: Secondary | ICD-10-CM | POA: Diagnosis not present

## 2020-06-08 DIAGNOSIS — E1151 Type 2 diabetes mellitus with diabetic peripheral angiopathy without gangrene: Secondary | ICD-10-CM | POA: Diagnosis not present

## 2020-06-08 DIAGNOSIS — Z87440 Personal history of urinary (tract) infections: Secondary | ICD-10-CM | POA: Diagnosis not present

## 2020-06-08 DIAGNOSIS — I872 Venous insufficiency (chronic) (peripheral): Secondary | ICD-10-CM | POA: Diagnosis not present

## 2020-06-08 DIAGNOSIS — G8929 Other chronic pain: Secondary | ICD-10-CM | POA: Diagnosis not present

## 2020-06-08 DIAGNOSIS — L97211 Non-pressure chronic ulcer of right calf limited to breakdown of skin: Secondary | ICD-10-CM | POA: Diagnosis not present

## 2020-06-08 DIAGNOSIS — L400 Psoriasis vulgaris: Secondary | ICD-10-CM | POA: Diagnosis not present

## 2020-06-11 DIAGNOSIS — I10 Essential (primary) hypertension: Secondary | ICD-10-CM | POA: Diagnosis not present

## 2020-06-11 DIAGNOSIS — I5032 Chronic diastolic (congestive) heart failure: Secondary | ICD-10-CM | POA: Diagnosis not present

## 2020-06-11 DIAGNOSIS — I2510 Atherosclerotic heart disease of native coronary artery without angina pectoris: Secondary | ICD-10-CM | POA: Diagnosis not present

## 2020-06-11 DIAGNOSIS — E785 Hyperlipidemia, unspecified: Secondary | ICD-10-CM | POA: Diagnosis not present

## 2020-06-12 DIAGNOSIS — K5792 Diverticulitis of intestine, part unspecified, without perforation or abscess without bleeding: Secondary | ICD-10-CM | POA: Diagnosis not present

## 2020-06-12 DIAGNOSIS — E1151 Type 2 diabetes mellitus with diabetic peripheral angiopathy without gangrene: Secondary | ICD-10-CM | POA: Diagnosis not present

## 2020-06-12 DIAGNOSIS — C189 Malignant neoplasm of colon, unspecified: Secondary | ICD-10-CM | POA: Diagnosis not present

## 2020-06-12 DIAGNOSIS — L97211 Non-pressure chronic ulcer of right calf limited to breakdown of skin: Secondary | ICD-10-CM | POA: Diagnosis not present

## 2020-06-12 DIAGNOSIS — Z4800 Encounter for change or removal of nonsurgical wound dressing: Secondary | ICD-10-CM | POA: Diagnosis not present

## 2020-06-12 DIAGNOSIS — I110 Hypertensive heart disease with heart failure: Secondary | ICD-10-CM | POA: Diagnosis not present

## 2020-06-12 DIAGNOSIS — Z794 Long term (current) use of insulin: Secondary | ICD-10-CM | POA: Diagnosis not present

## 2020-06-12 DIAGNOSIS — L400 Psoriasis vulgaris: Secondary | ICD-10-CM | POA: Diagnosis not present

## 2020-06-12 DIAGNOSIS — G8929 Other chronic pain: Secondary | ICD-10-CM | POA: Diagnosis not present

## 2020-06-12 DIAGNOSIS — Z9181 History of falling: Secondary | ICD-10-CM | POA: Diagnosis not present

## 2020-06-12 DIAGNOSIS — I5033 Acute on chronic diastolic (congestive) heart failure: Secondary | ICD-10-CM | POA: Diagnosis not present

## 2020-06-12 DIAGNOSIS — I872 Venous insufficiency (chronic) (peripheral): Secondary | ICD-10-CM | POA: Diagnosis not present

## 2020-06-12 DIAGNOSIS — Z87440 Personal history of urinary (tract) infections: Secondary | ICD-10-CM | POA: Diagnosis not present

## 2020-06-13 ENCOUNTER — Ambulatory Visit: Payer: Self-pay

## 2020-06-13 ENCOUNTER — Encounter: Payer: Self-pay | Admitting: Orthopaedic Surgery

## 2020-06-13 ENCOUNTER — Ambulatory Visit (INDEPENDENT_AMBULATORY_CARE_PROVIDER_SITE_OTHER): Payer: Medicare Other | Admitting: Orthopaedic Surgery

## 2020-06-13 ENCOUNTER — Other Ambulatory Visit: Payer: Self-pay

## 2020-06-13 VITALS — Ht 61.0 in | Wt 167.0 lb

## 2020-06-13 DIAGNOSIS — M25561 Pain in right knee: Secondary | ICD-10-CM | POA: Diagnosis not present

## 2020-06-13 DIAGNOSIS — G8929 Other chronic pain: Secondary | ICD-10-CM | POA: Diagnosis not present

## 2020-06-13 DIAGNOSIS — M170 Bilateral primary osteoarthritis of knee: Secondary | ICD-10-CM

## 2020-06-13 MED ORDER — LIDOCAINE HCL 1 % IJ SOLN
2.0000 mL | INTRAMUSCULAR | Status: AC | PRN
  Administered 2020-06-13: 2 mL

## 2020-06-13 MED ORDER — BUPIVACAINE HCL 0.5 % IJ SOLN
2.0000 mL | INTRAMUSCULAR | Status: AC | PRN
  Administered 2020-06-13: 2 mL via INTRA_ARTICULAR

## 2020-06-13 NOTE — Progress Notes (Signed)
Office Visit Note   Patient: [name]           Date of Birth: [birth year]           MRN: [id number] Visit Date: 06/13/2020              Requested by: [name], MD [place],  [place] [address] PCP: [name], MD   Assessment & Plan: Visit Diagnoses:  1. Chronic pain of right knee   2. Bilateral primary osteoarthritis of knee     Plan: [name] is 86 years old and accompanied by a staff member from [facility] [place]. Apparently she has some age-related dementia. She has significant, end-stage osteoarthritis of her right knee. I have injected her knee with cortisone and will monitor her response.  Follow-Up Instructions: Return if symptoms worsen or fail to improve.   Orders:  Orders Placed This Encounter  Procedures  . Large Joint Inj: R knee  . XR KNEE 3 VIEW RIGHT   No orders of the defined types were placed in this encounter.     Procedures: Large Joint Inj: R knee on 06/13/2020 11:19 AM Indications: pain and diagnostic evaluation Details: 25 G 1.5 in needle, anteromedial approach  Arthrogram: No  Medications: 2 mL lidocaine 1 %; 2 mL bupivacaine 0.5 %  2 mL betamethasone injected into right knee with lidocaine and Marcaine Procedure, treatment alternatives, risks and benefits explained, specific risks discussed. Consent was given by the patient. Immediately prior to procedure a time out was called to verify the correct patient, procedure, equipment, support staff and site/side marked as required. Patient was prepped and draped in the usual sterile fashion.       Clinical Data: No additional findings.   Subjective: Chief Complaint  Patient presents with  . Right Knee - Pain  Patient presents today for her right knee. She said that it has hurt her for a long time. She is limited on her history intake, and is accompanied with someone that works at the [employer] she lives at. She said that her knee does  swell and give way. She walks with a walker for support. She takes Tylenol. She is diabetic. She states that she has had cortisone injections in the past and they helped.  Presently residing in a long term care facility and is accompanied by one of the staff members. She does have history of dementia.  HPI  Review of Systems   Objective: Vital Signs: Ht 5\' 1"  (1.549 m)   Wt 167 lb (75.8 kg)   BMI 31.55 kg/m   Physical Exam Constitutional:      Appearance: She is well-developed.  Eyes:     Pupils: Pupils are equal, round, and reactive to light.  Pulmonary:     Effort: Pulmonary effort is normal.  Skin:    General: Skin is warm and dry.  Neurological:     Mental Status: She is alert and oriented to person, place, and time.  Psychiatric:        Behavior: Behavior normal.     Ortho Exam awake and alert. Pleasant. Seems just minimally confused. Exam of the right knee reveals a about 10 degrees lack of full extension. Minimal effusion. Some minimal medial lateral joint pain and increased varus with weightbearing. Flexed about 100 degrees without instability. No popliteal pain or mass or calf discomfort. More medial than lateral joint pain. Some patella crepitation but no pain  with compression. Has some chronic skin issues and is being treated for some lower extremity lesions which I did not valuate as they were covered with a sterile gauze dressing  Specialty Comments:  No specialty comments available.  Imaging: XR KNEE 3 VIEW RIGHT  Result Date: 06/13/2020 Films of the right knee were obtained in three projections standing. There are advanced, end-stage osteoarthritis in all three compartments. There is at least 10 degrees of varus with complete collapse of the medial compartment associated with subchondral sclerosis and peripheral osteophytes. More changes medially than laterally but also significantly at the patellofemoral joint with large osteophytes. The patella tracks in the  midline. No obvious acute changes. Does have evidence of osteopenia with decreased bone density. Do not see CPPD    PMFS History: Patient Active Problem List   Diagnosis Date Noted  . Bilateral primary osteoarthritis of knee 06/13/2020  . Community acquired pneumonia of left lower lobe of lung   . UTI (urinary tract infection) 09/14/2019  . Pneumonia due to COVID-19 virus 07/22/2019  . Acute on chronic diastolic CHF (congestive heart failure) ([place]) 05/06/2017  . Altered mental status   . Acute respiratory failure ([place]) 05/04/2017  . Hypoglycemia 04/30/2017  . Acute urinary retention 02/23/2017  . Chronic diastolic CHF (congestive heart failure) ([place])   . SBO (small bowel obstruction) ([place]) 02/22/2017  . Hypertensive urgency 02/22/2017  . Leukocytosis 02/22/2017  . Hyponatremia 02/22/2017  . Dehydration 02/22/2017  . Dizziness 09/01/2016  . Weakness 09/01/2016  . Psoriasis vulgaris 09/01/2016  . Right hip pain 09/01/2016  . Diabetic foot ulcer ([place]) 09/01/2016  . Dyspnea 05/15/2016  . Congestive heart failure (CHF) ([place]) 05/15/2016  . Rash and nonspecific skin eruption 05/15/2016  . Essential hypertension 05/15/2016  . Uncontrolled type 2 diabetes mellitus with hyperglycemia ([place]) 05/15/2016  . Anxiety 05/15/2016  . Acute respiratory failure with hypoxia ([place]) 05/15/2016   Past Medical History:  Diagnosis Date  . Anxiety   . Arthritis   . Cancer [facility]) 2006   colon  . Cataract   . CHF (congestive heart failure) ([place]) 05/2016   preserved EF, grade 2 diastolic dysfunction  . Diabetes mellitus without complication ([place])   . Diverticulitis   . Erythroderma desquamativum   . Hypertension     Family History  Problem Relation Age of Onset  . Stroke Mother   . Diabetes Father   . Cancer Sister   . Diabetes Sister   . Cancer Brother   . Cancer Brother   . Diabetes Sister   . Stroke Sister     Past Surgical History:  Procedure Laterality Date  . ABDOMINAL  HYSTERECTOMY    . APPENDECTOMY    . COLON SURGERY     Social History   Occupational History  . Not on file  Tobacco Use  . Smoking status: Never Smoker  . Smokeless tobacco: Never Used  Substance and Sexual Activity  . Alcohol use: No  . Drug use: No  . Sexual activity: Not on file

## 2020-06-14 DIAGNOSIS — C189 Malignant neoplasm of colon, unspecified: Secondary | ICD-10-CM | POA: Diagnosis not present

## 2020-06-14 DIAGNOSIS — Z87440 Personal history of urinary (tract) infections: Secondary | ICD-10-CM | POA: Diagnosis not present

## 2020-06-14 DIAGNOSIS — L97811 Non-pressure chronic ulcer of other part of right lower leg limited to breakdown of skin: Secondary | ICD-10-CM | POA: Diagnosis not present

## 2020-06-14 DIAGNOSIS — G8929 Other chronic pain: Secondary | ICD-10-CM | POA: Diagnosis not present

## 2020-06-14 DIAGNOSIS — Z9181 History of falling: Secondary | ICD-10-CM | POA: Diagnosis not present

## 2020-06-14 DIAGNOSIS — L400 Psoriasis vulgaris: Secondary | ICD-10-CM | POA: Diagnosis not present

## 2020-06-14 DIAGNOSIS — Z4800 Encounter for change or removal of nonsurgical wound dressing: Secondary | ICD-10-CM | POA: Diagnosis not present

## 2020-06-14 DIAGNOSIS — E1151 Type 2 diabetes mellitus with diabetic peripheral angiopathy without gangrene: Secondary | ICD-10-CM | POA: Diagnosis not present

## 2020-06-14 DIAGNOSIS — K5792 Diverticulitis of intestine, part unspecified, without perforation or abscess without bleeding: Secondary | ICD-10-CM | POA: Diagnosis not present

## 2020-06-14 DIAGNOSIS — I110 Hypertensive heart disease with heart failure: Secondary | ICD-10-CM | POA: Diagnosis not present

## 2020-06-14 DIAGNOSIS — Z794 Long term (current) use of insulin: Secondary | ICD-10-CM | POA: Diagnosis not present

## 2020-06-14 DIAGNOSIS — I5033 Acute on chronic diastolic (congestive) heart failure: Secondary | ICD-10-CM | POA: Diagnosis not present

## 2020-06-14 DIAGNOSIS — I872 Venous insufficiency (chronic) (peripheral): Secondary | ICD-10-CM | POA: Diagnosis not present

## 2020-06-19 DIAGNOSIS — Z9181 History of falling: Secondary | ICD-10-CM | POA: Diagnosis not present

## 2020-06-19 DIAGNOSIS — K5900 Constipation, unspecified: Secondary | ICD-10-CM | POA: Diagnosis not present

## 2020-06-19 DIAGNOSIS — I872 Venous insufficiency (chronic) (peripheral): Secondary | ICD-10-CM | POA: Diagnosis not present

## 2020-06-19 DIAGNOSIS — L400 Psoriasis vulgaris: Secondary | ICD-10-CM | POA: Diagnosis not present

## 2020-06-19 DIAGNOSIS — G8929 Other chronic pain: Secondary | ICD-10-CM | POA: Diagnosis not present

## 2020-06-19 DIAGNOSIS — I110 Hypertensive heart disease with heart failure: Secondary | ICD-10-CM | POA: Diagnosis not present

## 2020-06-19 DIAGNOSIS — Z794 Long term (current) use of insulin: Secondary | ICD-10-CM | POA: Diagnosis not present

## 2020-06-19 DIAGNOSIS — E119 Type 2 diabetes mellitus without complications: Secondary | ICD-10-CM | POA: Diagnosis not present

## 2020-06-19 DIAGNOSIS — E1151 Type 2 diabetes mellitus with diabetic peripheral angiopathy without gangrene: Secondary | ICD-10-CM | POA: Diagnosis not present

## 2020-06-19 DIAGNOSIS — L97811 Non-pressure chronic ulcer of other part of right lower leg limited to breakdown of skin: Secondary | ICD-10-CM | POA: Diagnosis not present

## 2020-06-19 DIAGNOSIS — I5032 Chronic diastolic (congestive) heart failure: Secondary | ICD-10-CM | POA: Diagnosis not present

## 2020-06-19 DIAGNOSIS — I10 Essential (primary) hypertension: Secondary | ICD-10-CM | POA: Diagnosis not present

## 2020-06-19 DIAGNOSIS — K5792 Diverticulitis of intestine, part unspecified, without perforation or abscess without bleeding: Secondary | ICD-10-CM | POA: Diagnosis not present

## 2020-06-19 DIAGNOSIS — Z87440 Personal history of urinary (tract) infections: Secondary | ICD-10-CM | POA: Diagnosis not present

## 2020-06-19 DIAGNOSIS — Z4800 Encounter for change or removal of nonsurgical wound dressing: Secondary | ICD-10-CM | POA: Diagnosis not present

## 2020-06-19 DIAGNOSIS — I5033 Acute on chronic diastolic (congestive) heart failure: Secondary | ICD-10-CM | POA: Diagnosis not present

## 2020-06-19 DIAGNOSIS — R600 Localized edema: Secondary | ICD-10-CM | POA: Diagnosis not present

## 2020-06-19 DIAGNOSIS — C189 Malignant neoplasm of colon, unspecified: Secondary | ICD-10-CM | POA: Diagnosis not present

## 2020-06-22 DIAGNOSIS — I110 Hypertensive heart disease with heart failure: Secondary | ICD-10-CM | POA: Diagnosis not present

## 2020-06-22 DIAGNOSIS — Z794 Long term (current) use of insulin: Secondary | ICD-10-CM | POA: Diagnosis not present

## 2020-06-22 DIAGNOSIS — E1151 Type 2 diabetes mellitus with diabetic peripheral angiopathy without gangrene: Secondary | ICD-10-CM | POA: Diagnosis not present

## 2020-06-22 DIAGNOSIS — Z9181 History of falling: Secondary | ICD-10-CM | POA: Diagnosis not present

## 2020-06-22 DIAGNOSIS — I872 Venous insufficiency (chronic) (peripheral): Secondary | ICD-10-CM | POA: Diagnosis not present

## 2020-06-22 DIAGNOSIS — K5792 Diverticulitis of intestine, part unspecified, without perforation or abscess without bleeding: Secondary | ICD-10-CM | POA: Diagnosis not present

## 2020-06-22 DIAGNOSIS — L400 Psoriasis vulgaris: Secondary | ICD-10-CM | POA: Diagnosis not present

## 2020-06-22 DIAGNOSIS — Z4800 Encounter for change or removal of nonsurgical wound dressing: Secondary | ICD-10-CM | POA: Diagnosis not present

## 2020-06-22 DIAGNOSIS — G8929 Other chronic pain: Secondary | ICD-10-CM | POA: Diagnosis not present

## 2020-06-22 DIAGNOSIS — Z87440 Personal history of urinary (tract) infections: Secondary | ICD-10-CM | POA: Diagnosis not present

## 2020-06-22 DIAGNOSIS — L97811 Non-pressure chronic ulcer of other part of right lower leg limited to breakdown of skin: Secondary | ICD-10-CM | POA: Diagnosis not present

## 2020-06-22 DIAGNOSIS — I5033 Acute on chronic diastolic (congestive) heart failure: Secondary | ICD-10-CM | POA: Diagnosis not present

## 2020-06-22 DIAGNOSIS — C189 Malignant neoplasm of colon, unspecified: Secondary | ICD-10-CM | POA: Diagnosis not present

## 2020-06-26 DIAGNOSIS — L97811 Non-pressure chronic ulcer of other part of right lower leg limited to breakdown of skin: Secondary | ICD-10-CM | POA: Diagnosis not present

## 2020-06-26 DIAGNOSIS — Z87440 Personal history of urinary (tract) infections: Secondary | ICD-10-CM | POA: Diagnosis not present

## 2020-06-26 DIAGNOSIS — Z4800 Encounter for change or removal of nonsurgical wound dressing: Secondary | ICD-10-CM | POA: Diagnosis not present

## 2020-06-26 DIAGNOSIS — C189 Malignant neoplasm of colon, unspecified: Secondary | ICD-10-CM | POA: Diagnosis not present

## 2020-06-26 DIAGNOSIS — Z9181 History of falling: Secondary | ICD-10-CM | POA: Diagnosis not present

## 2020-06-26 DIAGNOSIS — E1151 Type 2 diabetes mellitus with diabetic peripheral angiopathy without gangrene: Secondary | ICD-10-CM | POA: Diagnosis not present

## 2020-06-26 DIAGNOSIS — L400 Psoriasis vulgaris: Secondary | ICD-10-CM | POA: Diagnosis not present

## 2020-06-26 DIAGNOSIS — I872 Venous insufficiency (chronic) (peripheral): Secondary | ICD-10-CM | POA: Diagnosis not present

## 2020-06-26 DIAGNOSIS — I110 Hypertensive heart disease with heart failure: Secondary | ICD-10-CM | POA: Diagnosis not present

## 2020-06-26 DIAGNOSIS — I5033 Acute on chronic diastolic (congestive) heart failure: Secondary | ICD-10-CM | POA: Diagnosis not present

## 2020-06-26 DIAGNOSIS — G8929 Other chronic pain: Secondary | ICD-10-CM | POA: Diagnosis not present

## 2020-06-26 DIAGNOSIS — K5792 Diverticulitis of intestine, part unspecified, without perforation or abscess without bleeding: Secondary | ICD-10-CM | POA: Diagnosis not present

## 2020-06-26 DIAGNOSIS — Z794 Long term (current) use of insulin: Secondary | ICD-10-CM | POA: Diagnosis not present

## 2020-06-27 DIAGNOSIS — D518 Other vitamin B12 deficiency anemias: Secondary | ICD-10-CM | POA: Diagnosis not present

## 2020-06-27 DIAGNOSIS — Z4800 Encounter for change or removal of nonsurgical wound dressing: Secondary | ICD-10-CM | POA: Diagnosis not present

## 2020-06-27 DIAGNOSIS — I872 Venous insufficiency (chronic) (peripheral): Secondary | ICD-10-CM | POA: Diagnosis not present

## 2020-06-27 DIAGNOSIS — E559 Vitamin D deficiency, unspecified: Secondary | ICD-10-CM | POA: Diagnosis not present

## 2020-06-27 DIAGNOSIS — E1151 Type 2 diabetes mellitus with diabetic peripheral angiopathy without gangrene: Secondary | ICD-10-CM | POA: Diagnosis not present

## 2020-06-27 DIAGNOSIS — I110 Hypertensive heart disease with heart failure: Secondary | ICD-10-CM | POA: Diagnosis not present

## 2020-06-27 DIAGNOSIS — Z79899 Other long term (current) drug therapy: Secondary | ICD-10-CM | POA: Diagnosis not present

## 2020-06-27 DIAGNOSIS — L97811 Non-pressure chronic ulcer of other part of right lower leg limited to breakdown of skin: Secondary | ICD-10-CM | POA: Diagnosis not present

## 2020-06-27 DIAGNOSIS — E119 Type 2 diabetes mellitus without complications: Secondary | ICD-10-CM | POA: Diagnosis not present

## 2020-06-27 DIAGNOSIS — E7849 Other hyperlipidemia: Secondary | ICD-10-CM | POA: Diagnosis not present

## 2020-06-28 DIAGNOSIS — I5033 Acute on chronic diastolic (congestive) heart failure: Secondary | ICD-10-CM | POA: Diagnosis not present

## 2020-06-28 DIAGNOSIS — K5792 Diverticulitis of intestine, part unspecified, without perforation or abscess without bleeding: Secondary | ICD-10-CM | POA: Diagnosis not present

## 2020-06-28 DIAGNOSIS — I872 Venous insufficiency (chronic) (peripheral): Secondary | ICD-10-CM | POA: Diagnosis not present

## 2020-06-28 DIAGNOSIS — L97811 Non-pressure chronic ulcer of other part of right lower leg limited to breakdown of skin: Secondary | ICD-10-CM | POA: Diagnosis not present

## 2020-06-28 DIAGNOSIS — G8929 Other chronic pain: Secondary | ICD-10-CM | POA: Diagnosis not present

## 2020-06-28 DIAGNOSIS — I110 Hypertensive heart disease with heart failure: Secondary | ICD-10-CM | POA: Diagnosis not present

## 2020-06-28 DIAGNOSIS — Z794 Long term (current) use of insulin: Secondary | ICD-10-CM | POA: Diagnosis not present

## 2020-06-28 DIAGNOSIS — Z87440 Personal history of urinary (tract) infections: Secondary | ICD-10-CM | POA: Diagnosis not present

## 2020-06-28 DIAGNOSIS — Z9181 History of falling: Secondary | ICD-10-CM | POA: Diagnosis not present

## 2020-06-28 DIAGNOSIS — L400 Psoriasis vulgaris: Secondary | ICD-10-CM | POA: Diagnosis not present

## 2020-06-28 DIAGNOSIS — E1151 Type 2 diabetes mellitus with diabetic peripheral angiopathy without gangrene: Secondary | ICD-10-CM | POA: Diagnosis not present

## 2020-06-28 DIAGNOSIS — Z4800 Encounter for change or removal of nonsurgical wound dressing: Secondary | ICD-10-CM | POA: Diagnosis not present

## 2020-06-28 DIAGNOSIS — C189 Malignant neoplasm of colon, unspecified: Secondary | ICD-10-CM | POA: Diagnosis not present

## 2020-07-03 DIAGNOSIS — L97811 Non-pressure chronic ulcer of other part of right lower leg limited to breakdown of skin: Secondary | ICD-10-CM | POA: Diagnosis not present

## 2020-07-03 DIAGNOSIS — I110 Hypertensive heart disease with heart failure: Secondary | ICD-10-CM | POA: Diagnosis not present

## 2020-07-03 DIAGNOSIS — I872 Venous insufficiency (chronic) (peripheral): Secondary | ICD-10-CM | POA: Diagnosis not present

## 2020-07-03 DIAGNOSIS — Z87440 Personal history of urinary (tract) infections: Secondary | ICD-10-CM | POA: Diagnosis not present

## 2020-07-03 DIAGNOSIS — Z4800 Encounter for change or removal of nonsurgical wound dressing: Secondary | ICD-10-CM | POA: Diagnosis not present

## 2020-07-03 DIAGNOSIS — C189 Malignant neoplasm of colon, unspecified: Secondary | ICD-10-CM | POA: Diagnosis not present

## 2020-07-03 DIAGNOSIS — E1151 Type 2 diabetes mellitus with diabetic peripheral angiopathy without gangrene: Secondary | ICD-10-CM | POA: Diagnosis not present

## 2020-07-03 DIAGNOSIS — Z9181 History of falling: Secondary | ICD-10-CM | POA: Diagnosis not present

## 2020-07-03 DIAGNOSIS — I5033 Acute on chronic diastolic (congestive) heart failure: Secondary | ICD-10-CM | POA: Diagnosis not present

## 2020-07-03 DIAGNOSIS — G8929 Other chronic pain: Secondary | ICD-10-CM | POA: Diagnosis not present

## 2020-07-03 DIAGNOSIS — K5792 Diverticulitis of intestine, part unspecified, without perforation or abscess without bleeding: Secondary | ICD-10-CM | POA: Diagnosis not present

## 2020-07-03 DIAGNOSIS — L400 Psoriasis vulgaris: Secondary | ICD-10-CM | POA: Diagnosis not present

## 2020-07-03 DIAGNOSIS — Z794 Long term (current) use of insulin: Secondary | ICD-10-CM | POA: Diagnosis not present

## 2020-07-05 DIAGNOSIS — Z794 Long term (current) use of insulin: Secondary | ICD-10-CM | POA: Diagnosis not present

## 2020-07-05 DIAGNOSIS — I5033 Acute on chronic diastolic (congestive) heart failure: Secondary | ICD-10-CM | POA: Diagnosis not present

## 2020-07-05 DIAGNOSIS — Z9181 History of falling: Secondary | ICD-10-CM | POA: Diagnosis not present

## 2020-07-05 DIAGNOSIS — Z87440 Personal history of urinary (tract) infections: Secondary | ICD-10-CM | POA: Diagnosis not present

## 2020-07-05 DIAGNOSIS — I872 Venous insufficiency (chronic) (peripheral): Secondary | ICD-10-CM | POA: Diagnosis not present

## 2020-07-05 DIAGNOSIS — E1151 Type 2 diabetes mellitus with diabetic peripheral angiopathy without gangrene: Secondary | ICD-10-CM | POA: Diagnosis not present

## 2020-07-05 DIAGNOSIS — K5792 Diverticulitis of intestine, part unspecified, without perforation or abscess without bleeding: Secondary | ICD-10-CM | POA: Diagnosis not present

## 2020-07-05 DIAGNOSIS — L97811 Non-pressure chronic ulcer of other part of right lower leg limited to breakdown of skin: Secondary | ICD-10-CM | POA: Diagnosis not present

## 2020-07-05 DIAGNOSIS — G8929 Other chronic pain: Secondary | ICD-10-CM | POA: Diagnosis not present

## 2020-07-05 DIAGNOSIS — I110 Hypertensive heart disease with heart failure: Secondary | ICD-10-CM | POA: Diagnosis not present

## 2020-07-05 DIAGNOSIS — C189 Malignant neoplasm of colon, unspecified: Secondary | ICD-10-CM | POA: Diagnosis not present

## 2020-07-05 DIAGNOSIS — L400 Psoriasis vulgaris: Secondary | ICD-10-CM | POA: Diagnosis not present

## 2020-07-05 DIAGNOSIS — Z4800 Encounter for change or removal of nonsurgical wound dressing: Secondary | ICD-10-CM | POA: Diagnosis not present

## 2020-07-10 DIAGNOSIS — L97811 Non-pressure chronic ulcer of other part of right lower leg limited to breakdown of skin: Secondary | ICD-10-CM | POA: Diagnosis not present

## 2020-07-10 DIAGNOSIS — Z794 Long term (current) use of insulin: Secondary | ICD-10-CM | POA: Diagnosis not present

## 2020-07-10 DIAGNOSIS — Z4800 Encounter for change or removal of nonsurgical wound dressing: Secondary | ICD-10-CM | POA: Diagnosis not present

## 2020-07-10 DIAGNOSIS — Z9181 History of falling: Secondary | ICD-10-CM | POA: Diagnosis not present

## 2020-07-10 DIAGNOSIS — I5033 Acute on chronic diastolic (congestive) heart failure: Secondary | ICD-10-CM | POA: Diagnosis not present

## 2020-07-10 DIAGNOSIS — G8929 Other chronic pain: Secondary | ICD-10-CM | POA: Diagnosis not present

## 2020-07-10 DIAGNOSIS — L400 Psoriasis vulgaris: Secondary | ICD-10-CM | POA: Diagnosis not present

## 2020-07-10 DIAGNOSIS — K5792 Diverticulitis of intestine, part unspecified, without perforation or abscess without bleeding: Secondary | ICD-10-CM | POA: Diagnosis not present

## 2020-07-10 DIAGNOSIS — I872 Venous insufficiency (chronic) (peripheral): Secondary | ICD-10-CM | POA: Diagnosis not present

## 2020-07-10 DIAGNOSIS — E1151 Type 2 diabetes mellitus with diabetic peripheral angiopathy without gangrene: Secondary | ICD-10-CM | POA: Diagnosis not present

## 2020-07-10 DIAGNOSIS — I110 Hypertensive heart disease with heart failure: Secondary | ICD-10-CM | POA: Diagnosis not present

## 2020-07-10 DIAGNOSIS — C189 Malignant neoplasm of colon, unspecified: Secondary | ICD-10-CM | POA: Diagnosis not present

## 2020-07-10 DIAGNOSIS — Z87440 Personal history of urinary (tract) infections: Secondary | ICD-10-CM | POA: Diagnosis not present

## 2020-07-12 DIAGNOSIS — Z87440 Personal history of urinary (tract) infections: Secondary | ICD-10-CM | POA: Diagnosis not present

## 2020-07-12 DIAGNOSIS — G8929 Other chronic pain: Secondary | ICD-10-CM | POA: Diagnosis not present

## 2020-07-12 DIAGNOSIS — I5033 Acute on chronic diastolic (congestive) heart failure: Secondary | ICD-10-CM | POA: Diagnosis not present

## 2020-07-12 DIAGNOSIS — Z794 Long term (current) use of insulin: Secondary | ICD-10-CM | POA: Diagnosis not present

## 2020-07-12 DIAGNOSIS — L400 Psoriasis vulgaris: Secondary | ICD-10-CM | POA: Diagnosis not present

## 2020-07-12 DIAGNOSIS — Z9181 History of falling: Secondary | ICD-10-CM | POA: Diagnosis not present

## 2020-07-12 DIAGNOSIS — C189 Malignant neoplasm of colon, unspecified: Secondary | ICD-10-CM | POA: Diagnosis not present

## 2020-07-12 DIAGNOSIS — E1151 Type 2 diabetes mellitus with diabetic peripheral angiopathy without gangrene: Secondary | ICD-10-CM | POA: Diagnosis not present

## 2020-07-12 DIAGNOSIS — I872 Venous insufficiency (chronic) (peripheral): Secondary | ICD-10-CM | POA: Diagnosis not present

## 2020-07-12 DIAGNOSIS — L97811 Non-pressure chronic ulcer of other part of right lower leg limited to breakdown of skin: Secondary | ICD-10-CM | POA: Diagnosis not present

## 2020-07-12 DIAGNOSIS — K5792 Diverticulitis of intestine, part unspecified, without perforation or abscess without bleeding: Secondary | ICD-10-CM | POA: Diagnosis not present

## 2020-07-12 DIAGNOSIS — Z4800 Encounter for change or removal of nonsurgical wound dressing: Secondary | ICD-10-CM | POA: Diagnosis not present

## 2020-07-12 DIAGNOSIS — I110 Hypertensive heart disease with heart failure: Secondary | ICD-10-CM | POA: Diagnosis not present

## 2020-07-16 DIAGNOSIS — I872 Venous insufficiency (chronic) (peripheral): Secondary | ICD-10-CM | POA: Diagnosis not present

## 2020-07-17 DIAGNOSIS — I110 Hypertensive heart disease with heart failure: Secondary | ICD-10-CM | POA: Diagnosis not present

## 2020-07-17 DIAGNOSIS — Z4800 Encounter for change or removal of nonsurgical wound dressing: Secondary | ICD-10-CM | POA: Diagnosis not present

## 2020-07-17 DIAGNOSIS — E1151 Type 2 diabetes mellitus with diabetic peripheral angiopathy without gangrene: Secondary | ICD-10-CM | POA: Diagnosis not present

## 2020-07-17 DIAGNOSIS — Z794 Long term (current) use of insulin: Secondary | ICD-10-CM | POA: Diagnosis not present

## 2020-07-17 DIAGNOSIS — C189 Malignant neoplasm of colon, unspecified: Secondary | ICD-10-CM | POA: Diagnosis not present

## 2020-07-17 DIAGNOSIS — L97811 Non-pressure chronic ulcer of other part of right lower leg limited to breakdown of skin: Secondary | ICD-10-CM | POA: Diagnosis not present

## 2020-07-17 DIAGNOSIS — I5033 Acute on chronic diastolic (congestive) heart failure: Secondary | ICD-10-CM | POA: Diagnosis not present

## 2020-07-17 DIAGNOSIS — Z9181 History of falling: Secondary | ICD-10-CM | POA: Diagnosis not present

## 2020-07-17 DIAGNOSIS — L400 Psoriasis vulgaris: Secondary | ICD-10-CM | POA: Diagnosis not present

## 2020-07-17 DIAGNOSIS — K5792 Diverticulitis of intestine, part unspecified, without perforation or abscess without bleeding: Secondary | ICD-10-CM | POA: Diagnosis not present

## 2020-07-17 DIAGNOSIS — G8929 Other chronic pain: Secondary | ICD-10-CM | POA: Diagnosis not present

## 2020-07-17 DIAGNOSIS — Z87440 Personal history of urinary (tract) infections: Secondary | ICD-10-CM | POA: Diagnosis not present

## 2020-07-17 DIAGNOSIS — I872 Venous insufficiency (chronic) (peripheral): Secondary | ICD-10-CM | POA: Diagnosis not present

## 2020-07-20 DIAGNOSIS — K5792 Diverticulitis of intestine, part unspecified, without perforation or abscess without bleeding: Secondary | ICD-10-CM | POA: Diagnosis not present

## 2020-07-20 DIAGNOSIS — Z794 Long term (current) use of insulin: Secondary | ICD-10-CM | POA: Diagnosis not present

## 2020-07-20 DIAGNOSIS — E1151 Type 2 diabetes mellitus with diabetic peripheral angiopathy without gangrene: Secondary | ICD-10-CM | POA: Diagnosis not present

## 2020-07-20 DIAGNOSIS — C189 Malignant neoplasm of colon, unspecified: Secondary | ICD-10-CM | POA: Diagnosis not present

## 2020-07-20 DIAGNOSIS — Z87440 Personal history of urinary (tract) infections: Secondary | ICD-10-CM | POA: Diagnosis not present

## 2020-07-20 DIAGNOSIS — Z4800 Encounter for change or removal of nonsurgical wound dressing: Secondary | ICD-10-CM | POA: Diagnosis not present

## 2020-07-20 DIAGNOSIS — I110 Hypertensive heart disease with heart failure: Secondary | ICD-10-CM | POA: Diagnosis not present

## 2020-07-20 DIAGNOSIS — I5033 Acute on chronic diastolic (congestive) heart failure: Secondary | ICD-10-CM | POA: Diagnosis not present

## 2020-07-20 DIAGNOSIS — L97811 Non-pressure chronic ulcer of other part of right lower leg limited to breakdown of skin: Secondary | ICD-10-CM | POA: Diagnosis not present

## 2020-07-20 DIAGNOSIS — L400 Psoriasis vulgaris: Secondary | ICD-10-CM | POA: Diagnosis not present

## 2020-07-20 DIAGNOSIS — G8929 Other chronic pain: Secondary | ICD-10-CM | POA: Diagnosis not present

## 2020-07-20 DIAGNOSIS — Z9181 History of falling: Secondary | ICD-10-CM | POA: Diagnosis not present

## 2020-07-20 DIAGNOSIS — I872 Venous insufficiency (chronic) (peripheral): Secondary | ICD-10-CM | POA: Diagnosis not present

## 2020-07-24 DIAGNOSIS — K5792 Diverticulitis of intestine, part unspecified, without perforation or abscess without bleeding: Secondary | ICD-10-CM | POA: Diagnosis not present

## 2020-07-24 DIAGNOSIS — C189 Malignant neoplasm of colon, unspecified: Secondary | ICD-10-CM | POA: Diagnosis not present

## 2020-07-24 DIAGNOSIS — K5900 Constipation, unspecified: Secondary | ICD-10-CM | POA: Diagnosis not present

## 2020-07-24 DIAGNOSIS — Z87440 Personal history of urinary (tract) infections: Secondary | ICD-10-CM | POA: Diagnosis not present

## 2020-07-24 DIAGNOSIS — I872 Venous insufficiency (chronic) (peripheral): Secondary | ICD-10-CM | POA: Diagnosis not present

## 2020-07-24 DIAGNOSIS — I5032 Chronic diastolic (congestive) heart failure: Secondary | ICD-10-CM | POA: Diagnosis not present

## 2020-07-24 DIAGNOSIS — E119 Type 2 diabetes mellitus without complications: Secondary | ICD-10-CM | POA: Diagnosis not present

## 2020-07-24 DIAGNOSIS — I10 Essential (primary) hypertension: Secondary | ICD-10-CM | POA: Diagnosis not present

## 2020-07-24 DIAGNOSIS — R600 Localized edema: Secondary | ICD-10-CM | POA: Diagnosis not present

## 2020-07-24 DIAGNOSIS — E785 Hyperlipidemia, unspecified: Secondary | ICD-10-CM | POA: Diagnosis not present

## 2020-07-24 DIAGNOSIS — I110 Hypertensive heart disease with heart failure: Secondary | ICD-10-CM | POA: Diagnosis not present

## 2020-07-24 DIAGNOSIS — Z9181 History of falling: Secondary | ICD-10-CM | POA: Diagnosis not present

## 2020-07-24 DIAGNOSIS — G8929 Other chronic pain: Secondary | ICD-10-CM | POA: Diagnosis not present

## 2020-07-24 DIAGNOSIS — L400 Psoriasis vulgaris: Secondary | ICD-10-CM | POA: Diagnosis not present

## 2020-07-24 DIAGNOSIS — I5033 Acute on chronic diastolic (congestive) heart failure: Secondary | ICD-10-CM | POA: Diagnosis not present

## 2020-07-24 DIAGNOSIS — E1151 Type 2 diabetes mellitus with diabetic peripheral angiopathy without gangrene: Secondary | ICD-10-CM | POA: Diagnosis not present

## 2020-07-24 DIAGNOSIS — L97811 Non-pressure chronic ulcer of other part of right lower leg limited to breakdown of skin: Secondary | ICD-10-CM | POA: Diagnosis not present

## 2020-07-24 DIAGNOSIS — M1990 Unspecified osteoarthritis, unspecified site: Secondary | ICD-10-CM | POA: Diagnosis not present

## 2020-07-24 DIAGNOSIS — Z4800 Encounter for change or removal of nonsurgical wound dressing: Secondary | ICD-10-CM | POA: Diagnosis not present

## 2020-07-24 DIAGNOSIS — E559 Vitamin D deficiency, unspecified: Secondary | ICD-10-CM | POA: Diagnosis not present

## 2020-07-24 DIAGNOSIS — Z794 Long term (current) use of insulin: Secondary | ICD-10-CM | POA: Diagnosis not present

## 2020-07-26 DIAGNOSIS — L400 Psoriasis vulgaris: Secondary | ICD-10-CM | POA: Diagnosis not present

## 2020-07-26 DIAGNOSIS — Z4800 Encounter for change or removal of nonsurgical wound dressing: Secondary | ICD-10-CM | POA: Diagnosis not present

## 2020-07-26 DIAGNOSIS — I872 Venous insufficiency (chronic) (peripheral): Secondary | ICD-10-CM | POA: Diagnosis not present

## 2020-07-26 DIAGNOSIS — Z794 Long term (current) use of insulin: Secondary | ICD-10-CM | POA: Diagnosis not present

## 2020-07-26 DIAGNOSIS — L97811 Non-pressure chronic ulcer of other part of right lower leg limited to breakdown of skin: Secondary | ICD-10-CM | POA: Diagnosis not present

## 2020-07-26 DIAGNOSIS — I110 Hypertensive heart disease with heart failure: Secondary | ICD-10-CM | POA: Diagnosis not present

## 2020-07-26 DIAGNOSIS — K5792 Diverticulitis of intestine, part unspecified, without perforation or abscess without bleeding: Secondary | ICD-10-CM | POA: Diagnosis not present

## 2020-07-26 DIAGNOSIS — Z9181 History of falling: Secondary | ICD-10-CM | POA: Diagnosis not present

## 2020-07-26 DIAGNOSIS — C189 Malignant neoplasm of colon, unspecified: Secondary | ICD-10-CM | POA: Diagnosis not present

## 2020-07-26 DIAGNOSIS — G8929 Other chronic pain: Secondary | ICD-10-CM | POA: Diagnosis not present

## 2020-07-26 DIAGNOSIS — I5033 Acute on chronic diastolic (congestive) heart failure: Secondary | ICD-10-CM | POA: Diagnosis not present

## 2020-07-26 DIAGNOSIS — E1151 Type 2 diabetes mellitus with diabetic peripheral angiopathy without gangrene: Secondary | ICD-10-CM | POA: Diagnosis not present

## 2020-07-26 DIAGNOSIS — Z87440 Personal history of urinary (tract) infections: Secondary | ICD-10-CM | POA: Diagnosis not present

## 2020-07-31 DIAGNOSIS — G8929 Other chronic pain: Secondary | ICD-10-CM | POA: Diagnosis not present

## 2020-07-31 DIAGNOSIS — L400 Psoriasis vulgaris: Secondary | ICD-10-CM | POA: Diagnosis not present

## 2020-07-31 DIAGNOSIS — Z794 Long term (current) use of insulin: Secondary | ICD-10-CM | POA: Diagnosis not present

## 2020-07-31 DIAGNOSIS — C189 Malignant neoplasm of colon, unspecified: Secondary | ICD-10-CM | POA: Diagnosis not present

## 2020-07-31 DIAGNOSIS — E1151 Type 2 diabetes mellitus with diabetic peripheral angiopathy without gangrene: Secondary | ICD-10-CM | POA: Diagnosis not present

## 2020-07-31 DIAGNOSIS — I110 Hypertensive heart disease with heart failure: Secondary | ICD-10-CM | POA: Diagnosis not present

## 2020-07-31 DIAGNOSIS — I5033 Acute on chronic diastolic (congestive) heart failure: Secondary | ICD-10-CM | POA: Diagnosis not present

## 2020-07-31 DIAGNOSIS — L97811 Non-pressure chronic ulcer of other part of right lower leg limited to breakdown of skin: Secondary | ICD-10-CM | POA: Diagnosis not present

## 2020-07-31 DIAGNOSIS — Z87440 Personal history of urinary (tract) infections: Secondary | ICD-10-CM | POA: Diagnosis not present

## 2020-07-31 DIAGNOSIS — Z4800 Encounter for change or removal of nonsurgical wound dressing: Secondary | ICD-10-CM | POA: Diagnosis not present

## 2020-07-31 DIAGNOSIS — Z9181 History of falling: Secondary | ICD-10-CM | POA: Diagnosis not present

## 2020-07-31 DIAGNOSIS — I872 Venous insufficiency (chronic) (peripheral): Secondary | ICD-10-CM | POA: Diagnosis not present

## 2020-07-31 DIAGNOSIS — K5792 Diverticulitis of intestine, part unspecified, without perforation or abscess without bleeding: Secondary | ICD-10-CM | POA: Diagnosis not present

## 2020-08-02 DIAGNOSIS — I5033 Acute on chronic diastolic (congestive) heart failure: Secondary | ICD-10-CM | POA: Diagnosis not present

## 2020-08-02 DIAGNOSIS — I110 Hypertensive heart disease with heart failure: Secondary | ICD-10-CM | POA: Diagnosis not present

## 2020-08-02 DIAGNOSIS — G8929 Other chronic pain: Secondary | ICD-10-CM | POA: Diagnosis not present

## 2020-08-02 DIAGNOSIS — L97811 Non-pressure chronic ulcer of other part of right lower leg limited to breakdown of skin: Secondary | ICD-10-CM | POA: Diagnosis not present

## 2020-08-02 DIAGNOSIS — K5792 Diverticulitis of intestine, part unspecified, without perforation or abscess without bleeding: Secondary | ICD-10-CM | POA: Diagnosis not present

## 2020-08-02 DIAGNOSIS — Z4800 Encounter for change or removal of nonsurgical wound dressing: Secondary | ICD-10-CM | POA: Diagnosis not present

## 2020-08-02 DIAGNOSIS — L400 Psoriasis vulgaris: Secondary | ICD-10-CM | POA: Diagnosis not present

## 2020-08-02 DIAGNOSIS — Z9181 History of falling: Secondary | ICD-10-CM | POA: Diagnosis not present

## 2020-08-02 DIAGNOSIS — C189 Malignant neoplasm of colon, unspecified: Secondary | ICD-10-CM | POA: Diagnosis not present

## 2020-08-02 DIAGNOSIS — Z794 Long term (current) use of insulin: Secondary | ICD-10-CM | POA: Diagnosis not present

## 2020-08-02 DIAGNOSIS — Z87440 Personal history of urinary (tract) infections: Secondary | ICD-10-CM | POA: Diagnosis not present

## 2020-08-02 DIAGNOSIS — E1151 Type 2 diabetes mellitus with diabetic peripheral angiopathy without gangrene: Secondary | ICD-10-CM | POA: Diagnosis not present

## 2020-08-02 DIAGNOSIS — I872 Venous insufficiency (chronic) (peripheral): Secondary | ICD-10-CM | POA: Diagnosis not present

## 2020-08-07 DIAGNOSIS — C189 Malignant neoplasm of colon, unspecified: Secondary | ICD-10-CM | POA: Diagnosis not present

## 2020-08-07 DIAGNOSIS — K5792 Diverticulitis of intestine, part unspecified, without perforation or abscess without bleeding: Secondary | ICD-10-CM | POA: Diagnosis not present

## 2020-08-07 DIAGNOSIS — Z794 Long term (current) use of insulin: Secondary | ICD-10-CM | POA: Diagnosis not present

## 2020-08-07 DIAGNOSIS — I872 Venous insufficiency (chronic) (peripheral): Secondary | ICD-10-CM | POA: Diagnosis not present

## 2020-08-07 DIAGNOSIS — L97811 Non-pressure chronic ulcer of other part of right lower leg limited to breakdown of skin: Secondary | ICD-10-CM | POA: Diagnosis not present

## 2020-08-07 DIAGNOSIS — G8929 Other chronic pain: Secondary | ICD-10-CM | POA: Diagnosis not present

## 2020-08-07 DIAGNOSIS — I110 Hypertensive heart disease with heart failure: Secondary | ICD-10-CM | POA: Diagnosis not present

## 2020-08-07 DIAGNOSIS — Z9181 History of falling: Secondary | ICD-10-CM | POA: Diagnosis not present

## 2020-08-07 DIAGNOSIS — Z87440 Personal history of urinary (tract) infections: Secondary | ICD-10-CM | POA: Diagnosis not present

## 2020-08-07 DIAGNOSIS — L400 Psoriasis vulgaris: Secondary | ICD-10-CM | POA: Diagnosis not present

## 2020-08-07 DIAGNOSIS — Z4800 Encounter for change or removal of nonsurgical wound dressing: Secondary | ICD-10-CM | POA: Diagnosis not present

## 2020-08-07 DIAGNOSIS — I5033 Acute on chronic diastolic (congestive) heart failure: Secondary | ICD-10-CM | POA: Diagnosis not present

## 2020-08-07 DIAGNOSIS — E1151 Type 2 diabetes mellitus with diabetic peripheral angiopathy without gangrene: Secondary | ICD-10-CM | POA: Diagnosis not present

## 2020-08-10 DIAGNOSIS — G8929 Other chronic pain: Secondary | ICD-10-CM | POA: Diagnosis not present

## 2020-08-10 DIAGNOSIS — Z794 Long term (current) use of insulin: Secondary | ICD-10-CM | POA: Diagnosis not present

## 2020-08-10 DIAGNOSIS — I110 Hypertensive heart disease with heart failure: Secondary | ICD-10-CM | POA: Diagnosis not present

## 2020-08-10 DIAGNOSIS — Z9181 History of falling: Secondary | ICD-10-CM | POA: Diagnosis not present

## 2020-08-10 DIAGNOSIS — L97811 Non-pressure chronic ulcer of other part of right lower leg limited to breakdown of skin: Secondary | ICD-10-CM | POA: Diagnosis not present

## 2020-08-10 DIAGNOSIS — Z4800 Encounter for change or removal of nonsurgical wound dressing: Secondary | ICD-10-CM | POA: Diagnosis not present

## 2020-08-10 DIAGNOSIS — I872 Venous insufficiency (chronic) (peripheral): Secondary | ICD-10-CM | POA: Diagnosis not present

## 2020-08-10 DIAGNOSIS — Z87440 Personal history of urinary (tract) infections: Secondary | ICD-10-CM | POA: Diagnosis not present

## 2020-08-10 DIAGNOSIS — K5792 Diverticulitis of intestine, part unspecified, without perforation or abscess without bleeding: Secondary | ICD-10-CM | POA: Diagnosis not present

## 2020-08-10 DIAGNOSIS — C189 Malignant neoplasm of colon, unspecified: Secondary | ICD-10-CM | POA: Diagnosis not present

## 2020-08-10 DIAGNOSIS — I5033 Acute on chronic diastolic (congestive) heart failure: Secondary | ICD-10-CM | POA: Diagnosis not present

## 2020-08-10 DIAGNOSIS — L400 Psoriasis vulgaris: Secondary | ICD-10-CM | POA: Diagnosis not present

## 2020-08-10 DIAGNOSIS — E1151 Type 2 diabetes mellitus with diabetic peripheral angiopathy without gangrene: Secondary | ICD-10-CM | POA: Diagnosis not present

## 2020-08-14 DIAGNOSIS — L97811 Non-pressure chronic ulcer of other part of right lower leg limited to breakdown of skin: Secondary | ICD-10-CM | POA: Diagnosis not present

## 2020-08-14 DIAGNOSIS — C189 Malignant neoplasm of colon, unspecified: Secondary | ICD-10-CM | POA: Diagnosis not present

## 2020-08-14 DIAGNOSIS — K5792 Diverticulitis of intestine, part unspecified, without perforation or abscess without bleeding: Secondary | ICD-10-CM | POA: Diagnosis not present

## 2020-08-14 DIAGNOSIS — Z9181 History of falling: Secondary | ICD-10-CM | POA: Diagnosis not present

## 2020-08-14 DIAGNOSIS — L400 Psoriasis vulgaris: Secondary | ICD-10-CM | POA: Diagnosis not present

## 2020-08-14 DIAGNOSIS — Z87440 Personal history of urinary (tract) infections: Secondary | ICD-10-CM | POA: Diagnosis not present

## 2020-08-14 DIAGNOSIS — E1151 Type 2 diabetes mellitus with diabetic peripheral angiopathy without gangrene: Secondary | ICD-10-CM | POA: Diagnosis not present

## 2020-08-14 DIAGNOSIS — Z794 Long term (current) use of insulin: Secondary | ICD-10-CM | POA: Diagnosis not present

## 2020-08-14 DIAGNOSIS — I5033 Acute on chronic diastolic (congestive) heart failure: Secondary | ICD-10-CM | POA: Diagnosis not present

## 2020-08-14 DIAGNOSIS — Z4800 Encounter for change or removal of nonsurgical wound dressing: Secondary | ICD-10-CM | POA: Diagnosis not present

## 2020-08-14 DIAGNOSIS — I872 Venous insufficiency (chronic) (peripheral): Secondary | ICD-10-CM | POA: Diagnosis not present

## 2020-08-14 DIAGNOSIS — I110 Hypertensive heart disease with heart failure: Secondary | ICD-10-CM | POA: Diagnosis not present

## 2020-08-14 DIAGNOSIS — G8929 Other chronic pain: Secondary | ICD-10-CM | POA: Diagnosis not present

## 2020-08-17 DIAGNOSIS — I872 Venous insufficiency (chronic) (peripheral): Secondary | ICD-10-CM | POA: Diagnosis not present

## 2020-08-17 DIAGNOSIS — L97818 Non-pressure chronic ulcer of other part of right lower leg with other specified severity: Secondary | ICD-10-CM | POA: Diagnosis not present

## 2020-08-17 DIAGNOSIS — I110 Hypertensive heart disease with heart failure: Secondary | ICD-10-CM | POA: Diagnosis not present

## 2020-08-17 DIAGNOSIS — E1151 Type 2 diabetes mellitus with diabetic peripheral angiopathy without gangrene: Secondary | ICD-10-CM | POA: Diagnosis not present

## 2020-08-17 DIAGNOSIS — Z4800 Encounter for change or removal of nonsurgical wound dressing: Secondary | ICD-10-CM | POA: Diagnosis not present

## 2020-08-17 DIAGNOSIS — I5032 Chronic diastolic (congestive) heart failure: Secondary | ICD-10-CM | POA: Diagnosis not present

## 2020-08-21 DIAGNOSIS — R600 Localized edema: Secondary | ICD-10-CM | POA: Diagnosis not present

## 2020-08-21 DIAGNOSIS — I872 Venous insufficiency (chronic) (peripheral): Secondary | ICD-10-CM | POA: Diagnosis not present

## 2020-08-21 DIAGNOSIS — I5032 Chronic diastolic (congestive) heart failure: Secondary | ICD-10-CM | POA: Diagnosis not present

## 2020-08-21 DIAGNOSIS — E559 Vitamin D deficiency, unspecified: Secondary | ICD-10-CM | POA: Diagnosis not present

## 2020-08-21 DIAGNOSIS — E1151 Type 2 diabetes mellitus with diabetic peripheral angiopathy without gangrene: Secondary | ICD-10-CM | POA: Diagnosis not present

## 2020-08-21 DIAGNOSIS — L97818 Non-pressure chronic ulcer of other part of right lower leg with other specified severity: Secondary | ICD-10-CM | POA: Diagnosis not present

## 2020-08-21 DIAGNOSIS — Z4800 Encounter for change or removal of nonsurgical wound dressing: Secondary | ICD-10-CM | POA: Diagnosis not present

## 2020-08-21 DIAGNOSIS — I110 Hypertensive heart disease with heart failure: Secondary | ICD-10-CM | POA: Diagnosis not present

## 2020-08-21 DIAGNOSIS — E119 Type 2 diabetes mellitus without complications: Secondary | ICD-10-CM | POA: Diagnosis not present

## 2020-08-21 DIAGNOSIS — I10 Essential (primary) hypertension: Secondary | ICD-10-CM | POA: Diagnosis not present

## 2020-08-21 DIAGNOSIS — E785 Hyperlipidemia, unspecified: Secondary | ICD-10-CM | POA: Diagnosis not present

## 2020-08-21 DIAGNOSIS — K5900 Constipation, unspecified: Secondary | ICD-10-CM | POA: Diagnosis not present

## 2020-08-21 DIAGNOSIS — M1990 Unspecified osteoarthritis, unspecified site: Secondary | ICD-10-CM | POA: Diagnosis not present

## 2020-08-22 DIAGNOSIS — U071 COVID-19: Secondary | ICD-10-CM | POA: Diagnosis not present

## 2020-08-24 DIAGNOSIS — Z4800 Encounter for change or removal of nonsurgical wound dressing: Secondary | ICD-10-CM | POA: Diagnosis not present

## 2020-08-24 DIAGNOSIS — I110 Hypertensive heart disease with heart failure: Secondary | ICD-10-CM | POA: Diagnosis not present

## 2020-08-24 DIAGNOSIS — I5032 Chronic diastolic (congestive) heart failure: Secondary | ICD-10-CM | POA: Diagnosis not present

## 2020-08-24 DIAGNOSIS — L97818 Non-pressure chronic ulcer of other part of right lower leg with other specified severity: Secondary | ICD-10-CM | POA: Diagnosis not present

## 2020-08-24 DIAGNOSIS — I872 Venous insufficiency (chronic) (peripheral): Secondary | ICD-10-CM | POA: Diagnosis not present

## 2020-08-24 DIAGNOSIS — E1151 Type 2 diabetes mellitus with diabetic peripheral angiopathy without gangrene: Secondary | ICD-10-CM | POA: Diagnosis not present

## 2020-08-28 DIAGNOSIS — U071 COVID-19: Secondary | ICD-10-CM | POA: Diagnosis not present

## 2020-08-28 DIAGNOSIS — I110 Hypertensive heart disease with heart failure: Secondary | ICD-10-CM | POA: Diagnosis not present

## 2020-08-28 DIAGNOSIS — I5032 Chronic diastolic (congestive) heart failure: Secondary | ICD-10-CM | POA: Diagnosis not present

## 2020-08-28 DIAGNOSIS — I872 Venous insufficiency (chronic) (peripheral): Secondary | ICD-10-CM | POA: Diagnosis not present

## 2020-08-28 DIAGNOSIS — E1151 Type 2 diabetes mellitus with diabetic peripheral angiopathy without gangrene: Secondary | ICD-10-CM | POA: Diagnosis not present

## 2020-08-28 DIAGNOSIS — L97818 Non-pressure chronic ulcer of other part of right lower leg with other specified severity: Secondary | ICD-10-CM | POA: Diagnosis not present

## 2020-08-28 DIAGNOSIS — Z4800 Encounter for change or removal of nonsurgical wound dressing: Secondary | ICD-10-CM | POA: Diagnosis not present

## 2020-08-30 DIAGNOSIS — E559 Vitamin D deficiency, unspecified: Secondary | ICD-10-CM | POA: Diagnosis not present

## 2020-08-30 DIAGNOSIS — E038 Other specified hypothyroidism: Secondary | ICD-10-CM | POA: Diagnosis not present

## 2020-08-30 DIAGNOSIS — E119 Type 2 diabetes mellitus without complications: Secondary | ICD-10-CM | POA: Diagnosis not present

## 2020-08-30 DIAGNOSIS — D518 Other vitamin B12 deficiency anemias: Secondary | ICD-10-CM | POA: Diagnosis not present

## 2020-08-30 DIAGNOSIS — M1990 Unspecified osteoarthritis, unspecified site: Secondary | ICD-10-CM | POA: Diagnosis not present

## 2020-08-30 DIAGNOSIS — I5032 Chronic diastolic (congestive) heart failure: Secondary | ICD-10-CM | POA: Diagnosis not present

## 2020-08-30 DIAGNOSIS — I509 Heart failure, unspecified: Secondary | ICD-10-CM | POA: Diagnosis not present

## 2020-08-30 DIAGNOSIS — E785 Hyperlipidemia, unspecified: Secondary | ICD-10-CM | POA: Diagnosis not present

## 2020-08-30 DIAGNOSIS — I10 Essential (primary) hypertension: Secondary | ICD-10-CM | POA: Diagnosis not present

## 2020-08-31 DIAGNOSIS — Z4800 Encounter for change or removal of nonsurgical wound dressing: Secondary | ICD-10-CM | POA: Diagnosis not present

## 2020-08-31 DIAGNOSIS — I110 Hypertensive heart disease with heart failure: Secondary | ICD-10-CM | POA: Diagnosis not present

## 2020-08-31 DIAGNOSIS — I5032 Chronic diastolic (congestive) heart failure: Secondary | ICD-10-CM | POA: Diagnosis not present

## 2020-08-31 DIAGNOSIS — E1151 Type 2 diabetes mellitus with diabetic peripheral angiopathy without gangrene: Secondary | ICD-10-CM | POA: Diagnosis not present

## 2020-08-31 DIAGNOSIS — I872 Venous insufficiency (chronic) (peripheral): Secondary | ICD-10-CM | POA: Diagnosis not present

## 2020-08-31 DIAGNOSIS — L97818 Non-pressure chronic ulcer of other part of right lower leg with other specified severity: Secondary | ICD-10-CM | POA: Diagnosis not present

## 2020-09-04 DIAGNOSIS — I110 Hypertensive heart disease with heart failure: Secondary | ICD-10-CM | POA: Diagnosis not present

## 2020-09-04 DIAGNOSIS — E1151 Type 2 diabetes mellitus with diabetic peripheral angiopathy without gangrene: Secondary | ICD-10-CM | POA: Diagnosis not present

## 2020-09-04 DIAGNOSIS — I5032 Chronic diastolic (congestive) heart failure: Secondary | ICD-10-CM | POA: Diagnosis not present

## 2020-09-04 DIAGNOSIS — L97818 Non-pressure chronic ulcer of other part of right lower leg with other specified severity: Secondary | ICD-10-CM | POA: Diagnosis not present

## 2020-09-04 DIAGNOSIS — Z4800 Encounter for change or removal of nonsurgical wound dressing: Secondary | ICD-10-CM | POA: Diagnosis not present

## 2020-09-04 DIAGNOSIS — I872 Venous insufficiency (chronic) (peripheral): Secondary | ICD-10-CM | POA: Diagnosis not present

## 2020-09-06 DIAGNOSIS — E1151 Type 2 diabetes mellitus with diabetic peripheral angiopathy without gangrene: Secondary | ICD-10-CM | POA: Diagnosis not present

## 2020-09-06 DIAGNOSIS — B351 Tinea unguium: Secondary | ICD-10-CM | POA: Diagnosis not present

## 2020-09-06 DIAGNOSIS — M79676 Pain in unspecified toe(s): Secondary | ICD-10-CM | POA: Diagnosis not present

## 2020-09-07 DIAGNOSIS — I872 Venous insufficiency (chronic) (peripheral): Secondary | ICD-10-CM | POA: Diagnosis not present

## 2020-09-07 DIAGNOSIS — L97818 Non-pressure chronic ulcer of other part of right lower leg with other specified severity: Secondary | ICD-10-CM | POA: Diagnosis not present

## 2020-09-07 DIAGNOSIS — I5032 Chronic diastolic (congestive) heart failure: Secondary | ICD-10-CM | POA: Diagnosis not present

## 2020-09-07 DIAGNOSIS — Z4800 Encounter for change or removal of nonsurgical wound dressing: Secondary | ICD-10-CM | POA: Diagnosis not present

## 2020-09-07 DIAGNOSIS — I110 Hypertensive heart disease with heart failure: Secondary | ICD-10-CM | POA: Diagnosis not present

## 2020-09-07 DIAGNOSIS — E1151 Type 2 diabetes mellitus with diabetic peripheral angiopathy without gangrene: Secondary | ICD-10-CM | POA: Diagnosis not present

## 2020-09-11 DIAGNOSIS — I872 Venous insufficiency (chronic) (peripheral): Secondary | ICD-10-CM | POA: Diagnosis not present

## 2020-09-11 DIAGNOSIS — Z4800 Encounter for change or removal of nonsurgical wound dressing: Secondary | ICD-10-CM | POA: Diagnosis not present

## 2020-09-11 DIAGNOSIS — I5032 Chronic diastolic (congestive) heart failure: Secondary | ICD-10-CM | POA: Diagnosis not present

## 2020-09-11 DIAGNOSIS — L97818 Non-pressure chronic ulcer of other part of right lower leg with other specified severity: Secondary | ICD-10-CM | POA: Diagnosis not present

## 2020-09-11 DIAGNOSIS — E1151 Type 2 diabetes mellitus with diabetic peripheral angiopathy without gangrene: Secondary | ICD-10-CM | POA: Diagnosis not present

## 2020-09-11 DIAGNOSIS — I110 Hypertensive heart disease with heart failure: Secondary | ICD-10-CM | POA: Diagnosis not present

## 2020-09-14 DIAGNOSIS — I872 Venous insufficiency (chronic) (peripheral): Secondary | ICD-10-CM | POA: Diagnosis not present

## 2020-09-14 DIAGNOSIS — I110 Hypertensive heart disease with heart failure: Secondary | ICD-10-CM | POA: Diagnosis not present

## 2020-09-14 DIAGNOSIS — I5032 Chronic diastolic (congestive) heart failure: Secondary | ICD-10-CM | POA: Diagnosis not present

## 2020-09-14 DIAGNOSIS — L97818 Non-pressure chronic ulcer of other part of right lower leg with other specified severity: Secondary | ICD-10-CM | POA: Diagnosis not present

## 2020-09-14 DIAGNOSIS — Z4800 Encounter for change or removal of nonsurgical wound dressing: Secondary | ICD-10-CM | POA: Diagnosis not present

## 2020-09-14 DIAGNOSIS — E1151 Type 2 diabetes mellitus with diabetic peripheral angiopathy without gangrene: Secondary | ICD-10-CM | POA: Diagnosis not present

## 2020-09-18 DIAGNOSIS — I5032 Chronic diastolic (congestive) heart failure: Secondary | ICD-10-CM | POA: Diagnosis not present

## 2020-09-18 DIAGNOSIS — E1151 Type 2 diabetes mellitus with diabetic peripheral angiopathy without gangrene: Secondary | ICD-10-CM | POA: Diagnosis not present

## 2020-09-18 DIAGNOSIS — L97818 Non-pressure chronic ulcer of other part of right lower leg with other specified severity: Secondary | ICD-10-CM | POA: Diagnosis not present

## 2020-09-18 DIAGNOSIS — I872 Venous insufficiency (chronic) (peripheral): Secondary | ICD-10-CM | POA: Diagnosis not present

## 2020-09-18 DIAGNOSIS — I110 Hypertensive heart disease with heart failure: Secondary | ICD-10-CM | POA: Diagnosis not present

## 2020-09-18 DIAGNOSIS — Z4800 Encounter for change or removal of nonsurgical wound dressing: Secondary | ICD-10-CM | POA: Diagnosis not present

## 2020-09-21 DIAGNOSIS — I110 Hypertensive heart disease with heart failure: Secondary | ICD-10-CM | POA: Diagnosis not present

## 2020-09-21 DIAGNOSIS — E1151 Type 2 diabetes mellitus with diabetic peripheral angiopathy without gangrene: Secondary | ICD-10-CM | POA: Diagnosis not present

## 2020-09-21 DIAGNOSIS — I5032 Chronic diastolic (congestive) heart failure: Secondary | ICD-10-CM | POA: Diagnosis not present

## 2020-09-21 DIAGNOSIS — L97818 Non-pressure chronic ulcer of other part of right lower leg with other specified severity: Secondary | ICD-10-CM | POA: Diagnosis not present

## 2020-09-21 DIAGNOSIS — Z4800 Encounter for change or removal of nonsurgical wound dressing: Secondary | ICD-10-CM | POA: Diagnosis not present

## 2020-09-21 DIAGNOSIS — U071 COVID-19: Secondary | ICD-10-CM | POA: Diagnosis not present

## 2020-09-21 DIAGNOSIS — I872 Venous insufficiency (chronic) (peripheral): Secondary | ICD-10-CM | POA: Diagnosis not present

## 2020-09-25 DIAGNOSIS — E1151 Type 2 diabetes mellitus with diabetic peripheral angiopathy without gangrene: Secondary | ICD-10-CM | POA: Diagnosis not present

## 2020-09-25 DIAGNOSIS — I110 Hypertensive heart disease with heart failure: Secondary | ICD-10-CM | POA: Diagnosis not present

## 2020-09-25 DIAGNOSIS — I5032 Chronic diastolic (congestive) heart failure: Secondary | ICD-10-CM | POA: Diagnosis not present

## 2020-09-25 DIAGNOSIS — I872 Venous insufficiency (chronic) (peripheral): Secondary | ICD-10-CM | POA: Diagnosis not present

## 2020-09-25 DIAGNOSIS — Z4800 Encounter for change or removal of nonsurgical wound dressing: Secondary | ICD-10-CM | POA: Diagnosis not present

## 2020-09-25 DIAGNOSIS — L97818 Non-pressure chronic ulcer of other part of right lower leg with other specified severity: Secondary | ICD-10-CM | POA: Diagnosis not present

## 2020-09-26 DIAGNOSIS — E785 Hyperlipidemia, unspecified: Secondary | ICD-10-CM | POA: Diagnosis not present

## 2020-09-26 DIAGNOSIS — I10 Essential (primary) hypertension: Secondary | ICD-10-CM | POA: Diagnosis not present

## 2020-09-26 DIAGNOSIS — I5032 Chronic diastolic (congestive) heart failure: Secondary | ICD-10-CM | POA: Diagnosis not present

## 2020-09-26 DIAGNOSIS — G8929 Other chronic pain: Secondary | ICD-10-CM | POA: Diagnosis not present

## 2020-09-26 DIAGNOSIS — M1990 Unspecified osteoarthritis, unspecified site: Secondary | ICD-10-CM | POA: Diagnosis not present

## 2020-09-26 DIAGNOSIS — G4700 Insomnia, unspecified: Secondary | ICD-10-CM | POA: Diagnosis not present

## 2020-09-26 DIAGNOSIS — E119 Type 2 diabetes mellitus without complications: Secondary | ICD-10-CM | POA: Diagnosis not present

## 2020-09-27 DIAGNOSIS — I110 Hypertensive heart disease with heart failure: Secondary | ICD-10-CM | POA: Diagnosis not present

## 2020-09-27 DIAGNOSIS — L97818 Non-pressure chronic ulcer of other part of right lower leg with other specified severity: Secondary | ICD-10-CM | POA: Diagnosis not present

## 2020-09-27 DIAGNOSIS — I872 Venous insufficiency (chronic) (peripheral): Secondary | ICD-10-CM | POA: Diagnosis not present

## 2020-09-27 DIAGNOSIS — I5032 Chronic diastolic (congestive) heart failure: Secondary | ICD-10-CM | POA: Diagnosis not present

## 2020-09-27 DIAGNOSIS — Z4800 Encounter for change or removal of nonsurgical wound dressing: Secondary | ICD-10-CM | POA: Diagnosis not present

## 2020-09-27 DIAGNOSIS — E1151 Type 2 diabetes mellitus with diabetic peripheral angiopathy without gangrene: Secondary | ICD-10-CM | POA: Diagnosis not present

## 2020-10-02 DIAGNOSIS — E038 Other specified hypothyroidism: Secondary | ICD-10-CM | POA: Diagnosis not present

## 2020-10-02 DIAGNOSIS — E119 Type 2 diabetes mellitus without complications: Secondary | ICD-10-CM | POA: Diagnosis not present

## 2020-10-02 DIAGNOSIS — I872 Venous insufficiency (chronic) (peripheral): Secondary | ICD-10-CM | POA: Diagnosis not present

## 2020-10-02 DIAGNOSIS — L97818 Non-pressure chronic ulcer of other part of right lower leg with other specified severity: Secondary | ICD-10-CM | POA: Diagnosis not present

## 2020-10-02 DIAGNOSIS — Z4800 Encounter for change or removal of nonsurgical wound dressing: Secondary | ICD-10-CM | POA: Diagnosis not present

## 2020-10-02 DIAGNOSIS — I10 Essential (primary) hypertension: Secondary | ICD-10-CM | POA: Diagnosis not present

## 2020-10-02 DIAGNOSIS — I509 Heart failure, unspecified: Secondary | ICD-10-CM | POA: Diagnosis not present

## 2020-10-02 DIAGNOSIS — D518 Other vitamin B12 deficiency anemias: Secondary | ICD-10-CM | POA: Diagnosis not present

## 2020-10-02 DIAGNOSIS — E1151 Type 2 diabetes mellitus with diabetic peripheral angiopathy without gangrene: Secondary | ICD-10-CM | POA: Diagnosis not present

## 2020-10-02 DIAGNOSIS — I5032 Chronic diastolic (congestive) heart failure: Secondary | ICD-10-CM | POA: Diagnosis not present

## 2020-10-02 DIAGNOSIS — E785 Hyperlipidemia, unspecified: Secondary | ICD-10-CM | POA: Diagnosis not present

## 2020-10-02 DIAGNOSIS — I110 Hypertensive heart disease with heart failure: Secondary | ICD-10-CM | POA: Diagnosis not present

## 2020-10-02 DIAGNOSIS — M1990 Unspecified osteoarthritis, unspecified site: Secondary | ICD-10-CM | POA: Diagnosis not present

## 2020-10-02 DIAGNOSIS — E559 Vitamin D deficiency, unspecified: Secondary | ICD-10-CM | POA: Diagnosis not present

## 2020-10-05 DIAGNOSIS — I5032 Chronic diastolic (congestive) heart failure: Secondary | ICD-10-CM | POA: Diagnosis not present

## 2020-10-05 DIAGNOSIS — I110 Hypertensive heart disease with heart failure: Secondary | ICD-10-CM | POA: Diagnosis not present

## 2020-10-05 DIAGNOSIS — E1151 Type 2 diabetes mellitus with diabetic peripheral angiopathy without gangrene: Secondary | ICD-10-CM | POA: Diagnosis not present

## 2020-10-05 DIAGNOSIS — Z4800 Encounter for change or removal of nonsurgical wound dressing: Secondary | ICD-10-CM | POA: Diagnosis not present

## 2020-10-05 DIAGNOSIS — I872 Venous insufficiency (chronic) (peripheral): Secondary | ICD-10-CM | POA: Diagnosis not present

## 2020-10-05 DIAGNOSIS — L97818 Non-pressure chronic ulcer of other part of right lower leg with other specified severity: Secondary | ICD-10-CM | POA: Diagnosis not present

## 2020-10-09 DIAGNOSIS — Z4800 Encounter for change or removal of nonsurgical wound dressing: Secondary | ICD-10-CM | POA: Diagnosis not present

## 2020-10-09 DIAGNOSIS — L97818 Non-pressure chronic ulcer of other part of right lower leg with other specified severity: Secondary | ICD-10-CM | POA: Diagnosis not present

## 2020-10-09 DIAGNOSIS — I5032 Chronic diastolic (congestive) heart failure: Secondary | ICD-10-CM | POA: Diagnosis not present

## 2020-10-09 DIAGNOSIS — I110 Hypertensive heart disease with heart failure: Secondary | ICD-10-CM | POA: Diagnosis not present

## 2020-10-09 DIAGNOSIS — I872 Venous insufficiency (chronic) (peripheral): Secondary | ICD-10-CM | POA: Diagnosis not present

## 2020-10-09 DIAGNOSIS — E1151 Type 2 diabetes mellitus with diabetic peripheral angiopathy without gangrene: Secondary | ICD-10-CM | POA: Diagnosis not present

## 2020-10-12 DIAGNOSIS — Z4800 Encounter for change or removal of nonsurgical wound dressing: Secondary | ICD-10-CM | POA: Diagnosis not present

## 2020-10-12 DIAGNOSIS — E1151 Type 2 diabetes mellitus with diabetic peripheral angiopathy without gangrene: Secondary | ICD-10-CM | POA: Diagnosis not present

## 2020-10-12 DIAGNOSIS — I5032 Chronic diastolic (congestive) heart failure: Secondary | ICD-10-CM | POA: Diagnosis not present

## 2020-10-12 DIAGNOSIS — L97818 Non-pressure chronic ulcer of other part of right lower leg with other specified severity: Secondary | ICD-10-CM | POA: Diagnosis not present

## 2020-10-12 DIAGNOSIS — I110 Hypertensive heart disease with heart failure: Secondary | ICD-10-CM | POA: Diagnosis not present

## 2020-10-12 DIAGNOSIS — I872 Venous insufficiency (chronic) (peripheral): Secondary | ICD-10-CM | POA: Diagnosis not present

## 2020-10-16 DIAGNOSIS — E1151 Type 2 diabetes mellitus with diabetic peripheral angiopathy without gangrene: Secondary | ICD-10-CM | POA: Diagnosis not present

## 2020-10-16 DIAGNOSIS — I5032 Chronic diastolic (congestive) heart failure: Secondary | ICD-10-CM | POA: Diagnosis not present

## 2020-10-16 DIAGNOSIS — Z4800 Encounter for change or removal of nonsurgical wound dressing: Secondary | ICD-10-CM | POA: Diagnosis not present

## 2020-10-16 DIAGNOSIS — L97818 Non-pressure chronic ulcer of other part of right lower leg with other specified severity: Secondary | ICD-10-CM | POA: Diagnosis not present

## 2020-10-16 DIAGNOSIS — I110 Hypertensive heart disease with heart failure: Secondary | ICD-10-CM | POA: Diagnosis not present

## 2020-10-16 DIAGNOSIS — I872 Venous insufficiency (chronic) (peripheral): Secondary | ICD-10-CM | POA: Diagnosis not present

## 2020-10-19 DIAGNOSIS — L97818 Non-pressure chronic ulcer of other part of right lower leg with other specified severity: Secondary | ICD-10-CM | POA: Diagnosis not present

## 2020-10-19 DIAGNOSIS — I5032 Chronic diastolic (congestive) heart failure: Secondary | ICD-10-CM | POA: Diagnosis not present

## 2020-10-19 DIAGNOSIS — I110 Hypertensive heart disease with heart failure: Secondary | ICD-10-CM | POA: Diagnosis not present

## 2020-10-19 DIAGNOSIS — I872 Venous insufficiency (chronic) (peripheral): Secondary | ICD-10-CM | POA: Diagnosis not present

## 2020-10-19 DIAGNOSIS — E1151 Type 2 diabetes mellitus with diabetic peripheral angiopathy without gangrene: Secondary | ICD-10-CM | POA: Diagnosis not present

## 2020-10-19 DIAGNOSIS — Z4800 Encounter for change or removal of nonsurgical wound dressing: Secondary | ICD-10-CM | POA: Diagnosis not present

## 2020-10-23 DIAGNOSIS — I110 Hypertensive heart disease with heart failure: Secondary | ICD-10-CM | POA: Diagnosis not present

## 2020-10-23 DIAGNOSIS — L97818 Non-pressure chronic ulcer of other part of right lower leg with other specified severity: Secondary | ICD-10-CM | POA: Diagnosis not present

## 2020-10-23 DIAGNOSIS — E1151 Type 2 diabetes mellitus with diabetic peripheral angiopathy without gangrene: Secondary | ICD-10-CM | POA: Diagnosis not present

## 2020-10-23 DIAGNOSIS — Z4800 Encounter for change or removal of nonsurgical wound dressing: Secondary | ICD-10-CM | POA: Diagnosis not present

## 2020-10-23 DIAGNOSIS — I5032 Chronic diastolic (congestive) heart failure: Secondary | ICD-10-CM | POA: Diagnosis not present

## 2020-10-23 DIAGNOSIS — I872 Venous insufficiency (chronic) (peripheral): Secondary | ICD-10-CM | POA: Diagnosis not present

## 2020-10-24 DIAGNOSIS — E119 Type 2 diabetes mellitus without complications: Secondary | ICD-10-CM | POA: Diagnosis not present

## 2020-10-24 DIAGNOSIS — M1990 Unspecified osteoarthritis, unspecified site: Secondary | ICD-10-CM | POA: Diagnosis not present

## 2020-10-24 DIAGNOSIS — E785 Hyperlipidemia, unspecified: Secondary | ICD-10-CM | POA: Diagnosis not present

## 2020-10-24 DIAGNOSIS — I5032 Chronic diastolic (congestive) heart failure: Secondary | ICD-10-CM | POA: Diagnosis not present

## 2020-10-24 DIAGNOSIS — E559 Vitamin D deficiency, unspecified: Secondary | ICD-10-CM | POA: Diagnosis not present

## 2020-10-24 DIAGNOSIS — I10 Essential (primary) hypertension: Secondary | ICD-10-CM | POA: Diagnosis not present

## 2020-10-26 DIAGNOSIS — I110 Hypertensive heart disease with heart failure: Secondary | ICD-10-CM | POA: Diagnosis not present

## 2020-10-26 DIAGNOSIS — L97818 Non-pressure chronic ulcer of other part of right lower leg with other specified severity: Secondary | ICD-10-CM | POA: Diagnosis not present

## 2020-10-26 DIAGNOSIS — I5032 Chronic diastolic (congestive) heart failure: Secondary | ICD-10-CM | POA: Diagnosis not present

## 2020-10-26 DIAGNOSIS — E1151 Type 2 diabetes mellitus with diabetic peripheral angiopathy without gangrene: Secondary | ICD-10-CM | POA: Diagnosis not present

## 2020-10-26 DIAGNOSIS — Z4800 Encounter for change or removal of nonsurgical wound dressing: Secondary | ICD-10-CM | POA: Diagnosis not present

## 2020-10-26 DIAGNOSIS — I872 Venous insufficiency (chronic) (peripheral): Secondary | ICD-10-CM | POA: Diagnosis not present

## 2020-10-30 DIAGNOSIS — I110 Hypertensive heart disease with heart failure: Secondary | ICD-10-CM | POA: Diagnosis not present

## 2020-10-30 DIAGNOSIS — Z4800 Encounter for change or removal of nonsurgical wound dressing: Secondary | ICD-10-CM | POA: Diagnosis not present

## 2020-10-30 DIAGNOSIS — I872 Venous insufficiency (chronic) (peripheral): Secondary | ICD-10-CM | POA: Diagnosis not present

## 2020-10-30 DIAGNOSIS — E1151 Type 2 diabetes mellitus with diabetic peripheral angiopathy without gangrene: Secondary | ICD-10-CM | POA: Diagnosis not present

## 2020-10-30 DIAGNOSIS — I5032 Chronic diastolic (congestive) heart failure: Secondary | ICD-10-CM | POA: Diagnosis not present

## 2020-10-30 DIAGNOSIS — L97818 Non-pressure chronic ulcer of other part of right lower leg with other specified severity: Secondary | ICD-10-CM | POA: Diagnosis not present

## 2020-11-02 DIAGNOSIS — I5032 Chronic diastolic (congestive) heart failure: Secondary | ICD-10-CM | POA: Diagnosis not present

## 2020-11-02 DIAGNOSIS — I110 Hypertensive heart disease with heart failure: Secondary | ICD-10-CM | POA: Diagnosis not present

## 2020-11-02 DIAGNOSIS — I872 Venous insufficiency (chronic) (peripheral): Secondary | ICD-10-CM | POA: Diagnosis not present

## 2020-11-02 DIAGNOSIS — E1151 Type 2 diabetes mellitus with diabetic peripheral angiopathy without gangrene: Secondary | ICD-10-CM | POA: Diagnosis not present

## 2020-11-02 DIAGNOSIS — Z4800 Encounter for change or removal of nonsurgical wound dressing: Secondary | ICD-10-CM | POA: Diagnosis not present

## 2020-11-02 DIAGNOSIS — L97818 Non-pressure chronic ulcer of other part of right lower leg with other specified severity: Secondary | ICD-10-CM | POA: Diagnosis not present

## 2020-11-05 DIAGNOSIS — E038 Other specified hypothyroidism: Secondary | ICD-10-CM | POA: Diagnosis not present

## 2020-11-05 DIAGNOSIS — I509 Heart failure, unspecified: Secondary | ICD-10-CM | POA: Diagnosis not present

## 2020-11-05 DIAGNOSIS — I110 Hypertensive heart disease with heart failure: Secondary | ICD-10-CM | POA: Diagnosis not present

## 2020-11-05 DIAGNOSIS — L97818 Non-pressure chronic ulcer of other part of right lower leg with other specified severity: Secondary | ICD-10-CM | POA: Diagnosis not present

## 2020-11-05 DIAGNOSIS — E119 Type 2 diabetes mellitus without complications: Secondary | ICD-10-CM | POA: Diagnosis not present

## 2020-11-05 DIAGNOSIS — E1151 Type 2 diabetes mellitus with diabetic peripheral angiopathy without gangrene: Secondary | ICD-10-CM | POA: Diagnosis not present

## 2020-11-05 DIAGNOSIS — Z4800 Encounter for change or removal of nonsurgical wound dressing: Secondary | ICD-10-CM | POA: Diagnosis not present

## 2020-11-05 DIAGNOSIS — E785 Hyperlipidemia, unspecified: Secondary | ICD-10-CM | POA: Diagnosis not present

## 2020-11-05 DIAGNOSIS — D518 Other vitamin B12 deficiency anemias: Secondary | ICD-10-CM | POA: Diagnosis not present

## 2020-11-05 DIAGNOSIS — E559 Vitamin D deficiency, unspecified: Secondary | ICD-10-CM | POA: Diagnosis not present

## 2020-11-05 DIAGNOSIS — I10 Essential (primary) hypertension: Secondary | ICD-10-CM | POA: Diagnosis not present

## 2020-11-05 DIAGNOSIS — M1990 Unspecified osteoarthritis, unspecified site: Secondary | ICD-10-CM | POA: Diagnosis not present

## 2020-11-05 DIAGNOSIS — I5032 Chronic diastolic (congestive) heart failure: Secondary | ICD-10-CM | POA: Diagnosis not present

## 2020-11-05 DIAGNOSIS — I872 Venous insufficiency (chronic) (peripheral): Secondary | ICD-10-CM | POA: Diagnosis not present

## 2020-11-06 DIAGNOSIS — I110 Hypertensive heart disease with heart failure: Secondary | ICD-10-CM | POA: Diagnosis not present

## 2020-11-06 DIAGNOSIS — E1151 Type 2 diabetes mellitus with diabetic peripheral angiopathy without gangrene: Secondary | ICD-10-CM | POA: Diagnosis not present

## 2020-11-06 DIAGNOSIS — Z4800 Encounter for change or removal of nonsurgical wound dressing: Secondary | ICD-10-CM | POA: Diagnosis not present

## 2020-11-06 DIAGNOSIS — I872 Venous insufficiency (chronic) (peripheral): Secondary | ICD-10-CM | POA: Diagnosis not present

## 2020-11-06 DIAGNOSIS — I5032 Chronic diastolic (congestive) heart failure: Secondary | ICD-10-CM | POA: Diagnosis not present

## 2020-11-06 DIAGNOSIS — L97818 Non-pressure chronic ulcer of other part of right lower leg with other specified severity: Secondary | ICD-10-CM | POA: Diagnosis not present

## 2020-11-08 DIAGNOSIS — E1151 Type 2 diabetes mellitus with diabetic peripheral angiopathy without gangrene: Secondary | ICD-10-CM | POA: Diagnosis not present

## 2020-11-08 DIAGNOSIS — Z4800 Encounter for change or removal of nonsurgical wound dressing: Secondary | ICD-10-CM | POA: Diagnosis not present

## 2020-11-08 DIAGNOSIS — L97818 Non-pressure chronic ulcer of other part of right lower leg with other specified severity: Secondary | ICD-10-CM | POA: Diagnosis not present

## 2020-11-08 DIAGNOSIS — I5032 Chronic diastolic (congestive) heart failure: Secondary | ICD-10-CM | POA: Diagnosis not present

## 2020-11-08 DIAGNOSIS — I872 Venous insufficiency (chronic) (peripheral): Secondary | ICD-10-CM | POA: Diagnosis not present

## 2020-11-08 DIAGNOSIS — I110 Hypertensive heart disease with heart failure: Secondary | ICD-10-CM | POA: Diagnosis not present

## 2020-11-13 DIAGNOSIS — I872 Venous insufficiency (chronic) (peripheral): Secondary | ICD-10-CM | POA: Diagnosis not present

## 2020-11-13 DIAGNOSIS — E1151 Type 2 diabetes mellitus with diabetic peripheral angiopathy without gangrene: Secondary | ICD-10-CM | POA: Diagnosis not present

## 2020-11-13 DIAGNOSIS — L97818 Non-pressure chronic ulcer of other part of right lower leg with other specified severity: Secondary | ICD-10-CM | POA: Diagnosis not present

## 2020-11-13 DIAGNOSIS — B351 Tinea unguium: Secondary | ICD-10-CM | POA: Diagnosis not present

## 2020-11-13 DIAGNOSIS — Z4800 Encounter for change or removal of nonsurgical wound dressing: Secondary | ICD-10-CM | POA: Diagnosis not present

## 2020-11-13 DIAGNOSIS — I5032 Chronic diastolic (congestive) heart failure: Secondary | ICD-10-CM | POA: Diagnosis not present

## 2020-11-13 DIAGNOSIS — I110 Hypertensive heart disease with heart failure: Secondary | ICD-10-CM | POA: Diagnosis not present

## 2020-11-13 DIAGNOSIS — M79676 Pain in unspecified toe(s): Secondary | ICD-10-CM | POA: Diagnosis not present

## 2020-11-15 DIAGNOSIS — I5032 Chronic diastolic (congestive) heart failure: Secondary | ICD-10-CM | POA: Diagnosis not present

## 2020-11-15 DIAGNOSIS — E1151 Type 2 diabetes mellitus with diabetic peripheral angiopathy without gangrene: Secondary | ICD-10-CM | POA: Diagnosis not present

## 2020-11-15 DIAGNOSIS — I110 Hypertensive heart disease with heart failure: Secondary | ICD-10-CM | POA: Diagnosis not present

## 2020-11-15 DIAGNOSIS — L97818 Non-pressure chronic ulcer of other part of right lower leg with other specified severity: Secondary | ICD-10-CM | POA: Diagnosis not present

## 2020-11-15 DIAGNOSIS — Z4800 Encounter for change or removal of nonsurgical wound dressing: Secondary | ICD-10-CM | POA: Diagnosis not present

## 2020-11-15 DIAGNOSIS — I872 Venous insufficiency (chronic) (peripheral): Secondary | ICD-10-CM | POA: Diagnosis not present

## 2020-11-19 DIAGNOSIS — E1151 Type 2 diabetes mellitus with diabetic peripheral angiopathy without gangrene: Secondary | ICD-10-CM | POA: Diagnosis not present

## 2020-11-19 DIAGNOSIS — I872 Venous insufficiency (chronic) (peripheral): Secondary | ICD-10-CM | POA: Diagnosis not present

## 2020-11-19 DIAGNOSIS — Z4800 Encounter for change or removal of nonsurgical wound dressing: Secondary | ICD-10-CM | POA: Diagnosis not present

## 2020-11-19 DIAGNOSIS — I5032 Chronic diastolic (congestive) heart failure: Secondary | ICD-10-CM | POA: Diagnosis not present

## 2020-11-19 DIAGNOSIS — I110 Hypertensive heart disease with heart failure: Secondary | ICD-10-CM | POA: Diagnosis not present

## 2020-11-19 DIAGNOSIS — L97818 Non-pressure chronic ulcer of other part of right lower leg with other specified severity: Secondary | ICD-10-CM | POA: Diagnosis not present

## 2020-11-21 DIAGNOSIS — E119 Type 2 diabetes mellitus without complications: Secondary | ICD-10-CM | POA: Diagnosis not present

## 2020-11-21 DIAGNOSIS — E785 Hyperlipidemia, unspecified: Secondary | ICD-10-CM | POA: Diagnosis not present

## 2020-11-21 DIAGNOSIS — E559 Vitamin D deficiency, unspecified: Secondary | ICD-10-CM | POA: Diagnosis not present

## 2020-11-21 DIAGNOSIS — G4700 Insomnia, unspecified: Secondary | ICD-10-CM | POA: Diagnosis not present

## 2020-11-21 DIAGNOSIS — I10 Essential (primary) hypertension: Secondary | ICD-10-CM | POA: Diagnosis not present

## 2020-11-21 DIAGNOSIS — I5032 Chronic diastolic (congestive) heart failure: Secondary | ICD-10-CM | POA: Diagnosis not present

## 2020-11-22 DIAGNOSIS — I110 Hypertensive heart disease with heart failure: Secondary | ICD-10-CM | POA: Diagnosis not present

## 2020-11-22 DIAGNOSIS — I872 Venous insufficiency (chronic) (peripheral): Secondary | ICD-10-CM | POA: Diagnosis not present

## 2020-11-22 DIAGNOSIS — I5032 Chronic diastolic (congestive) heart failure: Secondary | ICD-10-CM | POA: Diagnosis not present

## 2020-11-22 DIAGNOSIS — Z4800 Encounter for change or removal of nonsurgical wound dressing: Secondary | ICD-10-CM | POA: Diagnosis not present

## 2020-11-22 DIAGNOSIS — L97818 Non-pressure chronic ulcer of other part of right lower leg with other specified severity: Secondary | ICD-10-CM | POA: Diagnosis not present

## 2020-11-22 DIAGNOSIS — E1151 Type 2 diabetes mellitus with diabetic peripheral angiopathy without gangrene: Secondary | ICD-10-CM | POA: Diagnosis not present

## 2020-11-26 DIAGNOSIS — I872 Venous insufficiency (chronic) (peripheral): Secondary | ICD-10-CM | POA: Diagnosis not present

## 2020-11-26 DIAGNOSIS — I110 Hypertensive heart disease with heart failure: Secondary | ICD-10-CM | POA: Diagnosis not present

## 2020-11-26 DIAGNOSIS — L97818 Non-pressure chronic ulcer of other part of right lower leg with other specified severity: Secondary | ICD-10-CM | POA: Diagnosis not present

## 2020-11-26 DIAGNOSIS — Z4800 Encounter for change or removal of nonsurgical wound dressing: Secondary | ICD-10-CM | POA: Diagnosis not present

## 2020-11-26 DIAGNOSIS — E1151 Type 2 diabetes mellitus with diabetic peripheral angiopathy without gangrene: Secondary | ICD-10-CM | POA: Diagnosis not present

## 2020-11-26 DIAGNOSIS — I5032 Chronic diastolic (congestive) heart failure: Secondary | ICD-10-CM | POA: Diagnosis not present

## 2020-11-27 DIAGNOSIS — E559 Vitamin D deficiency, unspecified: Secondary | ICD-10-CM | POA: Diagnosis not present

## 2020-11-27 DIAGNOSIS — M1990 Unspecified osteoarthritis, unspecified site: Secondary | ICD-10-CM | POA: Diagnosis not present

## 2020-11-27 DIAGNOSIS — I509 Heart failure, unspecified: Secondary | ICD-10-CM | POA: Diagnosis not present

## 2020-11-27 DIAGNOSIS — I5032 Chronic diastolic (congestive) heart failure: Secondary | ICD-10-CM | POA: Diagnosis not present

## 2020-11-27 DIAGNOSIS — E038 Other specified hypothyroidism: Secondary | ICD-10-CM | POA: Diagnosis not present

## 2020-11-27 DIAGNOSIS — I10 Essential (primary) hypertension: Secondary | ICD-10-CM | POA: Diagnosis not present

## 2020-11-27 DIAGNOSIS — E785 Hyperlipidemia, unspecified: Secondary | ICD-10-CM | POA: Diagnosis not present

## 2020-11-27 DIAGNOSIS — D518 Other vitamin B12 deficiency anemias: Secondary | ICD-10-CM | POA: Diagnosis not present

## 2020-11-27 DIAGNOSIS — E119 Type 2 diabetes mellitus without complications: Secondary | ICD-10-CM | POA: Diagnosis not present

## 2020-11-29 DIAGNOSIS — I5032 Chronic diastolic (congestive) heart failure: Secondary | ICD-10-CM | POA: Diagnosis not present

## 2020-11-29 DIAGNOSIS — Z4800 Encounter for change or removal of nonsurgical wound dressing: Secondary | ICD-10-CM | POA: Diagnosis not present

## 2020-11-29 DIAGNOSIS — E1151 Type 2 diabetes mellitus with diabetic peripheral angiopathy without gangrene: Secondary | ICD-10-CM | POA: Diagnosis not present

## 2020-11-29 DIAGNOSIS — L97818 Non-pressure chronic ulcer of other part of right lower leg with other specified severity: Secondary | ICD-10-CM | POA: Diagnosis not present

## 2020-11-29 DIAGNOSIS — I872 Venous insufficiency (chronic) (peripheral): Secondary | ICD-10-CM | POA: Diagnosis not present

## 2020-11-29 DIAGNOSIS — I110 Hypertensive heart disease with heart failure: Secondary | ICD-10-CM | POA: Diagnosis not present

## 2020-12-04 DIAGNOSIS — L97818 Non-pressure chronic ulcer of other part of right lower leg with other specified severity: Secondary | ICD-10-CM | POA: Diagnosis not present

## 2020-12-04 DIAGNOSIS — Z4800 Encounter for change or removal of nonsurgical wound dressing: Secondary | ICD-10-CM | POA: Diagnosis not present

## 2020-12-04 DIAGNOSIS — I5032 Chronic diastolic (congestive) heart failure: Secondary | ICD-10-CM | POA: Diagnosis not present

## 2020-12-04 DIAGNOSIS — I110 Hypertensive heart disease with heart failure: Secondary | ICD-10-CM | POA: Diagnosis not present

## 2020-12-04 DIAGNOSIS — E1151 Type 2 diabetes mellitus with diabetic peripheral angiopathy without gangrene: Secondary | ICD-10-CM | POA: Diagnosis not present

## 2020-12-04 DIAGNOSIS — I872 Venous insufficiency (chronic) (peripheral): Secondary | ICD-10-CM | POA: Diagnosis not present

## 2020-12-07 DIAGNOSIS — E1151 Type 2 diabetes mellitus with diabetic peripheral angiopathy without gangrene: Secondary | ICD-10-CM | POA: Diagnosis not present

## 2020-12-07 DIAGNOSIS — I5032 Chronic diastolic (congestive) heart failure: Secondary | ICD-10-CM | POA: Diagnosis not present

## 2020-12-07 DIAGNOSIS — I110 Hypertensive heart disease with heart failure: Secondary | ICD-10-CM | POA: Diagnosis not present

## 2020-12-07 DIAGNOSIS — L97818 Non-pressure chronic ulcer of other part of right lower leg with other specified severity: Secondary | ICD-10-CM | POA: Diagnosis not present

## 2020-12-07 DIAGNOSIS — Z4800 Encounter for change or removal of nonsurgical wound dressing: Secondary | ICD-10-CM | POA: Diagnosis not present

## 2020-12-07 DIAGNOSIS — I872 Venous insufficiency (chronic) (peripheral): Secondary | ICD-10-CM | POA: Diagnosis not present

## 2020-12-11 DIAGNOSIS — L97818 Non-pressure chronic ulcer of other part of right lower leg with other specified severity: Secondary | ICD-10-CM | POA: Diagnosis not present

## 2020-12-11 DIAGNOSIS — I10 Essential (primary) hypertension: Secondary | ICD-10-CM | POA: Diagnosis not present

## 2020-12-11 DIAGNOSIS — E785 Hyperlipidemia, unspecified: Secondary | ICD-10-CM | POA: Diagnosis not present

## 2020-12-11 DIAGNOSIS — I110 Hypertensive heart disease with heart failure: Secondary | ICD-10-CM | POA: Diagnosis not present

## 2020-12-11 DIAGNOSIS — Z4800 Encounter for change or removal of nonsurgical wound dressing: Secondary | ICD-10-CM | POA: Diagnosis not present

## 2020-12-11 DIAGNOSIS — I5032 Chronic diastolic (congestive) heart failure: Secondary | ICD-10-CM | POA: Diagnosis not present

## 2020-12-11 DIAGNOSIS — I2510 Atherosclerotic heart disease of native coronary artery without angina pectoris: Secondary | ICD-10-CM | POA: Diagnosis not present

## 2020-12-11 DIAGNOSIS — I872 Venous insufficiency (chronic) (peripheral): Secondary | ICD-10-CM | POA: Diagnosis not present

## 2020-12-11 DIAGNOSIS — E1151 Type 2 diabetes mellitus with diabetic peripheral angiopathy without gangrene: Secondary | ICD-10-CM | POA: Diagnosis not present

## 2020-12-13 DIAGNOSIS — Z4800 Encounter for change or removal of nonsurgical wound dressing: Secondary | ICD-10-CM | POA: Diagnosis not present

## 2020-12-13 DIAGNOSIS — E1151 Type 2 diabetes mellitus with diabetic peripheral angiopathy without gangrene: Secondary | ICD-10-CM | POA: Diagnosis not present

## 2020-12-13 DIAGNOSIS — I872 Venous insufficiency (chronic) (peripheral): Secondary | ICD-10-CM | POA: Diagnosis not present

## 2020-12-13 DIAGNOSIS — L97818 Non-pressure chronic ulcer of other part of right lower leg with other specified severity: Secondary | ICD-10-CM | POA: Diagnosis not present

## 2020-12-13 DIAGNOSIS — I110 Hypertensive heart disease with heart failure: Secondary | ICD-10-CM | POA: Diagnosis not present

## 2020-12-13 DIAGNOSIS — I5032 Chronic diastolic (congestive) heart failure: Secondary | ICD-10-CM | POA: Diagnosis not present

## 2020-12-17 DIAGNOSIS — Z4800 Encounter for change or removal of nonsurgical wound dressing: Secondary | ICD-10-CM | POA: Diagnosis not present

## 2020-12-17 DIAGNOSIS — L97818 Non-pressure chronic ulcer of other part of right lower leg with other specified severity: Secondary | ICD-10-CM | POA: Diagnosis not present

## 2020-12-17 DIAGNOSIS — I872 Venous insufficiency (chronic) (peripheral): Secondary | ICD-10-CM | POA: Diagnosis not present

## 2020-12-17 DIAGNOSIS — I5032 Chronic diastolic (congestive) heart failure: Secondary | ICD-10-CM | POA: Diagnosis not present

## 2020-12-17 DIAGNOSIS — E1151 Type 2 diabetes mellitus with diabetic peripheral angiopathy without gangrene: Secondary | ICD-10-CM | POA: Diagnosis not present

## 2020-12-17 DIAGNOSIS — I110 Hypertensive heart disease with heart failure: Secondary | ICD-10-CM | POA: Diagnosis not present

## 2020-12-19 DIAGNOSIS — I5032 Chronic diastolic (congestive) heart failure: Secondary | ICD-10-CM | POA: Diagnosis not present

## 2020-12-19 DIAGNOSIS — G8929 Other chronic pain: Secondary | ICD-10-CM | POA: Diagnosis not present

## 2020-12-19 DIAGNOSIS — I10 Essential (primary) hypertension: Secondary | ICD-10-CM | POA: Diagnosis not present

## 2020-12-19 DIAGNOSIS — M1990 Unspecified osteoarthritis, unspecified site: Secondary | ICD-10-CM | POA: Diagnosis not present

## 2020-12-19 DIAGNOSIS — G4700 Insomnia, unspecified: Secondary | ICD-10-CM | POA: Diagnosis not present

## 2020-12-19 DIAGNOSIS — E785 Hyperlipidemia, unspecified: Secondary | ICD-10-CM | POA: Diagnosis not present

## 2020-12-19 DIAGNOSIS — E119 Type 2 diabetes mellitus without complications: Secondary | ICD-10-CM | POA: Diagnosis not present

## 2020-12-20 DIAGNOSIS — I872 Venous insufficiency (chronic) (peripheral): Secondary | ICD-10-CM | POA: Diagnosis not present

## 2020-12-20 DIAGNOSIS — I5032 Chronic diastolic (congestive) heart failure: Secondary | ICD-10-CM | POA: Diagnosis not present

## 2020-12-20 DIAGNOSIS — I110 Hypertensive heart disease with heart failure: Secondary | ICD-10-CM | POA: Diagnosis not present

## 2020-12-20 DIAGNOSIS — E038 Other specified hypothyroidism: Secondary | ICD-10-CM | POA: Diagnosis not present

## 2020-12-20 DIAGNOSIS — E1151 Type 2 diabetes mellitus with diabetic peripheral angiopathy without gangrene: Secondary | ICD-10-CM | POA: Diagnosis not present

## 2020-12-20 DIAGNOSIS — E119 Type 2 diabetes mellitus without complications: Secondary | ICD-10-CM | POA: Diagnosis not present

## 2020-12-20 DIAGNOSIS — I509 Heart failure, unspecified: Secondary | ICD-10-CM | POA: Diagnosis not present

## 2020-12-20 DIAGNOSIS — Z4800 Encounter for change or removal of nonsurgical wound dressing: Secondary | ICD-10-CM | POA: Diagnosis not present

## 2020-12-20 DIAGNOSIS — E559 Vitamin D deficiency, unspecified: Secondary | ICD-10-CM | POA: Diagnosis not present

## 2020-12-20 DIAGNOSIS — D518 Other vitamin B12 deficiency anemias: Secondary | ICD-10-CM | POA: Diagnosis not present

## 2020-12-20 DIAGNOSIS — L97818 Non-pressure chronic ulcer of other part of right lower leg with other specified severity: Secondary | ICD-10-CM | POA: Diagnosis not present

## 2020-12-20 DIAGNOSIS — E785 Hyperlipidemia, unspecified: Secondary | ICD-10-CM | POA: Diagnosis not present

## 2020-12-20 DIAGNOSIS — I10 Essential (primary) hypertension: Secondary | ICD-10-CM | POA: Diagnosis not present

## 2020-12-20 DIAGNOSIS — M1990 Unspecified osteoarthritis, unspecified site: Secondary | ICD-10-CM | POA: Diagnosis not present

## 2020-12-24 DIAGNOSIS — L97818 Non-pressure chronic ulcer of other part of right lower leg with other specified severity: Secondary | ICD-10-CM | POA: Diagnosis not present

## 2020-12-24 DIAGNOSIS — I872 Venous insufficiency (chronic) (peripheral): Secondary | ICD-10-CM | POA: Diagnosis not present

## 2020-12-24 DIAGNOSIS — Z4800 Encounter for change or removal of nonsurgical wound dressing: Secondary | ICD-10-CM | POA: Diagnosis not present

## 2020-12-24 DIAGNOSIS — E1151 Type 2 diabetes mellitus with diabetic peripheral angiopathy without gangrene: Secondary | ICD-10-CM | POA: Diagnosis not present

## 2020-12-25 DIAGNOSIS — I872 Venous insufficiency (chronic) (peripheral): Secondary | ICD-10-CM | POA: Diagnosis not present

## 2020-12-25 DIAGNOSIS — I5032 Chronic diastolic (congestive) heart failure: Secondary | ICD-10-CM | POA: Diagnosis not present

## 2020-12-25 DIAGNOSIS — E1151 Type 2 diabetes mellitus with diabetic peripheral angiopathy without gangrene: Secondary | ICD-10-CM | POA: Diagnosis not present

## 2020-12-25 DIAGNOSIS — I110 Hypertensive heart disease with heart failure: Secondary | ICD-10-CM | POA: Diagnosis not present

## 2020-12-25 DIAGNOSIS — Z4800 Encounter for change or removal of nonsurgical wound dressing: Secondary | ICD-10-CM | POA: Diagnosis not present

## 2020-12-25 DIAGNOSIS — L97818 Non-pressure chronic ulcer of other part of right lower leg with other specified severity: Secondary | ICD-10-CM | POA: Diagnosis not present

## 2020-12-28 DIAGNOSIS — I110 Hypertensive heart disease with heart failure: Secondary | ICD-10-CM | POA: Diagnosis not present

## 2020-12-28 DIAGNOSIS — Z4800 Encounter for change or removal of nonsurgical wound dressing: Secondary | ICD-10-CM | POA: Diagnosis not present

## 2020-12-28 DIAGNOSIS — E1151 Type 2 diabetes mellitus with diabetic peripheral angiopathy without gangrene: Secondary | ICD-10-CM | POA: Diagnosis not present

## 2020-12-28 DIAGNOSIS — I5032 Chronic diastolic (congestive) heart failure: Secondary | ICD-10-CM | POA: Diagnosis not present

## 2020-12-28 DIAGNOSIS — L97818 Non-pressure chronic ulcer of other part of right lower leg with other specified severity: Secondary | ICD-10-CM | POA: Diagnosis not present

## 2020-12-28 DIAGNOSIS — I872 Venous insufficiency (chronic) (peripheral): Secondary | ICD-10-CM | POA: Diagnosis not present

## 2021-01-01 DIAGNOSIS — I872 Venous insufficiency (chronic) (peripheral): Secondary | ICD-10-CM | POA: Diagnosis not present

## 2021-01-01 DIAGNOSIS — L97818 Non-pressure chronic ulcer of other part of right lower leg with other specified severity: Secondary | ICD-10-CM | POA: Diagnosis not present

## 2021-01-01 DIAGNOSIS — E1151 Type 2 diabetes mellitus with diabetic peripheral angiopathy without gangrene: Secondary | ICD-10-CM | POA: Diagnosis not present

## 2021-01-01 DIAGNOSIS — I110 Hypertensive heart disease with heart failure: Secondary | ICD-10-CM | POA: Diagnosis not present

## 2021-01-01 DIAGNOSIS — Z4800 Encounter for change or removal of nonsurgical wound dressing: Secondary | ICD-10-CM | POA: Diagnosis not present

## 2021-01-01 DIAGNOSIS — I5032 Chronic diastolic (congestive) heart failure: Secondary | ICD-10-CM | POA: Diagnosis not present

## 2021-01-02 DIAGNOSIS — M79645 Pain in left finger(s): Secondary | ICD-10-CM | POA: Diagnosis not present

## 2021-01-02 DIAGNOSIS — I10 Essential (primary) hypertension: Secondary | ICD-10-CM | POA: Diagnosis not present

## 2021-01-02 DIAGNOSIS — E559 Vitamin D deficiency, unspecified: Secondary | ICD-10-CM | POA: Diagnosis not present

## 2021-01-02 DIAGNOSIS — L0390 Cellulitis, unspecified: Secondary | ICD-10-CM | POA: Diagnosis not present

## 2021-01-02 DIAGNOSIS — E119 Type 2 diabetes mellitus without complications: Secondary | ICD-10-CM | POA: Diagnosis not present

## 2021-01-03 DIAGNOSIS — E1151 Type 2 diabetes mellitus with diabetic peripheral angiopathy without gangrene: Secondary | ICD-10-CM | POA: Diagnosis not present

## 2021-01-03 DIAGNOSIS — I5032 Chronic diastolic (congestive) heart failure: Secondary | ICD-10-CM | POA: Diagnosis not present

## 2021-01-03 DIAGNOSIS — L97818 Non-pressure chronic ulcer of other part of right lower leg with other specified severity: Secondary | ICD-10-CM | POA: Diagnosis not present

## 2021-01-03 DIAGNOSIS — I110 Hypertensive heart disease with heart failure: Secondary | ICD-10-CM | POA: Diagnosis not present

## 2021-01-03 DIAGNOSIS — Z4800 Encounter for change or removal of nonsurgical wound dressing: Secondary | ICD-10-CM | POA: Diagnosis not present

## 2021-01-03 DIAGNOSIS — I872 Venous insufficiency (chronic) (peripheral): Secondary | ICD-10-CM | POA: Diagnosis not present

## 2021-01-04 DIAGNOSIS — E1151 Type 2 diabetes mellitus with diabetic peripheral angiopathy without gangrene: Secondary | ICD-10-CM | POA: Diagnosis not present

## 2021-01-04 DIAGNOSIS — I872 Venous insufficiency (chronic) (peripheral): Secondary | ICD-10-CM | POA: Diagnosis not present

## 2021-01-04 DIAGNOSIS — L97818 Non-pressure chronic ulcer of other part of right lower leg with other specified severity: Secondary | ICD-10-CM | POA: Diagnosis not present

## 2021-01-04 DIAGNOSIS — I5032 Chronic diastolic (congestive) heart failure: Secondary | ICD-10-CM | POA: Diagnosis not present

## 2021-01-04 DIAGNOSIS — I110 Hypertensive heart disease with heart failure: Secondary | ICD-10-CM | POA: Diagnosis not present

## 2021-01-04 DIAGNOSIS — M79642 Pain in left hand: Secondary | ICD-10-CM | POA: Diagnosis not present

## 2021-01-04 DIAGNOSIS — Z4800 Encounter for change or removal of nonsurgical wound dressing: Secondary | ICD-10-CM | POA: Diagnosis not present

## 2021-01-07 DIAGNOSIS — I872 Venous insufficiency (chronic) (peripheral): Secondary | ICD-10-CM | POA: Diagnosis not present

## 2021-01-07 DIAGNOSIS — I110 Hypertensive heart disease with heart failure: Secondary | ICD-10-CM | POA: Diagnosis not present

## 2021-01-07 DIAGNOSIS — I5032 Chronic diastolic (congestive) heart failure: Secondary | ICD-10-CM | POA: Diagnosis not present

## 2021-01-07 DIAGNOSIS — E1151 Type 2 diabetes mellitus with diabetic peripheral angiopathy without gangrene: Secondary | ICD-10-CM | POA: Diagnosis not present

## 2021-01-07 DIAGNOSIS — L97818 Non-pressure chronic ulcer of other part of right lower leg with other specified severity: Secondary | ICD-10-CM | POA: Diagnosis not present

## 2021-01-07 DIAGNOSIS — Z4800 Encounter for change or removal of nonsurgical wound dressing: Secondary | ICD-10-CM | POA: Diagnosis not present

## 2021-01-08 DIAGNOSIS — E7849 Other hyperlipidemia: Secondary | ICD-10-CM | POA: Diagnosis not present

## 2021-01-08 DIAGNOSIS — Z79899 Other long term (current) drug therapy: Secondary | ICD-10-CM | POA: Diagnosis not present

## 2021-01-08 DIAGNOSIS — E038 Other specified hypothyroidism: Secondary | ICD-10-CM | POA: Diagnosis not present

## 2021-01-08 DIAGNOSIS — E119 Type 2 diabetes mellitus without complications: Secondary | ICD-10-CM | POA: Diagnosis not present

## 2021-01-08 DIAGNOSIS — D518 Other vitamin B12 deficiency anemias: Secondary | ICD-10-CM | POA: Diagnosis not present

## 2021-01-10 DIAGNOSIS — I872 Venous insufficiency (chronic) (peripheral): Secondary | ICD-10-CM | POA: Diagnosis not present

## 2021-01-10 DIAGNOSIS — I5020 Unspecified systolic (congestive) heart failure: Secondary | ICD-10-CM | POA: Diagnosis not present

## 2021-01-10 DIAGNOSIS — E1151 Type 2 diabetes mellitus with diabetic peripheral angiopathy without gangrene: Secondary | ICD-10-CM | POA: Diagnosis not present

## 2021-01-10 DIAGNOSIS — I110 Hypertensive heart disease with heart failure: Secondary | ICD-10-CM | POA: Diagnosis not present

## 2021-01-10 DIAGNOSIS — L97818 Non-pressure chronic ulcer of other part of right lower leg with other specified severity: Secondary | ICD-10-CM | POA: Diagnosis not present

## 2021-01-10 DIAGNOSIS — Z4800 Encounter for change or removal of nonsurgical wound dressing: Secondary | ICD-10-CM | POA: Diagnosis not present

## 2021-01-10 DIAGNOSIS — I5032 Chronic diastolic (congestive) heart failure: Secondary | ICD-10-CM | POA: Diagnosis not present

## 2021-01-15 DIAGNOSIS — M1990 Unspecified osteoarthritis, unspecified site: Secondary | ICD-10-CM | POA: Diagnosis not present

## 2021-01-15 DIAGNOSIS — N1831 Chronic kidney disease, stage 3a: Secondary | ICD-10-CM | POA: Diagnosis not present

## 2021-01-15 DIAGNOSIS — E785 Hyperlipidemia, unspecified: Secondary | ICD-10-CM | POA: Diagnosis not present

## 2021-01-15 DIAGNOSIS — E119 Type 2 diabetes mellitus without complications: Secondary | ICD-10-CM | POA: Diagnosis not present

## 2021-01-15 DIAGNOSIS — I5032 Chronic diastolic (congestive) heart failure: Secondary | ICD-10-CM | POA: Diagnosis not present

## 2021-01-15 DIAGNOSIS — E559 Vitamin D deficiency, unspecified: Secondary | ICD-10-CM | POA: Diagnosis not present

## 2021-01-15 DIAGNOSIS — I10 Essential (primary) hypertension: Secondary | ICD-10-CM | POA: Diagnosis not present

## 2021-01-16 DIAGNOSIS — L97818 Non-pressure chronic ulcer of other part of right lower leg with other specified severity: Secondary | ICD-10-CM | POA: Diagnosis not present

## 2021-01-16 DIAGNOSIS — E1151 Type 2 diabetes mellitus with diabetic peripheral angiopathy without gangrene: Secondary | ICD-10-CM | POA: Diagnosis not present

## 2021-01-16 DIAGNOSIS — I872 Venous insufficiency (chronic) (peripheral): Secondary | ICD-10-CM | POA: Diagnosis not present

## 2021-01-16 DIAGNOSIS — I110 Hypertensive heart disease with heart failure: Secondary | ICD-10-CM | POA: Diagnosis not present

## 2021-01-16 DIAGNOSIS — Z4800 Encounter for change or removal of nonsurgical wound dressing: Secondary | ICD-10-CM | POA: Diagnosis not present

## 2021-01-16 DIAGNOSIS — I5032 Chronic diastolic (congestive) heart failure: Secondary | ICD-10-CM | POA: Diagnosis not present

## 2021-01-17 DIAGNOSIS — Z79899 Other long term (current) drug therapy: Secondary | ICD-10-CM | POA: Diagnosis not present

## 2021-01-17 DIAGNOSIS — E790 Hyperuricemia without signs of inflammatory arthritis and tophaceous disease: Secondary | ICD-10-CM | POA: Diagnosis not present

## 2021-01-21 DIAGNOSIS — M79642 Pain in left hand: Secondary | ICD-10-CM | POA: Diagnosis not present

## 2021-01-21 DIAGNOSIS — M109 Gout, unspecified: Secondary | ICD-10-CM | POA: Diagnosis not present

## 2021-01-23 DIAGNOSIS — I872 Venous insufficiency (chronic) (peripheral): Secondary | ICD-10-CM | POA: Diagnosis not present

## 2021-01-23 DIAGNOSIS — I5032 Chronic diastolic (congestive) heart failure: Secondary | ICD-10-CM | POA: Diagnosis not present

## 2021-01-23 DIAGNOSIS — Z4800 Encounter for change or removal of nonsurgical wound dressing: Secondary | ICD-10-CM | POA: Diagnosis not present

## 2021-01-23 DIAGNOSIS — E1151 Type 2 diabetes mellitus with diabetic peripheral angiopathy without gangrene: Secondary | ICD-10-CM | POA: Diagnosis not present

## 2021-01-23 DIAGNOSIS — I110 Hypertensive heart disease with heart failure: Secondary | ICD-10-CM | POA: Diagnosis not present

## 2021-01-23 DIAGNOSIS — L97818 Non-pressure chronic ulcer of other part of right lower leg with other specified severity: Secondary | ICD-10-CM | POA: Diagnosis not present

## 2021-01-28 DIAGNOSIS — L97818 Non-pressure chronic ulcer of other part of right lower leg with other specified severity: Secondary | ICD-10-CM | POA: Diagnosis not present

## 2021-01-28 DIAGNOSIS — I110 Hypertensive heart disease with heart failure: Secondary | ICD-10-CM | POA: Diagnosis not present

## 2021-01-28 DIAGNOSIS — E1151 Type 2 diabetes mellitus with diabetic peripheral angiopathy without gangrene: Secondary | ICD-10-CM | POA: Diagnosis not present

## 2021-01-28 DIAGNOSIS — M1990 Unspecified osteoarthritis, unspecified site: Secondary | ICD-10-CM | POA: Diagnosis not present

## 2021-01-28 DIAGNOSIS — I872 Venous insufficiency (chronic) (peripheral): Secondary | ICD-10-CM | POA: Diagnosis not present

## 2021-01-28 DIAGNOSIS — D518 Other vitamin B12 deficiency anemias: Secondary | ICD-10-CM | POA: Diagnosis not present

## 2021-01-28 DIAGNOSIS — I509 Heart failure, unspecified: Secondary | ICD-10-CM | POA: Diagnosis not present

## 2021-01-28 DIAGNOSIS — E119 Type 2 diabetes mellitus without complications: Secondary | ICD-10-CM | POA: Diagnosis not present

## 2021-01-28 DIAGNOSIS — I10 Essential (primary) hypertension: Secondary | ICD-10-CM | POA: Diagnosis not present

## 2021-01-28 DIAGNOSIS — Z4800 Encounter for change or removal of nonsurgical wound dressing: Secondary | ICD-10-CM | POA: Diagnosis not present

## 2021-01-28 DIAGNOSIS — E038 Other specified hypothyroidism: Secondary | ICD-10-CM | POA: Diagnosis not present

## 2021-01-28 DIAGNOSIS — I5032 Chronic diastolic (congestive) heart failure: Secondary | ICD-10-CM | POA: Diagnosis not present

## 2021-01-28 DIAGNOSIS — E785 Hyperlipidemia, unspecified: Secondary | ICD-10-CM | POA: Diagnosis not present

## 2021-01-28 DIAGNOSIS — E559 Vitamin D deficiency, unspecified: Secondary | ICD-10-CM | POA: Diagnosis not present

## 2021-02-04 DIAGNOSIS — D518 Other vitamin B12 deficiency anemias: Secondary | ICD-10-CM | POA: Diagnosis not present

## 2021-02-04 DIAGNOSIS — E119 Type 2 diabetes mellitus without complications: Secondary | ICD-10-CM | POA: Diagnosis not present

## 2021-02-04 DIAGNOSIS — E7849 Other hyperlipidemia: Secondary | ICD-10-CM | POA: Diagnosis not present

## 2021-02-04 DIAGNOSIS — E038 Other specified hypothyroidism: Secondary | ICD-10-CM | POA: Diagnosis not present

## 2021-02-04 DIAGNOSIS — Z79899 Other long term (current) drug therapy: Secondary | ICD-10-CM | POA: Diagnosis not present

## 2021-02-05 DIAGNOSIS — I5032 Chronic diastolic (congestive) heart failure: Secondary | ICD-10-CM | POA: Diagnosis not present

## 2021-02-05 DIAGNOSIS — Z4800 Encounter for change or removal of nonsurgical wound dressing: Secondary | ICD-10-CM | POA: Diagnosis not present

## 2021-02-05 DIAGNOSIS — I110 Hypertensive heart disease with heart failure: Secondary | ICD-10-CM | POA: Diagnosis not present

## 2021-02-05 DIAGNOSIS — M7989 Other specified soft tissue disorders: Secondary | ICD-10-CM | POA: Diagnosis not present

## 2021-02-05 DIAGNOSIS — M79645 Pain in left finger(s): Secondary | ICD-10-CM | POA: Diagnosis not present

## 2021-02-05 DIAGNOSIS — R600 Localized edema: Secondary | ICD-10-CM | POA: Diagnosis not present

## 2021-02-05 DIAGNOSIS — L97818 Non-pressure chronic ulcer of other part of right lower leg with other specified severity: Secondary | ICD-10-CM | POA: Diagnosis not present

## 2021-02-05 DIAGNOSIS — I872 Venous insufficiency (chronic) (peripheral): Secondary | ICD-10-CM | POA: Diagnosis not present

## 2021-02-05 DIAGNOSIS — L0390 Cellulitis, unspecified: Secondary | ICD-10-CM | POA: Diagnosis not present

## 2021-02-05 DIAGNOSIS — E119 Type 2 diabetes mellitus without complications: Secondary | ICD-10-CM | POA: Diagnosis not present

## 2021-02-05 DIAGNOSIS — E1151 Type 2 diabetes mellitus with diabetic peripheral angiopathy without gangrene: Secondary | ICD-10-CM | POA: Diagnosis not present

## 2021-02-07 DIAGNOSIS — I872 Venous insufficiency (chronic) (peripheral): Secondary | ICD-10-CM | POA: Diagnosis not present

## 2021-02-07 DIAGNOSIS — E559 Vitamin D deficiency, unspecified: Secondary | ICD-10-CM | POA: Diagnosis not present

## 2021-02-07 DIAGNOSIS — M1990 Unspecified osteoarthritis, unspecified site: Secondary | ICD-10-CM | POA: Diagnosis not present

## 2021-02-07 DIAGNOSIS — E538 Deficiency of other specified B group vitamins: Secondary | ICD-10-CM | POA: Diagnosis not present

## 2021-02-07 DIAGNOSIS — Z79899 Other long term (current) drug therapy: Secondary | ICD-10-CM | POA: Diagnosis not present

## 2021-02-07 DIAGNOSIS — Z4800 Encounter for change or removal of nonsurgical wound dressing: Secondary | ICD-10-CM | POA: Diagnosis not present

## 2021-02-07 DIAGNOSIS — L97818 Non-pressure chronic ulcer of other part of right lower leg with other specified severity: Secondary | ICD-10-CM | POA: Diagnosis not present

## 2021-02-07 DIAGNOSIS — E1151 Type 2 diabetes mellitus with diabetic peripheral angiopathy without gangrene: Secondary | ICD-10-CM | POA: Diagnosis not present

## 2021-02-07 DIAGNOSIS — E785 Hyperlipidemia, unspecified: Secondary | ICD-10-CM | POA: Diagnosis not present

## 2021-02-07 DIAGNOSIS — E119 Type 2 diabetes mellitus without complications: Secondary | ICD-10-CM | POA: Diagnosis not present

## 2021-02-11 DIAGNOSIS — I872 Venous insufficiency (chronic) (peripheral): Secondary | ICD-10-CM | POA: Diagnosis not present

## 2021-02-11 DIAGNOSIS — E1151 Type 2 diabetes mellitus with diabetic peripheral angiopathy without gangrene: Secondary | ICD-10-CM | POA: Diagnosis not present

## 2021-02-11 DIAGNOSIS — Z4800 Encounter for change or removal of nonsurgical wound dressing: Secondary | ICD-10-CM | POA: Diagnosis not present

## 2021-02-11 DIAGNOSIS — I5032 Chronic diastolic (congestive) heart failure: Secondary | ICD-10-CM | POA: Diagnosis not present

## 2021-02-11 DIAGNOSIS — I110 Hypertensive heart disease with heart failure: Secondary | ICD-10-CM | POA: Diagnosis not present

## 2021-02-11 DIAGNOSIS — L97818 Non-pressure chronic ulcer of other part of right lower leg with other specified severity: Secondary | ICD-10-CM | POA: Diagnosis not present

## 2021-02-12 DIAGNOSIS — M1990 Unspecified osteoarthritis, unspecified site: Secondary | ICD-10-CM | POA: Diagnosis not present

## 2021-02-12 DIAGNOSIS — R011 Cardiac murmur, unspecified: Secondary | ICD-10-CM | POA: Diagnosis not present

## 2021-02-12 DIAGNOSIS — I10 Essential (primary) hypertension: Secondary | ICD-10-CM | POA: Diagnosis not present

## 2021-02-12 DIAGNOSIS — E119 Type 2 diabetes mellitus without complications: Secondary | ICD-10-CM | POA: Diagnosis not present

## 2021-02-12 DIAGNOSIS — G4700 Insomnia, unspecified: Secondary | ICD-10-CM | POA: Diagnosis not present

## 2021-02-12 DIAGNOSIS — Z79899 Other long term (current) drug therapy: Secondary | ICD-10-CM | POA: Diagnosis not present

## 2021-02-12 DIAGNOSIS — E7849 Other hyperlipidemia: Secondary | ICD-10-CM | POA: Diagnosis not present

## 2021-02-12 DIAGNOSIS — E785 Hyperlipidemia, unspecified: Secondary | ICD-10-CM | POA: Diagnosis not present

## 2021-02-12 DIAGNOSIS — D518 Other vitamin B12 deficiency anemias: Secondary | ICD-10-CM | POA: Diagnosis not present

## 2021-02-12 DIAGNOSIS — I5032 Chronic diastolic (congestive) heart failure: Secondary | ICD-10-CM | POA: Diagnosis not present

## 2021-02-12 DIAGNOSIS — G8929 Other chronic pain: Secondary | ICD-10-CM | POA: Diagnosis not present

## 2021-02-12 DIAGNOSIS — E559 Vitamin D deficiency, unspecified: Secondary | ICD-10-CM | POA: Diagnosis not present

## 2021-02-14 DIAGNOSIS — L97818 Non-pressure chronic ulcer of other part of right lower leg with other specified severity: Secondary | ICD-10-CM | POA: Diagnosis not present

## 2021-02-14 DIAGNOSIS — I872 Venous insufficiency (chronic) (peripheral): Secondary | ICD-10-CM | POA: Diagnosis not present

## 2021-02-14 DIAGNOSIS — E1151 Type 2 diabetes mellitus with diabetic peripheral angiopathy without gangrene: Secondary | ICD-10-CM | POA: Diagnosis not present

## 2021-02-14 DIAGNOSIS — I110 Hypertensive heart disease with heart failure: Secondary | ICD-10-CM | POA: Diagnosis not present

## 2021-02-14 DIAGNOSIS — I5032 Chronic diastolic (congestive) heart failure: Secondary | ICD-10-CM | POA: Diagnosis not present

## 2021-02-14 DIAGNOSIS — Z4800 Encounter for change or removal of nonsurgical wound dressing: Secondary | ICD-10-CM | POA: Diagnosis not present

## 2021-02-18 DIAGNOSIS — Z4800 Encounter for change or removal of nonsurgical wound dressing: Secondary | ICD-10-CM | POA: Diagnosis not present

## 2021-02-18 DIAGNOSIS — I5032 Chronic diastolic (congestive) heart failure: Secondary | ICD-10-CM | POA: Diagnosis not present

## 2021-02-18 DIAGNOSIS — I110 Hypertensive heart disease with heart failure: Secondary | ICD-10-CM | POA: Diagnosis not present

## 2021-02-18 DIAGNOSIS — E1151 Type 2 diabetes mellitus with diabetic peripheral angiopathy without gangrene: Secondary | ICD-10-CM | POA: Diagnosis not present

## 2021-02-18 DIAGNOSIS — L97818 Non-pressure chronic ulcer of other part of right lower leg with other specified severity: Secondary | ICD-10-CM | POA: Diagnosis not present

## 2021-02-18 DIAGNOSIS — I872 Venous insufficiency (chronic) (peripheral): Secondary | ICD-10-CM | POA: Diagnosis not present

## 2021-02-21 DIAGNOSIS — E559 Vitamin D deficiency, unspecified: Secondary | ICD-10-CM | POA: Diagnosis not present

## 2021-02-21 DIAGNOSIS — I110 Hypertensive heart disease with heart failure: Secondary | ICD-10-CM | POA: Diagnosis not present

## 2021-02-21 DIAGNOSIS — M1990 Unspecified osteoarthritis, unspecified site: Secondary | ICD-10-CM | POA: Diagnosis not present

## 2021-02-21 DIAGNOSIS — I509 Heart failure, unspecified: Secondary | ICD-10-CM | POA: Diagnosis not present

## 2021-02-21 DIAGNOSIS — D518 Other vitamin B12 deficiency anemias: Secondary | ICD-10-CM | POA: Diagnosis not present

## 2021-02-21 DIAGNOSIS — I5032 Chronic diastolic (congestive) heart failure: Secondary | ICD-10-CM | POA: Diagnosis not present

## 2021-02-21 DIAGNOSIS — I10 Essential (primary) hypertension: Secondary | ICD-10-CM | POA: Diagnosis not present

## 2021-02-21 DIAGNOSIS — E038 Other specified hypothyroidism: Secondary | ICD-10-CM | POA: Diagnosis not present

## 2021-02-21 DIAGNOSIS — L97818 Non-pressure chronic ulcer of other part of right lower leg with other specified severity: Secondary | ICD-10-CM | POA: Diagnosis not present

## 2021-02-21 DIAGNOSIS — E785 Hyperlipidemia, unspecified: Secondary | ICD-10-CM | POA: Diagnosis not present

## 2021-02-21 DIAGNOSIS — Z4800 Encounter for change or removal of nonsurgical wound dressing: Secondary | ICD-10-CM | POA: Diagnosis not present

## 2021-02-21 DIAGNOSIS — E1151 Type 2 diabetes mellitus with diabetic peripheral angiopathy without gangrene: Secondary | ICD-10-CM | POA: Diagnosis not present

## 2021-02-21 DIAGNOSIS — I872 Venous insufficiency (chronic) (peripheral): Secondary | ICD-10-CM | POA: Diagnosis not present

## 2021-02-21 DIAGNOSIS — E119 Type 2 diabetes mellitus without complications: Secondary | ICD-10-CM | POA: Diagnosis not present

## 2021-02-26 DIAGNOSIS — I5032 Chronic diastolic (congestive) heart failure: Secondary | ICD-10-CM | POA: Diagnosis not present

## 2021-02-26 DIAGNOSIS — E1151 Type 2 diabetes mellitus with diabetic peripheral angiopathy without gangrene: Secondary | ICD-10-CM | POA: Diagnosis not present

## 2021-02-26 DIAGNOSIS — I872 Venous insufficiency (chronic) (peripheral): Secondary | ICD-10-CM | POA: Diagnosis not present

## 2021-02-26 DIAGNOSIS — L97818 Non-pressure chronic ulcer of other part of right lower leg with other specified severity: Secondary | ICD-10-CM | POA: Diagnosis not present

## 2021-02-26 DIAGNOSIS — I110 Hypertensive heart disease with heart failure: Secondary | ICD-10-CM | POA: Diagnosis not present

## 2021-02-26 DIAGNOSIS — Z4800 Encounter for change or removal of nonsurgical wound dressing: Secondary | ICD-10-CM | POA: Diagnosis not present

## 2021-03-01 DIAGNOSIS — I110 Hypertensive heart disease with heart failure: Secondary | ICD-10-CM | POA: Diagnosis not present

## 2021-03-01 DIAGNOSIS — L97818 Non-pressure chronic ulcer of other part of right lower leg with other specified severity: Secondary | ICD-10-CM | POA: Diagnosis not present

## 2021-03-01 DIAGNOSIS — I5020 Unspecified systolic (congestive) heart failure: Secondary | ICD-10-CM | POA: Diagnosis not present

## 2021-03-01 DIAGNOSIS — I5032 Chronic diastolic (congestive) heart failure: Secondary | ICD-10-CM | POA: Diagnosis not present

## 2021-03-01 DIAGNOSIS — I872 Venous insufficiency (chronic) (peripheral): Secondary | ICD-10-CM | POA: Diagnosis not present

## 2021-03-01 DIAGNOSIS — E1151 Type 2 diabetes mellitus with diabetic peripheral angiopathy without gangrene: Secondary | ICD-10-CM | POA: Diagnosis not present

## 2021-03-01 DIAGNOSIS — Z4800 Encounter for change or removal of nonsurgical wound dressing: Secondary | ICD-10-CM | POA: Diagnosis not present

## 2021-03-05 DIAGNOSIS — I5032 Chronic diastolic (congestive) heart failure: Secondary | ICD-10-CM | POA: Diagnosis not present

## 2021-03-05 DIAGNOSIS — Z4800 Encounter for change or removal of nonsurgical wound dressing: Secondary | ICD-10-CM | POA: Diagnosis not present

## 2021-03-05 DIAGNOSIS — L97818 Non-pressure chronic ulcer of other part of right lower leg with other specified severity: Secondary | ICD-10-CM | POA: Diagnosis not present

## 2021-03-05 DIAGNOSIS — I110 Hypertensive heart disease with heart failure: Secondary | ICD-10-CM | POA: Diagnosis not present

## 2021-03-05 DIAGNOSIS — I872 Venous insufficiency (chronic) (peripheral): Secondary | ICD-10-CM | POA: Diagnosis not present

## 2021-03-05 DIAGNOSIS — E1151 Type 2 diabetes mellitus with diabetic peripheral angiopathy without gangrene: Secondary | ICD-10-CM | POA: Diagnosis not present

## 2021-03-08 DIAGNOSIS — I5032 Chronic diastolic (congestive) heart failure: Secondary | ICD-10-CM | POA: Diagnosis not present

## 2021-03-08 DIAGNOSIS — Z4800 Encounter for change or removal of nonsurgical wound dressing: Secondary | ICD-10-CM | POA: Diagnosis not present

## 2021-03-08 DIAGNOSIS — I110 Hypertensive heart disease with heart failure: Secondary | ICD-10-CM | POA: Diagnosis not present

## 2021-03-08 DIAGNOSIS — I872 Venous insufficiency (chronic) (peripheral): Secondary | ICD-10-CM | POA: Diagnosis not present

## 2021-03-08 DIAGNOSIS — E1151 Type 2 diabetes mellitus with diabetic peripheral angiopathy without gangrene: Secondary | ICD-10-CM | POA: Diagnosis not present

## 2021-03-08 DIAGNOSIS — L97818 Non-pressure chronic ulcer of other part of right lower leg with other specified severity: Secondary | ICD-10-CM | POA: Diagnosis not present

## 2021-03-11 DIAGNOSIS — I110 Hypertensive heart disease with heart failure: Secondary | ICD-10-CM | POA: Diagnosis not present

## 2021-03-11 DIAGNOSIS — I872 Venous insufficiency (chronic) (peripheral): Secondary | ICD-10-CM | POA: Diagnosis not present

## 2021-03-11 DIAGNOSIS — I5032 Chronic diastolic (congestive) heart failure: Secondary | ICD-10-CM | POA: Diagnosis not present

## 2021-03-11 DIAGNOSIS — Z4800 Encounter for change or removal of nonsurgical wound dressing: Secondary | ICD-10-CM | POA: Diagnosis not present

## 2021-03-11 DIAGNOSIS — M1990 Unspecified osteoarthritis, unspecified site: Secondary | ICD-10-CM | POA: Diagnosis not present

## 2021-03-11 DIAGNOSIS — L97818 Non-pressure chronic ulcer of other part of right lower leg with other specified severity: Secondary | ICD-10-CM | POA: Diagnosis not present

## 2021-03-11 DIAGNOSIS — G8929 Other chronic pain: Secondary | ICD-10-CM | POA: Diagnosis not present

## 2021-03-11 DIAGNOSIS — E119 Type 2 diabetes mellitus without complications: Secondary | ICD-10-CM | POA: Diagnosis not present

## 2021-03-11 DIAGNOSIS — E1151 Type 2 diabetes mellitus with diabetic peripheral angiopathy without gangrene: Secondary | ICD-10-CM | POA: Diagnosis not present

## 2021-03-11 DIAGNOSIS — I10 Essential (primary) hypertension: Secondary | ICD-10-CM | POA: Diagnosis not present

## 2021-03-11 DIAGNOSIS — M109 Gout, unspecified: Secondary | ICD-10-CM | POA: Diagnosis not present

## 2021-03-11 DIAGNOSIS — E559 Vitamin D deficiency, unspecified: Secondary | ICD-10-CM | POA: Diagnosis not present

## 2021-03-13 DIAGNOSIS — I872 Venous insufficiency (chronic) (peripheral): Secondary | ICD-10-CM | POA: Diagnosis not present

## 2021-03-13 DIAGNOSIS — Z4800 Encounter for change or removal of nonsurgical wound dressing: Secondary | ICD-10-CM | POA: Diagnosis not present

## 2021-03-13 DIAGNOSIS — L97818 Non-pressure chronic ulcer of other part of right lower leg with other specified severity: Secondary | ICD-10-CM | POA: Diagnosis not present

## 2021-03-13 DIAGNOSIS — E1151 Type 2 diabetes mellitus with diabetic peripheral angiopathy without gangrene: Secondary | ICD-10-CM | POA: Diagnosis not present

## 2021-03-13 DIAGNOSIS — I5032 Chronic diastolic (congestive) heart failure: Secondary | ICD-10-CM | POA: Diagnosis not present

## 2021-03-13 DIAGNOSIS — I110 Hypertensive heart disease with heart failure: Secondary | ICD-10-CM | POA: Diagnosis not present

## 2021-03-14 DIAGNOSIS — Z4800 Encounter for change or removal of nonsurgical wound dressing: Secondary | ICD-10-CM | POA: Diagnosis not present

## 2021-03-14 DIAGNOSIS — I872 Venous insufficiency (chronic) (peripheral): Secondary | ICD-10-CM | POA: Diagnosis not present

## 2021-03-14 DIAGNOSIS — E1151 Type 2 diabetes mellitus with diabetic peripheral angiopathy without gangrene: Secondary | ICD-10-CM | POA: Diagnosis not present

## 2021-03-14 DIAGNOSIS — L97818 Non-pressure chronic ulcer of other part of right lower leg with other specified severity: Secondary | ICD-10-CM | POA: Diagnosis not present

## 2021-03-14 DIAGNOSIS — I110 Hypertensive heart disease with heart failure: Secondary | ICD-10-CM | POA: Diagnosis not present

## 2021-03-14 DIAGNOSIS — I5020 Unspecified systolic (congestive) heart failure: Secondary | ICD-10-CM | POA: Diagnosis not present

## 2021-03-14 DIAGNOSIS — I5032 Chronic diastolic (congestive) heart failure: Secondary | ICD-10-CM | POA: Diagnosis not present

## 2021-03-19 DIAGNOSIS — E1151 Type 2 diabetes mellitus with diabetic peripheral angiopathy without gangrene: Secondary | ICD-10-CM | POA: Diagnosis not present

## 2021-03-19 DIAGNOSIS — I872 Venous insufficiency (chronic) (peripheral): Secondary | ICD-10-CM | POA: Diagnosis not present

## 2021-03-19 DIAGNOSIS — Z4800 Encounter for change or removal of nonsurgical wound dressing: Secondary | ICD-10-CM | POA: Diagnosis not present

## 2021-03-19 DIAGNOSIS — I5032 Chronic diastolic (congestive) heart failure: Secondary | ICD-10-CM | POA: Diagnosis not present

## 2021-03-19 DIAGNOSIS — L97818 Non-pressure chronic ulcer of other part of right lower leg with other specified severity: Secondary | ICD-10-CM | POA: Diagnosis not present

## 2021-03-19 DIAGNOSIS — I110 Hypertensive heart disease with heart failure: Secondary | ICD-10-CM | POA: Diagnosis not present

## 2021-03-21 DIAGNOSIS — I872 Venous insufficiency (chronic) (peripheral): Secondary | ICD-10-CM | POA: Diagnosis not present

## 2021-03-21 DIAGNOSIS — I110 Hypertensive heart disease with heart failure: Secondary | ICD-10-CM | POA: Diagnosis not present

## 2021-03-21 DIAGNOSIS — L97818 Non-pressure chronic ulcer of other part of right lower leg with other specified severity: Secondary | ICD-10-CM | POA: Diagnosis not present

## 2021-03-21 DIAGNOSIS — Z4800 Encounter for change or removal of nonsurgical wound dressing: Secondary | ICD-10-CM | POA: Diagnosis not present

## 2021-03-21 DIAGNOSIS — I5032 Chronic diastolic (congestive) heart failure: Secondary | ICD-10-CM | POA: Diagnosis not present

## 2021-03-21 DIAGNOSIS — E1151 Type 2 diabetes mellitus with diabetic peripheral angiopathy without gangrene: Secondary | ICD-10-CM | POA: Diagnosis not present

## 2021-03-25 DIAGNOSIS — Z4800 Encounter for change or removal of nonsurgical wound dressing: Secondary | ICD-10-CM | POA: Diagnosis not present

## 2021-03-25 DIAGNOSIS — I5032 Chronic diastolic (congestive) heart failure: Secondary | ICD-10-CM | POA: Diagnosis not present

## 2021-03-25 DIAGNOSIS — I872 Venous insufficiency (chronic) (peripheral): Secondary | ICD-10-CM | POA: Diagnosis not present

## 2021-03-25 DIAGNOSIS — E1151 Type 2 diabetes mellitus with diabetic peripheral angiopathy without gangrene: Secondary | ICD-10-CM | POA: Diagnosis not present

## 2021-03-25 DIAGNOSIS — I110 Hypertensive heart disease with heart failure: Secondary | ICD-10-CM | POA: Diagnosis not present

## 2021-03-25 DIAGNOSIS — L97818 Non-pressure chronic ulcer of other part of right lower leg with other specified severity: Secondary | ICD-10-CM | POA: Diagnosis not present

## 2021-03-28 DIAGNOSIS — L97818 Non-pressure chronic ulcer of other part of right lower leg with other specified severity: Secondary | ICD-10-CM | POA: Diagnosis not present

## 2021-03-28 DIAGNOSIS — I872 Venous insufficiency (chronic) (peripheral): Secondary | ICD-10-CM | POA: Diagnosis not present

## 2021-03-28 DIAGNOSIS — I110 Hypertensive heart disease with heart failure: Secondary | ICD-10-CM | POA: Diagnosis not present

## 2021-03-28 DIAGNOSIS — E1151 Type 2 diabetes mellitus with diabetic peripheral angiopathy without gangrene: Secondary | ICD-10-CM | POA: Diagnosis not present

## 2021-03-28 DIAGNOSIS — Z4800 Encounter for change or removal of nonsurgical wound dressing: Secondary | ICD-10-CM | POA: Diagnosis not present

## 2021-03-28 DIAGNOSIS — I5032 Chronic diastolic (congestive) heart failure: Secondary | ICD-10-CM | POA: Diagnosis not present

## 2021-04-01 DIAGNOSIS — I5032 Chronic diastolic (congestive) heart failure: Secondary | ICD-10-CM | POA: Diagnosis not present

## 2021-04-01 DIAGNOSIS — E782 Mixed hyperlipidemia: Secondary | ICD-10-CM | POA: Diagnosis not present

## 2021-04-01 DIAGNOSIS — E038 Other specified hypothyroidism: Secondary | ICD-10-CM | POA: Diagnosis not present

## 2021-04-01 DIAGNOSIS — I509 Heart failure, unspecified: Secondary | ICD-10-CM | POA: Diagnosis not present

## 2021-04-01 DIAGNOSIS — D518 Other vitamin B12 deficiency anemias: Secondary | ICD-10-CM | POA: Diagnosis not present

## 2021-04-01 DIAGNOSIS — I110 Hypertensive heart disease with heart failure: Secondary | ICD-10-CM | POA: Diagnosis not present

## 2021-04-01 DIAGNOSIS — E1151 Type 2 diabetes mellitus with diabetic peripheral angiopathy without gangrene: Secondary | ICD-10-CM | POA: Diagnosis not present

## 2021-04-01 DIAGNOSIS — L97818 Non-pressure chronic ulcer of other part of right lower leg with other specified severity: Secondary | ICD-10-CM | POA: Diagnosis not present

## 2021-04-01 DIAGNOSIS — I10 Essential (primary) hypertension: Secondary | ICD-10-CM | POA: Diagnosis not present

## 2021-04-01 DIAGNOSIS — I872 Venous insufficiency (chronic) (peripheral): Secondary | ICD-10-CM | POA: Diagnosis not present

## 2021-04-01 DIAGNOSIS — Z4800 Encounter for change or removal of nonsurgical wound dressing: Secondary | ICD-10-CM | POA: Diagnosis not present

## 2021-04-01 DIAGNOSIS — E785 Hyperlipidemia, unspecified: Secondary | ICD-10-CM | POA: Diagnosis not present

## 2021-04-01 DIAGNOSIS — E119 Type 2 diabetes mellitus without complications: Secondary | ICD-10-CM | POA: Diagnosis not present

## 2021-04-01 DIAGNOSIS — E559 Vitamin D deficiency, unspecified: Secondary | ICD-10-CM | POA: Diagnosis not present

## 2021-04-01 DIAGNOSIS — M1990 Unspecified osteoarthritis, unspecified site: Secondary | ICD-10-CM | POA: Diagnosis not present

## 2021-04-04 DIAGNOSIS — E1151 Type 2 diabetes mellitus with diabetic peripheral angiopathy without gangrene: Secondary | ICD-10-CM | POA: Diagnosis not present

## 2021-04-04 DIAGNOSIS — L97818 Non-pressure chronic ulcer of other part of right lower leg with other specified severity: Secondary | ICD-10-CM | POA: Diagnosis not present

## 2021-04-04 DIAGNOSIS — I110 Hypertensive heart disease with heart failure: Secondary | ICD-10-CM | POA: Diagnosis not present

## 2021-04-04 DIAGNOSIS — I5032 Chronic diastolic (congestive) heart failure: Secondary | ICD-10-CM | POA: Diagnosis not present

## 2021-04-04 DIAGNOSIS — Z4800 Encounter for change or removal of nonsurgical wound dressing: Secondary | ICD-10-CM | POA: Diagnosis not present

## 2021-04-04 DIAGNOSIS — I872 Venous insufficiency (chronic) (peripheral): Secondary | ICD-10-CM | POA: Diagnosis not present

## 2021-04-09 DIAGNOSIS — E1151 Type 2 diabetes mellitus with diabetic peripheral angiopathy without gangrene: Secondary | ICD-10-CM | POA: Diagnosis not present

## 2021-04-09 DIAGNOSIS — I10 Essential (primary) hypertension: Secondary | ICD-10-CM | POA: Diagnosis not present

## 2021-04-09 DIAGNOSIS — I5032 Chronic diastolic (congestive) heart failure: Secondary | ICD-10-CM | POA: Diagnosis not present

## 2021-04-09 DIAGNOSIS — E559 Vitamin D deficiency, unspecified: Secondary | ICD-10-CM | POA: Diagnosis not present

## 2021-04-09 DIAGNOSIS — L97818 Non-pressure chronic ulcer of other part of right lower leg with other specified severity: Secondary | ICD-10-CM | POA: Diagnosis not present

## 2021-04-09 DIAGNOSIS — G4700 Insomnia, unspecified: Secondary | ICD-10-CM | POA: Diagnosis not present

## 2021-04-09 DIAGNOSIS — Z4800 Encounter for change or removal of nonsurgical wound dressing: Secondary | ICD-10-CM | POA: Diagnosis not present

## 2021-04-09 DIAGNOSIS — I110 Hypertensive heart disease with heart failure: Secondary | ICD-10-CM | POA: Diagnosis not present

## 2021-04-09 DIAGNOSIS — E782 Mixed hyperlipidemia: Secondary | ICD-10-CM | POA: Diagnosis not present

## 2021-04-09 DIAGNOSIS — I872 Venous insufficiency (chronic) (peripheral): Secondary | ICD-10-CM | POA: Diagnosis not present

## 2021-04-09 DIAGNOSIS — E119 Type 2 diabetes mellitus without complications: Secondary | ICD-10-CM | POA: Diagnosis not present

## 2021-04-09 DIAGNOSIS — N1831 Chronic kidney disease, stage 3a: Secondary | ICD-10-CM | POA: Diagnosis not present

## 2021-04-11 DIAGNOSIS — E1151 Type 2 diabetes mellitus with diabetic peripheral angiopathy without gangrene: Secondary | ICD-10-CM | POA: Diagnosis not present

## 2021-04-11 DIAGNOSIS — I872 Venous insufficiency (chronic) (peripheral): Secondary | ICD-10-CM | POA: Diagnosis not present

## 2021-04-11 DIAGNOSIS — L97818 Non-pressure chronic ulcer of other part of right lower leg with other specified severity: Secondary | ICD-10-CM | POA: Diagnosis not present

## 2021-04-11 DIAGNOSIS — Z4800 Encounter for change or removal of nonsurgical wound dressing: Secondary | ICD-10-CM | POA: Diagnosis not present

## 2021-04-11 DIAGNOSIS — I5032 Chronic diastolic (congestive) heart failure: Secondary | ICD-10-CM | POA: Diagnosis not present

## 2021-04-11 DIAGNOSIS — I110 Hypertensive heart disease with heart failure: Secondary | ICD-10-CM | POA: Diagnosis not present

## 2021-04-16 DIAGNOSIS — I110 Hypertensive heart disease with heart failure: Secondary | ICD-10-CM | POA: Diagnosis not present

## 2021-04-16 DIAGNOSIS — L97818 Non-pressure chronic ulcer of other part of right lower leg with other specified severity: Secondary | ICD-10-CM | POA: Diagnosis not present

## 2021-04-16 DIAGNOSIS — I872 Venous insufficiency (chronic) (peripheral): Secondary | ICD-10-CM | POA: Diagnosis not present

## 2021-04-16 DIAGNOSIS — I5032 Chronic diastolic (congestive) heart failure: Secondary | ICD-10-CM | POA: Diagnosis not present

## 2021-04-16 DIAGNOSIS — Z4800 Encounter for change or removal of nonsurgical wound dressing: Secondary | ICD-10-CM | POA: Diagnosis not present

## 2021-04-16 DIAGNOSIS — E1151 Type 2 diabetes mellitus with diabetic peripheral angiopathy without gangrene: Secondary | ICD-10-CM | POA: Diagnosis not present

## 2021-04-19 DIAGNOSIS — I5032 Chronic diastolic (congestive) heart failure: Secondary | ICD-10-CM | POA: Diagnosis not present

## 2021-04-19 DIAGNOSIS — I872 Venous insufficiency (chronic) (peripheral): Secondary | ICD-10-CM | POA: Diagnosis not present

## 2021-04-19 DIAGNOSIS — E1151 Type 2 diabetes mellitus with diabetic peripheral angiopathy without gangrene: Secondary | ICD-10-CM | POA: Diagnosis not present

## 2021-04-19 DIAGNOSIS — L97818 Non-pressure chronic ulcer of other part of right lower leg with other specified severity: Secondary | ICD-10-CM | POA: Diagnosis not present

## 2021-04-19 DIAGNOSIS — I110 Hypertensive heart disease with heart failure: Secondary | ICD-10-CM | POA: Diagnosis not present

## 2021-04-19 DIAGNOSIS — Z4800 Encounter for change or removal of nonsurgical wound dressing: Secondary | ICD-10-CM | POA: Diagnosis not present

## 2021-04-22 DIAGNOSIS — B351 Tinea unguium: Secondary | ICD-10-CM | POA: Diagnosis not present

## 2021-04-22 DIAGNOSIS — L97818 Non-pressure chronic ulcer of other part of right lower leg with other specified severity: Secondary | ICD-10-CM | POA: Diagnosis not present

## 2021-04-22 DIAGNOSIS — E1159 Type 2 diabetes mellitus with other circulatory complications: Secondary | ICD-10-CM | POA: Diagnosis not present

## 2021-04-22 DIAGNOSIS — R600 Localized edema: Secondary | ICD-10-CM | POA: Diagnosis not present

## 2021-04-22 DIAGNOSIS — E1151 Type 2 diabetes mellitus with diabetic peripheral angiopathy without gangrene: Secondary | ICD-10-CM | POA: Diagnosis not present

## 2021-04-22 DIAGNOSIS — I5032 Chronic diastolic (congestive) heart failure: Secondary | ICD-10-CM | POA: Diagnosis not present

## 2021-04-22 DIAGNOSIS — I872 Venous insufficiency (chronic) (peripheral): Secondary | ICD-10-CM | POA: Diagnosis not present

## 2021-04-22 DIAGNOSIS — I110 Hypertensive heart disease with heart failure: Secondary | ICD-10-CM | POA: Diagnosis not present

## 2021-04-22 DIAGNOSIS — L603 Nail dystrophy: Secondary | ICD-10-CM | POA: Diagnosis not present

## 2021-04-22 DIAGNOSIS — Z4800 Encounter for change or removal of nonsurgical wound dressing: Secondary | ICD-10-CM | POA: Diagnosis not present

## 2021-04-23 DIAGNOSIS — E559 Vitamin D deficiency, unspecified: Secondary | ICD-10-CM | POA: Diagnosis not present

## 2021-04-23 DIAGNOSIS — E1151 Type 2 diabetes mellitus with diabetic peripheral angiopathy without gangrene: Secondary | ICD-10-CM | POA: Diagnosis not present

## 2021-04-23 DIAGNOSIS — Z79899 Other long term (current) drug therapy: Secondary | ICD-10-CM | POA: Diagnosis not present

## 2021-04-23 DIAGNOSIS — E7849 Other hyperlipidemia: Secondary | ICD-10-CM | POA: Diagnosis not present

## 2021-04-23 DIAGNOSIS — E119 Type 2 diabetes mellitus without complications: Secondary | ICD-10-CM | POA: Diagnosis not present

## 2021-04-23 DIAGNOSIS — D518 Other vitamin B12 deficiency anemias: Secondary | ICD-10-CM | POA: Diagnosis not present

## 2021-04-24 DIAGNOSIS — E782 Mixed hyperlipidemia: Secondary | ICD-10-CM | POA: Diagnosis not present

## 2021-04-24 DIAGNOSIS — M1990 Unspecified osteoarthritis, unspecified site: Secondary | ICD-10-CM | POA: Diagnosis not present

## 2021-04-24 DIAGNOSIS — D518 Other vitamin B12 deficiency anemias: Secondary | ICD-10-CM | POA: Diagnosis not present

## 2021-04-24 DIAGNOSIS — E785 Hyperlipidemia, unspecified: Secondary | ICD-10-CM | POA: Diagnosis not present

## 2021-04-24 DIAGNOSIS — E119 Type 2 diabetes mellitus without complications: Secondary | ICD-10-CM | POA: Diagnosis not present

## 2021-04-24 DIAGNOSIS — E038 Other specified hypothyroidism: Secondary | ICD-10-CM | POA: Diagnosis not present

## 2021-04-24 DIAGNOSIS — E559 Vitamin D deficiency, unspecified: Secondary | ICD-10-CM | POA: Diagnosis not present

## 2021-04-24 DIAGNOSIS — E1151 Type 2 diabetes mellitus with diabetic peripheral angiopathy without gangrene: Secondary | ICD-10-CM | POA: Diagnosis not present

## 2021-04-24 DIAGNOSIS — I10 Essential (primary) hypertension: Secondary | ICD-10-CM | POA: Diagnosis not present

## 2021-04-24 DIAGNOSIS — I509 Heart failure, unspecified: Secondary | ICD-10-CM | POA: Diagnosis not present

## 2021-04-24 DIAGNOSIS — I5032 Chronic diastolic (congestive) heart failure: Secondary | ICD-10-CM | POA: Diagnosis not present

## 2021-04-25 DIAGNOSIS — L97818 Non-pressure chronic ulcer of other part of right lower leg with other specified severity: Secondary | ICD-10-CM | POA: Diagnosis not present

## 2021-04-25 DIAGNOSIS — I5032 Chronic diastolic (congestive) heart failure: Secondary | ICD-10-CM | POA: Diagnosis not present

## 2021-04-25 DIAGNOSIS — Z4800 Encounter for change or removal of nonsurgical wound dressing: Secondary | ICD-10-CM | POA: Diagnosis not present

## 2021-04-25 DIAGNOSIS — I872 Venous insufficiency (chronic) (peripheral): Secondary | ICD-10-CM | POA: Diagnosis not present

## 2021-04-25 DIAGNOSIS — E1151 Type 2 diabetes mellitus with diabetic peripheral angiopathy without gangrene: Secondary | ICD-10-CM | POA: Diagnosis not present

## 2021-04-25 DIAGNOSIS — I110 Hypertensive heart disease with heart failure: Secondary | ICD-10-CM | POA: Diagnosis not present

## 2021-04-30 DIAGNOSIS — I5032 Chronic diastolic (congestive) heart failure: Secondary | ICD-10-CM | POA: Diagnosis not present

## 2021-04-30 DIAGNOSIS — I110 Hypertensive heart disease with heart failure: Secondary | ICD-10-CM | POA: Diagnosis not present

## 2021-04-30 DIAGNOSIS — I872 Venous insufficiency (chronic) (peripheral): Secondary | ICD-10-CM | POA: Diagnosis not present

## 2021-04-30 DIAGNOSIS — E1151 Type 2 diabetes mellitus with diabetic peripheral angiopathy without gangrene: Secondary | ICD-10-CM | POA: Diagnosis not present

## 2021-04-30 DIAGNOSIS — Z4800 Encounter for change or removal of nonsurgical wound dressing: Secondary | ICD-10-CM | POA: Diagnosis not present

## 2021-04-30 DIAGNOSIS — L97818 Non-pressure chronic ulcer of other part of right lower leg with other specified severity: Secondary | ICD-10-CM | POA: Diagnosis not present

## 2021-05-03 DIAGNOSIS — I110 Hypertensive heart disease with heart failure: Secondary | ICD-10-CM | POA: Diagnosis not present

## 2021-05-03 DIAGNOSIS — E1151 Type 2 diabetes mellitus with diabetic peripheral angiopathy without gangrene: Secondary | ICD-10-CM | POA: Diagnosis not present

## 2021-05-03 DIAGNOSIS — I5032 Chronic diastolic (congestive) heart failure: Secondary | ICD-10-CM | POA: Diagnosis not present

## 2021-05-03 DIAGNOSIS — Z4800 Encounter for change or removal of nonsurgical wound dressing: Secondary | ICD-10-CM | POA: Diagnosis not present

## 2021-05-03 DIAGNOSIS — I872 Venous insufficiency (chronic) (peripheral): Secondary | ICD-10-CM | POA: Diagnosis not present

## 2021-05-03 DIAGNOSIS — L97818 Non-pressure chronic ulcer of other part of right lower leg with other specified severity: Secondary | ICD-10-CM | POA: Diagnosis not present

## 2021-05-07 DIAGNOSIS — I110 Hypertensive heart disease with heart failure: Secondary | ICD-10-CM | POA: Diagnosis not present

## 2021-05-07 DIAGNOSIS — L97818 Non-pressure chronic ulcer of other part of right lower leg with other specified severity: Secondary | ICD-10-CM | POA: Diagnosis not present

## 2021-05-07 DIAGNOSIS — E1151 Type 2 diabetes mellitus with diabetic peripheral angiopathy without gangrene: Secondary | ICD-10-CM | POA: Diagnosis not present

## 2021-05-07 DIAGNOSIS — I872 Venous insufficiency (chronic) (peripheral): Secondary | ICD-10-CM | POA: Diagnosis not present

## 2021-05-07 DIAGNOSIS — S81801D Unspecified open wound, right lower leg, subsequent encounter: Secondary | ICD-10-CM | POA: Diagnosis not present

## 2021-05-07 DIAGNOSIS — I5032 Chronic diastolic (congestive) heart failure: Secondary | ICD-10-CM | POA: Diagnosis not present

## 2021-05-07 DIAGNOSIS — Z4800 Encounter for change or removal of nonsurgical wound dressing: Secondary | ICD-10-CM | POA: Diagnosis not present

## 2021-05-08 DIAGNOSIS — I872 Venous insufficiency (chronic) (peripheral): Secondary | ICD-10-CM | POA: Diagnosis not present

## 2021-05-08 DIAGNOSIS — I5032 Chronic diastolic (congestive) heart failure: Secondary | ICD-10-CM | POA: Diagnosis not present

## 2021-05-08 DIAGNOSIS — I110 Hypertensive heart disease with heart failure: Secondary | ICD-10-CM | POA: Diagnosis not present

## 2021-05-08 DIAGNOSIS — E1151 Type 2 diabetes mellitus with diabetic peripheral angiopathy without gangrene: Secondary | ICD-10-CM | POA: Diagnosis not present

## 2021-05-08 DIAGNOSIS — L97818 Non-pressure chronic ulcer of other part of right lower leg with other specified severity: Secondary | ICD-10-CM | POA: Diagnosis not present

## 2021-05-08 DIAGNOSIS — Z4800 Encounter for change or removal of nonsurgical wound dressing: Secondary | ICD-10-CM | POA: Diagnosis not present

## 2021-05-08 DIAGNOSIS — I5020 Unspecified systolic (congestive) heart failure: Secondary | ICD-10-CM | POA: Diagnosis not present

## 2021-05-09 DIAGNOSIS — I5032 Chronic diastolic (congestive) heart failure: Secondary | ICD-10-CM | POA: Diagnosis not present

## 2021-05-09 DIAGNOSIS — I110 Hypertensive heart disease with heart failure: Secondary | ICD-10-CM | POA: Diagnosis not present

## 2021-05-09 DIAGNOSIS — L97818 Non-pressure chronic ulcer of other part of right lower leg with other specified severity: Secondary | ICD-10-CM | POA: Diagnosis not present

## 2021-05-09 DIAGNOSIS — I872 Venous insufficiency (chronic) (peripheral): Secondary | ICD-10-CM | POA: Diagnosis not present

## 2021-05-09 DIAGNOSIS — Z4800 Encounter for change or removal of nonsurgical wound dressing: Secondary | ICD-10-CM | POA: Diagnosis not present

## 2021-05-09 DIAGNOSIS — E1151 Type 2 diabetes mellitus with diabetic peripheral angiopathy without gangrene: Secondary | ICD-10-CM | POA: Diagnosis not present

## 2021-05-14 DIAGNOSIS — E1151 Type 2 diabetes mellitus with diabetic peripheral angiopathy without gangrene: Secondary | ICD-10-CM | POA: Diagnosis not present

## 2021-05-14 DIAGNOSIS — I872 Venous insufficiency (chronic) (peripheral): Secondary | ICD-10-CM | POA: Diagnosis not present

## 2021-05-14 DIAGNOSIS — I5032 Chronic diastolic (congestive) heart failure: Secondary | ICD-10-CM | POA: Diagnosis not present

## 2021-05-14 DIAGNOSIS — Z4800 Encounter for change or removal of nonsurgical wound dressing: Secondary | ICD-10-CM | POA: Diagnosis not present

## 2021-05-14 DIAGNOSIS — I110 Hypertensive heart disease with heart failure: Secondary | ICD-10-CM | POA: Diagnosis not present

## 2021-05-14 DIAGNOSIS — L97818 Non-pressure chronic ulcer of other part of right lower leg with other specified severity: Secondary | ICD-10-CM | POA: Diagnosis not present

## 2021-05-16 DIAGNOSIS — I5032 Chronic diastolic (congestive) heart failure: Secondary | ICD-10-CM | POA: Diagnosis not present

## 2021-05-16 DIAGNOSIS — Z4800 Encounter for change or removal of nonsurgical wound dressing: Secondary | ICD-10-CM | POA: Diagnosis not present

## 2021-05-16 DIAGNOSIS — E1151 Type 2 diabetes mellitus with diabetic peripheral angiopathy without gangrene: Secondary | ICD-10-CM | POA: Diagnosis not present

## 2021-05-16 DIAGNOSIS — L97818 Non-pressure chronic ulcer of other part of right lower leg with other specified severity: Secondary | ICD-10-CM | POA: Diagnosis not present

## 2021-05-16 DIAGNOSIS — I872 Venous insufficiency (chronic) (peripheral): Secondary | ICD-10-CM | POA: Diagnosis not present

## 2021-05-16 DIAGNOSIS — I110 Hypertensive heart disease with heart failure: Secondary | ICD-10-CM | POA: Diagnosis not present

## 2021-05-19 DIAGNOSIS — I110 Hypertensive heart disease with heart failure: Secondary | ICD-10-CM | POA: Diagnosis not present

## 2021-05-19 DIAGNOSIS — I5032 Chronic diastolic (congestive) heart failure: Secondary | ICD-10-CM | POA: Diagnosis not present

## 2021-05-19 DIAGNOSIS — E1151 Type 2 diabetes mellitus with diabetic peripheral angiopathy without gangrene: Secondary | ICD-10-CM | POA: Diagnosis not present

## 2021-05-19 DIAGNOSIS — Z4800 Encounter for change or removal of nonsurgical wound dressing: Secondary | ICD-10-CM | POA: Diagnosis not present

## 2021-05-19 DIAGNOSIS — I872 Venous insufficiency (chronic) (peripheral): Secondary | ICD-10-CM | POA: Diagnosis not present

## 2021-05-19 DIAGNOSIS — L97818 Non-pressure chronic ulcer of other part of right lower leg with other specified severity: Secondary | ICD-10-CM | POA: Diagnosis not present

## 2021-05-22 DIAGNOSIS — I110 Hypertensive heart disease with heart failure: Secondary | ICD-10-CM | POA: Diagnosis not present

## 2021-05-22 DIAGNOSIS — E1151 Type 2 diabetes mellitus with diabetic peripheral angiopathy without gangrene: Secondary | ICD-10-CM | POA: Diagnosis not present

## 2021-05-22 DIAGNOSIS — Z4800 Encounter for change or removal of nonsurgical wound dressing: Secondary | ICD-10-CM | POA: Diagnosis not present

## 2021-05-22 DIAGNOSIS — I5032 Chronic diastolic (congestive) heart failure: Secondary | ICD-10-CM | POA: Diagnosis not present

## 2021-05-22 DIAGNOSIS — I872 Venous insufficiency (chronic) (peripheral): Secondary | ICD-10-CM | POA: Diagnosis not present

## 2021-05-22 DIAGNOSIS — L97818 Non-pressure chronic ulcer of other part of right lower leg with other specified severity: Secondary | ICD-10-CM | POA: Diagnosis not present

## 2021-05-23 DIAGNOSIS — I5032 Chronic diastolic (congestive) heart failure: Secondary | ICD-10-CM | POA: Diagnosis not present

## 2021-05-23 DIAGNOSIS — E119 Type 2 diabetes mellitus without complications: Secondary | ICD-10-CM | POA: Diagnosis not present

## 2021-05-23 DIAGNOSIS — E559 Vitamin D deficiency, unspecified: Secondary | ICD-10-CM | POA: Diagnosis not present

## 2021-05-23 DIAGNOSIS — E038 Other specified hypothyroidism: Secondary | ICD-10-CM | POA: Diagnosis not present

## 2021-05-23 DIAGNOSIS — I10 Essential (primary) hypertension: Secondary | ICD-10-CM | POA: Diagnosis not present

## 2021-05-23 DIAGNOSIS — D518 Other vitamin B12 deficiency anemias: Secondary | ICD-10-CM | POA: Diagnosis not present

## 2021-05-23 DIAGNOSIS — I509 Heart failure, unspecified: Secondary | ICD-10-CM | POA: Diagnosis not present

## 2021-05-23 DIAGNOSIS — M1990 Unspecified osteoarthritis, unspecified site: Secondary | ICD-10-CM | POA: Diagnosis not present

## 2021-05-23 DIAGNOSIS — E782 Mixed hyperlipidemia: Secondary | ICD-10-CM | POA: Diagnosis not present

## 2021-05-23 DIAGNOSIS — E785 Hyperlipidemia, unspecified: Secondary | ICD-10-CM | POA: Diagnosis not present

## 2021-05-23 DIAGNOSIS — E1151 Type 2 diabetes mellitus with diabetic peripheral angiopathy without gangrene: Secondary | ICD-10-CM | POA: Diagnosis not present

## 2021-05-28 DIAGNOSIS — L97818 Non-pressure chronic ulcer of other part of right lower leg with other specified severity: Secondary | ICD-10-CM | POA: Diagnosis not present

## 2021-05-28 DIAGNOSIS — Z4800 Encounter for change or removal of nonsurgical wound dressing: Secondary | ICD-10-CM | POA: Diagnosis not present

## 2021-05-28 DIAGNOSIS — I872 Venous insufficiency (chronic) (peripheral): Secondary | ICD-10-CM | POA: Diagnosis not present

## 2021-05-28 DIAGNOSIS — I5032 Chronic diastolic (congestive) heart failure: Secondary | ICD-10-CM | POA: Diagnosis not present

## 2021-05-28 DIAGNOSIS — E1151 Type 2 diabetes mellitus with diabetic peripheral angiopathy without gangrene: Secondary | ICD-10-CM | POA: Diagnosis not present

## 2021-05-28 DIAGNOSIS — I110 Hypertensive heart disease with heart failure: Secondary | ICD-10-CM | POA: Diagnosis not present

## 2021-05-31 DIAGNOSIS — I5032 Chronic diastolic (congestive) heart failure: Secondary | ICD-10-CM | POA: Diagnosis not present

## 2021-05-31 DIAGNOSIS — I110 Hypertensive heart disease with heart failure: Secondary | ICD-10-CM | POA: Diagnosis not present

## 2021-05-31 DIAGNOSIS — E1151 Type 2 diabetes mellitus with diabetic peripheral angiopathy without gangrene: Secondary | ICD-10-CM | POA: Diagnosis not present

## 2021-05-31 DIAGNOSIS — I872 Venous insufficiency (chronic) (peripheral): Secondary | ICD-10-CM | POA: Diagnosis not present

## 2021-05-31 DIAGNOSIS — Z4800 Encounter for change or removal of nonsurgical wound dressing: Secondary | ICD-10-CM | POA: Diagnosis not present

## 2021-05-31 DIAGNOSIS — L97818 Non-pressure chronic ulcer of other part of right lower leg with other specified severity: Secondary | ICD-10-CM | POA: Diagnosis not present

## 2021-06-03 DIAGNOSIS — E1151 Type 2 diabetes mellitus with diabetic peripheral angiopathy without gangrene: Secondary | ICD-10-CM | POA: Diagnosis not present

## 2021-06-03 DIAGNOSIS — I110 Hypertensive heart disease with heart failure: Secondary | ICD-10-CM | POA: Diagnosis not present

## 2021-06-03 DIAGNOSIS — I5032 Chronic diastolic (congestive) heart failure: Secondary | ICD-10-CM | POA: Diagnosis not present

## 2021-06-03 DIAGNOSIS — L97818 Non-pressure chronic ulcer of other part of right lower leg with other specified severity: Secondary | ICD-10-CM | POA: Diagnosis not present

## 2021-06-03 DIAGNOSIS — Z4800 Encounter for change or removal of nonsurgical wound dressing: Secondary | ICD-10-CM | POA: Diagnosis not present

## 2021-06-03 DIAGNOSIS — I872 Venous insufficiency (chronic) (peripheral): Secondary | ICD-10-CM | POA: Diagnosis not present

## 2021-06-05 DIAGNOSIS — L97818 Non-pressure chronic ulcer of other part of right lower leg with other specified severity: Secondary | ICD-10-CM | POA: Diagnosis not present

## 2021-06-05 DIAGNOSIS — I872 Venous insufficiency (chronic) (peripheral): Secondary | ICD-10-CM | POA: Diagnosis not present

## 2021-06-05 DIAGNOSIS — I5032 Chronic diastolic (congestive) heart failure: Secondary | ICD-10-CM | POA: Diagnosis not present

## 2021-06-05 DIAGNOSIS — E1151 Type 2 diabetes mellitus with diabetic peripheral angiopathy without gangrene: Secondary | ICD-10-CM | POA: Diagnosis not present

## 2021-06-05 DIAGNOSIS — I110 Hypertensive heart disease with heart failure: Secondary | ICD-10-CM | POA: Diagnosis not present

## 2021-06-05 DIAGNOSIS — Z4800 Encounter for change or removal of nonsurgical wound dressing: Secondary | ICD-10-CM | POA: Diagnosis not present

## 2021-06-10 DIAGNOSIS — I5032 Chronic diastolic (congestive) heart failure: Secondary | ICD-10-CM | POA: Diagnosis not present

## 2021-06-10 DIAGNOSIS — L97818 Non-pressure chronic ulcer of other part of right lower leg with other specified severity: Secondary | ICD-10-CM | POA: Diagnosis not present

## 2021-06-10 DIAGNOSIS — Z4800 Encounter for change or removal of nonsurgical wound dressing: Secondary | ICD-10-CM | POA: Diagnosis not present

## 2021-06-10 DIAGNOSIS — E1151 Type 2 diabetes mellitus with diabetic peripheral angiopathy without gangrene: Secondary | ICD-10-CM | POA: Diagnosis not present

## 2021-06-10 DIAGNOSIS — I872 Venous insufficiency (chronic) (peripheral): Secondary | ICD-10-CM | POA: Diagnosis not present

## 2021-06-10 DIAGNOSIS — I110 Hypertensive heart disease with heart failure: Secondary | ICD-10-CM | POA: Diagnosis not present

## 2021-06-10 DIAGNOSIS — I10 Essential (primary) hypertension: Secondary | ICD-10-CM | POA: Diagnosis not present

## 2021-06-10 DIAGNOSIS — E785 Hyperlipidemia, unspecified: Secondary | ICD-10-CM | POA: Diagnosis not present

## 2021-06-11 DIAGNOSIS — I10 Essential (primary) hypertension: Secondary | ICD-10-CM | POA: Diagnosis not present

## 2021-06-14 DIAGNOSIS — L97818 Non-pressure chronic ulcer of other part of right lower leg with other specified severity: Secondary | ICD-10-CM | POA: Diagnosis not present

## 2021-06-14 DIAGNOSIS — I5032 Chronic diastolic (congestive) heart failure: Secondary | ICD-10-CM | POA: Diagnosis not present

## 2021-06-14 DIAGNOSIS — I110 Hypertensive heart disease with heart failure: Secondary | ICD-10-CM | POA: Diagnosis not present

## 2021-06-14 DIAGNOSIS — Z4800 Encounter for change or removal of nonsurgical wound dressing: Secondary | ICD-10-CM | POA: Diagnosis not present

## 2021-06-14 DIAGNOSIS — I872 Venous insufficiency (chronic) (peripheral): Secondary | ICD-10-CM | POA: Diagnosis not present

## 2021-06-14 DIAGNOSIS — E1151 Type 2 diabetes mellitus with diabetic peripheral angiopathy without gangrene: Secondary | ICD-10-CM | POA: Diagnosis not present

## 2021-06-17 DIAGNOSIS — I872 Venous insufficiency (chronic) (peripheral): Secondary | ICD-10-CM | POA: Diagnosis not present

## 2021-06-17 DIAGNOSIS — I5032 Chronic diastolic (congestive) heart failure: Secondary | ICD-10-CM | POA: Diagnosis not present

## 2021-06-17 DIAGNOSIS — Z4800 Encounter for change or removal of nonsurgical wound dressing: Secondary | ICD-10-CM | POA: Diagnosis not present

## 2021-06-17 DIAGNOSIS — L97818 Non-pressure chronic ulcer of other part of right lower leg with other specified severity: Secondary | ICD-10-CM | POA: Diagnosis not present

## 2021-06-17 DIAGNOSIS — E1151 Type 2 diabetes mellitus with diabetic peripheral angiopathy without gangrene: Secondary | ICD-10-CM | POA: Diagnosis not present

## 2021-06-17 DIAGNOSIS — I110 Hypertensive heart disease with heart failure: Secondary | ICD-10-CM | POA: Diagnosis not present

## 2021-06-18 DIAGNOSIS — I110 Hypertensive heart disease with heart failure: Secondary | ICD-10-CM | POA: Diagnosis not present

## 2021-06-18 DIAGNOSIS — E785 Hyperlipidemia, unspecified: Secondary | ICD-10-CM | POA: Diagnosis not present

## 2021-06-18 DIAGNOSIS — G4700 Insomnia, unspecified: Secondary | ICD-10-CM | POA: Diagnosis not present

## 2021-06-18 DIAGNOSIS — E119 Type 2 diabetes mellitus without complications: Secondary | ICD-10-CM | POA: Diagnosis not present

## 2021-06-18 DIAGNOSIS — I5032 Chronic diastolic (congestive) heart failure: Secondary | ICD-10-CM | POA: Diagnosis not present

## 2021-06-18 DIAGNOSIS — G8929 Other chronic pain: Secondary | ICD-10-CM | POA: Diagnosis not present

## 2021-06-18 DIAGNOSIS — R600 Localized edema: Secondary | ICD-10-CM | POA: Diagnosis not present

## 2021-06-18 DIAGNOSIS — E559 Vitamin D deficiency, unspecified: Secondary | ICD-10-CM | POA: Diagnosis not present

## 2021-06-18 DIAGNOSIS — N1831 Chronic kidney disease, stage 3a: Secondary | ICD-10-CM | POA: Diagnosis not present

## 2021-06-18 DIAGNOSIS — M1990 Unspecified osteoarthritis, unspecified site: Secondary | ICD-10-CM | POA: Diagnosis not present

## 2021-06-20 DIAGNOSIS — I5032 Chronic diastolic (congestive) heart failure: Secondary | ICD-10-CM | POA: Diagnosis not present

## 2021-06-20 DIAGNOSIS — L97818 Non-pressure chronic ulcer of other part of right lower leg with other specified severity: Secondary | ICD-10-CM | POA: Diagnosis not present

## 2021-06-20 DIAGNOSIS — I872 Venous insufficiency (chronic) (peripheral): Secondary | ICD-10-CM | POA: Diagnosis not present

## 2021-06-20 DIAGNOSIS — I110 Hypertensive heart disease with heart failure: Secondary | ICD-10-CM | POA: Diagnosis not present

## 2021-06-20 DIAGNOSIS — Z4800 Encounter for change or removal of nonsurgical wound dressing: Secondary | ICD-10-CM | POA: Diagnosis not present

## 2021-06-20 DIAGNOSIS — E1151 Type 2 diabetes mellitus with diabetic peripheral angiopathy without gangrene: Secondary | ICD-10-CM | POA: Diagnosis not present

## 2021-06-21 DIAGNOSIS — E559 Vitamin D deficiency, unspecified: Secondary | ICD-10-CM | POA: Diagnosis not present

## 2021-06-21 DIAGNOSIS — E1151 Type 2 diabetes mellitus with diabetic peripheral angiopathy without gangrene: Secondary | ICD-10-CM | POA: Diagnosis not present

## 2021-06-21 DIAGNOSIS — M1990 Unspecified osteoarthritis, unspecified site: Secondary | ICD-10-CM | POA: Diagnosis not present

## 2021-06-21 DIAGNOSIS — E782 Mixed hyperlipidemia: Secondary | ICD-10-CM | POA: Diagnosis not present

## 2021-06-21 DIAGNOSIS — I10 Essential (primary) hypertension: Secondary | ICD-10-CM | POA: Diagnosis not present

## 2021-06-21 DIAGNOSIS — E785 Hyperlipidemia, unspecified: Secondary | ICD-10-CM | POA: Diagnosis not present

## 2021-06-21 DIAGNOSIS — I5032 Chronic diastolic (congestive) heart failure: Secondary | ICD-10-CM | POA: Diagnosis not present

## 2021-06-21 DIAGNOSIS — I509 Heart failure, unspecified: Secondary | ICD-10-CM | POA: Diagnosis not present

## 2021-06-21 DIAGNOSIS — D518 Other vitamin B12 deficiency anemias: Secondary | ICD-10-CM | POA: Diagnosis not present

## 2021-06-21 DIAGNOSIS — E119 Type 2 diabetes mellitus without complications: Secondary | ICD-10-CM | POA: Diagnosis not present

## 2021-06-21 DIAGNOSIS — E038 Other specified hypothyroidism: Secondary | ICD-10-CM | POA: Diagnosis not present

## 2021-06-24 DIAGNOSIS — Z4800 Encounter for change or removal of nonsurgical wound dressing: Secondary | ICD-10-CM | POA: Diagnosis not present

## 2021-06-24 DIAGNOSIS — I5032 Chronic diastolic (congestive) heart failure: Secondary | ICD-10-CM | POA: Diagnosis not present

## 2021-06-24 DIAGNOSIS — L97818 Non-pressure chronic ulcer of other part of right lower leg with other specified severity: Secondary | ICD-10-CM | POA: Diagnosis not present

## 2021-06-24 DIAGNOSIS — I872 Venous insufficiency (chronic) (peripheral): Secondary | ICD-10-CM | POA: Diagnosis not present

## 2021-06-24 DIAGNOSIS — E1151 Type 2 diabetes mellitus with diabetic peripheral angiopathy without gangrene: Secondary | ICD-10-CM | POA: Diagnosis not present

## 2021-06-24 DIAGNOSIS — I110 Hypertensive heart disease with heart failure: Secondary | ICD-10-CM | POA: Diagnosis not present

## 2021-06-27 DIAGNOSIS — I5032 Chronic diastolic (congestive) heart failure: Secondary | ICD-10-CM | POA: Diagnosis not present

## 2021-06-27 DIAGNOSIS — L97818 Non-pressure chronic ulcer of other part of right lower leg with other specified severity: Secondary | ICD-10-CM | POA: Diagnosis not present

## 2021-06-27 DIAGNOSIS — Z4800 Encounter for change or removal of nonsurgical wound dressing: Secondary | ICD-10-CM | POA: Diagnosis not present

## 2021-06-27 DIAGNOSIS — I110 Hypertensive heart disease with heart failure: Secondary | ICD-10-CM | POA: Diagnosis not present

## 2021-06-27 DIAGNOSIS — I872 Venous insufficiency (chronic) (peripheral): Secondary | ICD-10-CM | POA: Diagnosis not present

## 2021-06-27 DIAGNOSIS — E1151 Type 2 diabetes mellitus with diabetic peripheral angiopathy without gangrene: Secondary | ICD-10-CM | POA: Diagnosis not present

## 2021-07-01 DIAGNOSIS — I5032 Chronic diastolic (congestive) heart failure: Secondary | ICD-10-CM | POA: Diagnosis not present

## 2021-07-01 DIAGNOSIS — I872 Venous insufficiency (chronic) (peripheral): Secondary | ICD-10-CM | POA: Diagnosis not present

## 2021-07-01 DIAGNOSIS — I110 Hypertensive heart disease with heart failure: Secondary | ICD-10-CM | POA: Diagnosis not present

## 2021-07-01 DIAGNOSIS — L97818 Non-pressure chronic ulcer of other part of right lower leg with other specified severity: Secondary | ICD-10-CM | POA: Diagnosis not present

## 2021-07-01 DIAGNOSIS — Z4800 Encounter for change or removal of nonsurgical wound dressing: Secondary | ICD-10-CM | POA: Diagnosis not present

## 2021-07-01 DIAGNOSIS — E1151 Type 2 diabetes mellitus with diabetic peripheral angiopathy without gangrene: Secondary | ICD-10-CM | POA: Diagnosis not present

## 2021-07-02 DIAGNOSIS — M7989 Other specified soft tissue disorders: Secondary | ICD-10-CM | POA: Diagnosis not present

## 2021-07-02 DIAGNOSIS — M79645 Pain in left finger(s): Secondary | ICD-10-CM | POA: Diagnosis not present

## 2021-07-02 DIAGNOSIS — Z79899 Other long term (current) drug therapy: Secondary | ICD-10-CM | POA: Diagnosis not present

## 2021-07-02 DIAGNOSIS — M109 Gout, unspecified: Secondary | ICD-10-CM | POA: Diagnosis not present

## 2021-07-04 DIAGNOSIS — I5032 Chronic diastolic (congestive) heart failure: Secondary | ICD-10-CM | POA: Diagnosis not present

## 2021-07-04 DIAGNOSIS — I110 Hypertensive heart disease with heart failure: Secondary | ICD-10-CM | POA: Diagnosis not present

## 2021-07-04 DIAGNOSIS — E790 Hyperuricemia without signs of inflammatory arthritis and tophaceous disease: Secondary | ICD-10-CM | POA: Diagnosis not present

## 2021-07-04 DIAGNOSIS — L97818 Non-pressure chronic ulcer of other part of right lower leg with other specified severity: Secondary | ICD-10-CM | POA: Diagnosis not present

## 2021-07-04 DIAGNOSIS — Z4800 Encounter for change or removal of nonsurgical wound dressing: Secondary | ICD-10-CM | POA: Diagnosis not present

## 2021-07-04 DIAGNOSIS — I872 Venous insufficiency (chronic) (peripheral): Secondary | ICD-10-CM | POA: Diagnosis not present

## 2021-07-04 DIAGNOSIS — Z79899 Other long term (current) drug therapy: Secondary | ICD-10-CM | POA: Diagnosis not present

## 2021-07-04 DIAGNOSIS — E1151 Type 2 diabetes mellitus with diabetic peripheral angiopathy without gangrene: Secondary | ICD-10-CM | POA: Diagnosis not present

## 2021-07-07 DIAGNOSIS — E119 Type 2 diabetes mellitus without complications: Secondary | ICD-10-CM | POA: Diagnosis not present

## 2021-07-09 DIAGNOSIS — E1151 Type 2 diabetes mellitus with diabetic peripheral angiopathy without gangrene: Secondary | ICD-10-CM | POA: Diagnosis not present

## 2021-07-09 DIAGNOSIS — L97818 Non-pressure chronic ulcer of other part of right lower leg with other specified severity: Secondary | ICD-10-CM | POA: Diagnosis not present

## 2021-07-09 DIAGNOSIS — I872 Venous insufficiency (chronic) (peripheral): Secondary | ICD-10-CM | POA: Diagnosis not present

## 2021-07-09 DIAGNOSIS — Z4800 Encounter for change or removal of nonsurgical wound dressing: Secondary | ICD-10-CM | POA: Diagnosis not present

## 2021-07-09 DIAGNOSIS — I5032 Chronic diastolic (congestive) heart failure: Secondary | ICD-10-CM | POA: Diagnosis not present

## 2021-07-09 DIAGNOSIS — I110 Hypertensive heart disease with heart failure: Secondary | ICD-10-CM | POA: Diagnosis not present

## 2021-07-12 DIAGNOSIS — I110 Hypertensive heart disease with heart failure: Secondary | ICD-10-CM | POA: Diagnosis not present

## 2021-07-12 DIAGNOSIS — I5032 Chronic diastolic (congestive) heart failure: Secondary | ICD-10-CM | POA: Diagnosis not present

## 2021-07-12 DIAGNOSIS — E1151 Type 2 diabetes mellitus with diabetic peripheral angiopathy without gangrene: Secondary | ICD-10-CM | POA: Diagnosis not present

## 2021-07-12 DIAGNOSIS — L97818 Non-pressure chronic ulcer of other part of right lower leg with other specified severity: Secondary | ICD-10-CM | POA: Diagnosis not present

## 2021-07-12 DIAGNOSIS — I872 Venous insufficiency (chronic) (peripheral): Secondary | ICD-10-CM | POA: Diagnosis not present

## 2021-07-12 DIAGNOSIS — Z4800 Encounter for change or removal of nonsurgical wound dressing: Secondary | ICD-10-CM | POA: Diagnosis not present

## 2021-07-16 DIAGNOSIS — Z4800 Encounter for change or removal of nonsurgical wound dressing: Secondary | ICD-10-CM | POA: Diagnosis not present

## 2021-07-16 DIAGNOSIS — I110 Hypertensive heart disease with heart failure: Secondary | ICD-10-CM | POA: Diagnosis not present

## 2021-07-16 DIAGNOSIS — I872 Venous insufficiency (chronic) (peripheral): Secondary | ICD-10-CM | POA: Diagnosis not present

## 2021-07-16 DIAGNOSIS — L97818 Non-pressure chronic ulcer of other part of right lower leg with other specified severity: Secondary | ICD-10-CM | POA: Diagnosis not present

## 2021-07-16 DIAGNOSIS — E1151 Type 2 diabetes mellitus with diabetic peripheral angiopathy without gangrene: Secondary | ICD-10-CM | POA: Diagnosis not present

## 2021-07-16 DIAGNOSIS — I5032 Chronic diastolic (congestive) heart failure: Secondary | ICD-10-CM | POA: Diagnosis not present

## 2021-07-17 DIAGNOSIS — E119 Type 2 diabetes mellitus without complications: Secondary | ICD-10-CM | POA: Diagnosis not present

## 2021-07-17 DIAGNOSIS — E1151 Type 2 diabetes mellitus with diabetic peripheral angiopathy without gangrene: Secondary | ICD-10-CM | POA: Diagnosis not present

## 2021-07-17 DIAGNOSIS — E038 Other specified hypothyroidism: Secondary | ICD-10-CM | POA: Diagnosis not present

## 2021-07-17 DIAGNOSIS — M1990 Unspecified osteoarthritis, unspecified site: Secondary | ICD-10-CM | POA: Diagnosis not present

## 2021-07-17 DIAGNOSIS — I5032 Chronic diastolic (congestive) heart failure: Secondary | ICD-10-CM | POA: Diagnosis not present

## 2021-07-17 DIAGNOSIS — E782 Mixed hyperlipidemia: Secondary | ICD-10-CM | POA: Diagnosis not present

## 2021-07-17 DIAGNOSIS — E559 Vitamin D deficiency, unspecified: Secondary | ICD-10-CM | POA: Diagnosis not present

## 2021-07-17 DIAGNOSIS — I509 Heart failure, unspecified: Secondary | ICD-10-CM | POA: Diagnosis not present

## 2021-07-17 DIAGNOSIS — E785 Hyperlipidemia, unspecified: Secondary | ICD-10-CM | POA: Diagnosis not present

## 2021-07-17 DIAGNOSIS — D518 Other vitamin B12 deficiency anemias: Secondary | ICD-10-CM | POA: Diagnosis not present

## 2021-07-17 DIAGNOSIS — I10 Essential (primary) hypertension: Secondary | ICD-10-CM | POA: Diagnosis not present

## 2021-07-18 DIAGNOSIS — E1151 Type 2 diabetes mellitus with diabetic peripheral angiopathy without gangrene: Secondary | ICD-10-CM | POA: Diagnosis not present

## 2021-07-18 DIAGNOSIS — L97818 Non-pressure chronic ulcer of other part of right lower leg with other specified severity: Secondary | ICD-10-CM | POA: Diagnosis not present

## 2021-07-18 DIAGNOSIS — Z4800 Encounter for change or removal of nonsurgical wound dressing: Secondary | ICD-10-CM | POA: Diagnosis not present

## 2021-07-18 DIAGNOSIS — I5032 Chronic diastolic (congestive) heart failure: Secondary | ICD-10-CM | POA: Diagnosis not present

## 2021-07-18 DIAGNOSIS — I110 Hypertensive heart disease with heart failure: Secondary | ICD-10-CM | POA: Diagnosis not present

## 2021-07-18 DIAGNOSIS — I872 Venous insufficiency (chronic) (peripheral): Secondary | ICD-10-CM | POA: Diagnosis not present

## 2021-07-23 DIAGNOSIS — L97818 Non-pressure chronic ulcer of other part of right lower leg with other specified severity: Secondary | ICD-10-CM | POA: Diagnosis not present

## 2021-07-23 DIAGNOSIS — I5032 Chronic diastolic (congestive) heart failure: Secondary | ICD-10-CM | POA: Diagnosis not present

## 2021-07-23 DIAGNOSIS — I110 Hypertensive heart disease with heart failure: Secondary | ICD-10-CM | POA: Diagnosis not present

## 2021-07-23 DIAGNOSIS — Z4800 Encounter for change or removal of nonsurgical wound dressing: Secondary | ICD-10-CM | POA: Diagnosis not present

## 2021-07-23 DIAGNOSIS — I872 Venous insufficiency (chronic) (peripheral): Secondary | ICD-10-CM | POA: Diagnosis not present

## 2021-07-23 DIAGNOSIS — E1151 Type 2 diabetes mellitus with diabetic peripheral angiopathy without gangrene: Secondary | ICD-10-CM | POA: Diagnosis not present

## 2021-07-25 DIAGNOSIS — I872 Venous insufficiency (chronic) (peripheral): Secondary | ICD-10-CM | POA: Diagnosis not present

## 2021-07-25 DIAGNOSIS — I110 Hypertensive heart disease with heart failure: Secondary | ICD-10-CM | POA: Diagnosis not present

## 2021-07-25 DIAGNOSIS — I5032 Chronic diastolic (congestive) heart failure: Secondary | ICD-10-CM | POA: Diagnosis not present

## 2021-07-25 DIAGNOSIS — L97818 Non-pressure chronic ulcer of other part of right lower leg with other specified severity: Secondary | ICD-10-CM | POA: Diagnosis not present

## 2021-07-25 DIAGNOSIS — E1151 Type 2 diabetes mellitus with diabetic peripheral angiopathy without gangrene: Secondary | ICD-10-CM | POA: Diagnosis not present

## 2021-07-25 DIAGNOSIS — Z4800 Encounter for change or removal of nonsurgical wound dressing: Secondary | ICD-10-CM | POA: Diagnosis not present

## 2021-07-29 DIAGNOSIS — U071 COVID-19: Secondary | ICD-10-CM | POA: Diagnosis not present

## 2021-07-30 DIAGNOSIS — I5032 Chronic diastolic (congestive) heart failure: Secondary | ICD-10-CM | POA: Diagnosis not present

## 2021-07-30 DIAGNOSIS — Z4800 Encounter for change or removal of nonsurgical wound dressing: Secondary | ICD-10-CM | POA: Diagnosis not present

## 2021-07-30 DIAGNOSIS — I872 Venous insufficiency (chronic) (peripheral): Secondary | ICD-10-CM | POA: Diagnosis not present

## 2021-07-30 DIAGNOSIS — L97818 Non-pressure chronic ulcer of other part of right lower leg with other specified severity: Secondary | ICD-10-CM | POA: Diagnosis not present

## 2021-07-30 DIAGNOSIS — I110 Hypertensive heart disease with heart failure: Secondary | ICD-10-CM | POA: Diagnosis not present

## 2021-07-30 DIAGNOSIS — E1151 Type 2 diabetes mellitus with diabetic peripheral angiopathy without gangrene: Secondary | ICD-10-CM | POA: Diagnosis not present

## 2021-08-01 DIAGNOSIS — I5032 Chronic diastolic (congestive) heart failure: Secondary | ICD-10-CM | POA: Diagnosis not present

## 2021-08-01 DIAGNOSIS — R600 Localized edema: Secondary | ICD-10-CM | POA: Diagnosis not present

## 2021-08-01 DIAGNOSIS — B351 Tinea unguium: Secondary | ICD-10-CM | POA: Diagnosis not present

## 2021-08-01 DIAGNOSIS — E785 Hyperlipidemia, unspecified: Secondary | ICD-10-CM | POA: Diagnosis not present

## 2021-08-01 DIAGNOSIS — E538 Deficiency of other specified B group vitamins: Secondary | ICD-10-CM | POA: Diagnosis not present

## 2021-08-01 DIAGNOSIS — G8929 Other chronic pain: Secondary | ICD-10-CM | POA: Diagnosis not present

## 2021-08-01 DIAGNOSIS — G4700 Insomnia, unspecified: Secondary | ICD-10-CM | POA: Diagnosis not present

## 2021-08-01 DIAGNOSIS — L603 Nail dystrophy: Secondary | ICD-10-CM | POA: Diagnosis not present

## 2021-08-01 DIAGNOSIS — E119 Type 2 diabetes mellitus without complications: Secondary | ICD-10-CM | POA: Diagnosis not present

## 2021-08-01 DIAGNOSIS — I110 Hypertensive heart disease with heart failure: Secondary | ICD-10-CM | POA: Diagnosis not present

## 2021-08-01 DIAGNOSIS — N1831 Chronic kidney disease, stage 3a: Secondary | ICD-10-CM | POA: Diagnosis not present

## 2021-08-01 DIAGNOSIS — E1159 Type 2 diabetes mellitus with other circulatory complications: Secondary | ICD-10-CM | POA: Diagnosis not present

## 2021-08-01 DIAGNOSIS — M1990 Unspecified osteoarthritis, unspecified site: Secondary | ICD-10-CM | POA: Diagnosis not present

## 2021-08-01 DIAGNOSIS — I10 Essential (primary) hypertension: Secondary | ICD-10-CM | POA: Diagnosis not present

## 2021-08-02 DIAGNOSIS — Z4800 Encounter for change or removal of nonsurgical wound dressing: Secondary | ICD-10-CM | POA: Diagnosis not present

## 2021-08-02 DIAGNOSIS — I5032 Chronic diastolic (congestive) heart failure: Secondary | ICD-10-CM | POA: Diagnosis not present

## 2021-08-02 DIAGNOSIS — E1151 Type 2 diabetes mellitus with diabetic peripheral angiopathy without gangrene: Secondary | ICD-10-CM | POA: Diagnosis not present

## 2021-08-02 DIAGNOSIS — I110 Hypertensive heart disease with heart failure: Secondary | ICD-10-CM | POA: Diagnosis not present

## 2021-08-02 DIAGNOSIS — L97818 Non-pressure chronic ulcer of other part of right lower leg with other specified severity: Secondary | ICD-10-CM | POA: Diagnosis not present

## 2021-08-02 DIAGNOSIS — I872 Venous insufficiency (chronic) (peripheral): Secondary | ICD-10-CM | POA: Diagnosis not present

## 2021-08-05 DIAGNOSIS — U071 COVID-19: Secondary | ICD-10-CM | POA: Diagnosis not present

## 2021-08-06 DIAGNOSIS — I872 Venous insufficiency (chronic) (peripheral): Secondary | ICD-10-CM | POA: Diagnosis not present

## 2021-08-06 DIAGNOSIS — E1151 Type 2 diabetes mellitus with diabetic peripheral angiopathy without gangrene: Secondary | ICD-10-CM | POA: Diagnosis not present

## 2021-08-06 DIAGNOSIS — I110 Hypertensive heart disease with heart failure: Secondary | ICD-10-CM | POA: Diagnosis not present

## 2021-08-06 DIAGNOSIS — I5032 Chronic diastolic (congestive) heart failure: Secondary | ICD-10-CM | POA: Diagnosis not present

## 2021-08-06 DIAGNOSIS — L97818 Non-pressure chronic ulcer of other part of right lower leg with other specified severity: Secondary | ICD-10-CM | POA: Diagnosis not present

## 2021-08-06 DIAGNOSIS — Z4800 Encounter for change or removal of nonsurgical wound dressing: Secondary | ICD-10-CM | POA: Diagnosis not present

## 2021-08-09 DIAGNOSIS — L97818 Non-pressure chronic ulcer of other part of right lower leg with other specified severity: Secondary | ICD-10-CM | POA: Diagnosis not present

## 2021-08-09 DIAGNOSIS — Z4800 Encounter for change or removal of nonsurgical wound dressing: Secondary | ICD-10-CM | POA: Diagnosis not present

## 2021-08-09 DIAGNOSIS — I10 Essential (primary) hypertension: Secondary | ICD-10-CM | POA: Diagnosis not present

## 2021-08-09 DIAGNOSIS — E1151 Type 2 diabetes mellitus with diabetic peripheral angiopathy without gangrene: Secondary | ICD-10-CM | POA: Diagnosis not present

## 2021-08-09 DIAGNOSIS — I872 Venous insufficiency (chronic) (peripheral): Secondary | ICD-10-CM | POA: Diagnosis not present

## 2021-08-09 DIAGNOSIS — I5032 Chronic diastolic (congestive) heart failure: Secondary | ICD-10-CM | POA: Diagnosis not present

## 2021-08-09 DIAGNOSIS — I110 Hypertensive heart disease with heart failure: Secondary | ICD-10-CM | POA: Diagnosis not present

## 2021-08-12 DIAGNOSIS — E1151 Type 2 diabetes mellitus with diabetic peripheral angiopathy without gangrene: Secondary | ICD-10-CM | POA: Diagnosis not present

## 2021-08-12 DIAGNOSIS — I5032 Chronic diastolic (congestive) heart failure: Secondary | ICD-10-CM | POA: Diagnosis not present

## 2021-08-12 DIAGNOSIS — I110 Hypertensive heart disease with heart failure: Secondary | ICD-10-CM | POA: Diagnosis not present

## 2021-08-12 DIAGNOSIS — L97818 Non-pressure chronic ulcer of other part of right lower leg with other specified severity: Secondary | ICD-10-CM | POA: Diagnosis not present

## 2021-08-12 DIAGNOSIS — Z4800 Encounter for change or removal of nonsurgical wound dressing: Secondary | ICD-10-CM | POA: Diagnosis not present

## 2021-08-12 DIAGNOSIS — I872 Venous insufficiency (chronic) (peripheral): Secondary | ICD-10-CM | POA: Diagnosis not present

## 2021-08-16 DIAGNOSIS — D518 Other vitamin B12 deficiency anemias: Secondary | ICD-10-CM | POA: Diagnosis not present

## 2021-08-16 DIAGNOSIS — I872 Venous insufficiency (chronic) (peripheral): Secondary | ICD-10-CM | POA: Diagnosis not present

## 2021-08-16 DIAGNOSIS — I110 Hypertensive heart disease with heart failure: Secondary | ICD-10-CM | POA: Diagnosis not present

## 2021-08-16 DIAGNOSIS — E119 Type 2 diabetes mellitus without complications: Secondary | ICD-10-CM | POA: Diagnosis not present

## 2021-08-16 DIAGNOSIS — I10 Essential (primary) hypertension: Secondary | ICD-10-CM | POA: Diagnosis not present

## 2021-08-16 DIAGNOSIS — E782 Mixed hyperlipidemia: Secondary | ICD-10-CM | POA: Diagnosis not present

## 2021-08-16 DIAGNOSIS — L97818 Non-pressure chronic ulcer of other part of right lower leg with other specified severity: Secondary | ICD-10-CM | POA: Diagnosis not present

## 2021-08-16 DIAGNOSIS — E1151 Type 2 diabetes mellitus with diabetic peripheral angiopathy without gangrene: Secondary | ICD-10-CM | POA: Diagnosis not present

## 2021-08-16 DIAGNOSIS — M1990 Unspecified osteoarthritis, unspecified site: Secondary | ICD-10-CM | POA: Diagnosis not present

## 2021-08-16 DIAGNOSIS — I5032 Chronic diastolic (congestive) heart failure: Secondary | ICD-10-CM | POA: Diagnosis not present

## 2021-08-16 DIAGNOSIS — Z4800 Encounter for change or removal of nonsurgical wound dressing: Secondary | ICD-10-CM | POA: Diagnosis not present

## 2021-08-16 DIAGNOSIS — I509 Heart failure, unspecified: Secondary | ICD-10-CM | POA: Diagnosis not present

## 2021-08-16 DIAGNOSIS — E038 Other specified hypothyroidism: Secondary | ICD-10-CM | POA: Diagnosis not present

## 2021-08-16 DIAGNOSIS — E559 Vitamin D deficiency, unspecified: Secondary | ICD-10-CM | POA: Diagnosis not present

## 2021-08-16 DIAGNOSIS — E785 Hyperlipidemia, unspecified: Secondary | ICD-10-CM | POA: Diagnosis not present

## 2021-08-20 DIAGNOSIS — L97818 Non-pressure chronic ulcer of other part of right lower leg with other specified severity: Secondary | ICD-10-CM | POA: Diagnosis not present

## 2021-08-20 DIAGNOSIS — Z4800 Encounter for change or removal of nonsurgical wound dressing: Secondary | ICD-10-CM | POA: Diagnosis not present

## 2021-08-20 DIAGNOSIS — I110 Hypertensive heart disease with heart failure: Secondary | ICD-10-CM | POA: Diagnosis not present

## 2021-08-20 DIAGNOSIS — E1151 Type 2 diabetes mellitus with diabetic peripheral angiopathy without gangrene: Secondary | ICD-10-CM | POA: Diagnosis not present

## 2021-08-20 DIAGNOSIS — I5032 Chronic diastolic (congestive) heart failure: Secondary | ICD-10-CM | POA: Diagnosis not present

## 2021-08-20 DIAGNOSIS — I872 Venous insufficiency (chronic) (peripheral): Secondary | ICD-10-CM | POA: Diagnosis not present

## 2021-08-22 DIAGNOSIS — Z4800 Encounter for change or removal of nonsurgical wound dressing: Secondary | ICD-10-CM | POA: Diagnosis not present

## 2021-08-22 DIAGNOSIS — I110 Hypertensive heart disease with heart failure: Secondary | ICD-10-CM | POA: Diagnosis not present

## 2021-08-22 DIAGNOSIS — E1151 Type 2 diabetes mellitus with diabetic peripheral angiopathy without gangrene: Secondary | ICD-10-CM | POA: Diagnosis not present

## 2021-08-22 DIAGNOSIS — I5032 Chronic diastolic (congestive) heart failure: Secondary | ICD-10-CM | POA: Diagnosis not present

## 2021-08-22 DIAGNOSIS — L97818 Non-pressure chronic ulcer of other part of right lower leg with other specified severity: Secondary | ICD-10-CM | POA: Diagnosis not present

## 2021-08-22 DIAGNOSIS — I872 Venous insufficiency (chronic) (peripheral): Secondary | ICD-10-CM | POA: Diagnosis not present

## 2021-08-27 DIAGNOSIS — I5032 Chronic diastolic (congestive) heart failure: Secondary | ICD-10-CM | POA: Diagnosis not present

## 2021-08-27 DIAGNOSIS — E1151 Type 2 diabetes mellitus with diabetic peripheral angiopathy without gangrene: Secondary | ICD-10-CM | POA: Diagnosis not present

## 2021-08-27 DIAGNOSIS — I110 Hypertensive heart disease with heart failure: Secondary | ICD-10-CM | POA: Diagnosis not present

## 2021-08-27 DIAGNOSIS — L97818 Non-pressure chronic ulcer of other part of right lower leg with other specified severity: Secondary | ICD-10-CM | POA: Diagnosis not present

## 2021-08-27 DIAGNOSIS — I872 Venous insufficiency (chronic) (peripheral): Secondary | ICD-10-CM | POA: Diagnosis not present

## 2021-08-27 DIAGNOSIS — Z4800 Encounter for change or removal of nonsurgical wound dressing: Secondary | ICD-10-CM | POA: Diagnosis not present

## 2021-08-29 DIAGNOSIS — G4700 Insomnia, unspecified: Secondary | ICD-10-CM | POA: Diagnosis not present

## 2021-08-29 DIAGNOSIS — N1831 Chronic kidney disease, stage 3a: Secondary | ICD-10-CM | POA: Diagnosis not present

## 2021-08-29 DIAGNOSIS — E785 Hyperlipidemia, unspecified: Secondary | ICD-10-CM | POA: Diagnosis not present

## 2021-08-29 DIAGNOSIS — I110 Hypertensive heart disease with heart failure: Secondary | ICD-10-CM | POA: Diagnosis not present

## 2021-08-29 DIAGNOSIS — I5032 Chronic diastolic (congestive) heart failure: Secondary | ICD-10-CM | POA: Diagnosis not present

## 2021-08-29 DIAGNOSIS — G8929 Other chronic pain: Secondary | ICD-10-CM | POA: Diagnosis not present

## 2021-08-29 DIAGNOSIS — E559 Vitamin D deficiency, unspecified: Secondary | ICD-10-CM | POA: Diagnosis not present

## 2021-08-29 DIAGNOSIS — E119 Type 2 diabetes mellitus without complications: Secondary | ICD-10-CM | POA: Diagnosis not present

## 2021-08-29 DIAGNOSIS — M109 Gout, unspecified: Secondary | ICD-10-CM | POA: Diagnosis not present

## 2021-08-29 DIAGNOSIS — R600 Localized edema: Secondary | ICD-10-CM | POA: Diagnosis not present

## 2021-08-30 DIAGNOSIS — I5032 Chronic diastolic (congestive) heart failure: Secondary | ICD-10-CM | POA: Diagnosis not present

## 2021-08-30 DIAGNOSIS — I872 Venous insufficiency (chronic) (peripheral): Secondary | ICD-10-CM | POA: Diagnosis not present

## 2021-08-30 DIAGNOSIS — Z4800 Encounter for change or removal of nonsurgical wound dressing: Secondary | ICD-10-CM | POA: Diagnosis not present

## 2021-08-30 DIAGNOSIS — I110 Hypertensive heart disease with heart failure: Secondary | ICD-10-CM | POA: Diagnosis not present

## 2021-08-30 DIAGNOSIS — L97818 Non-pressure chronic ulcer of other part of right lower leg with other specified severity: Secondary | ICD-10-CM | POA: Diagnosis not present

## 2021-08-30 DIAGNOSIS — E1151 Type 2 diabetes mellitus with diabetic peripheral angiopathy without gangrene: Secondary | ICD-10-CM | POA: Diagnosis not present

## 2021-09-03 DIAGNOSIS — E1151 Type 2 diabetes mellitus with diabetic peripheral angiopathy without gangrene: Secondary | ICD-10-CM | POA: Diagnosis not present

## 2021-09-03 DIAGNOSIS — I110 Hypertensive heart disease with heart failure: Secondary | ICD-10-CM | POA: Diagnosis not present

## 2021-09-03 DIAGNOSIS — L97818 Non-pressure chronic ulcer of other part of right lower leg with other specified severity: Secondary | ICD-10-CM | POA: Diagnosis not present

## 2021-09-03 DIAGNOSIS — I5032 Chronic diastolic (congestive) heart failure: Secondary | ICD-10-CM | POA: Diagnosis not present

## 2021-09-03 DIAGNOSIS — Z4800 Encounter for change or removal of nonsurgical wound dressing: Secondary | ICD-10-CM | POA: Diagnosis not present

## 2021-09-03 DIAGNOSIS — I872 Venous insufficiency (chronic) (peripheral): Secondary | ICD-10-CM | POA: Diagnosis not present

## 2021-09-06 DIAGNOSIS — I110 Hypertensive heart disease with heart failure: Secondary | ICD-10-CM | POA: Diagnosis not present

## 2021-09-06 DIAGNOSIS — L97818 Non-pressure chronic ulcer of other part of right lower leg with other specified severity: Secondary | ICD-10-CM | POA: Diagnosis not present

## 2021-09-06 DIAGNOSIS — I5032 Chronic diastolic (congestive) heart failure: Secondary | ICD-10-CM | POA: Diagnosis not present

## 2021-09-06 DIAGNOSIS — Z4800 Encounter for change or removal of nonsurgical wound dressing: Secondary | ICD-10-CM | POA: Diagnosis not present

## 2021-09-06 DIAGNOSIS — I872 Venous insufficiency (chronic) (peripheral): Secondary | ICD-10-CM | POA: Diagnosis not present

## 2021-09-06 DIAGNOSIS — E1151 Type 2 diabetes mellitus with diabetic peripheral angiopathy without gangrene: Secondary | ICD-10-CM | POA: Diagnosis not present

## 2021-09-09 DIAGNOSIS — I110 Hypertensive heart disease with heart failure: Secondary | ICD-10-CM | POA: Diagnosis not present

## 2021-09-09 DIAGNOSIS — E1151 Type 2 diabetes mellitus with diabetic peripheral angiopathy without gangrene: Secondary | ICD-10-CM | POA: Diagnosis not present

## 2021-09-09 DIAGNOSIS — I10 Essential (primary) hypertension: Secondary | ICD-10-CM | POA: Diagnosis not present

## 2021-09-09 DIAGNOSIS — I872 Venous insufficiency (chronic) (peripheral): Secondary | ICD-10-CM | POA: Diagnosis not present

## 2021-09-09 DIAGNOSIS — Z4800 Encounter for change or removal of nonsurgical wound dressing: Secondary | ICD-10-CM | POA: Diagnosis not present

## 2021-09-09 DIAGNOSIS — I5032 Chronic diastolic (congestive) heart failure: Secondary | ICD-10-CM | POA: Diagnosis not present

## 2021-09-09 DIAGNOSIS — L97818 Non-pressure chronic ulcer of other part of right lower leg with other specified severity: Secondary | ICD-10-CM | POA: Diagnosis not present

## 2021-09-11 DIAGNOSIS — I10 Essential (primary) hypertension: Secondary | ICD-10-CM | POA: Diagnosis not present

## 2021-09-11 DIAGNOSIS — E1151 Type 2 diabetes mellitus with diabetic peripheral angiopathy without gangrene: Secondary | ICD-10-CM | POA: Diagnosis not present

## 2021-09-11 DIAGNOSIS — Z4800 Encounter for change or removal of nonsurgical wound dressing: Secondary | ICD-10-CM | POA: Diagnosis not present

## 2021-09-11 DIAGNOSIS — I872 Venous insufficiency (chronic) (peripheral): Secondary | ICD-10-CM | POA: Diagnosis not present

## 2021-09-11 DIAGNOSIS — I5032 Chronic diastolic (congestive) heart failure: Secondary | ICD-10-CM | POA: Diagnosis not present

## 2021-09-11 DIAGNOSIS — L97818 Non-pressure chronic ulcer of other part of right lower leg with other specified severity: Secondary | ICD-10-CM | POA: Diagnosis not present

## 2021-09-11 DIAGNOSIS — F0394 Unspecified dementia, unspecified severity, with anxiety: Secondary | ICD-10-CM | POA: Diagnosis not present

## 2021-09-12 DIAGNOSIS — I110 Hypertensive heart disease with heart failure: Secondary | ICD-10-CM | POA: Diagnosis not present

## 2021-09-12 DIAGNOSIS — I5032 Chronic diastolic (congestive) heart failure: Secondary | ICD-10-CM | POA: Diagnosis not present

## 2021-09-12 DIAGNOSIS — L97818 Non-pressure chronic ulcer of other part of right lower leg with other specified severity: Secondary | ICD-10-CM | POA: Diagnosis not present

## 2021-09-12 DIAGNOSIS — E1151 Type 2 diabetes mellitus with diabetic peripheral angiopathy without gangrene: Secondary | ICD-10-CM | POA: Diagnosis not present

## 2021-09-12 DIAGNOSIS — Z4800 Encounter for change or removal of nonsurgical wound dressing: Secondary | ICD-10-CM | POA: Diagnosis not present

## 2021-09-12 DIAGNOSIS — I872 Venous insufficiency (chronic) (peripheral): Secondary | ICD-10-CM | POA: Diagnosis not present

## 2021-09-13 DIAGNOSIS — E559 Vitamin D deficiency, unspecified: Secondary | ICD-10-CM | POA: Diagnosis not present

## 2021-09-13 DIAGNOSIS — I509 Heart failure, unspecified: Secondary | ICD-10-CM | POA: Diagnosis not present

## 2021-09-13 DIAGNOSIS — I10 Essential (primary) hypertension: Secondary | ICD-10-CM | POA: Diagnosis not present

## 2021-09-13 DIAGNOSIS — E782 Mixed hyperlipidemia: Secondary | ICD-10-CM | POA: Diagnosis not present

## 2021-09-13 DIAGNOSIS — M1990 Unspecified osteoarthritis, unspecified site: Secondary | ICD-10-CM | POA: Diagnosis not present

## 2021-09-13 DIAGNOSIS — E1151 Type 2 diabetes mellitus with diabetic peripheral angiopathy without gangrene: Secondary | ICD-10-CM | POA: Diagnosis not present

## 2021-09-13 DIAGNOSIS — I5032 Chronic diastolic (congestive) heart failure: Secondary | ICD-10-CM | POA: Diagnosis not present

## 2021-09-13 DIAGNOSIS — D518 Other vitamin B12 deficiency anemias: Secondary | ICD-10-CM | POA: Diagnosis not present

## 2021-09-13 DIAGNOSIS — E038 Other specified hypothyroidism: Secondary | ICD-10-CM | POA: Diagnosis not present

## 2021-09-13 DIAGNOSIS — E785 Hyperlipidemia, unspecified: Secondary | ICD-10-CM | POA: Diagnosis not present

## 2021-09-13 DIAGNOSIS — E119 Type 2 diabetes mellitus without complications: Secondary | ICD-10-CM | POA: Diagnosis not present

## 2021-09-18 DIAGNOSIS — E1151 Type 2 diabetes mellitus with diabetic peripheral angiopathy without gangrene: Secondary | ICD-10-CM | POA: Diagnosis not present

## 2021-09-18 DIAGNOSIS — Z4800 Encounter for change or removal of nonsurgical wound dressing: Secondary | ICD-10-CM | POA: Diagnosis not present

## 2021-09-18 DIAGNOSIS — I5032 Chronic diastolic (congestive) heart failure: Secondary | ICD-10-CM | POA: Diagnosis not present

## 2021-09-18 DIAGNOSIS — I110 Hypertensive heart disease with heart failure: Secondary | ICD-10-CM | POA: Diagnosis not present

## 2021-09-18 DIAGNOSIS — I872 Venous insufficiency (chronic) (peripheral): Secondary | ICD-10-CM | POA: Diagnosis not present

## 2021-09-18 DIAGNOSIS — L97818 Non-pressure chronic ulcer of other part of right lower leg with other specified severity: Secondary | ICD-10-CM | POA: Diagnosis not present

## 2021-09-24 DIAGNOSIS — I872 Venous insufficiency (chronic) (peripheral): Secondary | ICD-10-CM | POA: Diagnosis not present

## 2021-09-24 DIAGNOSIS — I110 Hypertensive heart disease with heart failure: Secondary | ICD-10-CM | POA: Diagnosis not present

## 2021-09-24 DIAGNOSIS — E1151 Type 2 diabetes mellitus with diabetic peripheral angiopathy without gangrene: Secondary | ICD-10-CM | POA: Diagnosis not present

## 2021-09-24 DIAGNOSIS — Z4800 Encounter for change or removal of nonsurgical wound dressing: Secondary | ICD-10-CM | POA: Diagnosis not present

## 2021-09-24 DIAGNOSIS — L97818 Non-pressure chronic ulcer of other part of right lower leg with other specified severity: Secondary | ICD-10-CM | POA: Diagnosis not present

## 2021-09-24 DIAGNOSIS — I5032 Chronic diastolic (congestive) heart failure: Secondary | ICD-10-CM | POA: Diagnosis not present

## 2021-09-26 DIAGNOSIS — R600 Localized edema: Secondary | ICD-10-CM | POA: Diagnosis not present

## 2021-09-26 DIAGNOSIS — E782 Mixed hyperlipidemia: Secondary | ICD-10-CM | POA: Diagnosis not present

## 2021-09-26 DIAGNOSIS — E538 Deficiency of other specified B group vitamins: Secondary | ICD-10-CM | POA: Diagnosis not present

## 2021-09-26 DIAGNOSIS — L309 Dermatitis, unspecified: Secondary | ICD-10-CM | POA: Diagnosis not present

## 2021-09-26 DIAGNOSIS — E559 Vitamin D deficiency, unspecified: Secondary | ICD-10-CM | POA: Diagnosis not present

## 2021-09-26 DIAGNOSIS — S81801D Unspecified open wound, right lower leg, subsequent encounter: Secondary | ICD-10-CM | POA: Diagnosis not present

## 2021-09-26 DIAGNOSIS — I5032 Chronic diastolic (congestive) heart failure: Secondary | ICD-10-CM | POA: Diagnosis not present

## 2021-09-26 DIAGNOSIS — G4700 Insomnia, unspecified: Secondary | ICD-10-CM | POA: Diagnosis not present

## 2021-09-26 DIAGNOSIS — I110 Hypertensive heart disease with heart failure: Secondary | ICD-10-CM | POA: Diagnosis not present

## 2021-09-26 DIAGNOSIS — E119 Type 2 diabetes mellitus without complications: Secondary | ICD-10-CM | POA: Diagnosis not present

## 2021-09-26 DIAGNOSIS — N1831 Chronic kidney disease, stage 3a: Secondary | ICD-10-CM | POA: Diagnosis not present

## 2021-10-02 DIAGNOSIS — Z4800 Encounter for change or removal of nonsurgical wound dressing: Secondary | ICD-10-CM | POA: Diagnosis not present

## 2021-10-02 DIAGNOSIS — E1151 Type 2 diabetes mellitus with diabetic peripheral angiopathy without gangrene: Secondary | ICD-10-CM | POA: Diagnosis not present

## 2021-10-02 DIAGNOSIS — L97818 Non-pressure chronic ulcer of other part of right lower leg with other specified severity: Secondary | ICD-10-CM | POA: Diagnosis not present

## 2021-10-02 DIAGNOSIS — I110 Hypertensive heart disease with heart failure: Secondary | ICD-10-CM | POA: Diagnosis not present

## 2021-10-02 DIAGNOSIS — I5032 Chronic diastolic (congestive) heart failure: Secondary | ICD-10-CM | POA: Diagnosis not present

## 2021-10-02 DIAGNOSIS — I872 Venous insufficiency (chronic) (peripheral): Secondary | ICD-10-CM | POA: Diagnosis not present

## 2021-10-07 DIAGNOSIS — I5032 Chronic diastolic (congestive) heart failure: Secondary | ICD-10-CM | POA: Diagnosis not present

## 2021-10-07 DIAGNOSIS — I872 Venous insufficiency (chronic) (peripheral): Secondary | ICD-10-CM | POA: Diagnosis not present

## 2021-10-07 DIAGNOSIS — I110 Hypertensive heart disease with heart failure: Secondary | ICD-10-CM | POA: Diagnosis not present

## 2021-10-07 DIAGNOSIS — E1151 Type 2 diabetes mellitus with diabetic peripheral angiopathy without gangrene: Secondary | ICD-10-CM | POA: Diagnosis not present

## 2021-10-07 DIAGNOSIS — L97818 Non-pressure chronic ulcer of other part of right lower leg with other specified severity: Secondary | ICD-10-CM | POA: Diagnosis not present

## 2021-10-07 DIAGNOSIS — Z4800 Encounter for change or removal of nonsurgical wound dressing: Secondary | ICD-10-CM | POA: Diagnosis not present

## 2021-10-07 DIAGNOSIS — I10 Essential (primary) hypertension: Secondary | ICD-10-CM | POA: Diagnosis not present

## 2021-10-15 DIAGNOSIS — L0390 Cellulitis, unspecified: Secondary | ICD-10-CM | POA: Diagnosis not present

## 2021-10-15 DIAGNOSIS — Z79899 Other long term (current) drug therapy: Secondary | ICD-10-CM | POA: Diagnosis not present

## 2021-10-15 DIAGNOSIS — S81801D Unspecified open wound, right lower leg, subsequent encounter: Secondary | ICD-10-CM | POA: Diagnosis not present

## 2021-10-16 DIAGNOSIS — E782 Mixed hyperlipidemia: Secondary | ICD-10-CM | POA: Diagnosis not present

## 2021-10-16 DIAGNOSIS — D518 Other vitamin B12 deficiency anemias: Secondary | ICD-10-CM | POA: Diagnosis not present

## 2021-10-16 DIAGNOSIS — M1990 Unspecified osteoarthritis, unspecified site: Secondary | ICD-10-CM | POA: Diagnosis not present

## 2021-10-16 DIAGNOSIS — E038 Other specified hypothyroidism: Secondary | ICD-10-CM | POA: Diagnosis not present

## 2021-10-16 DIAGNOSIS — E785 Hyperlipidemia, unspecified: Secondary | ICD-10-CM | POA: Diagnosis not present

## 2021-10-16 DIAGNOSIS — I5032 Chronic diastolic (congestive) heart failure: Secondary | ICD-10-CM | POA: Diagnosis not present

## 2021-10-16 DIAGNOSIS — E1151 Type 2 diabetes mellitus with diabetic peripheral angiopathy without gangrene: Secondary | ICD-10-CM | POA: Diagnosis not present

## 2021-10-16 DIAGNOSIS — I509 Heart failure, unspecified: Secondary | ICD-10-CM | POA: Diagnosis not present

## 2021-10-16 DIAGNOSIS — I10 Essential (primary) hypertension: Secondary | ICD-10-CM | POA: Diagnosis not present

## 2021-10-16 DIAGNOSIS — E119 Type 2 diabetes mellitus without complications: Secondary | ICD-10-CM | POA: Diagnosis not present

## 2021-10-16 DIAGNOSIS — E559 Vitamin D deficiency, unspecified: Secondary | ICD-10-CM | POA: Diagnosis not present

## 2021-10-23 DIAGNOSIS — B351 Tinea unguium: Secondary | ICD-10-CM | POA: Diagnosis not present

## 2021-10-23 DIAGNOSIS — E1159 Type 2 diabetes mellitus with other circulatory complications: Secondary | ICD-10-CM | POA: Diagnosis not present

## 2021-10-30 DIAGNOSIS — Z7984 Long term (current) use of oral hypoglycemic drugs: Secondary | ICD-10-CM | POA: Diagnosis not present

## 2021-10-30 DIAGNOSIS — S81801A Unspecified open wound, right lower leg, initial encounter: Secondary | ICD-10-CM | POA: Diagnosis not present

## 2021-10-30 DIAGNOSIS — I739 Peripheral vascular disease, unspecified: Secondary | ICD-10-CM | POA: Diagnosis not present

## 2021-10-30 DIAGNOSIS — Z794 Long term (current) use of insulin: Secondary | ICD-10-CM | POA: Diagnosis not present

## 2021-10-30 DIAGNOSIS — Z79899 Other long term (current) drug therapy: Secondary | ICD-10-CM | POA: Diagnosis not present

## 2021-10-30 DIAGNOSIS — E559 Vitamin D deficiency, unspecified: Secondary | ICD-10-CM | POA: Diagnosis not present

## 2021-10-30 DIAGNOSIS — S81802A Unspecified open wound, left lower leg, initial encounter: Secondary | ICD-10-CM | POA: Diagnosis not present

## 2021-10-30 DIAGNOSIS — E7849 Other hyperlipidemia: Secondary | ICD-10-CM | POA: Diagnosis not present

## 2021-10-30 DIAGNOSIS — E119 Type 2 diabetes mellitus without complications: Secondary | ICD-10-CM | POA: Diagnosis not present

## 2021-10-30 DIAGNOSIS — I5032 Chronic diastolic (congestive) heart failure: Secondary | ICD-10-CM | POA: Diagnosis not present

## 2021-10-30 DIAGNOSIS — Z881 Allergy status to other antibiotic agents status: Secondary | ICD-10-CM | POA: Diagnosis not present

## 2021-10-30 DIAGNOSIS — I110 Hypertensive heart disease with heart failure: Secondary | ICD-10-CM | POA: Diagnosis not present

## 2021-10-30 DIAGNOSIS — Z87828 Personal history of other (healed) physical injury and trauma: Secondary | ICD-10-CM | POA: Diagnosis not present

## 2021-10-30 DIAGNOSIS — Z888 Allergy status to other drugs, medicaments and biological substances status: Secondary | ICD-10-CM | POA: Diagnosis not present

## 2021-10-30 DIAGNOSIS — D518 Other vitamin B12 deficiency anemias: Secondary | ICD-10-CM | POA: Diagnosis not present

## 2021-10-30 DIAGNOSIS — E785 Hyperlipidemia, unspecified: Secondary | ICD-10-CM | POA: Diagnosis not present

## 2021-10-31 DIAGNOSIS — I110 Hypertensive heart disease with heart failure: Secondary | ICD-10-CM | POA: Diagnosis not present

## 2021-10-31 DIAGNOSIS — E782 Mixed hyperlipidemia: Secondary | ICD-10-CM | POA: Diagnosis not present

## 2021-10-31 DIAGNOSIS — L309 Dermatitis, unspecified: Secondary | ICD-10-CM | POA: Diagnosis not present

## 2021-10-31 DIAGNOSIS — E559 Vitamin D deficiency, unspecified: Secondary | ICD-10-CM | POA: Diagnosis not present

## 2021-10-31 DIAGNOSIS — I5032 Chronic diastolic (congestive) heart failure: Secondary | ICD-10-CM | POA: Diagnosis not present

## 2021-10-31 DIAGNOSIS — L853 Xerosis cutis: Secondary | ICD-10-CM | POA: Diagnosis not present

## 2021-10-31 DIAGNOSIS — G4700 Insomnia, unspecified: Secondary | ICD-10-CM | POA: Diagnosis not present

## 2021-10-31 DIAGNOSIS — E119 Type 2 diabetes mellitus without complications: Secondary | ICD-10-CM | POA: Diagnosis not present

## 2021-10-31 DIAGNOSIS — M7989 Other specified soft tissue disorders: Secondary | ICD-10-CM | POA: Diagnosis not present

## 2021-11-07 DIAGNOSIS — E119 Type 2 diabetes mellitus without complications: Secondary | ICD-10-CM | POA: Diagnosis not present

## 2021-11-07 DIAGNOSIS — D518 Other vitamin B12 deficiency anemias: Secondary | ICD-10-CM | POA: Diagnosis not present

## 2021-11-07 DIAGNOSIS — Z79899 Other long term (current) drug therapy: Secondary | ICD-10-CM | POA: Diagnosis not present

## 2021-11-08 DIAGNOSIS — S81801A Unspecified open wound, right lower leg, initial encounter: Secondary | ICD-10-CM | POA: Diagnosis not present

## 2021-11-08 DIAGNOSIS — I10 Essential (primary) hypertension: Secondary | ICD-10-CM | POA: Diagnosis not present

## 2021-11-13 DIAGNOSIS — Z794 Long term (current) use of insulin: Secondary | ICD-10-CM | POA: Diagnosis not present

## 2021-11-13 DIAGNOSIS — Z881 Allergy status to other antibiotic agents status: Secondary | ICD-10-CM | POA: Diagnosis not present

## 2021-11-13 DIAGNOSIS — I5032 Chronic diastolic (congestive) heart failure: Secondary | ICD-10-CM | POA: Diagnosis not present

## 2021-11-13 DIAGNOSIS — I110 Hypertensive heart disease with heart failure: Secondary | ICD-10-CM | POA: Diagnosis not present

## 2021-11-13 DIAGNOSIS — Z7984 Long term (current) use of oral hypoglycemic drugs: Secondary | ICD-10-CM | POA: Diagnosis not present

## 2021-11-13 DIAGNOSIS — E119 Type 2 diabetes mellitus without complications: Secondary | ICD-10-CM | POA: Diagnosis not present

## 2021-11-13 DIAGNOSIS — Z888 Allergy status to other drugs, medicaments and biological substances status: Secondary | ICD-10-CM | POA: Diagnosis not present

## 2021-11-13 DIAGNOSIS — E785 Hyperlipidemia, unspecified: Secondary | ICD-10-CM | POA: Diagnosis not present

## 2021-11-13 DIAGNOSIS — Z87828 Personal history of other (healed) physical injury and trauma: Secondary | ICD-10-CM | POA: Diagnosis not present

## 2021-11-13 DIAGNOSIS — S81801A Unspecified open wound, right lower leg, initial encounter: Secondary | ICD-10-CM | POA: Diagnosis not present

## 2021-11-13 DIAGNOSIS — Z79899 Other long term (current) drug therapy: Secondary | ICD-10-CM | POA: Diagnosis not present

## 2021-11-22 DIAGNOSIS — D518 Other vitamin B12 deficiency anemias: Secondary | ICD-10-CM | POA: Diagnosis not present

## 2021-11-22 DIAGNOSIS — E782 Mixed hyperlipidemia: Secondary | ICD-10-CM | POA: Diagnosis not present

## 2021-11-22 DIAGNOSIS — E119 Type 2 diabetes mellitus without complications: Secondary | ICD-10-CM | POA: Diagnosis not present

## 2021-11-22 DIAGNOSIS — E559 Vitamin D deficiency, unspecified: Secondary | ICD-10-CM | POA: Diagnosis not present

## 2021-11-22 DIAGNOSIS — E038 Other specified hypothyroidism: Secondary | ICD-10-CM | POA: Diagnosis not present

## 2021-11-22 DIAGNOSIS — I5032 Chronic diastolic (congestive) heart failure: Secondary | ICD-10-CM | POA: Diagnosis not present

## 2021-11-22 DIAGNOSIS — I10 Essential (primary) hypertension: Secondary | ICD-10-CM | POA: Diagnosis not present

## 2021-11-22 DIAGNOSIS — M1990 Unspecified osteoarthritis, unspecified site: Secondary | ICD-10-CM | POA: Diagnosis not present

## 2021-11-22 DIAGNOSIS — E1151 Type 2 diabetes mellitus with diabetic peripheral angiopathy without gangrene: Secondary | ICD-10-CM | POA: Diagnosis not present

## 2021-11-26 DIAGNOSIS — M1990 Unspecified osteoarthritis, unspecified site: Secondary | ICD-10-CM | POA: Diagnosis not present

## 2021-11-26 DIAGNOSIS — G8929 Other chronic pain: Secondary | ICD-10-CM | POA: Diagnosis not present

## 2021-11-26 DIAGNOSIS — E119 Type 2 diabetes mellitus without complications: Secondary | ICD-10-CM | POA: Diagnosis not present

## 2021-11-26 DIAGNOSIS — N1831 Chronic kidney disease, stage 3a: Secondary | ICD-10-CM | POA: Diagnosis not present

## 2021-11-26 DIAGNOSIS — E538 Deficiency of other specified B group vitamins: Secondary | ICD-10-CM | POA: Diagnosis not present

## 2021-11-26 DIAGNOSIS — I5032 Chronic diastolic (congestive) heart failure: Secondary | ICD-10-CM | POA: Diagnosis not present

## 2021-11-26 DIAGNOSIS — I10 Essential (primary) hypertension: Secondary | ICD-10-CM | POA: Diagnosis not present

## 2021-11-26 DIAGNOSIS — E559 Vitamin D deficiency, unspecified: Secondary | ICD-10-CM | POA: Diagnosis not present

## 2021-11-26 DIAGNOSIS — G4700 Insomnia, unspecified: Secondary | ICD-10-CM | POA: Diagnosis not present

## 2021-12-02 DIAGNOSIS — E1151 Type 2 diabetes mellitus with diabetic peripheral angiopathy without gangrene: Secondary | ICD-10-CM | POA: Diagnosis not present

## 2021-12-02 DIAGNOSIS — I110 Hypertensive heart disease with heart failure: Secondary | ICD-10-CM | POA: Diagnosis not present

## 2021-12-02 DIAGNOSIS — I872 Venous insufficiency (chronic) (peripheral): Secondary | ICD-10-CM | POA: Diagnosis not present

## 2021-12-02 DIAGNOSIS — Z4800 Encounter for change or removal of nonsurgical wound dressing: Secondary | ICD-10-CM | POA: Diagnosis not present

## 2021-12-02 DIAGNOSIS — E785 Hyperlipidemia, unspecified: Secondary | ICD-10-CM | POA: Diagnosis not present

## 2021-12-02 DIAGNOSIS — L97811 Non-pressure chronic ulcer of other part of right lower leg limited to breakdown of skin: Secondary | ICD-10-CM | POA: Diagnosis not present

## 2021-12-02 DIAGNOSIS — I5032 Chronic diastolic (congestive) heart failure: Secondary | ICD-10-CM | POA: Diagnosis not present

## 2021-12-02 DIAGNOSIS — I10 Essential (primary) hypertension: Secondary | ICD-10-CM | POA: Diagnosis not present

## 2021-12-02 DIAGNOSIS — I2510 Atherosclerotic heart disease of native coronary artery without angina pectoris: Secondary | ICD-10-CM | POA: Diagnosis not present

## 2021-12-04 DIAGNOSIS — Z79899 Other long term (current) drug therapy: Secondary | ICD-10-CM | POA: Diagnosis not present

## 2021-12-04 DIAGNOSIS — Z794 Long term (current) use of insulin: Secondary | ICD-10-CM | POA: Diagnosis not present

## 2021-12-04 DIAGNOSIS — Z888 Allergy status to other drugs, medicaments and biological substances status: Secondary | ICD-10-CM | POA: Diagnosis not present

## 2021-12-04 DIAGNOSIS — E119 Type 2 diabetes mellitus without complications: Secondary | ICD-10-CM | POA: Diagnosis not present

## 2021-12-04 DIAGNOSIS — Z7984 Long term (current) use of oral hypoglycemic drugs: Secondary | ICD-10-CM | POA: Diagnosis not present

## 2021-12-04 DIAGNOSIS — S81801A Unspecified open wound, right lower leg, initial encounter: Secondary | ICD-10-CM | POA: Diagnosis not present

## 2021-12-04 DIAGNOSIS — L97911 Non-pressure chronic ulcer of unspecified part of right lower leg limited to breakdown of skin: Secondary | ICD-10-CM | POA: Diagnosis not present

## 2021-12-04 DIAGNOSIS — E785 Hyperlipidemia, unspecified: Secondary | ICD-10-CM | POA: Diagnosis not present

## 2021-12-04 DIAGNOSIS — I5032 Chronic diastolic (congestive) heart failure: Secondary | ICD-10-CM | POA: Diagnosis not present

## 2021-12-04 DIAGNOSIS — Z881 Allergy status to other antibiotic agents status: Secondary | ICD-10-CM | POA: Diagnosis not present

## 2021-12-04 DIAGNOSIS — I110 Hypertensive heart disease with heart failure: Secondary | ICD-10-CM | POA: Diagnosis not present

## 2021-12-04 DIAGNOSIS — Z87828 Personal history of other (healed) physical injury and trauma: Secondary | ICD-10-CM | POA: Diagnosis not present

## 2021-12-06 DIAGNOSIS — I110 Hypertensive heart disease with heart failure: Secondary | ICD-10-CM | POA: Diagnosis not present

## 2021-12-06 DIAGNOSIS — L97818 Non-pressure chronic ulcer of other part of right lower leg with other specified severity: Secondary | ICD-10-CM | POA: Diagnosis not present

## 2021-12-06 DIAGNOSIS — Z4800 Encounter for change or removal of nonsurgical wound dressing: Secondary | ICD-10-CM | POA: Diagnosis not present

## 2021-12-06 DIAGNOSIS — E1151 Type 2 diabetes mellitus with diabetic peripheral angiopathy without gangrene: Secondary | ICD-10-CM | POA: Diagnosis not present

## 2021-12-06 DIAGNOSIS — I872 Venous insufficiency (chronic) (peripheral): Secondary | ICD-10-CM | POA: Diagnosis not present

## 2021-12-06 DIAGNOSIS — L97811 Non-pressure chronic ulcer of other part of right lower leg limited to breakdown of skin: Secondary | ICD-10-CM | POA: Diagnosis not present

## 2021-12-06 DIAGNOSIS — I5032 Chronic diastolic (congestive) heart failure: Secondary | ICD-10-CM | POA: Diagnosis not present

## 2021-12-08 DIAGNOSIS — I10 Essential (primary) hypertension: Secondary | ICD-10-CM | POA: Diagnosis not present

## 2021-12-10 DIAGNOSIS — Z4800 Encounter for change or removal of nonsurgical wound dressing: Secondary | ICD-10-CM | POA: Diagnosis not present

## 2021-12-10 DIAGNOSIS — I872 Venous insufficiency (chronic) (peripheral): Secondary | ICD-10-CM | POA: Diagnosis not present

## 2021-12-10 DIAGNOSIS — I110 Hypertensive heart disease with heart failure: Secondary | ICD-10-CM | POA: Diagnosis not present

## 2021-12-10 DIAGNOSIS — L97811 Non-pressure chronic ulcer of other part of right lower leg limited to breakdown of skin: Secondary | ICD-10-CM | POA: Diagnosis not present

## 2021-12-10 DIAGNOSIS — E1151 Type 2 diabetes mellitus with diabetic peripheral angiopathy without gangrene: Secondary | ICD-10-CM | POA: Diagnosis not present

## 2021-12-10 DIAGNOSIS — I5032 Chronic diastolic (congestive) heart failure: Secondary | ICD-10-CM | POA: Diagnosis not present

## 2021-12-13 DIAGNOSIS — I5032 Chronic diastolic (congestive) heart failure: Secondary | ICD-10-CM | POA: Diagnosis not present

## 2021-12-13 DIAGNOSIS — E1151 Type 2 diabetes mellitus with diabetic peripheral angiopathy without gangrene: Secondary | ICD-10-CM | POA: Diagnosis not present

## 2021-12-13 DIAGNOSIS — Z4800 Encounter for change or removal of nonsurgical wound dressing: Secondary | ICD-10-CM | POA: Diagnosis not present

## 2021-12-13 DIAGNOSIS — L97811 Non-pressure chronic ulcer of other part of right lower leg limited to breakdown of skin: Secondary | ICD-10-CM | POA: Diagnosis not present

## 2021-12-13 DIAGNOSIS — I110 Hypertensive heart disease with heart failure: Secondary | ICD-10-CM | POA: Diagnosis not present

## 2021-12-13 DIAGNOSIS — I872 Venous insufficiency (chronic) (peripheral): Secondary | ICD-10-CM | POA: Diagnosis not present

## 2021-12-16 DIAGNOSIS — I5032 Chronic diastolic (congestive) heart failure: Secondary | ICD-10-CM | POA: Diagnosis not present

## 2021-12-16 DIAGNOSIS — E1151 Type 2 diabetes mellitus with diabetic peripheral angiopathy without gangrene: Secondary | ICD-10-CM | POA: Diagnosis not present

## 2021-12-16 DIAGNOSIS — Z4800 Encounter for change or removal of nonsurgical wound dressing: Secondary | ICD-10-CM | POA: Diagnosis not present

## 2021-12-16 DIAGNOSIS — I110 Hypertensive heart disease with heart failure: Secondary | ICD-10-CM | POA: Diagnosis not present

## 2021-12-16 DIAGNOSIS — I872 Venous insufficiency (chronic) (peripheral): Secondary | ICD-10-CM | POA: Diagnosis not present

## 2021-12-16 DIAGNOSIS — L97811 Non-pressure chronic ulcer of other part of right lower leg limited to breakdown of skin: Secondary | ICD-10-CM | POA: Diagnosis not present

## 2021-12-18 DIAGNOSIS — Z7984 Long term (current) use of oral hypoglycemic drugs: Secondary | ICD-10-CM | POA: Diagnosis not present

## 2021-12-18 DIAGNOSIS — I110 Hypertensive heart disease with heart failure: Secondary | ICD-10-CM | POA: Diagnosis not present

## 2021-12-18 DIAGNOSIS — S81801A Unspecified open wound, right lower leg, initial encounter: Secondary | ICD-10-CM | POA: Diagnosis not present

## 2021-12-18 DIAGNOSIS — I5032 Chronic diastolic (congestive) heart failure: Secondary | ICD-10-CM | POA: Diagnosis not present

## 2021-12-18 DIAGNOSIS — Z79899 Other long term (current) drug therapy: Secondary | ICD-10-CM | POA: Diagnosis not present

## 2021-12-18 DIAGNOSIS — Z794 Long term (current) use of insulin: Secondary | ICD-10-CM | POA: Diagnosis not present

## 2021-12-18 DIAGNOSIS — E785 Hyperlipidemia, unspecified: Secondary | ICD-10-CM | POA: Diagnosis not present

## 2021-12-18 DIAGNOSIS — Z881 Allergy status to other antibiotic agents status: Secondary | ICD-10-CM | POA: Diagnosis not present

## 2021-12-18 DIAGNOSIS — L97911 Non-pressure chronic ulcer of unspecified part of right lower leg limited to breakdown of skin: Secondary | ICD-10-CM | POA: Diagnosis not present

## 2021-12-18 DIAGNOSIS — E119 Type 2 diabetes mellitus without complications: Secondary | ICD-10-CM | POA: Diagnosis not present

## 2021-12-18 DIAGNOSIS — Z888 Allergy status to other drugs, medicaments and biological substances status: Secondary | ICD-10-CM | POA: Diagnosis not present

## 2021-12-19 DIAGNOSIS — Z4800 Encounter for change or removal of nonsurgical wound dressing: Secondary | ICD-10-CM | POA: Diagnosis not present

## 2021-12-19 DIAGNOSIS — I872 Venous insufficiency (chronic) (peripheral): Secondary | ICD-10-CM | POA: Diagnosis not present

## 2021-12-19 DIAGNOSIS — L97811 Non-pressure chronic ulcer of other part of right lower leg limited to breakdown of skin: Secondary | ICD-10-CM | POA: Diagnosis not present

## 2021-12-19 DIAGNOSIS — E1151 Type 2 diabetes mellitus with diabetic peripheral angiopathy without gangrene: Secondary | ICD-10-CM | POA: Diagnosis not present

## 2021-12-19 DIAGNOSIS — I5032 Chronic diastolic (congestive) heart failure: Secondary | ICD-10-CM | POA: Diagnosis not present

## 2021-12-19 DIAGNOSIS — I110 Hypertensive heart disease with heart failure: Secondary | ICD-10-CM | POA: Diagnosis not present

## 2021-12-23 DIAGNOSIS — L97811 Non-pressure chronic ulcer of other part of right lower leg limited to breakdown of skin: Secondary | ICD-10-CM | POA: Diagnosis not present

## 2021-12-24 DIAGNOSIS — G8929 Other chronic pain: Secondary | ICD-10-CM | POA: Diagnosis not present

## 2021-12-24 DIAGNOSIS — E1151 Type 2 diabetes mellitus with diabetic peripheral angiopathy without gangrene: Secondary | ICD-10-CM | POA: Diagnosis not present

## 2021-12-24 DIAGNOSIS — E782 Mixed hyperlipidemia: Secondary | ICD-10-CM | POA: Diagnosis not present

## 2021-12-24 DIAGNOSIS — I872 Venous insufficiency (chronic) (peripheral): Secondary | ICD-10-CM | POA: Diagnosis not present

## 2021-12-24 DIAGNOSIS — E538 Deficiency of other specified B group vitamins: Secondary | ICD-10-CM | POA: Diagnosis not present

## 2021-12-24 DIAGNOSIS — Z4800 Encounter for change or removal of nonsurgical wound dressing: Secondary | ICD-10-CM | POA: Diagnosis not present

## 2021-12-24 DIAGNOSIS — G4700 Insomnia, unspecified: Secondary | ICD-10-CM | POA: Diagnosis not present

## 2021-12-24 DIAGNOSIS — N1831 Chronic kidney disease, stage 3a: Secondary | ICD-10-CM | POA: Diagnosis not present

## 2021-12-24 DIAGNOSIS — L97811 Non-pressure chronic ulcer of other part of right lower leg limited to breakdown of skin: Secondary | ICD-10-CM | POA: Diagnosis not present

## 2021-12-24 DIAGNOSIS — I110 Hypertensive heart disease with heart failure: Secondary | ICD-10-CM | POA: Diagnosis not present

## 2021-12-24 DIAGNOSIS — I5032 Chronic diastolic (congestive) heart failure: Secondary | ICD-10-CM | POA: Diagnosis not present

## 2021-12-24 DIAGNOSIS — E559 Vitamin D deficiency, unspecified: Secondary | ICD-10-CM | POA: Diagnosis not present

## 2021-12-26 DIAGNOSIS — Z4800 Encounter for change or removal of nonsurgical wound dressing: Secondary | ICD-10-CM | POA: Diagnosis not present

## 2021-12-26 DIAGNOSIS — E1151 Type 2 diabetes mellitus with diabetic peripheral angiopathy without gangrene: Secondary | ICD-10-CM | POA: Diagnosis not present

## 2021-12-26 DIAGNOSIS — I5032 Chronic diastolic (congestive) heart failure: Secondary | ICD-10-CM | POA: Diagnosis not present

## 2021-12-26 DIAGNOSIS — I110 Hypertensive heart disease with heart failure: Secondary | ICD-10-CM | POA: Diagnosis not present

## 2021-12-26 DIAGNOSIS — I872 Venous insufficiency (chronic) (peripheral): Secondary | ICD-10-CM | POA: Diagnosis not present

## 2021-12-26 DIAGNOSIS — L97811 Non-pressure chronic ulcer of other part of right lower leg limited to breakdown of skin: Secondary | ICD-10-CM | POA: Diagnosis not present

## 2021-12-30 ENCOUNTER — Other Ambulatory Visit: Payer: Self-pay | Admitting: *Deleted

## 2021-12-30 DIAGNOSIS — M79606 Pain in leg, unspecified: Secondary | ICD-10-CM

## 2021-12-30 DIAGNOSIS — I872 Venous insufficiency (chronic) (peripheral): Secondary | ICD-10-CM | POA: Diagnosis not present

## 2021-12-30 DIAGNOSIS — E1151 Type 2 diabetes mellitus with diabetic peripheral angiopathy without gangrene: Secondary | ICD-10-CM | POA: Diagnosis not present

## 2021-12-30 DIAGNOSIS — L97811 Non-pressure chronic ulcer of other part of right lower leg limited to breakdown of skin: Secondary | ICD-10-CM | POA: Diagnosis not present

## 2021-12-30 DIAGNOSIS — I5032 Chronic diastolic (congestive) heart failure: Secondary | ICD-10-CM | POA: Diagnosis not present

## 2021-12-30 DIAGNOSIS — I110 Hypertensive heart disease with heart failure: Secondary | ICD-10-CM | POA: Diagnosis not present

## 2021-12-30 DIAGNOSIS — Z4800 Encounter for change or removal of nonsurgical wound dressing: Secondary | ICD-10-CM | POA: Diagnosis not present

## 2022-01-02 DIAGNOSIS — Z4800 Encounter for change or removal of nonsurgical wound dressing: Secondary | ICD-10-CM | POA: Diagnosis not present

## 2022-01-02 DIAGNOSIS — I5032 Chronic diastolic (congestive) heart failure: Secondary | ICD-10-CM | POA: Diagnosis not present

## 2022-01-02 DIAGNOSIS — E1151 Type 2 diabetes mellitus with diabetic peripheral angiopathy without gangrene: Secondary | ICD-10-CM | POA: Diagnosis not present

## 2022-01-02 DIAGNOSIS — I872 Venous insufficiency (chronic) (peripheral): Secondary | ICD-10-CM | POA: Diagnosis not present

## 2022-01-02 DIAGNOSIS — L97811 Non-pressure chronic ulcer of other part of right lower leg limited to breakdown of skin: Secondary | ICD-10-CM | POA: Diagnosis not present

## 2022-01-02 DIAGNOSIS — I110 Hypertensive heart disease with heart failure: Secondary | ICD-10-CM | POA: Diagnosis not present

## 2022-01-06 DIAGNOSIS — E782 Mixed hyperlipidemia: Secondary | ICD-10-CM | POA: Diagnosis not present

## 2022-01-06 DIAGNOSIS — E119 Type 2 diabetes mellitus without complications: Secondary | ICD-10-CM | POA: Diagnosis not present

## 2022-01-06 DIAGNOSIS — E559 Vitamin D deficiency, unspecified: Secondary | ICD-10-CM | POA: Diagnosis not present

## 2022-01-06 DIAGNOSIS — I10 Essential (primary) hypertension: Secondary | ICD-10-CM | POA: Diagnosis not present

## 2022-01-06 DIAGNOSIS — D518 Other vitamin B12 deficiency anemias: Secondary | ICD-10-CM | POA: Diagnosis not present

## 2022-01-06 DIAGNOSIS — E038 Other specified hypothyroidism: Secondary | ICD-10-CM | POA: Diagnosis not present

## 2022-01-06 DIAGNOSIS — M1990 Unspecified osteoarthritis, unspecified site: Secondary | ICD-10-CM | POA: Diagnosis not present

## 2022-01-07 DIAGNOSIS — I872 Venous insufficiency (chronic) (peripheral): Secondary | ICD-10-CM | POA: Diagnosis not present

## 2022-01-07 DIAGNOSIS — L97811 Non-pressure chronic ulcer of other part of right lower leg limited to breakdown of skin: Secondary | ICD-10-CM | POA: Diagnosis not present

## 2022-01-07 DIAGNOSIS — I5032 Chronic diastolic (congestive) heart failure: Secondary | ICD-10-CM | POA: Diagnosis not present

## 2022-01-07 DIAGNOSIS — I110 Hypertensive heart disease with heart failure: Secondary | ICD-10-CM | POA: Diagnosis not present

## 2022-01-07 DIAGNOSIS — E1151 Type 2 diabetes mellitus with diabetic peripheral angiopathy without gangrene: Secondary | ICD-10-CM | POA: Diagnosis not present

## 2022-01-07 DIAGNOSIS — Z4800 Encounter for change or removal of nonsurgical wound dressing: Secondary | ICD-10-CM | POA: Diagnosis not present

## 2022-01-08 ENCOUNTER — Ambulatory Visit (HOSPITAL_COMMUNITY): Admission: RE | Admit: 2022-01-08 | Payer: Medicare Other | Source: Ambulatory Visit

## 2022-01-08 ENCOUNTER — Encounter: Payer: Self-pay | Admitting: Vascular Surgery

## 2022-01-08 ENCOUNTER — Ambulatory Visit: Payer: Medicare Other | Admitting: Vascular Surgery

## 2022-01-08 VITALS — BP 146/61 | HR 57 | Temp 98.7°F | Resp 16 | Ht 61.0 in | Wt 139.0 lb

## 2022-01-08 DIAGNOSIS — M79606 Pain in leg, unspecified: Secondary | ICD-10-CM | POA: Diagnosis not present

## 2022-01-08 DIAGNOSIS — E1165 Type 2 diabetes mellitus with hyperglycemia: Secondary | ICD-10-CM | POA: Diagnosis not present
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
## 2022-01-10 DIAGNOSIS — E1151 Type 2 diabetes mellitus with diabetic peripheral angiopathy without gangrene: Secondary | ICD-10-CM | POA: Diagnosis not present

## 2022-01-10 DIAGNOSIS — I110 Hypertensive heart disease with heart failure: Secondary | ICD-10-CM | POA: Diagnosis not present

## 2022-01-10 DIAGNOSIS — Z4800 Encounter for change or removal of nonsurgical wound dressing: Secondary | ICD-10-CM | POA: Diagnosis not present

## 2022-01-10 DIAGNOSIS — I5032 Chronic diastolic (congestive) heart failure: Secondary | ICD-10-CM | POA: Diagnosis not present

## 2022-01-10 DIAGNOSIS — I872 Venous insufficiency (chronic) (peripheral): Secondary | ICD-10-CM | POA: Diagnosis not present

## 2022-01-10 DIAGNOSIS — L97811 Non-pressure chronic ulcer of other part of right lower leg limited to breakdown of skin: Secondary | ICD-10-CM | POA: Diagnosis not present

## 2022-01-13 DIAGNOSIS — I5032 Chronic diastolic (congestive) heart failure: Secondary | ICD-10-CM | POA: Diagnosis not present

## 2022-01-13 DIAGNOSIS — L97811 Non-pressure chronic ulcer of other part of right lower leg limited to breakdown of skin: Secondary | ICD-10-CM | POA: Diagnosis not present

## 2022-01-13 DIAGNOSIS — I110 Hypertensive heart disease with heart failure: Secondary | ICD-10-CM | POA: Diagnosis not present

## 2022-01-13 DIAGNOSIS — E1151 Type 2 diabetes mellitus with diabetic peripheral angiopathy without gangrene: Secondary | ICD-10-CM | POA: Diagnosis not present

## 2022-01-13 DIAGNOSIS — Z4800 Encounter for change or removal of nonsurgical wound dressing: Secondary | ICD-10-CM | POA: Diagnosis not present

## 2022-01-13 DIAGNOSIS — I872 Venous insufficiency (chronic) (peripheral): Secondary | ICD-10-CM | POA: Diagnosis not present

## 2022-01-15 DIAGNOSIS — Z7984 Long term (current) use of oral hypoglycemic drugs: Secondary | ICD-10-CM | POA: Diagnosis not present

## 2022-01-15 DIAGNOSIS — E785 Hyperlipidemia, unspecified: Secondary | ICD-10-CM | POA: Diagnosis not present

## 2022-01-15 DIAGNOSIS — S81801A Unspecified open wound, right lower leg, initial encounter: Secondary | ICD-10-CM | POA: Diagnosis not present

## 2022-01-15 DIAGNOSIS — Z79899 Other long term (current) drug therapy: Secondary | ICD-10-CM | POA: Diagnosis not present

## 2022-01-15 DIAGNOSIS — I5032 Chronic diastolic (congestive) heart failure: Secondary | ICD-10-CM | POA: Diagnosis not present

## 2022-01-15 DIAGNOSIS — Z09 Encounter for follow-up examination after completed treatment for conditions other than malignant neoplasm: Secondary | ICD-10-CM | POA: Diagnosis not present

## 2022-01-15 DIAGNOSIS — S81801S Unspecified open wound, right lower leg, sequela: Secondary | ICD-10-CM | POA: Diagnosis not present

## 2022-01-15 DIAGNOSIS — Z794 Long term (current) use of insulin: Secondary | ICD-10-CM | POA: Diagnosis not present

## 2022-01-15 DIAGNOSIS — I110 Hypertensive heart disease with heart failure: Secondary | ICD-10-CM | POA: Diagnosis not present

## 2022-01-15 DIAGNOSIS — E119 Type 2 diabetes mellitus without complications: Secondary | ICD-10-CM | POA: Diagnosis not present

## 2022-01-16 DIAGNOSIS — I5032 Chronic diastolic (congestive) heart failure: Secondary | ICD-10-CM | POA: Diagnosis not present

## 2022-01-16 DIAGNOSIS — E1151 Type 2 diabetes mellitus with diabetic peripheral angiopathy without gangrene: Secondary | ICD-10-CM | POA: Diagnosis not present

## 2022-01-16 DIAGNOSIS — L97811 Non-pressure chronic ulcer of other part of right lower leg limited to breakdown of skin: Secondary | ICD-10-CM | POA: Diagnosis not present

## 2022-01-16 DIAGNOSIS — I872 Venous insufficiency (chronic) (peripheral): Secondary | ICD-10-CM | POA: Diagnosis not present

## 2022-01-16 DIAGNOSIS — Z4800 Encounter for change or removal of nonsurgical wound dressing: Secondary | ICD-10-CM | POA: Diagnosis not present

## 2022-01-16 DIAGNOSIS — I110 Hypertensive heart disease with heart failure: Secondary | ICD-10-CM | POA: Diagnosis not present

## 2022-01-21 DIAGNOSIS — I110 Hypertensive heart disease with heart failure: Secondary | ICD-10-CM | POA: Diagnosis not present

## 2022-01-21 DIAGNOSIS — L97811 Non-pressure chronic ulcer of other part of right lower leg limited to breakdown of skin: Secondary | ICD-10-CM | POA: Diagnosis not present

## 2022-01-21 DIAGNOSIS — I5032 Chronic diastolic (congestive) heart failure: Secondary | ICD-10-CM | POA: Diagnosis not present

## 2022-01-21 DIAGNOSIS — Z4800 Encounter for change or removal of nonsurgical wound dressing: Secondary | ICD-10-CM | POA: Diagnosis not present

## 2022-01-21 DIAGNOSIS — E1151 Type 2 diabetes mellitus with diabetic peripheral angiopathy without gangrene: Secondary | ICD-10-CM | POA: Diagnosis not present

## 2022-01-21 DIAGNOSIS — I872 Venous insufficiency (chronic) (peripheral): Secondary | ICD-10-CM | POA: Diagnosis not present

## 2022-01-23 DIAGNOSIS — L97811 Non-pressure chronic ulcer of other part of right lower leg limited to breakdown of skin: Secondary | ICD-10-CM | POA: Diagnosis not present

## 2022-01-24 DIAGNOSIS — E119 Type 2 diabetes mellitus without complications: Secondary | ICD-10-CM | POA: Diagnosis not present

## 2022-01-24 DIAGNOSIS — I5032 Chronic diastolic (congestive) heart failure: Secondary | ICD-10-CM | POA: Diagnosis not present

## 2022-01-24 DIAGNOSIS — Z4800 Encounter for change or removal of nonsurgical wound dressing: Secondary | ICD-10-CM | POA: Diagnosis not present

## 2022-01-24 DIAGNOSIS — E782 Mixed hyperlipidemia: Secondary | ICD-10-CM | POA: Diagnosis not present

## 2022-01-24 DIAGNOSIS — M1990 Unspecified osteoarthritis, unspecified site: Secondary | ICD-10-CM | POA: Diagnosis not present

## 2022-01-24 DIAGNOSIS — I872 Venous insufficiency (chronic) (peripheral): Secondary | ICD-10-CM | POA: Diagnosis not present

## 2022-01-24 DIAGNOSIS — D518 Other vitamin B12 deficiency anemias: Secondary | ICD-10-CM | POA: Diagnosis not present

## 2022-01-24 DIAGNOSIS — E038 Other specified hypothyroidism: Secondary | ICD-10-CM | POA: Diagnosis not present

## 2022-01-24 DIAGNOSIS — E1151 Type 2 diabetes mellitus with diabetic peripheral angiopathy without gangrene: Secondary | ICD-10-CM | POA: Diagnosis not present

## 2022-01-24 DIAGNOSIS — E559 Vitamin D deficiency, unspecified: Secondary | ICD-10-CM | POA: Diagnosis not present

## 2022-01-24 DIAGNOSIS — L97811 Non-pressure chronic ulcer of other part of right lower leg limited to breakdown of skin: Secondary | ICD-10-CM | POA: Diagnosis not present

## 2022-01-24 DIAGNOSIS — I110 Hypertensive heart disease with heart failure: Secondary | ICD-10-CM | POA: Diagnosis not present

## 2022-01-24 DIAGNOSIS — I10 Essential (primary) hypertension: Secondary | ICD-10-CM | POA: Diagnosis not present

## 2022-01-28 DIAGNOSIS — G8929 Other chronic pain: Secondary | ICD-10-CM | POA: Diagnosis not present

## 2022-01-28 DIAGNOSIS — I110 Hypertensive heart disease with heart failure: Secondary | ICD-10-CM | POA: Diagnosis not present

## 2022-01-28 DIAGNOSIS — E1151 Type 2 diabetes mellitus with diabetic peripheral angiopathy without gangrene: Secondary | ICD-10-CM | POA: Diagnosis not present

## 2022-01-28 DIAGNOSIS — I5032 Chronic diastolic (congestive) heart failure: Secondary | ICD-10-CM | POA: Diagnosis not present

## 2022-01-28 DIAGNOSIS — E559 Vitamin D deficiency, unspecified: Secondary | ICD-10-CM | POA: Diagnosis not present

## 2022-01-28 DIAGNOSIS — N1831 Chronic kidney disease, stage 3a: Secondary | ICD-10-CM | POA: Diagnosis not present

## 2022-01-28 DIAGNOSIS — E538 Deficiency of other specified B group vitamins: Secondary | ICD-10-CM | POA: Diagnosis not present

## 2022-01-28 DIAGNOSIS — E782 Mixed hyperlipidemia: Secondary | ICD-10-CM | POA: Diagnosis not present

## 2022-01-28 DIAGNOSIS — G4700 Insomnia, unspecified: Secondary | ICD-10-CM | POA: Diagnosis not present

## 2022-01-28 DIAGNOSIS — I872 Venous insufficiency (chronic) (peripheral): Secondary | ICD-10-CM | POA: Diagnosis not present

## 2022-01-28 DIAGNOSIS — L97811 Non-pressure chronic ulcer of other part of right lower leg limited to breakdown of skin: Secondary | ICD-10-CM | POA: Diagnosis not present

## 2022-01-28 DIAGNOSIS — Z4800 Encounter for change or removal of nonsurgical wound dressing: Secondary | ICD-10-CM | POA: Diagnosis not present

## 2022-01-28 DIAGNOSIS — M1990 Unspecified osteoarthritis, unspecified site: Secondary | ICD-10-CM | POA: Diagnosis not present

## 2022-01-28 DIAGNOSIS — E119 Type 2 diabetes mellitus without complications: Secondary | ICD-10-CM | POA: Diagnosis not present

## 2022-01-28 DIAGNOSIS — I10 Essential (primary) hypertension: Secondary | ICD-10-CM | POA: Diagnosis not present

## 2022-01-29 DIAGNOSIS — Z794 Long term (current) use of insulin: Secondary | ICD-10-CM | POA: Diagnosis not present

## 2022-01-29 DIAGNOSIS — E119 Type 2 diabetes mellitus without complications: Secondary | ICD-10-CM | POA: Diagnosis not present

## 2022-01-29 DIAGNOSIS — S81801A Unspecified open wound, right lower leg, initial encounter: Secondary | ICD-10-CM | POA: Diagnosis not present

## 2022-01-29 DIAGNOSIS — I5032 Chronic diastolic (congestive) heart failure: Secondary | ICD-10-CM | POA: Diagnosis not present

## 2022-01-29 DIAGNOSIS — S81801D Unspecified open wound, right lower leg, subsequent encounter: Secondary | ICD-10-CM | POA: Diagnosis not present

## 2022-01-29 DIAGNOSIS — I110 Hypertensive heart disease with heart failure: Secondary | ICD-10-CM | POA: Diagnosis not present

## 2022-01-29 DIAGNOSIS — Z7984 Long term (current) use of oral hypoglycemic drugs: Secondary | ICD-10-CM | POA: Diagnosis not present

## 2022-01-29 DIAGNOSIS — Z881 Allergy status to other antibiotic agents status: Secondary | ICD-10-CM | POA: Diagnosis not present

## 2022-01-29 DIAGNOSIS — E785 Hyperlipidemia, unspecified: Secondary | ICD-10-CM | POA: Diagnosis not present

## 2022-01-31 DIAGNOSIS — L97811 Non-pressure chronic ulcer of other part of right lower leg limited to breakdown of skin: Secondary | ICD-10-CM | POA: Diagnosis not present

## 2022-02-04 DIAGNOSIS — I110 Hypertensive heart disease with heart failure: Secondary | ICD-10-CM | POA: Diagnosis not present

## 2022-02-04 DIAGNOSIS — I872 Venous insufficiency (chronic) (peripheral): Secondary | ICD-10-CM | POA: Diagnosis not present

## 2022-02-04 DIAGNOSIS — L97811 Non-pressure chronic ulcer of other part of right lower leg limited to breakdown of skin: Secondary | ICD-10-CM | POA: Diagnosis not present

## 2022-02-04 DIAGNOSIS — Z4800 Encounter for change or removal of nonsurgical wound dressing: Secondary | ICD-10-CM | POA: Diagnosis not present

## 2022-02-04 DIAGNOSIS — I5032 Chronic diastolic (congestive) heart failure: Secondary | ICD-10-CM | POA: Diagnosis not present

## 2022-02-04 DIAGNOSIS — E1151 Type 2 diabetes mellitus with diabetic peripheral angiopathy without gangrene: Secondary | ICD-10-CM | POA: Diagnosis not present

## 2022-02-07 DIAGNOSIS — I872 Venous insufficiency (chronic) (peripheral): Secondary | ICD-10-CM | POA: Diagnosis not present

## 2022-02-07 DIAGNOSIS — I5032 Chronic diastolic (congestive) heart failure: Secondary | ICD-10-CM | POA: Diagnosis not present

## 2022-02-07 DIAGNOSIS — R011 Cardiac murmur, unspecified: Secondary | ICD-10-CM | POA: Diagnosis not present

## 2022-02-07 DIAGNOSIS — E1151 Type 2 diabetes mellitus with diabetic peripheral angiopathy without gangrene: Secondary | ICD-10-CM | POA: Diagnosis not present

## 2022-02-07 DIAGNOSIS — I110 Hypertensive heart disease with heart failure: Secondary | ICD-10-CM | POA: Diagnosis not present

## 2022-02-07 DIAGNOSIS — L97811 Non-pressure chronic ulcer of other part of right lower leg limited to breakdown of skin: Secondary | ICD-10-CM | POA: Diagnosis not present

## 2022-02-07 DIAGNOSIS — Z4800 Encounter for change or removal of nonsurgical wound dressing: Secondary | ICD-10-CM | POA: Diagnosis not present

## 2022-02-08 DIAGNOSIS — E1165 Type 2 diabetes mellitus with hyperglycemia: Secondary | ICD-10-CM | POA: Diagnosis not present

## 2022-02-10 DIAGNOSIS — I5032 Chronic diastolic (congestive) heart failure: Secondary | ICD-10-CM | POA: Diagnosis not present

## 2022-02-10 DIAGNOSIS — E1151 Type 2 diabetes mellitus with diabetic peripheral angiopathy without gangrene: Secondary | ICD-10-CM | POA: Diagnosis not present

## 2022-02-10 DIAGNOSIS — L97811 Non-pressure chronic ulcer of other part of right lower leg limited to breakdown of skin: Secondary | ICD-10-CM | POA: Diagnosis not present

## 2022-02-10 DIAGNOSIS — Z4800 Encounter for change or removal of nonsurgical wound dressing: Secondary | ICD-10-CM | POA: Diagnosis not present

## 2022-02-10 DIAGNOSIS — I110 Hypertensive heart disease with heart failure: Secondary | ICD-10-CM | POA: Diagnosis not present

## 2022-02-10 DIAGNOSIS — I872 Venous insufficiency (chronic) (peripheral): Secondary | ICD-10-CM | POA: Diagnosis not present

## 2022-02-13 DIAGNOSIS — I5032 Chronic diastolic (congestive) heart failure: Secondary | ICD-10-CM | POA: Diagnosis not present

## 2022-02-13 DIAGNOSIS — Z4800 Encounter for change or removal of nonsurgical wound dressing: Secondary | ICD-10-CM | POA: Diagnosis not present

## 2022-02-13 DIAGNOSIS — E1151 Type 2 diabetes mellitus with diabetic peripheral angiopathy without gangrene: Secondary | ICD-10-CM | POA: Diagnosis not present

## 2022-02-13 DIAGNOSIS — I110 Hypertensive heart disease with heart failure: Secondary | ICD-10-CM | POA: Diagnosis not present

## 2022-02-13 DIAGNOSIS — L97811 Non-pressure chronic ulcer of other part of right lower leg limited to breakdown of skin: Secondary | ICD-10-CM | POA: Diagnosis not present

## 2022-02-13 DIAGNOSIS — I872 Venous insufficiency (chronic) (peripheral): Secondary | ICD-10-CM | POA: Diagnosis not present

## 2022-02-18 DIAGNOSIS — I110 Hypertensive heart disease with heart failure: Secondary | ICD-10-CM | POA: Diagnosis not present

## 2022-02-18 DIAGNOSIS — E1151 Type 2 diabetes mellitus with diabetic peripheral angiopathy without gangrene: Secondary | ICD-10-CM | POA: Diagnosis not present

## 2022-02-18 DIAGNOSIS — R0989 Other specified symptoms and signs involving the circulatory and respiratory systems: Secondary | ICD-10-CM | POA: Diagnosis not present

## 2022-02-18 DIAGNOSIS — I5032 Chronic diastolic (congestive) heart failure: Secondary | ICD-10-CM | POA: Diagnosis not present

## 2022-02-18 DIAGNOSIS — I6523 Occlusion and stenosis of bilateral carotid arteries: Secondary | ICD-10-CM | POA: Diagnosis not present

## 2022-02-18 DIAGNOSIS — L97811 Non-pressure chronic ulcer of other part of right lower leg limited to breakdown of skin: Secondary | ICD-10-CM | POA: Diagnosis not present

## 2022-02-18 DIAGNOSIS — Z4800 Encounter for change or removal of nonsurgical wound dressing: Secondary | ICD-10-CM | POA: Diagnosis not present

## 2022-02-18 DIAGNOSIS — I872 Venous insufficiency (chronic) (peripheral): Secondary | ICD-10-CM | POA: Diagnosis not present

## 2022-02-19 DIAGNOSIS — Z881 Allergy status to other antibiotic agents status: Secondary | ICD-10-CM | POA: Diagnosis not present

## 2022-02-19 DIAGNOSIS — Z7984 Long term (current) use of oral hypoglycemic drugs: Secondary | ICD-10-CM | POA: Diagnosis not present

## 2022-02-19 DIAGNOSIS — I110 Hypertensive heart disease with heart failure: Secondary | ICD-10-CM | POA: Diagnosis not present

## 2022-02-19 DIAGNOSIS — S81801A Unspecified open wound, right lower leg, initial encounter: Secondary | ICD-10-CM | POA: Diagnosis not present

## 2022-02-19 DIAGNOSIS — S81801D Unspecified open wound, right lower leg, subsequent encounter: Secondary | ICD-10-CM | POA: Diagnosis not present

## 2022-02-19 DIAGNOSIS — I5032 Chronic diastolic (congestive) heart failure: Secondary | ICD-10-CM | POA: Diagnosis not present

## 2022-02-19 DIAGNOSIS — E119 Type 2 diabetes mellitus without complications: Secondary | ICD-10-CM | POA: Diagnosis not present

## 2022-02-19 DIAGNOSIS — E785 Hyperlipidemia, unspecified: Secondary | ICD-10-CM | POA: Diagnosis not present

## 2022-02-19 DIAGNOSIS — Z794 Long term (current) use of insulin: Secondary | ICD-10-CM | POA: Diagnosis not present

## 2022-02-21 DIAGNOSIS — I5032 Chronic diastolic (congestive) heart failure: Secondary | ICD-10-CM | POA: Diagnosis not present

## 2022-02-21 DIAGNOSIS — Z4800 Encounter for change or removal of nonsurgical wound dressing: Secondary | ICD-10-CM | POA: Diagnosis not present

## 2022-02-21 DIAGNOSIS — E1151 Type 2 diabetes mellitus with diabetic peripheral angiopathy without gangrene: Secondary | ICD-10-CM | POA: Diagnosis not present

## 2022-02-21 DIAGNOSIS — I110 Hypertensive heart disease with heart failure: Secondary | ICD-10-CM | POA: Diagnosis not present

## 2022-02-21 DIAGNOSIS — L97811 Non-pressure chronic ulcer of other part of right lower leg limited to breakdown of skin: Secondary | ICD-10-CM | POA: Diagnosis not present

## 2022-02-21 DIAGNOSIS — I872 Venous insufficiency (chronic) (peripheral): Secondary | ICD-10-CM | POA: Diagnosis not present

## 2022-02-24 DIAGNOSIS — I110 Hypertensive heart disease with heart failure: Secondary | ICD-10-CM | POA: Diagnosis not present

## 2022-02-24 DIAGNOSIS — Z4800 Encounter for change or removal of nonsurgical wound dressing: Secondary | ICD-10-CM | POA: Diagnosis not present

## 2022-02-24 DIAGNOSIS — I872 Venous insufficiency (chronic) (peripheral): Secondary | ICD-10-CM | POA: Diagnosis not present

## 2022-02-24 DIAGNOSIS — I5032 Chronic diastolic (congestive) heart failure: Secondary | ICD-10-CM | POA: Diagnosis not present

## 2022-02-24 DIAGNOSIS — E1151 Type 2 diabetes mellitus with diabetic peripheral angiopathy without gangrene: Secondary | ICD-10-CM | POA: Diagnosis not present

## 2022-02-24 DIAGNOSIS — L97811 Non-pressure chronic ulcer of other part of right lower leg limited to breakdown of skin: Secondary | ICD-10-CM | POA: Diagnosis not present

## 2022-02-27 DIAGNOSIS — I5032 Chronic diastolic (congestive) heart failure: Secondary | ICD-10-CM | POA: Diagnosis not present

## 2022-02-27 DIAGNOSIS — I110 Hypertensive heart disease with heart failure: Secondary | ICD-10-CM | POA: Diagnosis not present

## 2022-02-27 DIAGNOSIS — E1151 Type 2 diabetes mellitus with diabetic peripheral angiopathy without gangrene: Secondary | ICD-10-CM | POA: Diagnosis not present

## 2022-02-27 DIAGNOSIS — Z4800 Encounter for change or removal of nonsurgical wound dressing: Secondary | ICD-10-CM | POA: Diagnosis not present

## 2022-02-27 DIAGNOSIS — I872 Venous insufficiency (chronic) (peripheral): Secondary | ICD-10-CM | POA: Diagnosis not present

## 2022-02-27 DIAGNOSIS — L97811 Non-pressure chronic ulcer of other part of right lower leg limited to breakdown of skin: Secondary | ICD-10-CM | POA: Diagnosis not present

## 2022-03-04 DIAGNOSIS — E782 Mixed hyperlipidemia: Secondary | ICD-10-CM | POA: Diagnosis not present

## 2022-03-04 DIAGNOSIS — M1990 Unspecified osteoarthritis, unspecified site: Secondary | ICD-10-CM | POA: Diagnosis not present

## 2022-03-04 DIAGNOSIS — E119 Type 2 diabetes mellitus without complications: Secondary | ICD-10-CM | POA: Diagnosis not present

## 2022-03-04 DIAGNOSIS — E538 Deficiency of other specified B group vitamins: Secondary | ICD-10-CM | POA: Diagnosis not present

## 2022-03-04 DIAGNOSIS — G4700 Insomnia, unspecified: Secondary | ICD-10-CM | POA: Diagnosis not present

## 2022-03-04 DIAGNOSIS — I5032 Chronic diastolic (congestive) heart failure: Secondary | ICD-10-CM | POA: Diagnosis not present

## 2022-03-06 DIAGNOSIS — I110 Hypertensive heart disease with heart failure: Secondary | ICD-10-CM | POA: Diagnosis not present

## 2022-03-06 DIAGNOSIS — I5032 Chronic diastolic (congestive) heart failure: Secondary | ICD-10-CM | POA: Diagnosis not present

## 2022-03-06 DIAGNOSIS — L97811 Non-pressure chronic ulcer of other part of right lower leg limited to breakdown of skin: Secondary | ICD-10-CM | POA: Diagnosis not present

## 2022-03-06 DIAGNOSIS — Z4800 Encounter for change or removal of nonsurgical wound dressing: Secondary | ICD-10-CM | POA: Diagnosis not present

## 2022-03-06 DIAGNOSIS — I872 Venous insufficiency (chronic) (peripheral): Secondary | ICD-10-CM | POA: Diagnosis not present

## 2022-03-06 DIAGNOSIS — E1151 Type 2 diabetes mellitus with diabetic peripheral angiopathy without gangrene: Secondary | ICD-10-CM | POA: Diagnosis not present

## 2022-03-11 DIAGNOSIS — Z4800 Encounter for change or removal of nonsurgical wound dressing: Secondary | ICD-10-CM | POA: Diagnosis not present

## 2022-03-11 DIAGNOSIS — I110 Hypertensive heart disease with heart failure: Secondary | ICD-10-CM | POA: Diagnosis not present

## 2022-03-11 DIAGNOSIS — L97811 Non-pressure chronic ulcer of other part of right lower leg limited to breakdown of skin: Secondary | ICD-10-CM | POA: Diagnosis not present

## 2022-03-11 DIAGNOSIS — E1165 Type 2 diabetes mellitus with hyperglycemia: Secondary | ICD-10-CM | POA: Diagnosis not present

## 2022-03-11 DIAGNOSIS — I5032 Chronic diastolic (congestive) heart failure: Secondary | ICD-10-CM | POA: Diagnosis not present

## 2022-03-11 DIAGNOSIS — E1151 Type 2 diabetes mellitus with diabetic peripheral angiopathy without gangrene: Secondary | ICD-10-CM | POA: Diagnosis not present

## 2022-03-11 DIAGNOSIS — I872 Venous insufficiency (chronic) (peripheral): Secondary | ICD-10-CM | POA: Diagnosis not present

## 2022-03-12 DIAGNOSIS — Z7984 Long term (current) use of oral hypoglycemic drugs: Secondary | ICD-10-CM | POA: Diagnosis not present

## 2022-03-12 DIAGNOSIS — I70223 Atherosclerosis of native arteries of extremities with rest pain, bilateral legs: Secondary | ICD-10-CM | POA: Diagnosis not present

## 2022-03-12 DIAGNOSIS — Z79899 Other long term (current) drug therapy: Secondary | ICD-10-CM | POA: Diagnosis not present

## 2022-03-12 DIAGNOSIS — L97911 Non-pressure chronic ulcer of unspecified part of right lower leg limited to breakdown of skin: Secondary | ICD-10-CM | POA: Diagnosis not present

## 2022-03-12 DIAGNOSIS — Z794 Long term (current) use of insulin: Secondary | ICD-10-CM | POA: Diagnosis not present

## 2022-03-12 DIAGNOSIS — Z881 Allergy status to other antibiotic agents status: Secondary | ICD-10-CM | POA: Diagnosis not present

## 2022-03-12 DIAGNOSIS — I110 Hypertensive heart disease with heart failure: Secondary | ICD-10-CM | POA: Diagnosis not present

## 2022-03-12 DIAGNOSIS — E119 Type 2 diabetes mellitus without complications: Secondary | ICD-10-CM | POA: Diagnosis not present

## 2022-03-12 DIAGNOSIS — I5032 Chronic diastolic (congestive) heart failure: Secondary | ICD-10-CM | POA: Diagnosis not present

## 2022-03-12 DIAGNOSIS — S81801D Unspecified open wound, right lower leg, subsequent encounter: Secondary | ICD-10-CM | POA: Diagnosis not present

## 2022-03-13 DIAGNOSIS — E782 Mixed hyperlipidemia: Secondary | ICD-10-CM | POA: Diagnosis not present

## 2022-03-13 DIAGNOSIS — D518 Other vitamin B12 deficiency anemias: Secondary | ICD-10-CM | POA: Diagnosis not present

## 2022-03-13 DIAGNOSIS — E119 Type 2 diabetes mellitus without complications: Secondary | ICD-10-CM | POA: Diagnosis not present

## 2022-03-13 DIAGNOSIS — E559 Vitamin D deficiency, unspecified: Secondary | ICD-10-CM | POA: Diagnosis not present

## 2022-03-13 DIAGNOSIS — I10 Essential (primary) hypertension: Secondary | ICD-10-CM | POA: Diagnosis not present

## 2022-03-13 DIAGNOSIS — E038 Other specified hypothyroidism: Secondary | ICD-10-CM | POA: Diagnosis not present

## 2022-03-13 DIAGNOSIS — M1990 Unspecified osteoarthritis, unspecified site: Secondary | ICD-10-CM | POA: Diagnosis not present

## 2022-03-18 DIAGNOSIS — I77811 Abdominal aortic ectasia: Secondary | ICD-10-CM | POA: Diagnosis not present

## 2022-03-22 DIAGNOSIS — I110 Hypertensive heart disease with heart failure: Secondary | ICD-10-CM | POA: Diagnosis not present

## 2022-03-22 DIAGNOSIS — I5032 Chronic diastolic (congestive) heart failure: Secondary | ICD-10-CM | POA: Diagnosis not present

## 2022-03-22 DIAGNOSIS — L97811 Non-pressure chronic ulcer of other part of right lower leg limited to breakdown of skin: Secondary | ICD-10-CM | POA: Diagnosis not present

## 2022-03-22 DIAGNOSIS — E1151 Type 2 diabetes mellitus with diabetic peripheral angiopathy without gangrene: Secondary | ICD-10-CM | POA: Diagnosis not present

## 2022-03-22 DIAGNOSIS — I872 Venous insufficiency (chronic) (peripheral): Secondary | ICD-10-CM | POA: Diagnosis not present

## 2022-03-22 DIAGNOSIS — Z4800 Encounter for change or removal of nonsurgical wound dressing: Secondary | ICD-10-CM | POA: Diagnosis not present

## 2022-03-24 DIAGNOSIS — E782 Mixed hyperlipidemia: Secondary | ICD-10-CM | POA: Diagnosis not present

## 2022-03-24 DIAGNOSIS — Z79899 Other long term (current) drug therapy: Secondary | ICD-10-CM | POA: Diagnosis not present

## 2022-03-24 DIAGNOSIS — E559 Vitamin D deficiency, unspecified: Secondary | ICD-10-CM | POA: Diagnosis not present

## 2022-03-24 DIAGNOSIS — E038 Other specified hypothyroidism: Secondary | ICD-10-CM | POA: Diagnosis not present

## 2022-03-24 DIAGNOSIS — E119 Type 2 diabetes mellitus without complications: Secondary | ICD-10-CM | POA: Diagnosis not present

## 2022-03-24 DIAGNOSIS — D518 Other vitamin B12 deficiency anemias: Secondary | ICD-10-CM | POA: Diagnosis not present

## 2022-03-27 DIAGNOSIS — E1151 Type 2 diabetes mellitus with diabetic peripheral angiopathy without gangrene: Secondary | ICD-10-CM | POA: Diagnosis not present

## 2022-03-27 DIAGNOSIS — I110 Hypertensive heart disease with heart failure: Secondary | ICD-10-CM | POA: Diagnosis not present

## 2022-03-27 DIAGNOSIS — I5032 Chronic diastolic (congestive) heart failure: Secondary | ICD-10-CM | POA: Diagnosis not present

## 2022-03-27 DIAGNOSIS — Z4800 Encounter for change or removal of nonsurgical wound dressing: Secondary | ICD-10-CM | POA: Diagnosis not present

## 2022-03-27 DIAGNOSIS — I872 Venous insufficiency (chronic) (peripheral): Secondary | ICD-10-CM | POA: Diagnosis not present

## 2022-03-27 DIAGNOSIS — L97811 Non-pressure chronic ulcer of other part of right lower leg limited to breakdown of skin: Secondary | ICD-10-CM | POA: Diagnosis not present

## 2022-04-01 DIAGNOSIS — Z79899 Other long term (current) drug therapy: Secondary | ICD-10-CM | POA: Diagnosis not present

## 2022-04-01 DIAGNOSIS — I110 Hypertensive heart disease with heart failure: Secondary | ICD-10-CM | POA: Diagnosis not present

## 2022-04-01 DIAGNOSIS — I872 Venous insufficiency (chronic) (peripheral): Secondary | ICD-10-CM | POA: Diagnosis not present

## 2022-04-01 DIAGNOSIS — I5032 Chronic diastolic (congestive) heart failure: Secondary | ICD-10-CM | POA: Diagnosis not present

## 2022-04-01 DIAGNOSIS — E1151 Type 2 diabetes mellitus with diabetic peripheral angiopathy without gangrene: Secondary | ICD-10-CM | POA: Diagnosis not present

## 2022-04-01 DIAGNOSIS — L97811 Non-pressure chronic ulcer of other part of right lower leg limited to breakdown of skin: Secondary | ICD-10-CM | POA: Diagnosis not present

## 2022-04-01 DIAGNOSIS — Z4800 Encounter for change or removal of nonsurgical wound dressing: Secondary | ICD-10-CM | POA: Diagnosis not present

## 2022-04-04 DIAGNOSIS — I110 Hypertensive heart disease with heart failure: Secondary | ICD-10-CM | POA: Diagnosis not present

## 2022-04-04 DIAGNOSIS — E1151 Type 2 diabetes mellitus with diabetic peripheral angiopathy without gangrene: Secondary | ICD-10-CM | POA: Diagnosis not present

## 2022-04-04 DIAGNOSIS — I5032 Chronic diastolic (congestive) heart failure: Secondary | ICD-10-CM | POA: Diagnosis not present

## 2022-04-04 DIAGNOSIS — Z4800 Encounter for change or removal of nonsurgical wound dressing: Secondary | ICD-10-CM | POA: Diagnosis not present

## 2022-04-04 DIAGNOSIS — I872 Venous insufficiency (chronic) (peripheral): Secondary | ICD-10-CM | POA: Diagnosis not present

## 2022-04-04 DIAGNOSIS — L97811 Non-pressure chronic ulcer of other part of right lower leg limited to breakdown of skin: Secondary | ICD-10-CM | POA: Diagnosis not present

## 2022-04-07 DIAGNOSIS — Z4800 Encounter for change or removal of nonsurgical wound dressing: Secondary | ICD-10-CM | POA: Diagnosis not present

## 2022-04-07 DIAGNOSIS — E1151 Type 2 diabetes mellitus with diabetic peripheral angiopathy without gangrene: Secondary | ICD-10-CM | POA: Diagnosis not present

## 2022-04-07 DIAGNOSIS — I872 Venous insufficiency (chronic) (peripheral): Secondary | ICD-10-CM | POA: Diagnosis not present

## 2022-04-07 DIAGNOSIS — I110 Hypertensive heart disease with heart failure: Secondary | ICD-10-CM | POA: Diagnosis not present

## 2022-04-07 DIAGNOSIS — L97811 Non-pressure chronic ulcer of other part of right lower leg limited to breakdown of skin: Secondary | ICD-10-CM | POA: Diagnosis not present

## 2022-04-07 DIAGNOSIS — I5032 Chronic diastolic (congestive) heart failure: Secondary | ICD-10-CM | POA: Diagnosis not present

## 2022-04-09 DIAGNOSIS — S81801A Unspecified open wound, right lower leg, initial encounter: Secondary | ICD-10-CM | POA: Diagnosis not present

## 2022-04-09 DIAGNOSIS — E11622 Type 2 diabetes mellitus with other skin ulcer: Secondary | ICD-10-CM | POA: Diagnosis not present

## 2022-04-09 DIAGNOSIS — I5032 Chronic diastolic (congestive) heart failure: Secondary | ICD-10-CM | POA: Diagnosis not present

## 2022-04-09 DIAGNOSIS — Z794 Long term (current) use of insulin: Secondary | ICD-10-CM | POA: Diagnosis not present

## 2022-04-09 DIAGNOSIS — Z881 Allergy status to other antibiotic agents status: Secondary | ICD-10-CM | POA: Diagnosis not present

## 2022-04-09 DIAGNOSIS — S81801S Unspecified open wound, right lower leg, sequela: Secondary | ICD-10-CM | POA: Diagnosis not present

## 2022-04-09 DIAGNOSIS — L400 Psoriasis vulgaris: Secondary | ICD-10-CM | POA: Diagnosis not present

## 2022-04-09 DIAGNOSIS — Z888 Allergy status to other drugs, medicaments and biological substances status: Secondary | ICD-10-CM | POA: Diagnosis not present

## 2022-04-09 DIAGNOSIS — Z85038 Personal history of other malignant neoplasm of large intestine: Secondary | ICD-10-CM | POA: Diagnosis not present

## 2022-04-09 DIAGNOSIS — E559 Vitamin D deficiency, unspecified: Secondary | ICD-10-CM | POA: Diagnosis not present

## 2022-04-09 DIAGNOSIS — E785 Hyperlipidemia, unspecified: Secondary | ICD-10-CM | POA: Diagnosis not present

## 2022-04-09 DIAGNOSIS — I110 Hypertensive heart disease with heart failure: Secondary | ICD-10-CM | POA: Diagnosis not present

## 2022-04-09 DIAGNOSIS — L97911 Non-pressure chronic ulcer of unspecified part of right lower leg limited to breakdown of skin: Secondary | ICD-10-CM | POA: Diagnosis not present

## 2022-04-09 DIAGNOSIS — Z7984 Long term (current) use of oral hypoglycemic drugs: Secondary | ICD-10-CM | POA: Diagnosis not present

## 2022-04-09 DIAGNOSIS — Z79899 Other long term (current) drug therapy: Secondary | ICD-10-CM | POA: Diagnosis not present

## 2022-04-11 DIAGNOSIS — E1165 Type 2 diabetes mellitus with hyperglycemia: Secondary | ICD-10-CM | POA: Diagnosis not present

## 2022-04-12 DIAGNOSIS — D518 Other vitamin B12 deficiency anemias: Secondary | ICD-10-CM | POA: Diagnosis not present

## 2022-04-12 DIAGNOSIS — I10 Essential (primary) hypertension: Secondary | ICD-10-CM | POA: Diagnosis not present

## 2022-04-12 DIAGNOSIS — E782 Mixed hyperlipidemia: Secondary | ICD-10-CM | POA: Diagnosis not present

## 2022-04-12 DIAGNOSIS — E119 Type 2 diabetes mellitus without complications: Secondary | ICD-10-CM | POA: Diagnosis not present

## 2022-04-12 DIAGNOSIS — E038 Other specified hypothyroidism: Secondary | ICD-10-CM | POA: Diagnosis not present

## 2022-04-12 DIAGNOSIS — M1990 Unspecified osteoarthritis, unspecified site: Secondary | ICD-10-CM | POA: Diagnosis not present

## 2022-04-12 DIAGNOSIS — E559 Vitamin D deficiency, unspecified: Secondary | ICD-10-CM | POA: Diagnosis not present

## 2022-04-15 DIAGNOSIS — I10 Essential (primary) hypertension: Secondary | ICD-10-CM | POA: Diagnosis not present

## 2022-04-15 DIAGNOSIS — I5032 Chronic diastolic (congestive) heart failure: Secondary | ICD-10-CM | POA: Diagnosis not present

## 2022-04-15 DIAGNOSIS — E538 Deficiency of other specified B group vitamins: Secondary | ICD-10-CM | POA: Diagnosis not present

## 2022-04-15 DIAGNOSIS — G8929 Other chronic pain: Secondary | ICD-10-CM | POA: Diagnosis not present

## 2022-04-15 DIAGNOSIS — M1990 Unspecified osteoarthritis, unspecified site: Secondary | ICD-10-CM | POA: Diagnosis not present

## 2022-04-15 DIAGNOSIS — E559 Vitamin D deficiency, unspecified: Secondary | ICD-10-CM | POA: Diagnosis not present

## 2022-04-15 DIAGNOSIS — E782 Mixed hyperlipidemia: Secondary | ICD-10-CM | POA: Diagnosis not present

## 2022-04-15 DIAGNOSIS — E119 Type 2 diabetes mellitus without complications: Secondary | ICD-10-CM | POA: Diagnosis not present

## 2022-04-16 DIAGNOSIS — I5032 Chronic diastolic (congestive) heart failure: Secondary | ICD-10-CM | POA: Diagnosis not present

## 2022-04-16 DIAGNOSIS — E1151 Type 2 diabetes mellitus with diabetic peripheral angiopathy without gangrene: Secondary | ICD-10-CM | POA: Diagnosis not present

## 2022-04-16 DIAGNOSIS — L97811 Non-pressure chronic ulcer of other part of right lower leg limited to breakdown of skin: Secondary | ICD-10-CM | POA: Diagnosis not present

## 2022-04-16 DIAGNOSIS — Z4800 Encounter for change or removal of nonsurgical wound dressing: Secondary | ICD-10-CM | POA: Diagnosis not present

## 2022-04-16 DIAGNOSIS — I872 Venous insufficiency (chronic) (peripheral): Secondary | ICD-10-CM | POA: Diagnosis not present

## 2022-04-16 DIAGNOSIS — I110 Hypertensive heart disease with heart failure: Secondary | ICD-10-CM | POA: Diagnosis not present

## 2022-04-18 DIAGNOSIS — L97811 Non-pressure chronic ulcer of other part of right lower leg limited to breakdown of skin: Secondary | ICD-10-CM | POA: Diagnosis not present

## 2022-04-18 DIAGNOSIS — I872 Venous insufficiency (chronic) (peripheral): Secondary | ICD-10-CM | POA: Diagnosis not present

## 2022-04-18 DIAGNOSIS — I110 Hypertensive heart disease with heart failure: Secondary | ICD-10-CM | POA: Diagnosis not present

## 2022-04-18 DIAGNOSIS — Z4800 Encounter for change or removal of nonsurgical wound dressing: Secondary | ICD-10-CM | POA: Diagnosis not present

## 2022-04-18 DIAGNOSIS — I5032 Chronic diastolic (congestive) heart failure: Secondary | ICD-10-CM | POA: Diagnosis not present

## 2022-04-18 DIAGNOSIS — E1151 Type 2 diabetes mellitus with diabetic peripheral angiopathy without gangrene: Secondary | ICD-10-CM | POA: Diagnosis not present

## 2022-04-21 DIAGNOSIS — I872 Venous insufficiency (chronic) (peripheral): Secondary | ICD-10-CM | POA: Diagnosis not present

## 2022-04-21 DIAGNOSIS — Z4800 Encounter for change or removal of nonsurgical wound dressing: Secondary | ICD-10-CM | POA: Diagnosis not present

## 2022-04-21 DIAGNOSIS — L97811 Non-pressure chronic ulcer of other part of right lower leg limited to breakdown of skin: Secondary | ICD-10-CM | POA: Diagnosis not present

## 2022-04-21 DIAGNOSIS — I5032 Chronic diastolic (congestive) heart failure: Secondary | ICD-10-CM | POA: Diagnosis not present

## 2022-04-21 DIAGNOSIS — E1151 Type 2 diabetes mellitus with diabetic peripheral angiopathy without gangrene: Secondary | ICD-10-CM | POA: Diagnosis not present

## 2022-04-21 DIAGNOSIS — I110 Hypertensive heart disease with heart failure: Secondary | ICD-10-CM | POA: Diagnosis not present

## 2022-04-30 DIAGNOSIS — I5032 Chronic diastolic (congestive) heart failure: Secondary | ICD-10-CM | POA: Diagnosis not present

## 2022-04-30 DIAGNOSIS — S81801A Unspecified open wound, right lower leg, initial encounter: Secondary | ICD-10-CM | POA: Diagnosis not present

## 2022-04-30 DIAGNOSIS — E1151 Type 2 diabetes mellitus with diabetic peripheral angiopathy without gangrene: Secondary | ICD-10-CM | POA: Diagnosis not present

## 2022-04-30 DIAGNOSIS — Z794 Long term (current) use of insulin: Secondary | ICD-10-CM | POA: Diagnosis not present

## 2022-04-30 DIAGNOSIS — E11622 Type 2 diabetes mellitus with other skin ulcer: Secondary | ICD-10-CM | POA: Diagnosis not present

## 2022-04-30 DIAGNOSIS — I872 Venous insufficiency (chronic) (peripheral): Secondary | ICD-10-CM | POA: Diagnosis not present

## 2022-04-30 DIAGNOSIS — I110 Hypertensive heart disease with heart failure: Secondary | ICD-10-CM | POA: Diagnosis not present

## 2022-04-30 DIAGNOSIS — L97911 Non-pressure chronic ulcer of unspecified part of right lower leg limited to breakdown of skin: Secondary | ICD-10-CM | POA: Diagnosis not present

## 2022-04-30 DIAGNOSIS — Z881 Allergy status to other antibiotic agents status: Secondary | ICD-10-CM | POA: Diagnosis not present

## 2022-04-30 DIAGNOSIS — E785 Hyperlipidemia, unspecified: Secondary | ICD-10-CM | POA: Diagnosis not present

## 2022-04-30 DIAGNOSIS — Z7984 Long term (current) use of oral hypoglycemic drugs: Secondary | ICD-10-CM | POA: Diagnosis not present

## 2022-04-30 DIAGNOSIS — Z4800 Encounter for change or removal of nonsurgical wound dressing: Secondary | ICD-10-CM | POA: Diagnosis not present

## 2022-04-30 DIAGNOSIS — L97811 Non-pressure chronic ulcer of other part of right lower leg limited to breakdown of skin: Secondary | ICD-10-CM | POA: Diagnosis not present

## 2022-04-30 DIAGNOSIS — Z79899 Other long term (current) drug therapy: Secondary | ICD-10-CM | POA: Diagnosis not present

## 2022-05-02 DIAGNOSIS — Z4800 Encounter for change or removal of nonsurgical wound dressing: Secondary | ICD-10-CM | POA: Diagnosis not present

## 2022-05-02 DIAGNOSIS — I110 Hypertensive heart disease with heart failure: Secondary | ICD-10-CM | POA: Diagnosis not present

## 2022-05-02 DIAGNOSIS — E1151 Type 2 diabetes mellitus with diabetic peripheral angiopathy without gangrene: Secondary | ICD-10-CM | POA: Diagnosis not present

## 2022-05-02 DIAGNOSIS — I872 Venous insufficiency (chronic) (peripheral): Secondary | ICD-10-CM | POA: Diagnosis not present

## 2022-05-02 DIAGNOSIS — I5032 Chronic diastolic (congestive) heart failure: Secondary | ICD-10-CM | POA: Diagnosis not present

## 2022-05-02 DIAGNOSIS — L97811 Non-pressure chronic ulcer of other part of right lower leg limited to breakdown of skin: Secondary | ICD-10-CM | POA: Diagnosis not present

## 2022-05-05 DIAGNOSIS — Z4800 Encounter for change or removal of nonsurgical wound dressing: Secondary | ICD-10-CM | POA: Diagnosis not present

## 2022-05-05 DIAGNOSIS — I5032 Chronic diastolic (congestive) heart failure: Secondary | ICD-10-CM | POA: Diagnosis not present

## 2022-05-05 DIAGNOSIS — I872 Venous insufficiency (chronic) (peripheral): Secondary | ICD-10-CM | POA: Diagnosis not present

## 2022-05-05 DIAGNOSIS — I110 Hypertensive heart disease with heart failure: Secondary | ICD-10-CM | POA: Diagnosis not present

## 2022-05-05 DIAGNOSIS — E1151 Type 2 diabetes mellitus with diabetic peripheral angiopathy without gangrene: Secondary | ICD-10-CM | POA: Diagnosis not present

## 2022-05-05 DIAGNOSIS — L97811 Non-pressure chronic ulcer of other part of right lower leg limited to breakdown of skin: Secondary | ICD-10-CM | POA: Diagnosis not present

## 2022-05-08 DIAGNOSIS — L97811 Non-pressure chronic ulcer of other part of right lower leg limited to breakdown of skin: Secondary | ICD-10-CM | POA: Diagnosis not present

## 2022-05-08 DIAGNOSIS — Z4800 Encounter for change or removal of nonsurgical wound dressing: Secondary | ICD-10-CM | POA: Diagnosis not present

## 2022-05-08 DIAGNOSIS — I110 Hypertensive heart disease with heart failure: Secondary | ICD-10-CM | POA: Diagnosis not present

## 2022-05-08 DIAGNOSIS — E1151 Type 2 diabetes mellitus with diabetic peripheral angiopathy without gangrene: Secondary | ICD-10-CM | POA: Diagnosis not present

## 2022-05-08 DIAGNOSIS — I872 Venous insufficiency (chronic) (peripheral): Secondary | ICD-10-CM | POA: Diagnosis not present

## 2022-05-08 DIAGNOSIS — I5032 Chronic diastolic (congestive) heart failure: Secondary | ICD-10-CM | POA: Diagnosis not present

## 2022-05-11 DIAGNOSIS — E1165 Type 2 diabetes mellitus with hyperglycemia: Secondary | ICD-10-CM | POA: Diagnosis not present

## 2022-05-12 DIAGNOSIS — I10 Essential (primary) hypertension: Secondary | ICD-10-CM | POA: Diagnosis not present

## 2022-05-12 DIAGNOSIS — E782 Mixed hyperlipidemia: Secondary | ICD-10-CM | POA: Diagnosis not present

## 2022-05-12 DIAGNOSIS — M1990 Unspecified osteoarthritis, unspecified site: Secondary | ICD-10-CM | POA: Diagnosis not present

## 2022-05-12 DIAGNOSIS — E038 Other specified hypothyroidism: Secondary | ICD-10-CM | POA: Diagnosis not present

## 2022-05-12 DIAGNOSIS — E119 Type 2 diabetes mellitus without complications: Secondary | ICD-10-CM | POA: Diagnosis not present

## 2022-05-12 DIAGNOSIS — D518 Other vitamin B12 deficiency anemias: Secondary | ICD-10-CM | POA: Diagnosis not present

## 2022-05-12 DIAGNOSIS — E559 Vitamin D deficiency, unspecified: Secondary | ICD-10-CM | POA: Diagnosis not present

## 2022-05-13 DIAGNOSIS — I872 Venous insufficiency (chronic) (peripheral): Secondary | ICD-10-CM | POA: Diagnosis not present

## 2022-05-13 DIAGNOSIS — I5032 Chronic diastolic (congestive) heart failure: Secondary | ICD-10-CM | POA: Diagnosis not present

## 2022-05-13 DIAGNOSIS — L97811 Non-pressure chronic ulcer of other part of right lower leg limited to breakdown of skin: Secondary | ICD-10-CM | POA: Diagnosis not present

## 2022-05-13 DIAGNOSIS — I110 Hypertensive heart disease with heart failure: Secondary | ICD-10-CM | POA: Diagnosis not present

## 2022-05-13 DIAGNOSIS — Z4800 Encounter for change or removal of nonsurgical wound dressing: Secondary | ICD-10-CM | POA: Diagnosis not present

## 2022-05-13 DIAGNOSIS — E1151 Type 2 diabetes mellitus with diabetic peripheral angiopathy without gangrene: Secondary | ICD-10-CM | POA: Diagnosis not present

## 2022-05-16 DIAGNOSIS — E1151 Type 2 diabetes mellitus with diabetic peripheral angiopathy without gangrene: Secondary | ICD-10-CM | POA: Diagnosis not present

## 2022-05-16 DIAGNOSIS — I5032 Chronic diastolic (congestive) heart failure: Secondary | ICD-10-CM | POA: Diagnosis not present

## 2022-05-16 DIAGNOSIS — I110 Hypertensive heart disease with heart failure: Secondary | ICD-10-CM | POA: Diagnosis not present

## 2022-05-16 DIAGNOSIS — I872 Venous insufficiency (chronic) (peripheral): Secondary | ICD-10-CM | POA: Diagnosis not present

## 2022-05-16 DIAGNOSIS — L97811 Non-pressure chronic ulcer of other part of right lower leg limited to breakdown of skin: Secondary | ICD-10-CM | POA: Diagnosis not present

## 2022-05-16 DIAGNOSIS — Z4800 Encounter for change or removal of nonsurgical wound dressing: Secondary | ICD-10-CM | POA: Diagnosis not present

## 2022-05-17 DIAGNOSIS — I509 Heart failure, unspecified: Secondary | ICD-10-CM | POA: Diagnosis not present

## 2022-05-17 DIAGNOSIS — E782 Mixed hyperlipidemia: Secondary | ICD-10-CM | POA: Diagnosis not present

## 2022-05-17 DIAGNOSIS — I10 Essential (primary) hypertension: Secondary | ICD-10-CM | POA: Diagnosis not present

## 2022-05-17 DIAGNOSIS — E038 Other specified hypothyroidism: Secondary | ICD-10-CM | POA: Diagnosis not present

## 2022-05-17 DIAGNOSIS — E559 Vitamin D deficiency, unspecified: Secondary | ICD-10-CM | POA: Diagnosis not present

## 2022-05-17 DIAGNOSIS — D518 Other vitamin B12 deficiency anemias: Secondary | ICD-10-CM | POA: Diagnosis not present

## 2022-05-17 DIAGNOSIS — E119 Type 2 diabetes mellitus without complications: Secondary | ICD-10-CM | POA: Diagnosis not present

## 2022-05-19 DIAGNOSIS — I110 Hypertensive heart disease with heart failure: Secondary | ICD-10-CM | POA: Diagnosis not present

## 2022-05-19 DIAGNOSIS — B351 Tinea unguium: Secondary | ICD-10-CM | POA: Diagnosis not present

## 2022-05-19 DIAGNOSIS — E1151 Type 2 diabetes mellitus with diabetic peripheral angiopathy without gangrene: Secondary | ICD-10-CM | POA: Diagnosis not present

## 2022-05-19 DIAGNOSIS — E1140 Type 2 diabetes mellitus with diabetic neuropathy, unspecified: Secondary | ICD-10-CM | POA: Diagnosis not present

## 2022-05-19 DIAGNOSIS — L600 Ingrowing nail: Secondary | ICD-10-CM | POA: Diagnosis not present

## 2022-05-19 DIAGNOSIS — I872 Venous insufficiency (chronic) (peripheral): Secondary | ICD-10-CM | POA: Diagnosis not present

## 2022-05-19 DIAGNOSIS — M79672 Pain in left foot: Secondary | ICD-10-CM | POA: Diagnosis not present

## 2022-05-19 DIAGNOSIS — I5032 Chronic diastolic (congestive) heart failure: Secondary | ICD-10-CM | POA: Diagnosis not present

## 2022-05-19 DIAGNOSIS — M79671 Pain in right foot: Secondary | ICD-10-CM | POA: Diagnosis not present

## 2022-05-19 DIAGNOSIS — Z4800 Encounter for change or removal of nonsurgical wound dressing: Secondary | ICD-10-CM | POA: Diagnosis not present

## 2022-05-19 DIAGNOSIS — L97811 Non-pressure chronic ulcer of other part of right lower leg limited to breakdown of skin: Secondary | ICD-10-CM | POA: Diagnosis not present

## 2022-05-21 DIAGNOSIS — Z79899 Other long term (current) drug therapy: Secondary | ICD-10-CM | POA: Diagnosis not present

## 2022-05-21 DIAGNOSIS — Z881 Allergy status to other antibiotic agents status: Secondary | ICD-10-CM | POA: Diagnosis not present

## 2022-05-21 DIAGNOSIS — E11622 Type 2 diabetes mellitus with other skin ulcer: Secondary | ICD-10-CM | POA: Diagnosis not present

## 2022-05-21 DIAGNOSIS — Z794 Long term (current) use of insulin: Secondary | ICD-10-CM | POA: Diagnosis not present

## 2022-05-21 DIAGNOSIS — I5032 Chronic diastolic (congestive) heart failure: Secondary | ICD-10-CM | POA: Diagnosis not present

## 2022-05-21 DIAGNOSIS — I110 Hypertensive heart disease with heart failure: Secondary | ICD-10-CM | POA: Diagnosis not present

## 2022-05-21 DIAGNOSIS — Z7984 Long term (current) use of oral hypoglycemic drugs: Secondary | ICD-10-CM | POA: Diagnosis not present

## 2022-05-21 DIAGNOSIS — E785 Hyperlipidemia, unspecified: Secondary | ICD-10-CM | POA: Diagnosis not present

## 2022-05-21 DIAGNOSIS — L97911 Non-pressure chronic ulcer of unspecified part of right lower leg limited to breakdown of skin: Secondary | ICD-10-CM | POA: Diagnosis not present

## 2022-05-22 DIAGNOSIS — I872 Venous insufficiency (chronic) (peripheral): Secondary | ICD-10-CM | POA: Diagnosis not present

## 2022-05-22 DIAGNOSIS — Z4800 Encounter for change or removal of nonsurgical wound dressing: Secondary | ICD-10-CM | POA: Diagnosis not present

## 2022-05-22 DIAGNOSIS — I5032 Chronic diastolic (congestive) heart failure: Secondary | ICD-10-CM | POA: Diagnosis not present

## 2022-05-22 DIAGNOSIS — I110 Hypertensive heart disease with heart failure: Secondary | ICD-10-CM | POA: Diagnosis not present

## 2022-05-22 DIAGNOSIS — L97811 Non-pressure chronic ulcer of other part of right lower leg limited to breakdown of skin: Secondary | ICD-10-CM | POA: Diagnosis not present

## 2022-05-22 DIAGNOSIS — E1151 Type 2 diabetes mellitus with diabetic peripheral angiopathy without gangrene: Secondary | ICD-10-CM | POA: Diagnosis not present

## 2022-05-27 DIAGNOSIS — I872 Venous insufficiency (chronic) (peripheral): Secondary | ICD-10-CM | POA: Diagnosis not present

## 2022-05-27 DIAGNOSIS — Z4800 Encounter for change or removal of nonsurgical wound dressing: Secondary | ICD-10-CM | POA: Diagnosis not present

## 2022-05-27 DIAGNOSIS — I110 Hypertensive heart disease with heart failure: Secondary | ICD-10-CM | POA: Diagnosis not present

## 2022-05-27 DIAGNOSIS — I5032 Chronic diastolic (congestive) heart failure: Secondary | ICD-10-CM | POA: Diagnosis not present

## 2022-05-27 DIAGNOSIS — L97811 Non-pressure chronic ulcer of other part of right lower leg limited to breakdown of skin: Secondary | ICD-10-CM | POA: Diagnosis not present

## 2022-05-27 DIAGNOSIS — E1151 Type 2 diabetes mellitus with diabetic peripheral angiopathy without gangrene: Secondary | ICD-10-CM | POA: Diagnosis not present

## 2022-05-29 DIAGNOSIS — I110 Hypertensive heart disease with heart failure: Secondary | ICD-10-CM | POA: Diagnosis not present

## 2022-05-29 DIAGNOSIS — Z4800 Encounter for change or removal of nonsurgical wound dressing: Secondary | ICD-10-CM | POA: Diagnosis not present

## 2022-05-29 DIAGNOSIS — I872 Venous insufficiency (chronic) (peripheral): Secondary | ICD-10-CM | POA: Diagnosis not present

## 2022-05-29 DIAGNOSIS — E1151 Type 2 diabetes mellitus with diabetic peripheral angiopathy without gangrene: Secondary | ICD-10-CM | POA: Diagnosis not present

## 2022-05-29 DIAGNOSIS — L97811 Non-pressure chronic ulcer of other part of right lower leg limited to breakdown of skin: Secondary | ICD-10-CM | POA: Diagnosis not present

## 2022-05-29 DIAGNOSIS — I5032 Chronic diastolic (congestive) heart failure: Secondary | ICD-10-CM | POA: Diagnosis not present

## 2022-06-03 DIAGNOSIS — E1151 Type 2 diabetes mellitus with diabetic peripheral angiopathy without gangrene: Secondary | ICD-10-CM | POA: Diagnosis not present

## 2022-06-03 DIAGNOSIS — E119 Type 2 diabetes mellitus without complications: Secondary | ICD-10-CM | POA: Diagnosis not present

## 2022-06-03 DIAGNOSIS — I872 Venous insufficiency (chronic) (peripheral): Secondary | ICD-10-CM | POA: Diagnosis not present

## 2022-06-03 DIAGNOSIS — I10 Essential (primary) hypertension: Secondary | ICD-10-CM | POA: Diagnosis not present

## 2022-06-03 DIAGNOSIS — M1990 Unspecified osteoarthritis, unspecified site: Secondary | ICD-10-CM | POA: Diagnosis not present

## 2022-06-03 DIAGNOSIS — Z4800 Encounter for change or removal of nonsurgical wound dressing: Secondary | ICD-10-CM | POA: Diagnosis not present

## 2022-06-03 DIAGNOSIS — E785 Hyperlipidemia, unspecified: Secondary | ICD-10-CM | POA: Diagnosis not present

## 2022-06-03 DIAGNOSIS — E559 Vitamin D deficiency, unspecified: Secondary | ICD-10-CM | POA: Diagnosis not present

## 2022-06-03 DIAGNOSIS — I5032 Chronic diastolic (congestive) heart failure: Secondary | ICD-10-CM | POA: Diagnosis not present

## 2022-06-03 DIAGNOSIS — L97811 Non-pressure chronic ulcer of other part of right lower leg limited to breakdown of skin: Secondary | ICD-10-CM | POA: Diagnosis not present

## 2022-06-03 DIAGNOSIS — I110 Hypertensive heart disease with heart failure: Secondary | ICD-10-CM | POA: Diagnosis not present

## 2022-06-03 DIAGNOSIS — E782 Mixed hyperlipidemia: Secondary | ICD-10-CM | POA: Diagnosis not present

## 2022-06-03 DIAGNOSIS — G4700 Insomnia, unspecified: Secondary | ICD-10-CM | POA: Diagnosis not present

## 2022-06-03 DIAGNOSIS — E538 Deficiency of other specified B group vitamins: Secondary | ICD-10-CM | POA: Diagnosis not present

## 2022-06-03 DIAGNOSIS — G8929 Other chronic pain: Secondary | ICD-10-CM | POA: Diagnosis not present

## 2022-06-06 DIAGNOSIS — I872 Venous insufficiency (chronic) (peripheral): Secondary | ICD-10-CM | POA: Diagnosis not present

## 2022-06-06 DIAGNOSIS — I5032 Chronic diastolic (congestive) heart failure: Secondary | ICD-10-CM | POA: Diagnosis not present

## 2022-06-06 DIAGNOSIS — I110 Hypertensive heart disease with heart failure: Secondary | ICD-10-CM | POA: Diagnosis not present

## 2022-06-06 DIAGNOSIS — L97811 Non-pressure chronic ulcer of other part of right lower leg limited to breakdown of skin: Secondary | ICD-10-CM | POA: Diagnosis not present

## 2022-06-06 DIAGNOSIS — E1151 Type 2 diabetes mellitus with diabetic peripheral angiopathy without gangrene: Secondary | ICD-10-CM | POA: Diagnosis not present

## 2022-06-06 DIAGNOSIS — Z4800 Encounter for change or removal of nonsurgical wound dressing: Secondary | ICD-10-CM | POA: Diagnosis not present

## 2022-06-11 DIAGNOSIS — I5032 Chronic diastolic (congestive) heart failure: Secondary | ICD-10-CM | POA: Diagnosis not present

## 2022-06-11 DIAGNOSIS — E1165 Type 2 diabetes mellitus with hyperglycemia: Secondary | ICD-10-CM | POA: Diagnosis not present

## 2022-06-11 DIAGNOSIS — Z4800 Encounter for change or removal of nonsurgical wound dressing: Secondary | ICD-10-CM | POA: Diagnosis not present

## 2022-06-11 DIAGNOSIS — L97811 Non-pressure chronic ulcer of other part of right lower leg limited to breakdown of skin: Secondary | ICD-10-CM | POA: Diagnosis not present

## 2022-06-11 DIAGNOSIS — E1151 Type 2 diabetes mellitus with diabetic peripheral angiopathy without gangrene: Secondary | ICD-10-CM | POA: Diagnosis not present

## 2022-06-11 DIAGNOSIS — I110 Hypertensive heart disease with heart failure: Secondary | ICD-10-CM | POA: Diagnosis not present

## 2022-06-11 DIAGNOSIS — I872 Venous insufficiency (chronic) (peripheral): Secondary | ICD-10-CM | POA: Diagnosis not present

## 2022-06-13 DIAGNOSIS — Z4800 Encounter for change or removal of nonsurgical wound dressing: Secondary | ICD-10-CM | POA: Diagnosis not present

## 2022-06-13 DIAGNOSIS — E1151 Type 2 diabetes mellitus with diabetic peripheral angiopathy without gangrene: Secondary | ICD-10-CM | POA: Diagnosis not present

## 2022-06-13 DIAGNOSIS — I5032 Chronic diastolic (congestive) heart failure: Secondary | ICD-10-CM | POA: Diagnosis not present

## 2022-06-13 DIAGNOSIS — I872 Venous insufficiency (chronic) (peripheral): Secondary | ICD-10-CM | POA: Diagnosis not present

## 2022-06-13 DIAGNOSIS — I110 Hypertensive heart disease with heart failure: Secondary | ICD-10-CM | POA: Diagnosis not present

## 2022-06-13 DIAGNOSIS — L97811 Non-pressure chronic ulcer of other part of right lower leg limited to breakdown of skin: Secondary | ICD-10-CM | POA: Diagnosis not present

## 2022-06-16 DIAGNOSIS — Z4800 Encounter for change or removal of nonsurgical wound dressing: Secondary | ICD-10-CM | POA: Diagnosis not present

## 2022-06-16 DIAGNOSIS — I110 Hypertensive heart disease with heart failure: Secondary | ICD-10-CM | POA: Diagnosis not present

## 2022-06-16 DIAGNOSIS — L97811 Non-pressure chronic ulcer of other part of right lower leg limited to breakdown of skin: Secondary | ICD-10-CM | POA: Diagnosis not present

## 2022-06-16 DIAGNOSIS — E1151 Type 2 diabetes mellitus with diabetic peripheral angiopathy without gangrene: Secondary | ICD-10-CM | POA: Diagnosis not present

## 2022-06-16 DIAGNOSIS — I5032 Chronic diastolic (congestive) heart failure: Secondary | ICD-10-CM | POA: Diagnosis not present

## 2022-06-16 DIAGNOSIS — I872 Venous insufficiency (chronic) (peripheral): Secondary | ICD-10-CM | POA: Diagnosis not present

## 2022-06-18 DIAGNOSIS — I5032 Chronic diastolic (congestive) heart failure: Secondary | ICD-10-CM | POA: Diagnosis not present

## 2022-06-18 DIAGNOSIS — Z79899 Other long term (current) drug therapy: Secondary | ICD-10-CM | POA: Diagnosis not present

## 2022-06-18 DIAGNOSIS — E11622 Type 2 diabetes mellitus with other skin ulcer: Secondary | ICD-10-CM | POA: Diagnosis not present

## 2022-06-18 DIAGNOSIS — Z7984 Long term (current) use of oral hypoglycemic drugs: Secondary | ICD-10-CM | POA: Diagnosis not present

## 2022-06-18 DIAGNOSIS — I110 Hypertensive heart disease with heart failure: Secondary | ICD-10-CM | POA: Diagnosis not present

## 2022-06-18 DIAGNOSIS — S81801S Unspecified open wound, right lower leg, sequela: Secondary | ICD-10-CM | POA: Diagnosis not present

## 2022-06-18 DIAGNOSIS — Z87828 Personal history of other (healed) physical injury and trauma: Secondary | ICD-10-CM | POA: Diagnosis not present

## 2022-06-18 DIAGNOSIS — Z794 Long term (current) use of insulin: Secondary | ICD-10-CM | POA: Diagnosis not present

## 2022-06-18 DIAGNOSIS — E785 Hyperlipidemia, unspecified: Secondary | ICD-10-CM | POA: Diagnosis not present

## 2022-06-18 DIAGNOSIS — L97911 Non-pressure chronic ulcer of unspecified part of right lower leg limited to breakdown of skin: Secondary | ICD-10-CM | POA: Diagnosis not present

## 2022-06-19 DIAGNOSIS — E1151 Type 2 diabetes mellitus with diabetic peripheral angiopathy without gangrene: Secondary | ICD-10-CM | POA: Diagnosis not present

## 2022-06-19 DIAGNOSIS — I5032 Chronic diastolic (congestive) heart failure: Secondary | ICD-10-CM | POA: Diagnosis not present

## 2022-06-19 DIAGNOSIS — Z4800 Encounter for change or removal of nonsurgical wound dressing: Secondary | ICD-10-CM | POA: Diagnosis not present

## 2022-06-19 DIAGNOSIS — I872 Venous insufficiency (chronic) (peripheral): Secondary | ICD-10-CM | POA: Diagnosis not present

## 2022-06-19 DIAGNOSIS — L97811 Non-pressure chronic ulcer of other part of right lower leg limited to breakdown of skin: Secondary | ICD-10-CM | POA: Diagnosis not present

## 2022-06-19 DIAGNOSIS — I110 Hypertensive heart disease with heart failure: Secondary | ICD-10-CM | POA: Diagnosis not present

## 2022-06-24 DIAGNOSIS — L97811 Non-pressure chronic ulcer of other part of right lower leg limited to breakdown of skin: Secondary | ICD-10-CM | POA: Diagnosis not present

## 2022-06-24 DIAGNOSIS — I872 Venous insufficiency (chronic) (peripheral): Secondary | ICD-10-CM | POA: Diagnosis not present

## 2022-06-24 DIAGNOSIS — Z4800 Encounter for change or removal of nonsurgical wound dressing: Secondary | ICD-10-CM | POA: Diagnosis not present

## 2022-06-24 DIAGNOSIS — I110 Hypertensive heart disease with heart failure: Secondary | ICD-10-CM | POA: Diagnosis not present

## 2022-06-24 DIAGNOSIS — I5032 Chronic diastolic (congestive) heart failure: Secondary | ICD-10-CM | POA: Diagnosis not present

## 2022-06-24 DIAGNOSIS — E1151 Type 2 diabetes mellitus with diabetic peripheral angiopathy without gangrene: Secondary | ICD-10-CM | POA: Diagnosis not present

## 2022-07-01 DIAGNOSIS — L97811 Non-pressure chronic ulcer of other part of right lower leg limited to breakdown of skin: Secondary | ICD-10-CM | POA: Diagnosis not present

## 2022-07-01 DIAGNOSIS — I872 Venous insufficiency (chronic) (peripheral): Secondary | ICD-10-CM | POA: Diagnosis not present

## 2022-07-01 DIAGNOSIS — I5032 Chronic diastolic (congestive) heart failure: Secondary | ICD-10-CM | POA: Diagnosis not present

## 2022-07-01 DIAGNOSIS — I110 Hypertensive heart disease with heart failure: Secondary | ICD-10-CM | POA: Diagnosis not present

## 2022-07-01 DIAGNOSIS — E1151 Type 2 diabetes mellitus with diabetic peripheral angiopathy without gangrene: Secondary | ICD-10-CM | POA: Diagnosis not present

## 2022-07-01 DIAGNOSIS — Z4800 Encounter for change or removal of nonsurgical wound dressing: Secondary | ICD-10-CM | POA: Diagnosis not present

## 2022-07-03 DIAGNOSIS — E782 Mixed hyperlipidemia: Secondary | ICD-10-CM | POA: Diagnosis not present

## 2022-07-03 DIAGNOSIS — D518 Other vitamin B12 deficiency anemias: Secondary | ICD-10-CM | POA: Diagnosis not present

## 2022-07-03 DIAGNOSIS — E038 Other specified hypothyroidism: Secondary | ICD-10-CM | POA: Diagnosis not present

## 2022-07-03 DIAGNOSIS — Z79899 Other long term (current) drug therapy: Secondary | ICD-10-CM | POA: Diagnosis not present

## 2022-07-03 DIAGNOSIS — E119 Type 2 diabetes mellitus without complications: Secondary | ICD-10-CM | POA: Diagnosis not present

## 2022-07-08 DIAGNOSIS — E559 Vitamin D deficiency, unspecified: Secondary | ICD-10-CM | POA: Diagnosis not present

## 2022-07-08 DIAGNOSIS — E119 Type 2 diabetes mellitus without complications: Secondary | ICD-10-CM | POA: Diagnosis not present

## 2022-07-08 DIAGNOSIS — D518 Other vitamin B12 deficiency anemias: Secondary | ICD-10-CM | POA: Diagnosis not present

## 2022-07-08 DIAGNOSIS — E038 Other specified hypothyroidism: Secondary | ICD-10-CM | POA: Diagnosis not present

## 2022-07-08 DIAGNOSIS — I5032 Chronic diastolic (congestive) heart failure: Secondary | ICD-10-CM | POA: Diagnosis not present

## 2022-07-08 DIAGNOSIS — E785 Hyperlipidemia, unspecified: Secondary | ICD-10-CM | POA: Diagnosis not present

## 2022-07-08 DIAGNOSIS — M1990 Unspecified osteoarthritis, unspecified site: Secondary | ICD-10-CM | POA: Diagnosis not present

## 2022-07-08 DIAGNOSIS — I10 Essential (primary) hypertension: Secondary | ICD-10-CM | POA: Diagnosis not present

## 2022-07-09 DIAGNOSIS — E1151 Type 2 diabetes mellitus with diabetic peripheral angiopathy without gangrene: Secondary | ICD-10-CM | POA: Diagnosis not present

## 2022-07-09 DIAGNOSIS — L97811 Non-pressure chronic ulcer of other part of right lower leg limited to breakdown of skin: Secondary | ICD-10-CM | POA: Diagnosis not present

## 2022-07-09 DIAGNOSIS — Z4800 Encounter for change or removal of nonsurgical wound dressing: Secondary | ICD-10-CM | POA: Diagnosis not present

## 2022-07-09 DIAGNOSIS — I5032 Chronic diastolic (congestive) heart failure: Secondary | ICD-10-CM | POA: Diagnosis not present

## 2022-07-09 DIAGNOSIS — I872 Venous insufficiency (chronic) (peripheral): Secondary | ICD-10-CM | POA: Diagnosis not present

## 2022-07-09 DIAGNOSIS — I110 Hypertensive heart disease with heart failure: Secondary | ICD-10-CM | POA: Diagnosis not present

## 2022-07-11 DIAGNOSIS — E1165 Type 2 diabetes mellitus with hyperglycemia: Secondary | ICD-10-CM | POA: Diagnosis not present

## 2022-07-15 DIAGNOSIS — N183 Chronic kidney disease, stage 3 unspecified: Secondary | ICD-10-CM | POA: Diagnosis not present

## 2022-07-15 DIAGNOSIS — I5032 Chronic diastolic (congestive) heart failure: Secondary | ICD-10-CM | POA: Diagnosis not present

## 2022-07-15 DIAGNOSIS — E782 Mixed hyperlipidemia: Secondary | ICD-10-CM | POA: Diagnosis not present

## 2022-07-15 DIAGNOSIS — G4700 Insomnia, unspecified: Secondary | ICD-10-CM | POA: Diagnosis not present

## 2022-07-15 DIAGNOSIS — E1140 Type 2 diabetes mellitus with diabetic neuropathy, unspecified: Secondary | ICD-10-CM | POA: Diagnosis not present

## 2022-07-15 DIAGNOSIS — I10 Essential (primary) hypertension: Secondary | ICD-10-CM | POA: Diagnosis not present

## 2022-07-16 DIAGNOSIS — E1151 Type 2 diabetes mellitus with diabetic peripheral angiopathy without gangrene: Secondary | ICD-10-CM | POA: Diagnosis not present

## 2022-07-16 DIAGNOSIS — L97811 Non-pressure chronic ulcer of other part of right lower leg limited to breakdown of skin: Secondary | ICD-10-CM | POA: Diagnosis not present

## 2022-07-16 DIAGNOSIS — E785 Hyperlipidemia, unspecified: Secondary | ICD-10-CM | POA: Diagnosis not present

## 2022-07-16 DIAGNOSIS — I872 Venous insufficiency (chronic) (peripheral): Secondary | ICD-10-CM | POA: Diagnosis not present

## 2022-07-16 DIAGNOSIS — Z794 Long term (current) use of insulin: Secondary | ICD-10-CM | POA: Diagnosis not present

## 2022-07-16 DIAGNOSIS — Z79899 Other long term (current) drug therapy: Secondary | ICD-10-CM | POA: Diagnosis not present

## 2022-07-16 DIAGNOSIS — I5032 Chronic diastolic (congestive) heart failure: Secondary | ICD-10-CM | POA: Diagnosis not present

## 2022-07-16 DIAGNOSIS — L97911 Non-pressure chronic ulcer of unspecified part of right lower leg limited to breakdown of skin: Secondary | ICD-10-CM | POA: Diagnosis not present

## 2022-07-16 DIAGNOSIS — E11622 Type 2 diabetes mellitus with other skin ulcer: Secondary | ICD-10-CM | POA: Diagnosis not present

## 2022-07-16 DIAGNOSIS — Z4800 Encounter for change or removal of nonsurgical wound dressing: Secondary | ICD-10-CM | POA: Diagnosis not present

## 2022-07-16 DIAGNOSIS — I110 Hypertensive heart disease with heart failure: Secondary | ICD-10-CM | POA: Diagnosis not present

## 2022-07-22 DIAGNOSIS — I872 Venous insufficiency (chronic) (peripheral): Secondary | ICD-10-CM | POA: Diagnosis not present

## 2022-07-22 DIAGNOSIS — I5032 Chronic diastolic (congestive) heart failure: Secondary | ICD-10-CM | POA: Diagnosis not present

## 2022-07-22 DIAGNOSIS — Z4800 Encounter for change or removal of nonsurgical wound dressing: Secondary | ICD-10-CM | POA: Diagnosis not present

## 2022-07-22 DIAGNOSIS — E1151 Type 2 diabetes mellitus with diabetic peripheral angiopathy without gangrene: Secondary | ICD-10-CM | POA: Diagnosis not present

## 2022-07-22 DIAGNOSIS — I110 Hypertensive heart disease with heart failure: Secondary | ICD-10-CM | POA: Diagnosis not present

## 2022-07-22 DIAGNOSIS — L97811 Non-pressure chronic ulcer of other part of right lower leg limited to breakdown of skin: Secondary | ICD-10-CM | POA: Diagnosis not present

## 2022-07-24 DIAGNOSIS — E782 Mixed hyperlipidemia: Secondary | ICD-10-CM | POA: Diagnosis not present

## 2022-07-24 DIAGNOSIS — E119 Type 2 diabetes mellitus without complications: Secondary | ICD-10-CM | POA: Diagnosis not present

## 2022-07-24 DIAGNOSIS — D518 Other vitamin B12 deficiency anemias: Secondary | ICD-10-CM | POA: Diagnosis not present

## 2022-07-24 DIAGNOSIS — Z79899 Other long term (current) drug therapy: Secondary | ICD-10-CM | POA: Diagnosis not present

## 2022-07-25 DIAGNOSIS — I110 Hypertensive heart disease with heart failure: Secondary | ICD-10-CM | POA: Diagnosis not present

## 2022-07-25 DIAGNOSIS — I5032 Chronic diastolic (congestive) heart failure: Secondary | ICD-10-CM | POA: Diagnosis not present

## 2022-07-25 DIAGNOSIS — Z4800 Encounter for change or removal of nonsurgical wound dressing: Secondary | ICD-10-CM | POA: Diagnosis not present

## 2022-07-25 DIAGNOSIS — I872 Venous insufficiency (chronic) (peripheral): Secondary | ICD-10-CM | POA: Diagnosis not present

## 2022-07-25 DIAGNOSIS — L97811 Non-pressure chronic ulcer of other part of right lower leg limited to breakdown of skin: Secondary | ICD-10-CM | POA: Diagnosis not present

## 2022-07-25 DIAGNOSIS — E1151 Type 2 diabetes mellitus with diabetic peripheral angiopathy without gangrene: Secondary | ICD-10-CM | POA: Diagnosis not present

## 2022-07-28 DIAGNOSIS — L97811 Non-pressure chronic ulcer of other part of right lower leg limited to breakdown of skin: Secondary | ICD-10-CM | POA: Diagnosis not present

## 2022-07-28 DIAGNOSIS — Z4800 Encounter for change or removal of nonsurgical wound dressing: Secondary | ICD-10-CM | POA: Diagnosis not present

## 2022-07-28 DIAGNOSIS — I110 Hypertensive heart disease with heart failure: Secondary | ICD-10-CM | POA: Diagnosis not present

## 2022-07-28 DIAGNOSIS — E1151 Type 2 diabetes mellitus with diabetic peripheral angiopathy without gangrene: Secondary | ICD-10-CM | POA: Diagnosis not present

## 2022-07-28 DIAGNOSIS — I872 Venous insufficiency (chronic) (peripheral): Secondary | ICD-10-CM | POA: Diagnosis not present

## 2022-07-28 DIAGNOSIS — I5032 Chronic diastolic (congestive) heart failure: Secondary | ICD-10-CM | POA: Diagnosis not present

## 2022-07-30 DIAGNOSIS — S81801A Unspecified open wound, right lower leg, initial encounter: Secondary | ICD-10-CM | POA: Diagnosis not present

## 2022-07-30 DIAGNOSIS — I5032 Chronic diastolic (congestive) heart failure: Secondary | ICD-10-CM | POA: Diagnosis not present

## 2022-07-30 DIAGNOSIS — Z87828 Personal history of other (healed) physical injury and trauma: Secondary | ICD-10-CM | POA: Diagnosis not present

## 2022-07-30 DIAGNOSIS — Z85038 Personal history of other malignant neoplasm of large intestine: Secondary | ICD-10-CM | POA: Diagnosis not present

## 2022-07-30 DIAGNOSIS — Z881 Allergy status to other antibiotic agents status: Secondary | ICD-10-CM | POA: Diagnosis not present

## 2022-07-30 DIAGNOSIS — S81801D Unspecified open wound, right lower leg, subsequent encounter: Secondary | ICD-10-CM | POA: Diagnosis not present

## 2022-07-30 DIAGNOSIS — Z7984 Long term (current) use of oral hypoglycemic drugs: Secondary | ICD-10-CM | POA: Diagnosis not present

## 2022-07-30 DIAGNOSIS — I110 Hypertensive heart disease with heart failure: Secondary | ICD-10-CM | POA: Diagnosis not present

## 2022-07-30 DIAGNOSIS — E119 Type 2 diabetes mellitus without complications: Secondary | ICD-10-CM | POA: Diagnosis not present

## 2022-07-30 DIAGNOSIS — R234 Changes in skin texture: Secondary | ICD-10-CM | POA: Diagnosis not present

## 2022-07-30 DIAGNOSIS — Z794 Long term (current) use of insulin: Secondary | ICD-10-CM | POA: Diagnosis not present

## 2022-07-30 DIAGNOSIS — Z79899 Other long term (current) drug therapy: Secondary | ICD-10-CM | POA: Diagnosis not present

## 2022-07-30 DIAGNOSIS — E785 Hyperlipidemia, unspecified: Secondary | ICD-10-CM | POA: Diagnosis not present

## 2022-07-31 DIAGNOSIS — I5032 Chronic diastolic (congestive) heart failure: Secondary | ICD-10-CM | POA: Diagnosis not present

## 2022-07-31 DIAGNOSIS — I110 Hypertensive heart disease with heart failure: Secondary | ICD-10-CM | POA: Diagnosis not present

## 2022-07-31 DIAGNOSIS — E1151 Type 2 diabetes mellitus with diabetic peripheral angiopathy without gangrene: Secondary | ICD-10-CM | POA: Diagnosis not present

## 2022-07-31 DIAGNOSIS — I872 Venous insufficiency (chronic) (peripheral): Secondary | ICD-10-CM | POA: Diagnosis not present

## 2022-07-31 DIAGNOSIS — Z4800 Encounter for change or removal of nonsurgical wound dressing: Secondary | ICD-10-CM | POA: Diagnosis not present

## 2022-07-31 DIAGNOSIS — L97811 Non-pressure chronic ulcer of other part of right lower leg limited to breakdown of skin: Secondary | ICD-10-CM | POA: Diagnosis not present

## 2022-08-04 DIAGNOSIS — D518 Other vitamin B12 deficiency anemias: Secondary | ICD-10-CM | POA: Diagnosis not present

## 2022-08-04 DIAGNOSIS — E559 Vitamin D deficiency, unspecified: Secondary | ICD-10-CM | POA: Diagnosis not present

## 2022-08-04 DIAGNOSIS — I10 Essential (primary) hypertension: Secondary | ICD-10-CM | POA: Diagnosis not present

## 2022-08-04 DIAGNOSIS — I5032 Chronic diastolic (congestive) heart failure: Secondary | ICD-10-CM | POA: Diagnosis not present

## 2022-08-04 DIAGNOSIS — M1990 Unspecified osteoarthritis, unspecified site: Secondary | ICD-10-CM | POA: Diagnosis not present

## 2022-08-04 DIAGNOSIS — E785 Hyperlipidemia, unspecified: Secondary | ICD-10-CM | POA: Diagnosis not present

## 2022-08-04 DIAGNOSIS — E038 Other specified hypothyroidism: Secondary | ICD-10-CM | POA: Diagnosis not present

## 2022-08-04 DIAGNOSIS — E119 Type 2 diabetes mellitus without complications: Secondary | ICD-10-CM | POA: Diagnosis not present

## 2022-08-05 DIAGNOSIS — L97811 Non-pressure chronic ulcer of other part of right lower leg limited to breakdown of skin: Secondary | ICD-10-CM | POA: Diagnosis not present

## 2022-08-05 DIAGNOSIS — I872 Venous insufficiency (chronic) (peripheral): Secondary | ICD-10-CM | POA: Diagnosis not present

## 2022-08-05 DIAGNOSIS — Z4800 Encounter for change or removal of nonsurgical wound dressing: Secondary | ICD-10-CM | POA: Diagnosis not present

## 2022-08-05 DIAGNOSIS — I110 Hypertensive heart disease with heart failure: Secondary | ICD-10-CM | POA: Diagnosis not present

## 2022-08-05 DIAGNOSIS — I5032 Chronic diastolic (congestive) heart failure: Secondary | ICD-10-CM | POA: Diagnosis not present

## 2022-08-05 DIAGNOSIS — E1151 Type 2 diabetes mellitus with diabetic peripheral angiopathy without gangrene: Secondary | ICD-10-CM | POA: Diagnosis not present

## 2022-08-08 DIAGNOSIS — I5032 Chronic diastolic (congestive) heart failure: Secondary | ICD-10-CM | POA: Diagnosis not present

## 2022-08-08 DIAGNOSIS — I110 Hypertensive heart disease with heart failure: Secondary | ICD-10-CM | POA: Diagnosis not present

## 2022-08-08 DIAGNOSIS — L97811 Non-pressure chronic ulcer of other part of right lower leg limited to breakdown of skin: Secondary | ICD-10-CM | POA: Diagnosis not present

## 2022-08-08 DIAGNOSIS — Z4800 Encounter for change or removal of nonsurgical wound dressing: Secondary | ICD-10-CM | POA: Diagnosis not present

## 2022-08-08 DIAGNOSIS — I872 Venous insufficiency (chronic) (peripheral): Secondary | ICD-10-CM | POA: Diagnosis not present

## 2022-08-08 DIAGNOSIS — E1151 Type 2 diabetes mellitus with diabetic peripheral angiopathy without gangrene: Secondary | ICD-10-CM | POA: Diagnosis not present

## 2022-08-11 DIAGNOSIS — I872 Venous insufficiency (chronic) (peripheral): Secondary | ICD-10-CM | POA: Diagnosis not present

## 2022-08-11 DIAGNOSIS — L97811 Non-pressure chronic ulcer of other part of right lower leg limited to breakdown of skin: Secondary | ICD-10-CM | POA: Diagnosis not present

## 2022-08-11 DIAGNOSIS — E1151 Type 2 diabetes mellitus with diabetic peripheral angiopathy without gangrene: Secondary | ICD-10-CM | POA: Diagnosis not present

## 2022-08-11 DIAGNOSIS — I5032 Chronic diastolic (congestive) heart failure: Secondary | ICD-10-CM | POA: Diagnosis not present

## 2022-08-11 DIAGNOSIS — E1165 Type 2 diabetes mellitus with hyperglycemia: Secondary | ICD-10-CM | POA: Diagnosis not present

## 2022-08-11 DIAGNOSIS — Z4800 Encounter for change or removal of nonsurgical wound dressing: Secondary | ICD-10-CM | POA: Diagnosis not present

## 2022-08-11 DIAGNOSIS — I110 Hypertensive heart disease with heart failure: Secondary | ICD-10-CM | POA: Diagnosis not present

## 2022-08-11 DIAGNOSIS — S81801A Unspecified open wound, right lower leg, initial encounter: Secondary | ICD-10-CM | POA: Diagnosis not present

## 2022-08-12 DIAGNOSIS — E559 Vitamin D deficiency, unspecified: Secondary | ICD-10-CM | POA: Diagnosis not present

## 2022-08-12 DIAGNOSIS — E119 Type 2 diabetes mellitus without complications: Secondary | ICD-10-CM | POA: Diagnosis not present

## 2022-08-12 DIAGNOSIS — E782 Mixed hyperlipidemia: Secondary | ICD-10-CM | POA: Diagnosis not present

## 2022-08-12 DIAGNOSIS — M1990 Unspecified osteoarthritis, unspecified site: Secondary | ICD-10-CM | POA: Diagnosis not present

## 2022-08-12 DIAGNOSIS — I5032 Chronic diastolic (congestive) heart failure: Secondary | ICD-10-CM | POA: Diagnosis not present

## 2022-08-12 DIAGNOSIS — E1140 Type 2 diabetes mellitus with diabetic neuropathy, unspecified: Secondary | ICD-10-CM | POA: Diagnosis not present

## 2022-08-12 DIAGNOSIS — I10 Essential (primary) hypertension: Secondary | ICD-10-CM | POA: Diagnosis not present

## 2022-08-12 DIAGNOSIS — G4700 Insomnia, unspecified: Secondary | ICD-10-CM | POA: Diagnosis not present

## 2022-08-13 DIAGNOSIS — Z7984 Long term (current) use of oral hypoglycemic drugs: Secondary | ICD-10-CM | POA: Diagnosis not present

## 2022-08-13 DIAGNOSIS — L97819 Non-pressure chronic ulcer of other part of right lower leg with unspecified severity: Secondary | ICD-10-CM | POA: Diagnosis not present

## 2022-08-13 DIAGNOSIS — Z79899 Other long term (current) drug therapy: Secondary | ICD-10-CM | POA: Diagnosis not present

## 2022-08-13 DIAGNOSIS — Z85038 Personal history of other malignant neoplasm of large intestine: Secondary | ICD-10-CM | POA: Diagnosis not present

## 2022-08-13 DIAGNOSIS — I5032 Chronic diastolic (congestive) heart failure: Secondary | ICD-10-CM | POA: Diagnosis not present

## 2022-08-13 DIAGNOSIS — S81801A Unspecified open wound, right lower leg, initial encounter: Secondary | ICD-10-CM | POA: Diagnosis not present

## 2022-08-13 DIAGNOSIS — R234 Changes in skin texture: Secondary | ICD-10-CM | POA: Diagnosis not present

## 2022-08-13 DIAGNOSIS — E785 Hyperlipidemia, unspecified: Secondary | ICD-10-CM | POA: Diagnosis not present

## 2022-08-13 DIAGNOSIS — Z881 Allergy status to other antibiotic agents status: Secondary | ICD-10-CM | POA: Diagnosis not present

## 2022-08-13 DIAGNOSIS — I110 Hypertensive heart disease with heart failure: Secondary | ICD-10-CM | POA: Diagnosis not present

## 2022-08-13 DIAGNOSIS — E119 Type 2 diabetes mellitus without complications: Secondary | ICD-10-CM | POA: Diagnosis not present

## 2022-08-13 DIAGNOSIS — Z794 Long term (current) use of insulin: Secondary | ICD-10-CM | POA: Diagnosis not present

## 2022-08-14 DIAGNOSIS — I110 Hypertensive heart disease with heart failure: Secondary | ICD-10-CM | POA: Diagnosis not present

## 2022-08-14 DIAGNOSIS — L97811 Non-pressure chronic ulcer of other part of right lower leg limited to breakdown of skin: Secondary | ICD-10-CM | POA: Diagnosis not present

## 2022-08-14 DIAGNOSIS — Z4800 Encounter for change or removal of nonsurgical wound dressing: Secondary | ICD-10-CM | POA: Diagnosis not present

## 2022-08-14 DIAGNOSIS — I5032 Chronic diastolic (congestive) heart failure: Secondary | ICD-10-CM | POA: Diagnosis not present

## 2022-08-14 DIAGNOSIS — E1151 Type 2 diabetes mellitus with diabetic peripheral angiopathy without gangrene: Secondary | ICD-10-CM | POA: Diagnosis not present

## 2022-08-14 DIAGNOSIS — I872 Venous insufficiency (chronic) (peripheral): Secondary | ICD-10-CM | POA: Diagnosis not present

## 2022-08-15 DIAGNOSIS — R509 Fever, unspecified: Secondary | ICD-10-CM | POA: Diagnosis not present

## 2022-08-19 DIAGNOSIS — Z4800 Encounter for change or removal of nonsurgical wound dressing: Secondary | ICD-10-CM | POA: Diagnosis not present

## 2022-08-19 DIAGNOSIS — I5032 Chronic diastolic (congestive) heart failure: Secondary | ICD-10-CM | POA: Diagnosis not present

## 2022-08-19 DIAGNOSIS — I110 Hypertensive heart disease with heart failure: Secondary | ICD-10-CM | POA: Diagnosis not present

## 2022-08-19 DIAGNOSIS — E1151 Type 2 diabetes mellitus with diabetic peripheral angiopathy without gangrene: Secondary | ICD-10-CM | POA: Diagnosis not present

## 2022-08-19 DIAGNOSIS — L97811 Non-pressure chronic ulcer of other part of right lower leg limited to breakdown of skin: Secondary | ICD-10-CM | POA: Diagnosis not present

## 2022-08-19 DIAGNOSIS — I872 Venous insufficiency (chronic) (peripheral): Secondary | ICD-10-CM | POA: Diagnosis not present

## 2022-08-22 DIAGNOSIS — I872 Venous insufficiency (chronic) (peripheral): Secondary | ICD-10-CM | POA: Diagnosis not present

## 2022-08-22 DIAGNOSIS — L97811 Non-pressure chronic ulcer of other part of right lower leg limited to breakdown of skin: Secondary | ICD-10-CM | POA: Diagnosis not present

## 2022-08-22 DIAGNOSIS — E1151 Type 2 diabetes mellitus with diabetic peripheral angiopathy without gangrene: Secondary | ICD-10-CM | POA: Diagnosis not present

## 2022-08-22 DIAGNOSIS — I110 Hypertensive heart disease with heart failure: Secondary | ICD-10-CM | POA: Diagnosis not present

## 2022-08-22 DIAGNOSIS — Z4800 Encounter for change or removal of nonsurgical wound dressing: Secondary | ICD-10-CM | POA: Diagnosis not present

## 2022-08-22 DIAGNOSIS — I5032 Chronic diastolic (congestive) heart failure: Secondary | ICD-10-CM | POA: Diagnosis not present

## 2022-08-25 DIAGNOSIS — Z4800 Encounter for change or removal of nonsurgical wound dressing: Secondary | ICD-10-CM | POA: Diagnosis not present

## 2022-08-25 DIAGNOSIS — L97811 Non-pressure chronic ulcer of other part of right lower leg limited to breakdown of skin: Secondary | ICD-10-CM | POA: Diagnosis not present

## 2022-08-25 DIAGNOSIS — E1151 Type 2 diabetes mellitus with diabetic peripheral angiopathy without gangrene: Secondary | ICD-10-CM | POA: Diagnosis not present

## 2022-08-25 DIAGNOSIS — I110 Hypertensive heart disease with heart failure: Secondary | ICD-10-CM | POA: Diagnosis not present

## 2022-08-25 DIAGNOSIS — I5032 Chronic diastolic (congestive) heart failure: Secondary | ICD-10-CM | POA: Diagnosis not present

## 2022-08-25 DIAGNOSIS — I872 Venous insufficiency (chronic) (peripheral): Secondary | ICD-10-CM | POA: Diagnosis not present

## 2022-08-26 DIAGNOSIS — E038 Other specified hypothyroidism: Secondary | ICD-10-CM | POA: Diagnosis not present

## 2022-08-26 DIAGNOSIS — E559 Vitamin D deficiency, unspecified: Secondary | ICD-10-CM | POA: Diagnosis not present

## 2022-08-26 DIAGNOSIS — E785 Hyperlipidemia, unspecified: Secondary | ICD-10-CM | POA: Diagnosis not present

## 2022-08-26 DIAGNOSIS — D518 Other vitamin B12 deficiency anemias: Secondary | ICD-10-CM | POA: Diagnosis not present

## 2022-08-26 DIAGNOSIS — E119 Type 2 diabetes mellitus without complications: Secondary | ICD-10-CM | POA: Diagnosis not present

## 2022-08-26 DIAGNOSIS — I10 Essential (primary) hypertension: Secondary | ICD-10-CM | POA: Diagnosis not present

## 2022-08-26 DIAGNOSIS — I5032 Chronic diastolic (congestive) heart failure: Secondary | ICD-10-CM | POA: Diagnosis not present

## 2022-08-28 DIAGNOSIS — E1151 Type 2 diabetes mellitus with diabetic peripheral angiopathy without gangrene: Secondary | ICD-10-CM | POA: Diagnosis not present

## 2022-08-28 DIAGNOSIS — I5032 Chronic diastolic (congestive) heart failure: Secondary | ICD-10-CM | POA: Diagnosis not present

## 2022-08-28 DIAGNOSIS — L97811 Non-pressure chronic ulcer of other part of right lower leg limited to breakdown of skin: Secondary | ICD-10-CM | POA: Diagnosis not present

## 2022-08-28 DIAGNOSIS — I872 Venous insufficiency (chronic) (peripheral): Secondary | ICD-10-CM | POA: Diagnosis not present

## 2022-08-28 DIAGNOSIS — I110 Hypertensive heart disease with heart failure: Secondary | ICD-10-CM | POA: Diagnosis not present

## 2022-08-28 DIAGNOSIS — Z4800 Encounter for change or removal of nonsurgical wound dressing: Secondary | ICD-10-CM | POA: Diagnosis not present

## 2022-09-01 DIAGNOSIS — I872 Venous insufficiency (chronic) (peripheral): Secondary | ICD-10-CM | POA: Diagnosis not present

## 2022-09-01 DIAGNOSIS — L97811 Non-pressure chronic ulcer of other part of right lower leg limited to breakdown of skin: Secondary | ICD-10-CM | POA: Diagnosis not present

## 2022-09-01 DIAGNOSIS — E1151 Type 2 diabetes mellitus with diabetic peripheral angiopathy without gangrene: Secondary | ICD-10-CM | POA: Diagnosis not present

## 2022-09-01 DIAGNOSIS — I5032 Chronic diastolic (congestive) heart failure: Secondary | ICD-10-CM | POA: Diagnosis not present

## 2022-09-01 DIAGNOSIS — I110 Hypertensive heart disease with heart failure: Secondary | ICD-10-CM | POA: Diagnosis not present

## 2022-09-01 DIAGNOSIS — Z4800 Encounter for change or removal of nonsurgical wound dressing: Secondary | ICD-10-CM | POA: Diagnosis not present

## 2022-09-03 DIAGNOSIS — Z794 Long term (current) use of insulin: Secondary | ICD-10-CM | POA: Diagnosis not present

## 2022-09-03 DIAGNOSIS — E785 Hyperlipidemia, unspecified: Secondary | ICD-10-CM | POA: Diagnosis not present

## 2022-09-03 DIAGNOSIS — I890 Lymphedema, not elsewhere classified: Secondary | ICD-10-CM | POA: Diagnosis not present

## 2022-09-03 DIAGNOSIS — Z7984 Long term (current) use of oral hypoglycemic drugs: Secondary | ICD-10-CM | POA: Diagnosis not present

## 2022-09-03 DIAGNOSIS — Z881 Allergy status to other antibiotic agents status: Secondary | ICD-10-CM | POA: Diagnosis not present

## 2022-09-03 DIAGNOSIS — Z79899 Other long term (current) drug therapy: Secondary | ICD-10-CM | POA: Diagnosis not present

## 2022-09-03 DIAGNOSIS — E119 Type 2 diabetes mellitus without complications: Secondary | ICD-10-CM | POA: Diagnosis not present

## 2022-09-03 DIAGNOSIS — I110 Hypertensive heart disease with heart failure: Secondary | ICD-10-CM | POA: Diagnosis not present

## 2022-09-03 DIAGNOSIS — I5032 Chronic diastolic (congestive) heart failure: Secondary | ICD-10-CM | POA: Diagnosis not present

## 2022-09-03 DIAGNOSIS — L97911 Non-pressure chronic ulcer of unspecified part of right lower leg limited to breakdown of skin: Secondary | ICD-10-CM | POA: Diagnosis not present

## 2022-09-04 DIAGNOSIS — L97811 Non-pressure chronic ulcer of other part of right lower leg limited to breakdown of skin: Secondary | ICD-10-CM | POA: Diagnosis not present

## 2022-09-04 DIAGNOSIS — E1151 Type 2 diabetes mellitus with diabetic peripheral angiopathy without gangrene: Secondary | ICD-10-CM | POA: Diagnosis not present

## 2022-09-04 DIAGNOSIS — I872 Venous insufficiency (chronic) (peripheral): Secondary | ICD-10-CM | POA: Diagnosis not present

## 2022-09-04 DIAGNOSIS — Z4800 Encounter for change or removal of nonsurgical wound dressing: Secondary | ICD-10-CM | POA: Diagnosis not present

## 2022-09-04 DIAGNOSIS — I110 Hypertensive heart disease with heart failure: Secondary | ICD-10-CM | POA: Diagnosis not present

## 2022-09-04 DIAGNOSIS — I5032 Chronic diastolic (congestive) heart failure: Secondary | ICD-10-CM | POA: Diagnosis not present

## 2022-09-08 DIAGNOSIS — I5032 Chronic diastolic (congestive) heart failure: Secondary | ICD-10-CM | POA: Diagnosis not present

## 2022-09-08 DIAGNOSIS — E1151 Type 2 diabetes mellitus with diabetic peripheral angiopathy without gangrene: Secondary | ICD-10-CM | POA: Diagnosis not present

## 2022-09-08 DIAGNOSIS — L97811 Non-pressure chronic ulcer of other part of right lower leg limited to breakdown of skin: Secondary | ICD-10-CM | POA: Diagnosis not present

## 2022-09-08 DIAGNOSIS — I110 Hypertensive heart disease with heart failure: Secondary | ICD-10-CM | POA: Diagnosis not present

## 2022-09-08 DIAGNOSIS — Z4800 Encounter for change or removal of nonsurgical wound dressing: Secondary | ICD-10-CM | POA: Diagnosis not present

## 2022-09-08 DIAGNOSIS — I872 Venous insufficiency (chronic) (peripheral): Secondary | ICD-10-CM | POA: Diagnosis not present

## 2022-09-09 DIAGNOSIS — N183 Chronic kidney disease, stage 3 unspecified: Secondary | ICD-10-CM | POA: Diagnosis not present

## 2022-09-09 DIAGNOSIS — I10 Essential (primary) hypertension: Secondary | ICD-10-CM | POA: Diagnosis not present

## 2022-09-09 DIAGNOSIS — I5032 Chronic diastolic (congestive) heart failure: Secondary | ICD-10-CM | POA: Diagnosis not present

## 2022-09-09 DIAGNOSIS — E1140 Type 2 diabetes mellitus with diabetic neuropathy, unspecified: Secondary | ICD-10-CM | POA: Diagnosis not present

## 2022-09-09 DIAGNOSIS — G8929 Other chronic pain: Secondary | ICD-10-CM | POA: Diagnosis not present

## 2022-09-09 DIAGNOSIS — E782 Mixed hyperlipidemia: Secondary | ICD-10-CM | POA: Diagnosis not present

## 2022-09-09 DIAGNOSIS — G4700 Insomnia, unspecified: Secondary | ICD-10-CM | POA: Diagnosis not present

## 2022-09-11 DIAGNOSIS — E1151 Type 2 diabetes mellitus with diabetic peripheral angiopathy without gangrene: Secondary | ICD-10-CM | POA: Diagnosis not present

## 2022-09-11 DIAGNOSIS — I5032 Chronic diastolic (congestive) heart failure: Secondary | ICD-10-CM | POA: Diagnosis not present

## 2022-09-11 DIAGNOSIS — L97811 Non-pressure chronic ulcer of other part of right lower leg limited to breakdown of skin: Secondary | ICD-10-CM | POA: Diagnosis not present

## 2022-09-11 DIAGNOSIS — Z4800 Encounter for change or removal of nonsurgical wound dressing: Secondary | ICD-10-CM | POA: Diagnosis not present

## 2022-09-11 DIAGNOSIS — E1165 Type 2 diabetes mellitus with hyperglycemia: Secondary | ICD-10-CM | POA: Diagnosis not present

## 2022-09-11 DIAGNOSIS — I110 Hypertensive heart disease with heart failure: Secondary | ICD-10-CM | POA: Diagnosis not present

## 2022-09-11 DIAGNOSIS — I872 Venous insufficiency (chronic) (peripheral): Secondary | ICD-10-CM | POA: Diagnosis not present

## 2022-09-16 DIAGNOSIS — E1151 Type 2 diabetes mellitus with diabetic peripheral angiopathy without gangrene: Secondary | ICD-10-CM | POA: Diagnosis not present

## 2022-09-16 DIAGNOSIS — I110 Hypertensive heart disease with heart failure: Secondary | ICD-10-CM | POA: Diagnosis not present

## 2022-09-16 DIAGNOSIS — L97811 Non-pressure chronic ulcer of other part of right lower leg limited to breakdown of skin: Secondary | ICD-10-CM | POA: Diagnosis not present

## 2022-09-16 DIAGNOSIS — I872 Venous insufficiency (chronic) (peripheral): Secondary | ICD-10-CM | POA: Diagnosis not present

## 2022-09-16 DIAGNOSIS — I5032 Chronic diastolic (congestive) heart failure: Secondary | ICD-10-CM | POA: Diagnosis not present

## 2022-09-16 DIAGNOSIS — Z4800 Encounter for change or removal of nonsurgical wound dressing: Secondary | ICD-10-CM | POA: Diagnosis not present

## 2022-09-18 DIAGNOSIS — E038 Other specified hypothyroidism: Secondary | ICD-10-CM | POA: Diagnosis not present

## 2022-09-18 DIAGNOSIS — E119 Type 2 diabetes mellitus without complications: Secondary | ICD-10-CM | POA: Diagnosis not present

## 2022-09-18 DIAGNOSIS — E782 Mixed hyperlipidemia: Secondary | ICD-10-CM | POA: Diagnosis not present

## 2022-09-18 DIAGNOSIS — D518 Other vitamin B12 deficiency anemias: Secondary | ICD-10-CM | POA: Diagnosis not present

## 2022-09-18 DIAGNOSIS — Z79899 Other long term (current) drug therapy: Secondary | ICD-10-CM | POA: Diagnosis not present

## 2022-09-19 DIAGNOSIS — Z4800 Encounter for change or removal of nonsurgical wound dressing: Secondary | ICD-10-CM | POA: Diagnosis not present

## 2022-09-19 DIAGNOSIS — I5032 Chronic diastolic (congestive) heart failure: Secondary | ICD-10-CM | POA: Diagnosis not present

## 2022-09-19 DIAGNOSIS — I110 Hypertensive heart disease with heart failure: Secondary | ICD-10-CM | POA: Diagnosis not present

## 2022-09-19 DIAGNOSIS — I872 Venous insufficiency (chronic) (peripheral): Secondary | ICD-10-CM | POA: Diagnosis not present

## 2022-09-19 DIAGNOSIS — E1151 Type 2 diabetes mellitus with diabetic peripheral angiopathy without gangrene: Secondary | ICD-10-CM | POA: Diagnosis not present

## 2022-09-19 DIAGNOSIS — L97811 Non-pressure chronic ulcer of other part of right lower leg limited to breakdown of skin: Secondary | ICD-10-CM | POA: Diagnosis not present

## 2022-09-22 DIAGNOSIS — Z4800 Encounter for change or removal of nonsurgical wound dressing: Secondary | ICD-10-CM | POA: Diagnosis not present

## 2022-09-22 DIAGNOSIS — I5032 Chronic diastolic (congestive) heart failure: Secondary | ICD-10-CM | POA: Diagnosis not present

## 2022-09-22 DIAGNOSIS — L97811 Non-pressure chronic ulcer of other part of right lower leg limited to breakdown of skin: Secondary | ICD-10-CM | POA: Diagnosis not present

## 2022-09-22 DIAGNOSIS — I872 Venous insufficiency (chronic) (peripheral): Secondary | ICD-10-CM | POA: Diagnosis not present

## 2022-09-22 DIAGNOSIS — E1151 Type 2 diabetes mellitus with diabetic peripheral angiopathy without gangrene: Secondary | ICD-10-CM | POA: Diagnosis not present

## 2022-09-22 DIAGNOSIS — I110 Hypertensive heart disease with heart failure: Secondary | ICD-10-CM | POA: Diagnosis not present

## 2022-09-24 DIAGNOSIS — I110 Hypertensive heart disease with heart failure: Secondary | ICD-10-CM | POA: Diagnosis not present

## 2022-09-24 DIAGNOSIS — Z79899 Other long term (current) drug therapy: Secondary | ICD-10-CM | POA: Diagnosis not present

## 2022-09-24 DIAGNOSIS — Z87828 Personal history of other (healed) physical injury and trauma: Secondary | ICD-10-CM | POA: Diagnosis not present

## 2022-09-24 DIAGNOSIS — I890 Lymphedema, not elsewhere classified: Secondary | ICD-10-CM | POA: Diagnosis not present

## 2022-09-24 DIAGNOSIS — L97911 Non-pressure chronic ulcer of unspecified part of right lower leg limited to breakdown of skin: Secondary | ICD-10-CM | POA: Diagnosis not present

## 2022-09-24 DIAGNOSIS — I5032 Chronic diastolic (congestive) heart failure: Secondary | ICD-10-CM | POA: Diagnosis not present

## 2022-09-24 DIAGNOSIS — Z7984 Long term (current) use of oral hypoglycemic drugs: Secondary | ICD-10-CM | POA: Diagnosis not present

## 2022-09-24 DIAGNOSIS — Z881 Allergy status to other antibiotic agents status: Secondary | ICD-10-CM | POA: Diagnosis not present

## 2022-09-24 DIAGNOSIS — S81801S Unspecified open wound, right lower leg, sequela: Secondary | ICD-10-CM | POA: Diagnosis not present

## 2022-09-24 DIAGNOSIS — E785 Hyperlipidemia, unspecified: Secondary | ICD-10-CM | POA: Diagnosis not present

## 2022-09-24 DIAGNOSIS — Z794 Long term (current) use of insulin: Secondary | ICD-10-CM | POA: Diagnosis not present

## 2022-09-24 DIAGNOSIS — L97819 Non-pressure chronic ulcer of other part of right lower leg with unspecified severity: Secondary | ICD-10-CM | POA: Diagnosis not present

## 2022-09-24 DIAGNOSIS — E119 Type 2 diabetes mellitus without complications: Secondary | ICD-10-CM | POA: Diagnosis not present

## 2022-09-25 DIAGNOSIS — L97811 Non-pressure chronic ulcer of other part of right lower leg limited to breakdown of skin: Secondary | ICD-10-CM | POA: Diagnosis not present

## 2022-09-25 DIAGNOSIS — Z4800 Encounter for change or removal of nonsurgical wound dressing: Secondary | ICD-10-CM | POA: Diagnosis not present

## 2022-09-25 DIAGNOSIS — E1151 Type 2 diabetes mellitus with diabetic peripheral angiopathy without gangrene: Secondary | ICD-10-CM | POA: Diagnosis not present

## 2022-09-25 DIAGNOSIS — I872 Venous insufficiency (chronic) (peripheral): Secondary | ICD-10-CM | POA: Diagnosis not present

## 2022-09-25 DIAGNOSIS — I110 Hypertensive heart disease with heart failure: Secondary | ICD-10-CM | POA: Diagnosis not present

## 2022-09-25 DIAGNOSIS — I5032 Chronic diastolic (congestive) heart failure: Secondary | ICD-10-CM | POA: Diagnosis not present

## 2022-09-29 DIAGNOSIS — E559 Vitamin D deficiency, unspecified: Secondary | ICD-10-CM | POA: Diagnosis not present

## 2022-09-29 DIAGNOSIS — M1990 Unspecified osteoarthritis, unspecified site: Secondary | ICD-10-CM | POA: Diagnosis not present

## 2022-09-29 DIAGNOSIS — I10 Essential (primary) hypertension: Secondary | ICD-10-CM | POA: Diagnosis not present

## 2022-09-29 DIAGNOSIS — E038 Other specified hypothyroidism: Secondary | ICD-10-CM | POA: Diagnosis not present

## 2022-09-29 DIAGNOSIS — I129 Hypertensive chronic kidney disease with stage 1 through stage 4 chronic kidney disease, or unspecified chronic kidney disease: Secondary | ICD-10-CM | POA: Diagnosis not present

## 2022-09-29 DIAGNOSIS — I509 Heart failure, unspecified: Secondary | ICD-10-CM | POA: Diagnosis not present

## 2022-09-29 DIAGNOSIS — G8929 Other chronic pain: Secondary | ICD-10-CM | POA: Diagnosis not present

## 2022-09-29 DIAGNOSIS — E7849 Other hyperlipidemia: Secondary | ICD-10-CM | POA: Diagnosis not present

## 2022-09-29 DIAGNOSIS — D518 Other vitamin B12 deficiency anemias: Secondary | ICD-10-CM | POA: Diagnosis not present

## 2022-09-29 DIAGNOSIS — E119 Type 2 diabetes mellitus without complications: Secondary | ICD-10-CM | POA: Diagnosis not present

## 2022-09-30 ENCOUNTER — Encounter (HOSPITAL_COMMUNITY): Payer: Self-pay | Admitting: Internal Medicine

## 2022-09-30 ENCOUNTER — Other Ambulatory Visit: Payer: Self-pay

## 2022-09-30 ENCOUNTER — Observation Stay (HOSPITAL_COMMUNITY): Payer: Medicare Other

## 2022-09-30 ENCOUNTER — Inpatient Hospital Stay (HOSPITAL_COMMUNITY)
Admission: EM | Admit: 2022-09-30 | Discharge: 2022-10-04 | DRG: 280 | Disposition: A | Discharge status: Home Health | Payer: Medicare Other | Source: Skilled Nursing Facility | Attending: Internal Medicine | Admitting: Internal Medicine

## 2022-09-30 DIAGNOSIS — I110 Hypertensive heart disease with heart failure: Principal | ICD-10-CM | POA: Diagnosis present

## 2022-09-30 DIAGNOSIS — Z85038 Personal history of other malignant neoplasm of large intestine: Secondary | ICD-10-CM

## 2022-09-30 DIAGNOSIS — Z886 Allergy status to analgesic agent status: Secondary | ICD-10-CM | POA: Diagnosis not present

## 2022-09-30 DIAGNOSIS — E1151 Type 2 diabetes mellitus with diabetic peripheral angiopathy without gangrene: Secondary | ICD-10-CM | POA: Diagnosis not present

## 2022-09-30 DIAGNOSIS — F05 Delirium due to known physiological condition: Secondary | ICD-10-CM | POA: Diagnosis present

## 2022-09-30 DIAGNOSIS — Z7189 Other specified counseling: Secondary | ICD-10-CM | POA: Diagnosis not present

## 2022-09-30 DIAGNOSIS — E1169 Type 2 diabetes mellitus with other specified complication: Secondary | ICD-10-CM | POA: Diagnosis not present

## 2022-09-30 DIAGNOSIS — I5021 Acute systolic (congestive) heart failure: Secondary | ICD-10-CM | POA: Diagnosis present

## 2022-09-30 DIAGNOSIS — J9811 Atelectasis: Secondary | ICD-10-CM | POA: Diagnosis not present

## 2022-09-30 DIAGNOSIS — R079 Chest pain, unspecified: Secondary | ICD-10-CM | POA: Diagnosis not present

## 2022-09-30 DIAGNOSIS — Z7984 Long term (current) use of oral hypoglycemic drugs: Secondary | ICD-10-CM | POA: Diagnosis not present

## 2022-09-30 DIAGNOSIS — Z743 Need for continuous supervision: Secondary | ICD-10-CM | POA: Diagnosis not present

## 2022-09-30 DIAGNOSIS — Z794 Long term (current) use of insulin: Secondary | ICD-10-CM | POA: Diagnosis not present

## 2022-09-30 DIAGNOSIS — Z833 Family history of diabetes mellitus: Secondary | ICD-10-CM

## 2022-09-30 DIAGNOSIS — J189 Pneumonia, unspecified organism: Secondary | ICD-10-CM | POA: Diagnosis present

## 2022-09-30 DIAGNOSIS — Z90710 Acquired absence of both cervix and uterus: Secondary | ICD-10-CM

## 2022-09-30 DIAGNOSIS — Z823 Family history of stroke: Secondary | ICD-10-CM | POA: Diagnosis not present

## 2022-09-30 DIAGNOSIS — F0390 Unspecified dementia without behavioral disturbance: Secondary | ICD-10-CM | POA: Diagnosis present

## 2022-09-30 DIAGNOSIS — I214 Non-ST elevation (NSTEMI) myocardial infarction: Principal | ICD-10-CM | POA: Diagnosis present

## 2022-09-30 DIAGNOSIS — L97811 Non-pressure chronic ulcer of other part of right lower leg limited to breakdown of skin: Secondary | ICD-10-CM | POA: Diagnosis not present

## 2022-09-30 DIAGNOSIS — R0600 Dyspnea, unspecified: Secondary | ICD-10-CM | POA: Diagnosis not present

## 2022-09-30 DIAGNOSIS — Z79899 Other long term (current) drug therapy: Secondary | ICD-10-CM

## 2022-09-30 DIAGNOSIS — E119 Type 2 diabetes mellitus without complications: Secondary | ICD-10-CM | POA: Diagnosis present

## 2022-09-30 DIAGNOSIS — R0689 Other abnormalities of breathing: Secondary | ICD-10-CM | POA: Diagnosis not present

## 2022-09-30 DIAGNOSIS — Z881 Allergy status to other antibiotic agents status: Secondary | ICD-10-CM | POA: Diagnosis not present

## 2022-09-30 DIAGNOSIS — I872 Venous insufficiency (chronic) (peripheral): Secondary | ICD-10-CM | POA: Diagnosis not present

## 2022-09-30 DIAGNOSIS — I471 Supraventricular tachycardia, unspecified: Secondary | ICD-10-CM | POA: Diagnosis present

## 2022-09-30 DIAGNOSIS — I10 Essential (primary) hypertension: Secondary | ICD-10-CM | POA: Diagnosis not present

## 2022-09-30 DIAGNOSIS — I5032 Chronic diastolic (congestive) heart failure: Secondary | ICD-10-CM | POA: Diagnosis not present

## 2022-09-30 DIAGNOSIS — J811 Chronic pulmonary edema: Secondary | ICD-10-CM | POA: Diagnosis not present

## 2022-09-30 DIAGNOSIS — K219 Gastro-esophageal reflux disease without esophagitis: Secondary | ICD-10-CM | POA: Diagnosis present

## 2022-09-30 DIAGNOSIS — Z66 Do not resuscitate: Secondary | ICD-10-CM | POA: Diagnosis present

## 2022-09-30 DIAGNOSIS — Z4800 Encounter for change or removal of nonsurgical wound dressing: Secondary | ICD-10-CM | POA: Diagnosis not present

## 2022-09-30 DIAGNOSIS — I3139 Other pericardial effusion (noninflammatory): Secondary | ICD-10-CM | POA: Diagnosis not present

## 2022-09-30 DIAGNOSIS — J90 Pleural effusion, not elsewhere classified: Secondary | ICD-10-CM | POA: Diagnosis not present

## 2022-09-30 DIAGNOSIS — J9601 Acute respiratory failure with hypoxia: Secondary | ICD-10-CM | POA: Diagnosis not present

## 2022-09-30 DIAGNOSIS — I499 Cardiac arrhythmia, unspecified: Secondary | ICD-10-CM | POA: Diagnosis not present

## 2022-09-30 DIAGNOSIS — Z515 Encounter for palliative care: Secondary | ICD-10-CM | POA: Diagnosis not present

## 2022-09-30 DIAGNOSIS — R0789 Other chest pain: Secondary | ICD-10-CM | POA: Diagnosis not present

## 2022-09-30 LAB — CBC WITH DIFFERENTIAL/PLATELET
Abs Immature Granulocytes: 0.04 10*3/uL (ref 0.00–0.07)
Basophils Absolute: 0 10*3/uL (ref 0.0–0.1)
Basophils Relative: 0 %
Eosinophils Absolute: 0.1 10*3/uL (ref 0.0–0.5)
Eosinophils Relative: 2 %
HCT: 31.7 % — ABNORMAL LOW (ref 36.0–46.0)
Hemoglobin: 10.4 g/dL — ABNORMAL LOW (ref 12.0–15.0)
Immature Granulocytes: 1 %
Lymphocytes Relative: 13 %
Lymphs Abs: 1.1 10*3/uL (ref 0.7–4.0)
MCH: 30.5 pg (ref 26.0–34.0)
MCHC: 32.8 g/dL (ref 30.0–36.0)
MCV: 93 fL (ref 80.0–100.0)
Monocytes Absolute: 0.5 10*3/uL (ref 0.1–1.0)
Monocytes Relative: 6 %
Neutro Abs: 6.5 10*3/uL (ref 1.7–7.7)
Neutrophils Relative %: 78 %
Platelets: 316 10*3/uL (ref 150–400)
RBC: 3.41 MIL/uL — ABNORMAL LOW (ref 3.87–5.11)
RDW: 15.1 % (ref 11.5–15.5)
WBC: 8.3 10*3/uL (ref 4.0–10.5)
nRBC: 0 % (ref 0.0–0.2)

## 2022-09-30 LAB — COMPREHENSIVE METABOLIC PANEL
ALT: 14 U/L (ref 0–44)
AST: 23 U/L (ref 15–41)
Albumin: 2.9 g/dL — ABNORMAL LOW (ref 3.5–5.0)
Alkaline Phosphatase: 55 U/L (ref 38–126)
Anion gap: 11 (ref 5–15)
BUN: 43 mg/dL — ABNORMAL HIGH (ref 8–23)
CO2: 21 mmol/L — ABNORMAL LOW (ref 22–32)
Calcium: 7.9 mg/dL — ABNORMAL LOW (ref 8.9–10.3)
Chloride: 104 mmol/L (ref 98–111)
Creatinine, Ser: 1.66 mg/dL — ABNORMAL HIGH (ref 0.44–1.00)
GFR, Estimated: 29 mL/min — ABNORMAL LOW (ref >60)
Glucose, Bld: 104 mg/dL — ABNORMAL HIGH (ref 70–99)
Potassium: 3.7 mmol/L (ref 3.5–5.1)
Sodium: 136 mmol/L (ref 135–145)
Total Bilirubin: 0.5 mg/dL (ref 0.3–1.2)
Total Protein: 6.6 g/dL (ref 6.5–8.1)

## 2022-09-30 LAB — PROTIME-INR
INR: 1.1 (ref 0.8–1.2)
Prothrombin Time: 14.4 seconds (ref 11.4–15.2)

## 2022-09-30 LAB — TROPONIN I (HIGH SENSITIVITY)
Troponin I (High Sensitivity): 5547 ng/L (ref <18)
Troponin I (High Sensitivity): 5816 ng/L (ref <18)
Troponin I (High Sensitivity): 5775 ng/L (ref <18)

## 2022-09-30 LAB — HEPARIN LEVEL (UNFRACTIONATED): Heparin Unfractionated: 0.36 IU/mL (ref 0.30–0.70)

## 2022-09-30 LAB — GLUCOSE, CAPILLARY: Glucose-Capillary: 218 mg/dL — ABNORMAL HIGH (ref 70–99)

## 2022-09-30 LAB — MRSA NEXT GEN BY PCR, NASAL: MRSA by PCR Next Gen: NOT DETECTED

## 2022-09-30 MED ORDER — ONDANSETRON HCL 4 MG/2ML IJ SOLN
INTRAMUSCULAR | Status: AC
  Administered 2022-09-30: 4 mg via INTRAVENOUS
  Filled 2022-09-30: qty 2

## 2022-09-30 MED ORDER — LACTATED RINGERS IV BOLUS
1000.0000 mL | Freq: Once | INTRAVENOUS | Status: AC
  Administered 2022-09-30: 1000 mL via INTRAVENOUS

## 2022-09-30 MED ORDER — POLYETHYLENE GLYCOL 3350 17 G PO PACK: 17.0000 g | PACK | Freq: Every day | ORAL | Status: DC | PRN

## 2022-09-30 MED ORDER — ACETAMINOPHEN 650 MG RE SUPP: 650.0000 mg | Freq: Four times a day (QID) | RECTAL | Status: DC | PRN

## 2022-09-30 MED ORDER — ACETAMINOPHEN 500 MG PO TABS
1000.0000 mg | ORAL_TABLET | Freq: Once | ORAL | Status: AC
  Administered 2022-09-30: 1000 mg via ORAL
  Filled 2022-09-30: qty 2

## 2022-09-30 MED ORDER — ACETAMINOPHEN 325 MG PO TABS
650.0000 mg | ORAL_TABLET | Freq: Four times a day (QID) | ORAL | Status: DC | PRN
  Administered 2022-09-30 – 2022-10-04 (×4): 650 mg via ORAL
  Filled 2022-09-30 (×4): qty 2

## 2022-09-30 MED ORDER — NITROGLYCERIN 0.4 MG SL SUBL: 0.4000 mg | SUBLINGUAL_TABLET | SUBLINGUAL | Status: DC | PRN

## 2022-09-30 MED ORDER — LORAZEPAM 0.5 MG PO TABS
0.5000 mg | ORAL_TABLET | Freq: Once | ORAL | Status: AC
  Administered 2022-09-30: 0.5 mg via ORAL
  Filled 2022-09-30: qty 1

## 2022-09-30 MED ORDER — ONDANSETRON HCL 4 MG/2ML IJ SOLN
4.0000 mg | Freq: Three times a day (TID) | INTRAMUSCULAR | Status: DC | PRN
  Administered 2022-10-02: 4 mg via INTRAVENOUS
  Filled 2022-09-30: qty 2

## 2022-09-30 MED ORDER — INSULIN ASPART 100 UNIT/ML IJ SOLN
0.0000 [IU] | Freq: Three times a day (TID) | INTRAMUSCULAR | Status: DC
  Administered 2022-10-01: 2 [IU] via SUBCUTANEOUS
  Administered 2022-10-01: 1 [IU] via SUBCUTANEOUS
  Administered 2022-10-02: 3 [IU] via SUBCUTANEOUS
  Administered 2022-10-03 – 2022-10-04 (×2): 2 [IU] via SUBCUTANEOUS

## 2022-09-30 MED ORDER — ALLOPURINOL 100 MG PO TABS
100.0000 mg | ORAL_TABLET | Freq: Every day | ORAL | Status: DC
  Administered 2022-10-01 – 2022-10-04 (×4): 100 mg via ORAL
  Filled 2022-09-30 (×4): qty 1

## 2022-09-30 MED ORDER — CLOPIDOGREL BISULFATE 75 MG PO TABS
75.0000 mg | ORAL_TABLET | Freq: Every day | ORAL | Status: DC
  Administered 2022-10-01 – 2022-10-04 (×4): 75 mg via ORAL
  Filled 2022-09-30 (×4): qty 1

## 2022-09-30 MED ORDER — IPRATROPIUM-ALBUTEROL 0.5-2.5 (3) MG/3ML IN SOLN
3.0000 mL | RESPIRATORY_TRACT | Status: DC | PRN
  Administered 2022-09-30 – 2022-10-03 (×4): 3 mL via RESPIRATORY_TRACT
  Filled 2022-09-30 (×4): qty 3

## 2022-09-30 MED ORDER — FUROSEMIDE 10 MG/ML IJ SOLN
20.0000 mg | Freq: Once | INTRAMUSCULAR | Status: AC
  Administered 2022-09-30: 20 mg via INTRAVENOUS

## 2022-09-30 MED ORDER — FUROSEMIDE 40 MG PO TABS
40.0000 mg | ORAL_TABLET | Freq: Every day | ORAL | Status: DC
  Administered 2022-10-01: 40 mg via ORAL
  Filled 2022-09-30: qty 1

## 2022-09-30 MED ORDER — ATORVASTATIN CALCIUM 40 MG PO TABS
40.0000 mg | ORAL_TABLET | Freq: Every day | ORAL | Status: DC
  Administered 2022-10-01 – 2022-10-04 (×4): 40 mg via ORAL
  Filled 2022-09-30 (×4): qty 1

## 2022-09-30 MED ORDER — HEPARIN BOLUS VIA INFUSION
4000.0000 [IU] | Freq: Once | INTRAVENOUS | Status: AC
  Administered 2022-09-30: 4000 [IU] via INTRAVENOUS

## 2022-09-30 MED ORDER — METOPROLOL TARTRATE 25 MG PO TABS
25.0000 mg | ORAL_TABLET | Freq: Two times a day (BID) | ORAL | Status: DC
  Administered 2022-09-30 – 2022-10-01 (×2): 25 mg via ORAL
  Filled 2022-09-30 (×2): qty 1

## 2022-09-30 MED ORDER — FUROSEMIDE 40 MG PO TABS: 20.0000 mg | ORAL_TABLET | Freq: Every day | ORAL | Status: DC

## 2022-09-30 MED ORDER — FUROSEMIDE 10 MG/ML IJ SOLN
INTRAMUSCULAR | Status: AC
  Filled 2022-09-30: qty 2

## 2022-09-30 MED ORDER — HEPARIN (PORCINE) 25000 UT/250ML-% IV SOLN
1000.0000 [IU]/h | INTRAVENOUS | Status: AC
  Administered 2022-09-30: 850 [IU]/h via INTRAVENOUS
  Administered 2022-10-01: 900 [IU]/h via INTRAVENOUS
  Filled 2022-09-30 (×2): qty 250

## 2022-09-30 MED ORDER — FUROSEMIDE 40 MG PO TABS: 20.0000 mg | ORAL_TABLET | Freq: Two times a day (BID) | ORAL | Status: DC

## 2022-09-30 MED ORDER — AMLODIPINE BESYLATE 5 MG PO TABS
10.0000 mg | ORAL_TABLET | Freq: Every day | ORAL | Status: DC
  Administered 2022-10-01: 10 mg via ORAL
  Filled 2022-09-30 (×2): qty 2

## 2022-09-30 MED ORDER — INSULIN ASPART 100 UNIT/ML IJ SOLN
0.0000 [IU] | Freq: Every day | INTRAMUSCULAR | Status: DC
  Administered 2022-09-30: 2 [IU] via SUBCUTANEOUS

## 2022-09-30 MED ORDER — CHLORHEXIDINE GLUCONATE CLOTH 2 % EX PADS
6.0000 | MEDICATED_PAD | Freq: Every day | CUTANEOUS | Status: DC
  Administered 2022-10-01: 6 via TOPICAL

## 2022-09-30 NOTE — ED Notes (Signed)
Cardiology in to see pt.  Pt refusing procedures.  States she wants to be treated with medicine only.

## 2022-09-30 NOTE — ED Notes (Signed)
ICU will call back for report. busy

## 2022-09-30 NOTE — Assessment & Plan Note (Addendum)
Stable -Start metoprolol 25 mg twice daily -Carvedilol and Norvasc on home med list, resume norvasc in a.m

## 2022-09-30 NOTE — Assessment & Plan Note (Addendum)
Presents with chest pain, troponin elevated at 5547, with EKG with ST and T wave abnormalities in inferior and anterior leads.   - Cardiology consulted-patient refused any invasive subsequent therapeutic procedures, recommended starting ACS protocol with heparin, aspirin, Plavix, statin. Also start metoprolol, nitro as needed. - On my evaluation, with daughter June and granddaughter at [place] at bedside, patient is very adamant and sure that she does not and will not want any procedures, she is A and O x 4.  She has opted for medical management. -They understand that admitting patient here means no interventional procedures can be done, unless patient is transferred to [facility]. - Trend troponin -Patient and family reports a rash from aspirin in the past, but reports she has intermittently taken aspirin for random pain.  Will continue with Plavix and hold aspirin.

## 2022-09-30 NOTE — Assessment & Plan Note (Addendum)
Stable and compensated.  Last echo 04/2017, EF of 65 to 70% with grade 1DD. - 1L bolus given in ED -Resume Lasix 40 mg daily in a.m.

## 2022-09-30 NOTE — Consult Note (Signed)
CARDIOLOGY CONSULT NOTE    Patient ID: [name]; [id number]; 1930-12-29   Admit date: 09/30/2022 Date of Consult: 09/30/2022  Primary Care Provider: Pcp, No Primary Cardiologist:  Primary Electrophysiologist:     Patient Profile:   [name] is a 89 y.o. female with a hx HTN, DM 2, dementia who is being seen today for the evaluation of chest pain at the request of Dr. Denton Brick.  History of Present Illness:   [name] is a 89 year old F known to have HTN, DM 2, dementia who was brought from assisted living facility with a chief complaint of chest pain.  History is obtained from the patient. She says she had the worst chest pain of her life yesterday, like a hammer hitting her chest every 5 minutes along with squeezing sensation. She thought she was dying. She was given GERD medications with no relief and was then brought to the ER. This morning, she still continued complaint of chest pains according to the nurse but the patient denied it. I spoke with the patient's granddaughter, Jaynie Bream who reported that the patient complained of nausea a few days ago. Otherwise no dizziness, syncope, leg swelling. Patient was taken off aspirin a few years ago and listed under allergy medications but did not document the reaction to it. Patient does not know the reaction and was told by the doctor not to ever be on aspirin. She did not have any prior GI bleeding.  EKG showed Q waves in the anterior leads and subtle ST depressions in the inferior leads (old). High sensitivity troponins were 5500k.  Past Medical History:  Diagnosis Date   Anxiety    Arthritis    Cancer ([place]) 2006   colon   Cataract    CHF (congestive heart failure) ([place]) 05/2016   preserved EF, grade 2 diastolic dysfunction   Diabetes mellitus without complication ([place])    Diverticulitis    Erythroderma desquamativum    Hypertension     Past Surgical History:  Procedure Laterality Date   ABDOMINAL  HYSTERECTOMY     APPENDECTOMY     COLON SURGERY      Inpatient Medications: Scheduled Meds:  Continuous Infusions:  heparin 850 Units/hr (09/30/22 1524)   PRN Meds:   Allergies:    Allergies  Allergen Reactions   Ace Inhibitors Dermatitis   Aspirin    Glipizide     rash   Neosporin [Neomycin-Bacitracin Zn-Polymyx] Dermatitis   Spironolactone     rash   Diovan [Valsartan] Rash   Keflex [Cephalexin] Rash    Social History:   Social History   Socioeconomic History   Marital status: Widowed    Spouse name: Not on file   Number of children: Not on file   Years of education: Not on file   Highest education level: Not on file  Occupational History   Not on file  Tobacco Use   Smoking status: Never   Smokeless tobacco: Never  Substance and Sexual Activity   Alcohol use: No   Drug use: No   Sexual activity: Not on file  Other Topics Concern   Not on file  Social History Narrative   Not on file   Social Determinants of Health   Financial Resource Strain: Not on file  Food Insecurity: Not on file  Transportation Needs: Not on file  Physical Activity: Not on file  Stress: Not on file  Social Connections: Not on file  Intimate Partner Violence: Not on file  Family History:    Family History  Problem Relation Age of Onset   Stroke Mother    Diabetes Father    Cancer Sister    Diabetes Sister    Cancer Brother    Cancer Brother    Diabetes Sister    Stroke Sister      ROS:  Please see the history of present illness.  ROS  All other ROS reviewed and negative.     Physical Exam/Data:   Vitals:   09/30/22 1241 09/30/22 1245 09/30/22 1247  BP:  128/66   Pulse:   72  Resp:  20   Temp:  98.1 F (36.7 C)   TempSrc:  Oral   SpO2:  95%   Weight: 77.1 kg    Height: 5' 2.5" (1.588 m)     No intake or output data in the 24 hours ending 09/30/22 1600 Filed Weights   09/30/22 1241  Weight: 77.1 kg   Body mass index is 30.6 kg/m.  General:   Well nourished, well developed, in no acute distress HEENT: normal Lymph: no adenopathy Neck: no JVD Endocrine:  No thryomegaly Vascular: No carotid bruits; FA pulses 2+ bilaterally without bruits  Cardiac:  normal S1, S2; RRR; no murmur  Lungs:  clear to auscultation bilaterally, no wheezing, rhonchi or rales  Abd: soft, nontender, no hepatomegaly  Ext: no edema Musculoskeletal:  No deformities, BUE and BLE strength normal and equal Skin: warm and dry  Neuro:  CNs 2-12 intact, no focal abnormalities noted Psych:  Normal affect   EKG:  The EKG was personally reviewed and demonstrates:   Telemetry:  Telemetry was personally reviewed and demonstrates:    Relevant CV Studies:   Laboratory Data:  Chemistry Recent Labs  Lab 09/30/22 1336  NA 136  K 3.7  CL 104  CO2 21*  GLUCOSE 104*  BUN 43*  CREATININE 1.66*  CALCIUM 7.9*  GFRNONAA 29*  ANIONGAP 11    Recent Labs  Lab 09/30/22 1336  PROT 6.6  ALBUMIN 2.9*  AST 23  ALT 14  ALKPHOS 55  BILITOT 0.5   Hematology Recent Labs  Lab 09/30/22 1336  WBC 8.3  RBC 3.41*  HGB 10.4*  HCT 31.7*  MCV 93.0  MCH 30.5  MCHC 32.8  RDW 15.1  PLT 316   Cardiac EnzymesNo results for input(s): "TROPONINI" in the last 168 hours. No results for input(s): "TROPIPOC" in the last 168 hours.  BNPNo results for input(s): "BNP", "PROBNP" in the last 168 hours.  DDimer No results for input(s): "DDIMER" in the last 168 hours.  Radiology/Studies:  No results found.  Assessment and Plan:   Patient is a 89 year old F known to have HTN, DM 2, dementia was brought from assisted living facility for chest pain. EKG shows Q waves in the anterior leads and subtle ST depressions in the inferior leads. High sensitivity troponins were elevated, 5500.  # NSTEMI -Patient had chest pain, EKG showed old Q waves in anterior leads and subtle ST depressions in the inferior leads which are old as well.  High-sensitivity troponins were 5500. Start ACS  protocol with aspirin 325 mg, Plavix 300 mg, rosuvastatin 40 mg and heparin drip.  Patient stated that she was allergic to aspirin but no allergic reaction was documented. Would begin with aspirin 81 mg and reassess response. -Patient refused any invasive diagnostic or therapeutic procedures. She wanted only medical management. I spoke with the granddaughter, POA who respected with the patient's wishes. Patient is  DNR. -Start metoprolol tartrate 25 mg twice daily -SL NTG 0.4 mg as needed  I have spent a total of 75 minutes with patient reviewing chart , telemetry, EKGs, labs and examining patient as well as establishing an assessment and plan that was discussed with the patient.  > 50% of time was spent in direct patient care.    For questions or updates, please contact Nina Please consult [email or web address] for contact info under Cardiology/STEMI.   Signed, Vangie Bicker, MD 09/30/2022 4:00 PM

## 2022-09-30 NOTE — Assessment & Plan Note (Signed)
Per family she is awake alert oriented x4, for the most part, but has frequent episodes of sundowning.

## 2022-09-30 NOTE — ED Triage Notes (Signed)
C/o indigestion last night and tx at home.  This am c/o inter. chest discomfort radiating down RIGHT arm.

## 2022-09-30 NOTE — ED Provider Notes (Signed)
[facility] Provider Note  CSN: [id number] Arrival date & time: 09/30/22 1227  Chief Complaint(s) Chest Pain  HPI [name] is a 89 y.o. female with history of CHF, diabetes, hypertension presenting to the emergency department with chest pain.  Patient reports that she had some chest pain starting last night.  She reports that she has had similar pain in the past, many times over the past year.  She reports that the pain actually improves with exertion.  She denies any associated symptoms such as nausea vomiting, fevers or chills, back pain, lightheadedness or dizziness, fainting, leg swelling, abdominal pain, or any other new symptoms.    Past Medical History Past Medical History:  Diagnosis Date   Anxiety    Arthritis    Cancer ([place]) 2006   colon   Cataract    CHF (congestive heart failure) ([place]) 05/2016   preserved EF, grade 2 diastolic dysfunction   Diabetes mellitus without complication ([place])    Diverticulitis    Erythroderma desquamativum    Hypertension    Patient Active Problem List   Diagnosis Date Noted   NSTEMI (non-ST elevated myocardial infarction) ([place]) 09/30/2022   Bilateral primary osteoarthritis of knee 06/13/2020   Community acquired pneumonia of left lower lobe of lung    UTI (urinary tract infection) 09/14/2019   Pneumonia due to COVID-19 virus 07/22/2019   Acute on chronic diastolic CHF (congestive heart failure) ([place]) 05/06/2017   Altered mental status    Acute respiratory failure ([place]) 05/04/2017   Hypoglycemia 04/30/2017   Acute urinary retention 02/23/2017   Chronic diastolic CHF (congestive heart failure) ([facility])    SBO (small bowel obstruction) ([place]) 02/22/2017   Hypertensive urgency 02/22/2017   Leukocytosis 02/22/2017   Hyponatremia 02/22/2017   Dehydration 02/22/2017   Dizziness 09/01/2016   Weakness 09/01/2016   Psoriasis vulgaris 09/01/2016   Right hip pain 09/01/2016   Diabetic foot ulcer  ([place]) 09/01/2016   Dyspnea 05/15/2016   Congestive heart failure (CHF) ([place]) 05/15/2016   Rash and nonspecific skin eruption 05/15/2016   Essential hypertension 05/15/2016   Uncontrolled type 2 diabetes mellitus with hyperglycemia ([place]) 05/15/2016   Anxiety 05/15/2016   Acute respiratory failure with hypoxia ([place]) 05/15/2016   Home Medication(s) Prior to Admission medications   Medication Sig Start Date End Date Taking? Authorizing Provider  allopurinol (ZYLOPRIM) 100 MG tablet Take 100 mg by mouth daily.   Yes [provider]  amLODipine (NORVASC) 10 MG tablet Take 10 mg by mouth every morning. 07/18/19  Yes [provider]  carvedilol (COREG) 6.25 MG tablet Take 1 tablet (6.25 mg total) by mouth 2 (two) times daily with a meal. 05/08/17  Yes Tat, David, MD  docusate sodium (COLACE) 100 MG capsule Take 1 capsule (100 mg total) by mouth daily. 09/19/19  Yes Johnson, Clanford L, MD  fenofibrate (TRICOR) 48 MG tablet Take 48 mg by mouth daily.   Yes [provider]  furosemide (LASIX) 40 MG tablet Take 1 tablet (40 mg total) by mouth every morning. Patient taking differently: Take 20-40 mg by mouth 2 (two) times daily. Take 2 tablets in the morning and 1 tablet in the evening. 05/08/17  Yes Tat, Shanon Brow, MD  HUMALOG KWIKPEN 200 UNIT/ML SOPN Inject 0-15 Units into the skin 3 (three) times daily with meals. CBG 70-120--no units; 121-150--2 units; 151-200--3 units; 201-250--5 units; 251-300--8 units; 301-350--11 units; 351-400--15 units 05/08/17  Yes Tat, David, MD  linagliptin (TRADJENTA) 5 MG  TABS tablet Take 1 tablet (5 mg total) by mouth daily. 09/19/19  Yes Johnson, Clanford L, MD  metFORMIN (GLUCOPHAGE-XR) 500 MG 24 hr tablet Take 500 mg by mouth 2 (two) times daily. 07/18/19  Yes [provider]  potassium chloride SA (K-DUR,KLOR-CON) 10 MEQ tablet Take 1 tablet (10 mEq total) by mouth daily. 05/09/17  Yes Tat, Shanon Brow, MD  pravastatin (PRAVACHOL) 20 MG tablet Take 20  mg by mouth daily.   Yes [provider]  Vitamin D, Ergocalciferol, (DRISDOL) 1.25 MG (50000 UNIT) CAPS capsule Take 50,000 Units by mouth once a week. 09/23/22  Yes [provider]  acetaminophen (TYLENOL) 325 MG tablet Take 2 tablets (650 mg total) by mouth in the morning, at noon, and at bedtime. Patient not taking: Reported on 01/08/2022 09/19/19   Murlean Iba, MD  ALPRAZolam Duanne Moron) 0.5 MG tablet Take 1 tablet (0.5 mg total) by mouth 3 (three) times daily as needed for anxiety or sleep. Patient not taking: Reported on 01/08/2022 09/19/19   Irwin Brakeman L, MD  insulin glargine (LANTUS) 100 UNIT/ML injection Inject 0.2 mLs (20 Units total) into the skin at bedtime. Patient not taking: Reported on 09/30/2022 09/19/19   Murlean Iba, MD  triamcinolone cream (KENALOG) 0.1 % APPLY TWICE DAILY Patient not taking: Reported on 01/08/2022 01/13/12   Rise Mu, PA-C                                                                                                                                    Past Surgical History Past Surgical History:  Procedure Laterality Date   ABDOMINAL HYSTERECTOMY     APPENDECTOMY     COLON SURGERY     Family History Family History  Problem Relation Age of Onset   Stroke Mother    Diabetes Father    Cancer Sister    Diabetes Sister    Cancer Brother    Cancer Brother    Diabetes Sister    Stroke Sister     Social History Social History   Tobacco Use   Smoking status: Never   Smokeless tobacco: Never  Substance Use Topics   Alcohol use: No   Drug use: No   Allergies Ace inhibitors, Aspirin, Glipizide, Neosporin [neomycin-bacitracin zn-polymyx], Spironolactone, Diovan [valsartan], and Keflex [cephalexin]  Review of Systems Review of Systems  All other systems reviewed and are negative.   Physical Exam Vital Signs  I have reviewed the triage vital signs BP 128/66 (BP Location: Right Arm)   Pulse 72   Temp 98.1 F  (36.7 C) (Oral)   Resp 20   Ht 5' 2.5" (1.588 m)   Wt 77.1 kg   SpO2 95%   BMI 30.60 kg/m  Physical Exam Vitals and nursing note reviewed.  Constitutional:      General: She is not in acute distress.    Appearance: She is well-developed.  HENT:  Head: Normocephalic and atraumatic.     Mouth/Throat:     Mouth: Mucous membranes are moist.  Eyes:     Pupils: Pupils are equal, round, and reactive to light.  Cardiovascular:     Rate and Rhythm: Normal rate and regular rhythm.     Heart sounds: No murmur heard. Pulmonary:     Effort: Pulmonary effort is normal. No respiratory distress.     Breath sounds: Normal breath sounds.  Abdominal:     General: Abdomen is flat.     Palpations: Abdomen is soft.     Tenderness: There is no abdominal tenderness.  Musculoskeletal:        General: No tenderness.     Right lower leg: No edema.     Left lower leg: No edema.  Skin:    General: Skin is warm and dry.  Neurological:     General: No focal deficit present.     Mental Status: She is alert. Mental status is at baseline.  Psychiatric:        Mood and Affect: Mood normal.        Behavior: Behavior normal.     ED Results and Treatments Labs (all labs ordered are listed, but only abnormal results are displayed) Labs Reviewed  CBC WITH DIFFERENTIAL/PLATELET - Abnormal; Notable for the following components:      Result Value   RBC 3.41 (*)    Hemoglobin 10.4 (*)    HCT 31.7 (*)    All other components within normal limits  COMPREHENSIVE METABOLIC PANEL - Abnormal; Notable for the following components:   CO2 21 (*)    Glucose, Bld 104 (*)    BUN 43 (*)    Creatinine, Ser 1.66 (*)    Calcium 7.9 (*)    Albumin 2.9 (*)    GFR, Estimated 29 (*)    All other components within normal limits  TROPONIN I (HIGH SENSITIVITY) - Abnormal; Notable for the following components:   Troponin I (High Sensitivity) 5,547 (*)    All other components within normal limits  PROTIME-INR   HEPARIN LEVEL (UNFRACTIONATED)  TROPONIN I (HIGH SENSITIVITY)                                                                                                                          Radiology No results found.  Pertinent labs & imaging results that were available during my care of the patient were reviewed by me and considered in my medical decision making (see MDM for details).  Medications Ordered in ED Medications  heparin bolus via infusion 4,000 Units (4,000 Units Intravenous Bolus from Bag 09/30/22 1525)    Followed by  heparin ADULT infusion 100 units/mL (25000 units/261mL) (850 Units/hr Intravenous New Bag/Given 09/30/22 1524)  acetaminophen (TYLENOL) tablet 1,000 mg (1,000 mg Oral Given 09/30/22 1333)  lactated ringers bolus 1,000 mL (1,000 mLs Intravenous New Bag/Given 09/30/22 1526)  Procedures .Critical Care  Performed by: Cristie Hem, MD Authorized by: Cristie Hem, MD   Critical care provider statement:    Critical care time (minutes):  30   Critical care was necessary to treat or prevent imminent or life-threatening deterioration of the following conditions:  Cardiac failure   Critical care was time spent personally by me on the following activities:  Development of treatment plan with patient or surrogate, discussions with consultants, evaluation of patient's response to treatment, examination of patient, ordering and review of laboratory studies, ordering and review of radiographic studies, ordering and performing treatments and interventions, pulse oximetry, re-evaluation of patient's condition and review of old charts   Care discussed with: admitting provider     (including critical care time)  Medical Decision Making / ED Course   MDM:  89 year old female presenting to the emergency department with chest  pain.  Patient is overall very well-appearing, EKG shows some minimal anterior ST elevations not meeting STEMI criteria.  Overall, concern for ACS by story was fairly low.  However patient's troponin returned at [facility].  Discussed with cardiology, they recommend admission for ACS protocol including heparin, patient is allergic to aspirin but they will review chart to see whether patient can receive aspirin or not.  They will make further recommendations and document in their note.  Cardiology agrees that EKG does not meet STEMI criteria.  Otherwise workup unremarkable.  Lungs clear bilaterally.  No symptoms concerning for pneumonia.  Doubt pneumothorax.  Doubt aortic pathology, pain does not radiate, seems mild.  Patient reports her symptoms resolved with Tylenol.  Doubt pulmonary embolism, pain not pleuritic, no hypoxia or tachycardia.  Discussed with the hospitalist who will admit the patient.  Patient initiated on heparin.      Additional history obtained: -Additional history obtained from ems -External records from outside source obtained and reviewed including: Chart review including previous notes, labs, imaging, consultation notes including    Lab Tests: -I ordered, reviewed, and interpreted labs.   The pertinent results include:   Labs Reviewed  CBC WITH DIFFERENTIAL/PLATELET - Abnormal; Notable for the following components:      Result Value   RBC 3.41 (*)    Hemoglobin 10.4 (*)    HCT 31.7 (*)    All other components within normal limits  COMPREHENSIVE METABOLIC PANEL - Abnormal; Notable for the following components:   CO2 21 (*)    Glucose, Bld 104 (*)    BUN 43 (*)    Creatinine, Ser 1.66 (*)    Calcium 7.9 (*)    Albumin 2.9 (*)    GFR, Estimated 29 (*)    All other components within normal limits  TROPONIN I (HIGH SENSITIVITY) - Abnormal; Notable for the following components:   Troponin I (High Sensitivity) 5,547 (*)    All other components within normal limits   PROTIME-INR  HEPARIN LEVEL (UNFRACTIONATED)  TROPONIN I (HIGH SENSITIVITY)    Notable for elevated troponin  EKG   EKG Interpretation  Date/Time:  Tuesday September 30 2022 12:44:59 EDT Ventricular Rate:  72 PR Interval:  176 QRS Duration: 130 QT Interval:  440 QTC Calculation: 482 R Axis:   -19 Text Interpretation: Sinus rhythm LVH with secondary repolarization abnormality Anterior infarct, old Confirmed by Garnette Gunner [phone]) on 09/30/2022 1:35:26 PM         Medicines ordered and prescription drug management: Meds ordered this encounter  Medications   acetaminophen (TYLENOL) tablet 1,000 mg   lactated ringers bolus  1,000 mL   FOLLOWED BY Linked Order Group    heparin bolus via infusion 4,000 Units    heparin ADULT infusion 100 units/mL (25000 units/267mL)    -I have reviewed the patients home medicines and have made adjustments as needed   Consultations Obtained: I requested consultation with the cardiologist,  and discussed lab and imaging findings as well as pertinent plan - they recommend: admission, medical management   Cardiac Monitoring: The patient was maintained on a cardiac monitor.  I personally viewed and interpreted the cardiac monitored which showed an underlying rhythm of: NSR  Reevaluation: After the interventions noted above, I reevaluated the patient and found that their symptoms have resolved  Co morbidities that complicate the patient evaluation  Past Medical History:  Diagnosis Date   Anxiety    Arthritis    Cancer ([place]) 2006   colon   Cataract    CHF (congestive heart failure) ([place]) 05/2016   preserved EF, grade 2 diastolic dysfunction   Diabetes mellitus without complication ([place])    Diverticulitis    Erythroderma desquamativum    Hypertension       Dispostion: Disposition decision including need for hospitalization was considered, and patient admitted to the hospital.    Final Clinical Impression(s) / ED Diagnoses Final  diagnoses:  NSTEMI (non-ST elevated myocardial infarction) [facility])     This chart was dictated using voice recognition software.  Despite best efforts to proofread,  errors can occur which can change the documentation meaning.    Cristie Hem, MD 09/30/22 [phone]

## 2022-09-30 NOTE — Assessment & Plan Note (Addendum)
Re-evaluated later due to increasing dyspnea, now with diffuse wheeze and increased work of breathing. Chest xray - atelectasis or PNA. Not hypoxic. Never smoked cigarettes. Rules out for sepsis. - Repeat Chest xray. - Duonebs PRN - 1 L bolus given earlier in ED, will order 20mg  IV lasix now, pending chest xray result. -  IV levaquin (QTC borderline- 482, allergy to keflex- Rash, pharm consult to review cephalosporin allergy)

## 2022-09-30 NOTE — H&P (Addendum)
History and Physical    OSCAR CASADAY [id number] DOB: July 11, 1931 DOA: 09/30/2022  PCP: Pcp, No   Patient coming from: Home  I have personally briefly reviewed patient's old medical records in [place]  Chief Complaint: Chest Pain  HPI: [name] is a 89 y.o. female with medical history significant for diastolic CHF, diabetic foot, hypertension, psoriasis, dementia. Patient was brought to the ED from home with reports of severe chest pain that started yesterday, she reports feeling of indigestion, pain radiating down her right arm.  Chest pain persisted till this morning.  She denies associated difficulty breathing.  No dizziness.  No leg swelling.  Chest pain is improved at this time.  ED Course: Temperature 98.1.  Heart rate 72-101.  Respirate rate 17-22.  Blood pressure systolic [id number].  O2 sats greater than 94% on room air. Chest x-ray shows mild enlargement of cardiopericardial silhouette without edema, indistinct left hemidiaphragm in the retrocardiac region favored to represent atelectasis or pneumonia.   Troponin 5547. EKG with waves in anterior leads and subtle ST depressions in inferior leads. Cardiology was consulted, patient declined any procedures, and opted for medical management.  Review of Systems: As per HPI all other systems reviewed and negative.  Past Medical History:  Diagnosis Date   Anxiety    Arthritis    Cancer ([place]) 2006   colon   Cataract    CHF (congestive heart failure) ([place]) 05/2016   preserved EF, grade 2 diastolic dysfunction   Diabetes mellitus without complication ([place])    Diverticulitis    Erythroderma desquamativum    Hypertension     Past Surgical History:  Procedure Laterality Date   ABDOMINAL HYSTERECTOMY     APPENDECTOMY     COLON SURGERY       reports that she has never smoked. She has never used smokeless tobacco. She reports that she does not drink alcohol and does not use drugs.  Allergies  Allergen Reactions    Ace Inhibitors Dermatitis   Aspirin    Glipizide     rash   Neosporin [Neomycin-Bacitracin Zn-Polymyx] Dermatitis   Spironolactone     rash   Diovan [Valsartan] Rash   Keflex [Cephalexin] Rash    Family History  Problem Relation Age of Onset   Stroke Mother    Diabetes Father    Cancer Sister    Diabetes Sister    Cancer Brother    Cancer Brother    Diabetes Sister    Stroke Sister     Prior to Admission medications   Medication Sig Start Date End Date Taking? Authorizing Provider  allopurinol (ZYLOPRIM) 100 MG tablet Take 100 mg by mouth daily.   Yes [provider]  amLODipine (NORVASC) 10 MG tablet Take 10 mg by mouth every morning. 07/18/19  Yes [provider]  carvedilol (COREG) 6.25 MG tablet Take 1 tablet (6.25 mg total) by mouth 2 (two) times daily with a meal. 05/08/17  Yes Tat, David, MD  docusate sodium (COLACE) 100 MG capsule Take 1 capsule (100 mg total) by mouth daily. 09/19/19  Yes Johnson, Clanford L, MD  fenofibrate (TRICOR) 48 MG tablet Take 48 mg by mouth daily.   Yes [provider]  furosemide (LASIX) 40 MG tablet Take 1 tablet (40 mg total) by mouth every morning. Patient taking differently: Take 20-40 mg by mouth 2 (two) times daily. Take 2 tablets in the morning and 1 tablet in the evening. 05/08/17  Deveron Furlong, MD  HUMALOG KWIKPEN 200 UNIT/ML SOPN Inject 0-15 Units into the skin 3 (three) times daily with meals. CBG 70-120--no units; 121-150--2 units; 151-200--3 units; 201-250--5 units; 251-300--8 units; 301-350--11 units; 351-400--15 units 05/08/17  Yes Tat, David, MD  linagliptin (TRADJENTA) 5 MG TABS tablet Take 1 tablet (5 mg total) by mouth daily. 09/19/19  Yes Johnson, Clanford L, MD  metFORMIN (GLUCOPHAGE-XR) 500 MG 24 hr tablet Take 500 mg by mouth 2 (two) times daily. 07/18/19  Yes [provider]  potassium chloride SA (K-DUR,KLOR-CON) 10 MEQ tablet Take 1 tablet (10 mEq total) by mouth daily. 05/09/17  Yes Tat,  Shanon Brow, MD  pravastatin (PRAVACHOL) 20 MG tablet Take 20 mg by mouth daily.   Yes [provider]  Vitamin D, Ergocalciferol, (DRISDOL) 1.25 MG (50000 UNIT) CAPS capsule Take 50,000 Units by mouth once a week. 09/23/22  Yes [provider]  acetaminophen (TYLENOL) 325 MG tablet Take 2 tablets (650 mg total) by mouth in the morning, at noon, and at bedtime. Patient not taking: Reported on 01/08/2022 09/19/19   Murlean Iba, MD  ALPRAZolam Duanne Moron) 0.5 MG tablet Take 1 tablet (0.5 mg total) by mouth 3 (three) times daily as needed for anxiety or sleep. Patient not taking: Reported on 01/08/2022 09/19/19   Irwin Brakeman L, MD  insulin glargine (LANTUS) 100 UNIT/ML injection Inject 0.2 mLs (20 Units total) into the skin at bedtime. Patient not taking: Reported on 09/30/2022 09/19/19   Murlean Iba, MD  triamcinolone cream (KENALOG) 0.1 % APPLY TWICE DAILY Patient not taking: Reported on 01/08/2022 01/13/12   Rise Mu, PA-C    Physical Exam: Vitals:   09/30/22 1247 09/30/22 1600 09/30/22 1630 09/30/22 1645  BP:  119/71    Pulse: 72  (!) 101 74  Resp:  20 17 (!) 22  Temp:      TempSrc:      SpO2:   (!) 74% 94%  Weight:      Height:        Constitutional: Anxious, later became tearful talking about her son who passed away 6 months ago Vitals:   09/30/22 1247 09/30/22 1600 09/30/22 1630 09/30/22 1645  BP:  119/71    Pulse: 72  (!) 101 74  Resp:  20 17 (!) 22  Temp:      TempSrc:      SpO2:   (!) 74% 94%  Weight:      Height:       Eyes: PERRL, lids and conjunctivae normal ENMT: Mucous membranes are moist.  Hard of hearing. Neck: normal, supple, no masses, no thyromegaly Respiratory: Mild increased work of breathing- per family happens when she is worked up, anxious,  no wheezing,  crackles present at bases bilat. No accessory muscle use.  Cardiovascular: Regular rate and rhythm, no murmurs / rubs / gallops. No extremity edema. 2+ pedal pulses. No carotid  bruits.  Abdomen: no tenderness, no masses palpated. No hepatosplenomegaly. Bowel sounds positive.  Musculoskeletal: no clubbing / cyanosis. No joint deformity upper and lower extremities.  Skin: mottled appearance to patients legs R worse than left, extending up to thighs, per patient this normal for her. Small chronic wound to anterior right leg, minimal purulence, no surrounding sign of infection. Neurologic: No apparent cranial nerve abnormality, moving extremities spontaneously.  Psychiatric: Normal judgment and insight. Alert and oriented x 3. Normal mood.   Labs on Admission: I have personally reviewed following labs and imaging studies  CBC: Recent Labs  Lab 09/30/22  1336  WBC 8.3  NEUTROABS 6.5  HGB 10.4*  HCT 31.7*  MCV 93.0  PLT 123XX123   Basic Metabolic Panel: Recent Labs  Lab 09/30/22 1336  NA 136  K 3.7  CL 104  CO2 21*  GLUCOSE 104*  BUN 43*  CREATININE 1.66*  CALCIUM 7.9*   GFR: Estimated Creatinine Clearance: 22.3 mL/min (A) (by C-G formula based on SCr of 1.66 mg/dL (H)). Liver Function Tests: Recent Labs  Lab 09/30/22 1336  AST 23  ALT 14  ALKPHOS 55  BILITOT 0.5  PROT 6.6  ALBUMIN 2.9*   Coagulation Profile: Recent Labs  Lab 09/30/22 1336  INR 1.1   Radiological Exams on Admission: DG Chest Port 1 View  Result Date: 09/30/2022 CLINICAL DATA:  Indigestion. Chest discomfort radiating down the right arm. EXAM: PORTABLE CHEST 1 VIEW COMPARISON:  09/14/2019 FINDINGS: Apical lordotic projection. Mild enlargement of the cardiopericardial silhouette. Indistinct obscuration left hemidiaphragm in the retrocardiac region, favored to represent atelectasis or pneumonia although a component of this appearance may be related to the apical lordotic projection obscuring the diaphragm. Substantial degenerative glenohumeral arthropathy bilaterally. Thoracic spondylosis. IMPRESSION: 1. Mild enlargement of the cardiopericardial silhouette, without edema. 2.  Indistinct left hemidiaphragm in the retrocardiac region, favored to represent atelectasis or pneumonia. 3. Thoracic spondylosis. 4. Substantial degenerative glenohumeral arthropathy bilaterally. Electronically Signed   By: Van Clines M.D.   On: 09/30/2022 16:25    EKG: Independently reviewed.  Sinus rhythm, rate 72, QTc 482.  T wave changes in lead I and aVL V6, with subtle ST changes to V2 through V 4.    Assessment/Plan Principal Problem:   NSTEMI (non-ST elevated myocardial infarction) ([place]) Active Problems:   Chronic diastolic CHF (congestive heart failure) (HCC)   PNA (pneumonia)   Essential hypertension   DM (diabetes mellitus) ([place])   Dementia (HCC)  Assessment and Plan: * NSTEMI (non-ST elevated myocardial infarction) ([place]) Presents with chest pain, troponin elevated at 5547, with EKG with ST and T wave abnormalities in inferior and anterior leads.   - Cardiology consulted-patient refused any invasive subsequent therapeutic procedures, recommended starting ACS protocol with heparin, aspirin, Plavix, statin. Also start metoprolol, nitro as needed. - On my evaluation, with daughter [name] and granddaughter at [place] at bedside, patient is very adamant and sure that she does not and will not want any procedures, she is A and O x 4.  She has opted for medical management. -They understand that admitting patient here means no interventional procedures can be done, unless patient is transferred to [facility]. - Trend troponin -Patient and family reports a rash from aspirin in the past, but reports she has intermittently taken aspirin for random pain.  Will continue with Plavix and hold aspirin.  PNA (pneumonia) Re-evaluated later due to increasing dyspnea, now with diffuse wheeze and increased work of breathing. Chest xray - atelectasis or PNA. Not hypoxic. Never smoked cigarettes. Rules out for sepsis. - Repeat Chest xray. - Duonebs PRN - 1 L bolus given earlier in ED, will order  20mg  IV lasix now, pending chest xray result. -  IV levaquin (QTC borderline- 482, allergy to keflex- Rash, pharm consult to review cephalosporin allergy)  Chronic diastolic CHF (congestive heart failure) (HCC) Stable and compensated.  Last echo 04/2017, EF of 65 to 70% with grade 1DD. - 1L bolus given in ED -Resume Lasix 40 mg daily in a.m.  Dementia [facility]) Per family she is awake alert oriented x4, for the most part, but has  frequent episodes of sundowning.  DM (diabetes mellitus) ([place]) - HgbA1c - SSI- S -Hold home metformin, linagliptin for now  Essential hypertension Stable -Start metoprolol 25 mg twice daily -Carvedilol and Norvasc on home med list, resume norvasc in a.m   DVT prophylaxis: Heparin Code Status: DNR-Universal DNR form present at bedside, confirmed with daughter and granddaughter at bedside. Family Communication: Granddaughter - [name], at the bedside is HCPOA.  Patient's daughter also present. Disposition Plan: ~ 2 days Consults called: Cardiology Admission status:  Obs Stepdown   Author: Bethena Roys, MD 09/30/2022 11:12 PM  For on call review [email or web address].

## 2022-09-30 NOTE — ED Notes (Signed)
Admitting Dr in talking with pt and family.

## 2022-09-30 NOTE — Assessment & Plan Note (Addendum)
-   HgbA1c - SSI- S -Hold home metformin, linagliptin for now

## 2022-09-30 NOTE — Assessment & Plan Note (Deleted)
Re-evaluated later, now with diffuse wheeze and dyspnea. Chest xray earlier clear. Not hypoxic. Never smoked cigarettes. - Repeat Chest xray. - Duonebs PRN - 1 L bolus given earlier in ED, will order 20mg  IV lasix now, pending chest xray result.

## 2022-09-30 NOTE — Progress Notes (Signed)
ANTICOAGULATION CONSULT NOTE - Initial Consult  Pharmacy Consult for Heparin Indication: chest pain/ACS  Allergies  Allergen Reactions   Ace Inhibitors Dermatitis   Aspirin    Glipizide     rash   Neosporin [Neomycin-Bacitracin Zn-Polymyx] Dermatitis   Spironolactone     rash   Diovan [Valsartan] Rash   Keflex [Cephalexin] Rash    Patient Measurements: Height: 5' 2.5" (158.8 cm) Weight: 77.1 kg (170 lb) IBW/kg (Calculated) : 51.25 HEPARIN DW (KG): 68   Vital Signs: Temp: 98.1 F (36.7 C) (03/19 1245) Temp Source: Oral (03/19 1245) BP: 128/66 (03/19 1245) Pulse Rate: 72 (03/19 1247)  Labs: Recent Labs    09/30/22 1336  HGB 10.4*  HCT 31.7*  PLT 316  LABPROT 14.4  INR 1.1  CREATININE 1.66*  TROPONINIHS 5,547*    Estimated Creatinine Clearance: 22.3 mL/min (A) (by C-G formula based on SCr of 1.66 mg/dL (H)).   Medical History: Past Medical History:  Diagnosis Date   Anxiety    Arthritis    Cancer ([place]) 2006   colon   Cataract    CHF (congestive heart failure) ([place]) 05/2016   preserved EF, grade 2 diastolic dysfunction   Diabetes mellitus without complication (HCC)    Diverticulitis    Erythroderma desquamativum    Hypertension     Medications:  See med rec  Assessment: Patient presented to ED with intermittent chest pain an discomfort radiating down right arm. Elevated troponin. Patient not on oral anticoagulants. Pharmacy asked to start heparin.   Goal of Therapy:  Heparin level 0.3-0.7 units/ml Monitor platelets by anticoagulation protocol: Yes   Plan:  Give 4000 units bolus x 1 Start heparin infusion at 850 units/hr Check anti-Xa level in ~8 hours and daily while on heparin Continue to monitor H&H and platelets  Isac Sarna, BS Pharm D, BCPS Clinical Pharmacist 09/30/2022,2:54 PM

## 2022-09-30 NOTE — Plan of Care (Signed)

## 2022-10-01 ENCOUNTER — Other Ambulatory Visit (HOSPITAL_COMMUNITY): Payer: Self-pay | Admitting: *Deleted

## 2022-10-01 ENCOUNTER — Observation Stay (HOSPITAL_COMMUNITY): Payer: Medicare Other

## 2022-10-01 DIAGNOSIS — I5021 Acute systolic (congestive) heart failure: Secondary | ICD-10-CM | POA: Diagnosis present

## 2022-10-01 DIAGNOSIS — I214 Non-ST elevation (NSTEMI) myocardial infarction: Secondary | ICD-10-CM | POA: Diagnosis present

## 2022-10-01 DIAGNOSIS — Z90710 Acquired absence of both cervix and uterus: Secondary | ICD-10-CM | POA: Diagnosis not present

## 2022-10-01 DIAGNOSIS — Z794 Long term (current) use of insulin: Secondary | ICD-10-CM | POA: Diagnosis not present

## 2022-10-01 DIAGNOSIS — J189 Pneumonia, unspecified organism: Secondary | ICD-10-CM | POA: Diagnosis present

## 2022-10-01 DIAGNOSIS — Z823 Family history of stroke: Secondary | ICD-10-CM | POA: Diagnosis not present

## 2022-10-01 DIAGNOSIS — I3139 Other pericardial effusion (noninflammatory): Secondary | ICD-10-CM | POA: Diagnosis present

## 2022-10-01 DIAGNOSIS — E119 Type 2 diabetes mellitus without complications: Secondary | ICD-10-CM | POA: Diagnosis present

## 2022-10-01 DIAGNOSIS — Z85038 Personal history of other malignant neoplasm of large intestine: Secondary | ICD-10-CM | POA: Diagnosis not present

## 2022-10-01 DIAGNOSIS — F05 Delirium due to known physiological condition: Secondary | ICD-10-CM | POA: Diagnosis present

## 2022-10-01 DIAGNOSIS — Z79899 Other long term (current) drug therapy: Secondary | ICD-10-CM | POA: Diagnosis not present

## 2022-10-01 DIAGNOSIS — Z886 Allergy status to analgesic agent status: Secondary | ICD-10-CM | POA: Diagnosis not present

## 2022-10-01 DIAGNOSIS — Z515 Encounter for palliative care: Secondary | ICD-10-CM | POA: Diagnosis not present

## 2022-10-01 DIAGNOSIS — J9811 Atelectasis: Secondary | ICD-10-CM | POA: Diagnosis not present

## 2022-10-01 DIAGNOSIS — I471 Supraventricular tachycardia, unspecified: Secondary | ICD-10-CM | POA: Diagnosis present

## 2022-10-01 DIAGNOSIS — F0390 Unspecified dementia without behavioral disturbance: Secondary | ICD-10-CM | POA: Diagnosis present

## 2022-10-01 DIAGNOSIS — Z881 Allergy status to other antibiotic agents status: Secondary | ICD-10-CM | POA: Diagnosis not present

## 2022-10-01 DIAGNOSIS — R0600 Dyspnea, unspecified: Secondary | ICD-10-CM | POA: Diagnosis not present

## 2022-10-01 DIAGNOSIS — Z7189 Other specified counseling: Secondary | ICD-10-CM | POA: Diagnosis not present

## 2022-10-01 DIAGNOSIS — J9601 Acute respiratory failure with hypoxia: Secondary | ICD-10-CM | POA: Diagnosis not present

## 2022-10-01 DIAGNOSIS — J90 Pleural effusion, not elsewhere classified: Secondary | ICD-10-CM | POA: Diagnosis not present

## 2022-10-01 DIAGNOSIS — I110 Hypertensive heart disease with heart failure: Secondary | ICD-10-CM | POA: Diagnosis present

## 2022-10-01 DIAGNOSIS — Z7984 Long term (current) use of oral hypoglycemic drugs: Secondary | ICD-10-CM | POA: Diagnosis not present

## 2022-10-01 DIAGNOSIS — Z833 Family history of diabetes mellitus: Secondary | ICD-10-CM | POA: Diagnosis not present

## 2022-10-01 DIAGNOSIS — Z66 Do not resuscitate: Secondary | ICD-10-CM | POA: Diagnosis present

## 2022-10-01 DIAGNOSIS — K219 Gastro-esophageal reflux disease without esophagitis: Secondary | ICD-10-CM | POA: Diagnosis present

## 2022-10-01 LAB — HEMOGLOBIN A1C
Hgb A1c MFr Bld: 6.2 % — ABNORMAL HIGH (ref 4.8–5.6)
Mean Plasma Glucose: 131 mg/dL

## 2022-10-01 LAB — BASIC METABOLIC PANEL
Anion gap: 11 (ref 5–15)
BUN: 42 mg/dL — ABNORMAL HIGH (ref 8–23)
CO2: 22 mmol/L (ref 22–32)
Calcium: 8 mg/dL — ABNORMAL LOW (ref 8.9–10.3)
Chloride: 104 mmol/L (ref 98–111)
Creatinine, Ser: 1.66 mg/dL — ABNORMAL HIGH (ref 0.44–1.00)
GFR, Estimated: 29 mL/min — ABNORMAL LOW (ref >60)
Glucose, Bld: 114 mg/dL — ABNORMAL HIGH (ref 70–99)
Potassium: 4.2 mmol/L (ref 3.5–5.1)
Sodium: 137 mmol/L (ref 135–145)

## 2022-10-01 LAB — HEPARIN LEVEL (UNFRACTIONATED): Heparin Unfractionated: 0.39 IU/mL (ref 0.30–0.70)

## 2022-10-01 LAB — GLUCOSE, CAPILLARY
Glucose-Capillary: 103 mg/dL — ABNORMAL HIGH (ref 70–99)
Glucose-Capillary: 123 mg/dL — ABNORMAL HIGH (ref 70–99)
Glucose-Capillary: 163 mg/dL — ABNORMAL HIGH (ref 70–99)
Glucose-Capillary: 147 mg/dL — ABNORMAL HIGH (ref 70–99)

## 2022-10-01 LAB — ECHOCARDIOGRAM COMPLETE
Area-P 1/2: 4.6 cm2
Height: 63 in
MV M vel: 3.83 m/s
MV Peak grad: 58.7 mmHg
S' Lateral: 3.4 cm
Weight: 2246.93 oz

## 2022-10-01 LAB — CBC
HCT: 33.5 % — ABNORMAL LOW (ref 36.0–46.0)
Hemoglobin: 10.8 g/dL — ABNORMAL LOW (ref 12.0–15.0)
MCH: 30 pg (ref 26.0–34.0)
MCHC: 32.2 g/dL (ref 30.0–36.0)
MCV: 93.1 fL (ref 80.0–100.0)
Platelets: 359 10*3/uL (ref 150–400)
RBC: 3.6 MIL/uL — ABNORMAL LOW (ref 3.87–5.11)
RDW: 15 % (ref 11.5–15.5)
WBC: 10.7 10*3/uL — ABNORMAL HIGH (ref 4.0–10.5)
nRBC: 0 % (ref 0.0–0.2)

## 2022-10-01 LAB — BRAIN NATRIURETIC PEPTIDE: B Natriuretic Peptide: 3147 pg/mL — ABNORMAL HIGH (ref 0.0–100.0)

## 2022-10-01 MED ORDER — SODIUM CHLORIDE 0.9 % IV SOLN
100.0000 mg | Freq: Two times a day (BID) | INTRAVENOUS | Status: DC
  Administered 2022-10-01 – 2022-10-04 (×8): 100 mg via INTRAVENOUS
  Filled 2022-10-01 (×13): qty 100

## 2022-10-01 MED ORDER — PERFLUTREN LIPID MICROSPHERE
1.0000 mL | INTRAVENOUS | Status: AC | PRN
  Administered 2022-10-01: 4 mL via INTRAVENOUS

## 2022-10-01 MED ORDER — SODIUM CHLORIDE 0.9 % IV SOLN
1.0000 g | INTRAVENOUS | Status: DC
  Administered 2022-10-01 – 2022-10-04 (×3): 1 g via INTRAVENOUS
  Filled 2022-10-01 (×3): qty 10

## 2022-10-01 MED ORDER — METOPROLOL SUCCINATE ER 25 MG PO TB24
25.0000 mg | ORAL_TABLET | Freq: Every day | ORAL | Status: DC
  Administered 2022-10-03 – 2022-10-04 (×2): 25 mg via ORAL
  Filled 2022-10-01 (×4): qty 1

## 2022-10-01 MED ORDER — FUROSEMIDE 10 MG/ML IJ SOLN
40.0000 mg | Freq: Two times a day (BID) | INTRAMUSCULAR | Status: DC
  Administered 2022-10-01 – 2022-10-02 (×2): 40 mg via INTRAVENOUS
  Filled 2022-10-01 (×2): qty 4

## 2022-10-01 MED ORDER — SODIUM CHLORIDE 0.9 % IV SOLN
1.0000 g | Freq: Once | INTRAVENOUS | Status: AC
  Administered 2022-10-01: 1 g via INTRAVENOUS
  Filled 2022-10-01: qty 10

## 2022-10-01 MED ORDER — FUROSEMIDE 10 MG/ML IJ SOLN
40.0000 mg | Freq: Once | INTRAMUSCULAR | Status: AC
  Administered 2022-10-01: 40 mg via INTRAVENOUS
  Filled 2022-10-01: qty 4

## 2022-10-01 MED ORDER — DIPHENHYDRAMINE HCL 50 MG/ML IJ SOLN: 25.0000 mg | Freq: Once | INTRAMUSCULAR | Status: DC | PRN

## 2022-10-01 NOTE — Progress Notes (Addendum)
Progress Note  Patient Name: [name] Date of Encounter: 10/01/2022  Primary Cardiologist: New to [facility] (this admission)  Subjective   Overnight no further pain. Patient notes that since her grandson passed she has had issues with orthopnea.  Inpatient Medications    Scheduled Meds:  allopurinol  100 mg Oral Daily   amLODipine  10 mg Oral Daily   atorvastatin  40 mg Oral Daily   Chlorhexidine Gluconate Cloth  6 each Topical Q0600   clopidogrel  75 mg Oral Daily   furosemide  40 mg Oral Daily   And   furosemide  20 mg Oral q1800   insulin aspart  0-5 Units Subcutaneous QHS   insulin aspart  0-9 Units Subcutaneous TID WC   metoprolol tartrate  25 mg Oral BID   Continuous Infusions:  [START ON 10/02/2022] cefTRIAXone (ROCEPHIN)  IV     doxycycline (VIBRAMYCIN) IV 100 mg (10/01/22 0144)   heparin 900 Units/hr (10/01/22 0144)   PRN Meds: acetaminophen **OR** acetaminophen, diphenhydrAMINE, ipratropium-albuterol, nitroGLYCERIN, ondansetron (ZOFRAN) IV, polyethylene glycol   Vital Signs    Vitals:   10/01/22 0500 10/01/22 0646 10/01/22 0700 10/01/22 0720  BP: (!) 132/45 (!) 144/64 (!) 130/59   Pulse: 67 68 66   Resp: (!) 23 20 19    Temp:    97.7 F (36.5 C)  TempSrc:    Oral  SpO2: 100% 100% 100%   Weight: 63.7 kg     Height:        Intake/Output Summary (Last 24 hours) at 10/01/2022 0802 Last data filed at 10/01/2022 0646 Gross per 24 hour  Intake 2001.68 ml  Output 250 ml  Net 1751.68 ml   Filed Weights   09/30/22 1241 09/30/22 1807 10/01/22 0500  Weight: 77.1 kg 63.5 kg 63.7 kg    Telemetry    SR with 1 run of SVT - Personally Reviewed  Physical Exam   Gen: no distress, elderly female   Cardiac: No Rubs or Gallops, no Murmur, RRR +2 radial pulses Respiratory: Inspiratory and expiratory wheezes, normal effort, normal  respiratory rate; weaned to 1 L on exam with sat 98% GI: Soft, nontender, non-distended  MS: non pitting left leg edema;   moves all extremities Integument: Skin feels warm, r leg wound is well dressed Psych: Normal affect, patient feels well   Labs    Chemistry Recent Labs  Lab 09/30/22 1336 10/01/22 0434  NA 136 137  K 3.7 4.2  CL 104 104  CO2 21* 22  GLUCOSE 104* 114*  BUN 43* 42*  CREATININE 1.66* 1.66*  CALCIUM 7.9* 8.0*  PROT 6.6  --   ALBUMIN 2.9*  --   AST 23  --   ALT 14  --   ALKPHOS 55  --   BILITOT 0.5  --   GFRNONAA 29* 29*  ANIONGAP 11 11     Hematology Recent Labs  Lab 09/30/22 1336 10/01/22 0434  WBC 8.3 10.7*  RBC 3.41* 3.60*  HGB 10.4* 10.8*  HCT 31.7* 33.5*  MCV 93.0 93.1  MCH 30.5 30.0  MCHC 32.8 32.2  RDW 15.1 15.0  PLT 316 359    Cardiac EnzymesNo results for input(s): "TROPONINI" in the last 168 hours. No results for input(s): "TROPIPOC" in the last 168 hours.   BNP Recent Labs  Lab 10/01/22 0434  BNP 3,147.0*     DDimer No results for input(s): "DDIMER" in the last 168 hours.   Radiology    DG  CHEST PORT 1 VIEW  Result Date: 09/30/2022 CLINICAL DATA:  Dyspnea. EXAM: PORTABLE CHEST 1 VIEW COMPARISON:  Earlier today FINDINGS: Stable cardiomegaly. Unchanged mediastinal contours. Development of pulmonary edema from earlier today. Increasing retrocardiac opacity and blunting of the costophrenic angle, likely combination of pleural effusion and atelectasis/airspace disease. No pneumothorax. Thoracic spondylosis and bilateral glenohumeral arthropathy. IMPRESSION: 1. Development of pulmonary edema from earlier today. 2. Increasing retrocardiac opacity and blunting of the costophrenic angle, likely combination of pleural effusion and atelectasis/airspace disease. Electronically Signed   By: Keith Rake M.D.   On: 09/30/2022 23:09   DG Chest Port 1 View  Result Date: 09/30/2022 CLINICAL DATA:  Indigestion. Chest discomfort radiating down the right arm. EXAM: PORTABLE CHEST 1 VIEW COMPARISON:  09/14/2019 FINDINGS: Apical lordotic projection. Mild  enlargement of the cardiopericardial silhouette. Indistinct obscuration left hemidiaphragm in the retrocardiac region, favored to represent atelectasis or pneumonia although a component of this appearance may be related to the apical lordotic projection obscuring the diaphragm. Substantial degenerative glenohumeral arthropathy bilaterally. Thoracic spondylosis. IMPRESSION: 1. Mild enlargement of the cardiopericardial silhouette, without edema. 2. Indistinct left hemidiaphragm in the retrocardiac region, favored to represent atelectasis or pneumonia. 3. Thoracic spondylosis. 4. Substantial degenerative glenohumeral arthropathy bilaterally. Electronically Signed   By: Van Clines M.D.   On: 09/30/2022 16:25     Patient Profile     89 y.o. female with history of dementia, HTN, and DM with symptomatic NSTEMI.  Planned for medical management  Assessment & Plan     NSTEMI with HTN and DM Complicated by Dementia - see prior consult not SDM for medical management - Currently asymptomatic - A decision was made not to do ASA trial as recommended in consultation given concerns of prior allergy continue plavix; - continue heparin for 48 hours - continue statin for now; based on DC GOC may not be needed - continue BB - will get echo; I suspect some underlying element of heart failure (her lasix we increased today); in talking with patient and granddaughter would consider transition to highest tolerate succinate and diuresis to near euvolemia; would not attempt aggressive GDMT titration  - will not plan for cardiac rehab  Paroxsymal SVT - continue BB    For questions or updates, please contact [facility] Please consult [email or web address] for contact info under Cardiology/STEMI.      Rudean Haskell, MD [address] [place], [place] [address] [phone]  8:02 AM   Echo showed HFrEF and evidence of LAD  infarct There is apical trabeculation without clear infarct. Will not purse cath in the setting of no CP and dementia Will transition to succinate Will increase IV diuretics Attempted to reach granddaughter Tabby. Discussed with Dr. Manuella Ghazi; PC to eval for long term planning  Rudean Haskell, MD [place]  [address] [place], [place] [address] [phone]  1:46 PM

## 2022-10-01 NOTE — Plan of Care (Signed)
  Problem: Education: Goal: Knowledge of General Education information will improve Description Including pain rating scale, medication(s)/side effects and non-pharmacologic comfort measures Outcome: Progressing   

## 2022-10-01 NOTE — Progress Notes (Addendum)
PROGRESS NOTE    [name]  [id number] DOB: March 27, 1931 DOA: 09/30/2022 PCP: Merryl Hacker, No   Brief Narrative:    [name] is a 89 y.o. female with medical history significant for diastolic CHF, diabetic foot, hypertension, psoriasis, dementia. Patient was brought to the ED from home with reports of severe chest pain that started yesterday, she reports feeling of indigestion, pain radiating down her right arm.  Patient was admitted with NSTEMI and started on heparin drip.  She is not wanting any interventional procedures and prefers medical management.  Cardiology consulted with plans to continue heparin drip for 48 hours and obtain 2D echocardiogram.  Assessment & Plan:   Principal Problem:   NSTEMI (non-ST elevated myocardial infarction) ([place]) Active Problems:   Chronic diastolic CHF (congestive heart failure) (HCC)   PNA (pneumonia)   Essential hypertension   DM (diabetes mellitus) ([place])   Dementia (HCC)  Assessment and Plan:    NSTEMI (non-ST elevated myocardial infarction) ([place]) Presents with chest pain, troponin elevated at 5547, with EKG with ST and T wave abnormalities in inferior and anterior leads.   -Appreciate cardiology recommendations to continue heparin for 48 hours -Continue statin, beta-blocker, and Plavix -2D echocardiogram ordered and pending   PNA (pneumonia) Re-evaluated later due to increasing dyspnea, now with diffuse wheeze and increased work of breathing. Chest xray - atelectasis or PNA. Not hypoxic. Never smoked cigarettes. Rules out for sepsis. -IV Rocephin and doxycycline   Chronic diastolic CHF (congestive heart failure) (HCC) Stable and compensated.  Last echo 04/2017, EF of 65 to 70% with grade 1DD. - 1L bolus given in ED -Continue daily Lasix   Dementia ([place]) Per family she is awake alert oriented x4, for the most part, but has frequent episodes of sundowning.   DM (diabetes mellitus) ([place]) - HgbA1c - SSI- S -Hold home metformin,  linagliptin for now   Essential hypertension Stable -Start metoprolol 25 mg twice daily -Carvedilol and Norvasc on home med list, resume norvasc in a.m    DVT prophylaxis:Heparin drip Code Status: DNR Family Communication: None at bedside Disposition Plan:  Status is: Observation The patient will require care spanning > 2 midnights and should be moved to inpatient because: Need for IV medications.   Consultants:  Cardiology  Procedures:  None  Antimicrobials:  Anti-infectives (From admission, onward)    Start     Dose/Rate Route Frequency Ordered Stop   10/02/22 0000  cefTRIAXone (ROCEPHIN) 1 g in sodium chloride 0.9 % 100 mL IVPB        1 g 200 mL/hr over 30 Minutes Intravenous Every 24 hours 10/01/22 0015     10/01/22 0115  cefTRIAXone (ROCEPHIN) 1 g in sodium chloride 0.9 % 100 mL IVPB        1 g 200 mL/hr over 30 Minutes Intravenous  Once 10/01/22 0015 10/01/22 0138   10/01/22 0115  doxycycline (VIBRAMYCIN) 100 mg in sodium chloride 0.9 % 250 mL IVPB        100 mg 125 mL/hr over 120 Minutes Intravenous Every 12 hours 10/01/22 0015         Subjective: Patient seen and evaluated today with no new acute complaints or concerns. No acute concerns or events noted overnight.  She denies any current chest pain or shortness of breath.  Objective: Vitals:   10/01/22 0900 10/01/22 1000 10/01/22 1100 10/01/22 1114  BP: (!) 136/49 (!) 146/65 (!) 139/53   Pulse: 74 76 73 72  Resp: (!) 42 (!) 23 (!)  21 20  Temp:    97.8 F (36.6 C)  TempSrc:    Oral  SpO2: 98% 97% 97% 96%  Weight:      Height:        Intake/Output Summary (Last 24 hours) at 10/01/2022 1129 Last data filed at 10/01/2022 1118 Gross per 24 hour  Intake 2266.95 ml  Output 700 ml  Net 1566.95 ml   Filed Weights   09/30/22 1241 09/30/22 1807 10/01/22 0500  Weight: 77.1 kg 63.5 kg 63.7 kg    Examination:  General exam: Appears calm and comfortable, elderly, hard of hearing Respiratory system: Clear  to auscultation. Respiratory effort normal. Cardiovascular system: S1 & S2 heard, RRR.  Gastrointestinal system: Abdomen is soft Central nervous system: Alert and awake Extremities: No edema Skin: No significant lesions noted Psychiatry: Flat affect.    Data Reviewed: I have personally reviewed following labs and imaging studies  CBC: Recent Labs  Lab 09/30/22 1336 10/01/22 0434  WBC 8.3 10.7*  NEUTROABS 6.5  --   HGB 10.4* 10.8*  HCT 31.7* 33.5*  MCV 93.0 93.1  PLT 316 [id number]   Basic Metabolic Panel: Recent Labs  Lab 09/30/22 1336 10/01/22 0434  NA 136 137  K 3.7 4.2  CL 104 104  CO2 21* 22  GLUCOSE 104* 114*  BUN 43* 42*  CREATININE 1.66* 1.66*  CALCIUM 7.9* 8.0*   GFR: Estimated Creatinine Clearance: 20.6 mL/min (A) (by C-G formula based on SCr of 1.66 mg/dL (H)). Liver Function Tests: Recent Labs  Lab 09/30/22 1336  AST 23  ALT 14  ALKPHOS 55  BILITOT 0.5  PROT 6.6  ALBUMIN 2.9*   No results for input(s): "LIPASE", "AMYLASE" in the last 168 hours. No results for input(s): "AMMONIA" in the last 168 hours. Coagulation Profile: Recent Labs  Lab 09/30/22 1336  INR 1.1   Cardiac Enzymes: No results for input(s): "CKTOTAL", "CKMB", "CKMBINDEX", "TROPONINI" in the last 168 hours. BNP (last 3 results) No results for input(s): "PROBNP" in the last 8760 hours. HbA1C: No results for input(s): "HGBA1C" in the last 72 hours. CBG: Recent Labs  Lab 09/30/22 2247 10/01/22 0718 10/01/22 1113  GLUCAP 218* 103* 123*   Lipid Profile: No results for input(s): "CHOL", "HDL", "LDLCALC", "TRIG", "CHOLHDL", "LDLDIRECT" in the last 72 hours. Thyroid Function Tests: No results for input(s): "TSH", "T4TOTAL", "FREET4", "T3FREE", "THYROIDAB" in the last 72 hours. Anemia Panel: No results for input(s): "VITAMINB12", "FOLATE", "FERRITIN", "TIBC", "IRON", "RETICCTPCT" in the last 72 hours. Sepsis Labs: No results for input(s): "PROCALCITON", "LATICACIDVEN" in the last  168 hours.  Recent Results (from the past 240 hour(s))  MRSA Next Gen by PCR, Nasal     Status: None   Collection Time: 09/30/22  6:17 PM   Specimen: Nasal Mucosa; Nasal Swab  Result Value Ref Range Status   MRSA by PCR Next Gen NOT DETECTED NOT DETECTED Final    Comment: (NOTE) The GeneXpert MRSA Assay (FDA approved for NASAL specimens only), is one component of a comprehensive MRSA colonization surveillance program. It is not intended to diagnose MRSA infection nor to guide or monitor treatment for MRSA infections. Test performance is not FDA approved in patients less than 88 years old. Performed at [facility], [address] [place], [place] [address]          Radiology Studies: DG CHEST PORT 1 VIEW  Result Date: 09/30/2022 CLINICAL DATA:  Dyspnea. EXAM: PORTABLE CHEST 1 VIEW COMPARISON:  Earlier today FINDINGS: Stable cardiomegaly. Unchanged mediastinal  contours. Development of pulmonary edema from earlier today. Increasing retrocardiac opacity and blunting of the costophrenic angle, likely combination of pleural effusion and atelectasis/airspace disease. No pneumothorax. Thoracic spondylosis and bilateral glenohumeral arthropathy. IMPRESSION: 1. Development of pulmonary edema from earlier today. 2. Increasing retrocardiac opacity and blunting of the costophrenic angle, likely combination of pleural effusion and atelectasis/airspace disease. Electronically Signed   By: Keith Rake M.D.   On: 09/30/2022 23:09   DG Chest Port 1 View  Result Date: 09/30/2022 CLINICAL DATA:  Indigestion. Chest discomfort radiating down the right arm. EXAM: PORTABLE CHEST 1 VIEW COMPARISON:  09/14/2019 FINDINGS: Apical lordotic projection. Mild enlargement of the cardiopericardial silhouette. Indistinct obscuration left hemidiaphragm in the retrocardiac region, favored to represent atelectasis or pneumonia although a component of this appearance may be related to the apical lordotic projection  obscuring the diaphragm. Substantial degenerative glenohumeral arthropathy bilaterally. Thoracic spondylosis. IMPRESSION: 1. Mild enlargement of the cardiopericardial silhouette, without edema. 2. Indistinct left hemidiaphragm in the retrocardiac region, favored to represent atelectasis or pneumonia. 3. Thoracic spondylosis. 4. Substantial degenerative glenohumeral arthropathy bilaterally. Electronically Signed   By: Van Clines M.D.   On: 09/30/2022 16:25        Scheduled Meds:  allopurinol  100 mg Oral Daily   amLODipine  10 mg Oral Daily   atorvastatin  40 mg Oral Daily   Chlorhexidine Gluconate Cloth  6 each Topical Q0600   clopidogrel  75 mg Oral Daily   furosemide  40 mg Oral Daily   And   furosemide  20 mg Oral q1800   insulin aspart  0-5 Units Subcutaneous QHS   insulin aspart  0-9 Units Subcutaneous TID WC   metoprolol tartrate  25 mg Oral BID   Continuous Infusions:  [START ON 10/02/2022] cefTRIAXone (ROCEPHIN)  IV     doxycycline (VIBRAMYCIN) IV 100 mg (10/01/22 0920)   heparin 900 Units/hr (10/01/22 0144)     LOS: 0 days    Time spent: 35 minutes    Kerri Smyre Darleen Crocker, DO [facility]  If 7PM-7AM, please contact night-coverage [email or web address] 10/01/2022, 11:29 AM

## 2022-10-01 NOTE — Progress Notes (Signed)
ANTICOAGULATION CONSULT NOTE  Pharmacy Consult for Heparin Indication: chest pain/ACS  Allergies  Allergen Reactions   Ace Inhibitors Dermatitis   Aspirin    Glipizide     rash   Neosporin [Neomycin-Bacitracin Zn-Polymyx] Dermatitis   Spironolactone     rash   Diovan [Valsartan] Rash   Keflex [Cephalexin] Rash    Patient Measurements: Height: 5\' 3"  (160 cm) Weight: 63.7 kg (140 lb 6.9 oz) IBW/kg (Calculated) : 52.4 HEPARIN DW (KG): 63.5   Vital Signs: Temp: 97.7 F (36.5 C) (03/20 0720) Temp Source: Oral (03/20 0720) BP: 130/59 (03/20 0700) Pulse Rate: 66 (03/20 0700)  Labs: Recent Labs    09/30/22 1336 09/30/22 1534 09/30/22 2056 09/30/22 2248 10/01/22 0434 10/01/22 0945  HGB 10.4*  --   --   --  10.8*  --   HCT 31.7*  --   --   --  33.5*  --   PLT 316  --   --   --  359  --   LABPROT 14.4  --   --   --   --   --   INR 1.1  --   --   --   --   --   HEPARINUNFRC  --   --   --  0.36  --  0.39  CREATININE 1.66*  --   --   --  1.66*  --   TROPONINIHS 5,547* 5,816* 5,775*  --   --   --      Estimated Creatinine Clearance: 20.6 mL/min (A) (by C-G formula based on SCr of 1.66 mg/dL (H)).   Medical History: Past Medical History:  Diagnosis Date   Anxiety    Arthritis    Cancer ([place]) 2006   colon   Cataract    CHF (congestive heart failure) ([place]) 05/2016   preserved EF, grade 2 diastolic dysfunction   Diabetes mellitus without complication (HCC)    Diverticulitis    Erythroderma desquamativum    Hypertension     Assessment: Patient presented to ED with intermittent chest pain an discomfort radiating down right arm. Elevated troponin. Patient not on oral anticoagulants prior to admission. Pharmacy asked to start heparin.   Heparin level continues to be stable and at goal on recheck this morning. No bleeding or IV issues noted. CBC stable overnight.   Goal of Therapy:  Heparin level 0.3-0.7 units/ml Monitor platelets by anticoagulation protocol:  Yes   Plan:  Continue heparin infusion at 850 units/hr Check anti-Xa level daily while on heparin Continue to monitor H&H and platelets  Erin Hearing PharmD., BCPS Clinical Pharmacist 10/01/2022 10:34 AM

## 2022-10-01 NOTE — Progress Notes (Signed)
ANTICOAGULATION CONSULT NOTE - Follow Up Consult  Pharmacy Consult for heparin Indication:  NSTEMI  Labs: Recent Labs    09/30/22 1336 09/30/22 1534 09/30/22 2056 09/30/22 2248  HGB 10.4*  --   --   --   HCT 31.7*  --   --   --   PLT 316  --   --   --   LABPROT 14.4  --   --   --   INR 1.1  --   --   --   HEPARINUNFRC  --   --   --  0.36  CREATININE 1.66*  --   --   --   TROPONINIHS 5,547* 5,816* 5,775*  --     Assessment: 89yo female therapeutic on heparin with initial dosing for medical mgmt of NSTEMI but at low end of goal; no infusion issues or signs of bleeding per RN.  Goal of Therapy:  Heparin level 0.3-0.7 units/ml   Plan:  Will increase heparin infusion slightly to 900 units/hr and check level in 8 hours.    Wynona Neat, PharmD, BCPS  10/01/2022,1:35 AM

## 2022-10-01 NOTE — Progress Notes (Signed)
  Echocardiogram 2D Echocardiogram has been performed.  [name] 10/01/2022, 1:37 PM

## 2022-10-01 NOTE — Progress Notes (Signed)
Patient transferred to room 308, family aware. Report called to [facility] LPN.   Adis Sturgill, Tivis Ringer, RN

## 2022-10-01 NOTE — Care Management Obs Status (Signed)
[facility] NOTIFICATION   Patient Details  Name: [name] MRN: [id number] Date of Birth: 06-22-1931   Medicare Observation Status Notification Given:  Yes    Ihor Gully, LCSW 10/01/2022, 11:38 AM

## 2022-10-01 NOTE — TOC Initial Note (Signed)
Transition of Care [facility]) - Initial/Assessment Note    Patient Details  Name: [name] MRN: [id number] Date of Birth: 12/09/1930  [facility] of [facility]) CM/SW Contact:    Ihor Gully, LCSW Phone Number: 10/01/2022, 11:31 AM  Clinical Narrative:                 Patient from [facility], has been a resident for the past 2-3 years. Admitted for NSTEMI. Plan is to return to the facility at d/c.   Expected Discharge Plan: Assisted Living Barriers to Discharge: Continued Medical Work up   Patient Goals and CMS Choice Patient states their goals for this hospitalization and ongoing recovery are:: return to [facility]          Expected Discharge Plan and Services       Living arrangements for the past 2 months: Assisted Living Facility                                      Prior Living Arrangements/Services Living arrangements for the past 2 months: [place] Lives with:: Facility Resident Patient language and need for interpreter reviewed:: Yes        Need for Family Participation in Patient Care: Yes (Comment) Care giver support system in place?: Yes (comment) Current home services: DME (uses walker) Criminal Activity/Legal Involvement Pertinent to Current Situation/Hospitalization: No - Comment as needed  Activities of Daily Living Home Assistive Devices/Equipment: CBG Meter, Walker (specify type) ADL Screening (condition at time of admission) Patient's cognitive ability adequate to safely complete daily activities?: No Is the patient deaf or have difficulty hearing?: Yes Does the patient have difficulty seeing, even when wearing glasses/contacts?: No Does the patient have difficulty concentrating, remembering, or making decisions?: Yes Patient able to express need for assistance with ADLs?: Yes Does the patient have difficulty dressing or bathing?: No Independently performs ADLs?: No Communication: Independent Dressing (OT):  Independent Grooming: Independent Feeding: Independent Bathing: Needs assistance Is this a change from baseline?: Pre-admission baseline Toileting: Needs assistance Is this a change from baseline?: Pre-admission baseline In/Out Bed: Needs assistance Is this a change from baseline?: Pre-admission baseline Walks in Home: Needs assistance Is this a change from baseline?: Pre-admission baseline Does the patient have difficulty walking or climbing stairs?: No Weakness of Legs: None Weakness of Arms/Hands: None  Permission Sought/Granted Permission sought to share information with : Family Supports    Share Information with NAME: granddaughter, [name]; daughter, June           Emotional Assessment       Orientation: : Oriented to Self Alcohol / Substance Use: Not Applicable Psych Involvement: No (comment)  Admission diagnosis:  NSTEMI (non-ST elevated myocardial infarction) [facility]) [I21.4] Patient Active Problem List   Diagnosis Date Noted   NSTEMI (non-ST elevated myocardial infarction) ([name]) 09/30/2022   DM (diabetes mellitus) ([place]) 09/30/2022   Dementia ([place]) 09/30/2022   Bilateral primary osteoarthritis of knee 06/13/2020   PNA (pneumonia)    UTI (urinary tract infection) 09/14/2019   Pneumonia due to COVID-19 virus 07/22/2019   Acute on chronic diastolic CHF (congestive heart failure) ([place]) 05/06/2017   Altered mental status    Acute respiratory failure ([place]) 05/04/2017   Hypoglycemia 04/30/2017   Acute urinary retention 02/23/2017   Chronic diastolic CHF (congestive heart failure) ([place])    SBO (small bowel obstruction) ([place]) 02/22/2017   Hypertensive urgency 02/22/2017   Leukocytosis  02/22/2017   Hyponatremia 02/22/2017   Dehydration 02/22/2017   Dizziness 09/01/2016   Weakness 09/01/2016   Psoriasis vulgaris 09/01/2016   Right hip pain 09/01/2016   Diabetic foot ulcer ([place]) 09/01/2016   Congestive heart failure (CHF) ([place]) 05/15/2016   Rash and nonspecific  skin eruption 05/15/2016   Essential hypertension 05/15/2016   Uncontrolled type 2 diabetes mellitus with hyperglycemia ([place]) 05/15/2016   Anxiety 05/15/2016   Acute respiratory failure with hypoxia ([place]) 05/15/2016   PCP:  Pcp, No Pharmacy:   [id number] - [place], [place] [address] Phone: [phone] Fax: [phone]  [id number] - [place], [place] [id number] [place] [id number] Phone: [phone] Fax: [phone]  [place], [place] [id number] [place] [id number] Phone: [phone] Fax: [phone]     Social Determinants of Health (SDOH) Social History: [place]: No Food Insecurity (09/30/2022)  Housing: Low Risk  (09/30/2022)  Transportation Needs: No Transportation Needs (09/30/2022)  Utilities: Not At Risk (09/30/2022)  Tobacco Use: Low Risk  (09/30/2022)   SDOH Interventions:     Readmission Risk Interventions     No data to display

## 2022-10-02 ENCOUNTER — Encounter (HOSPITAL_COMMUNITY): Payer: Self-pay | Admitting: Internal Medicine

## 2022-10-02 DIAGNOSIS — Z515 Encounter for palliative care: Secondary | ICD-10-CM | POA: Diagnosis not present

## 2022-10-02 DIAGNOSIS — I214 Non-ST elevation (NSTEMI) myocardial infarction: Secondary | ICD-10-CM | POA: Diagnosis not present

## 2022-10-02 DIAGNOSIS — Z7189 Other specified counseling: Secondary | ICD-10-CM

## 2022-10-02 DIAGNOSIS — I5021 Acute systolic (congestive) heart failure: Secondary | ICD-10-CM | POA: Diagnosis not present

## 2022-10-02 LAB — CBC
HCT: 34.7 % — ABNORMAL LOW (ref 36.0–46.0)
Hemoglobin: 10.9 g/dL — ABNORMAL LOW (ref 12.0–15.0)
MCH: 29.8 pg (ref 26.0–34.0)
MCHC: 31.4 g/dL (ref 30.0–36.0)
MCV: 94.8 fL (ref 80.0–100.0)
Platelets: 426 10*3/uL — ABNORMAL HIGH (ref 150–400)
RBC: 3.66 MIL/uL — ABNORMAL LOW (ref 3.87–5.11)
RDW: 15.2 % (ref 11.5–15.5)
WBC: 12.3 10*3/uL — ABNORMAL HIGH (ref 4.0–10.5)
nRBC: 0 % (ref 0.0–0.2)

## 2022-10-02 LAB — BASIC METABOLIC PANEL
Anion gap: 13 (ref 5–15)
BUN: 43 mg/dL — ABNORMAL HIGH (ref 8–23)
CO2: 20 mmol/L — ABNORMAL LOW (ref 22–32)
Calcium: 8.2 mg/dL — ABNORMAL LOW (ref 8.9–10.3)
Chloride: 104 mmol/L (ref 98–111)
Creatinine, Ser: 1.75 mg/dL — ABNORMAL HIGH (ref 0.44–1.00)
GFR, Estimated: 28 mL/min — ABNORMAL LOW (ref >60)
Glucose, Bld: 241 mg/dL — ABNORMAL HIGH (ref 70–99)
Potassium: 3.5 mmol/L (ref 3.5–5.1)
Sodium: 137 mmol/L (ref 135–145)

## 2022-10-02 LAB — GLUCOSE, CAPILLARY
Glucose-Capillary: 217 mg/dL — ABNORMAL HIGH (ref 70–99)
Glucose-Capillary: 115 mg/dL — ABNORMAL HIGH (ref 70–99)
Glucose-Capillary: 99 mg/dL (ref 70–99)
Glucose-Capillary: 129 mg/dL — ABNORMAL HIGH (ref 70–99)

## 2022-10-02 LAB — MAGNESIUM: Magnesium: 2.2 mg/dL (ref 1.7–2.4)

## 2022-10-02 LAB — HEPARIN LEVEL (UNFRACTIONATED): Heparin Unfractionated: 0.29 IU/mL — ABNORMAL LOW (ref 0.30–0.70)

## 2022-10-02 MED ORDER — ALPRAZOLAM 0.25 MG PO TABS
0.2500 mg | ORAL_TABLET | Freq: Two times a day (BID) | ORAL | Status: DC | PRN
  Administered 2022-10-02 (×2): 0.25 mg via ORAL
  Filled 2022-10-02 (×2): qty 1

## 2022-10-02 MED ORDER — FUROSEMIDE 10 MG/ML IJ SOLN
20.0000 mg | Freq: Once | INTRAMUSCULAR | Status: AC
  Administered 2022-10-02: 20 mg via INTRAVENOUS
  Filled 2022-10-02: qty 2

## 2022-10-02 MED ORDER — FUROSEMIDE 10 MG/ML IJ SOLN
60.0000 mg | Freq: Two times a day (BID) | INTRAMUSCULAR | Status: DC
  Administered 2022-10-02 – 2022-10-04 (×4): 60 mg via INTRAVENOUS
  Filled 2022-10-02 (×4): qty 6

## 2022-10-02 NOTE — Progress Notes (Signed)
Rounding Note    Patient Name: CELESTE GONG Date of Encounter: 10/02/2022  [place] Cardiologist: New  Subjective   No chest pain, some ongoing SOB  Inpatient Medications    Scheduled Meds:  allopurinol  100 mg Oral Daily   amLODipine  10 mg Oral Daily   atorvastatin  40 mg Oral Daily   clopidogrel  75 mg Oral Daily   furosemide  40 mg Intravenous BID   insulin aspart  0-5 Units Subcutaneous QHS   insulin aspart  0-9 Units Subcutaneous TID WC   metoprolol succinate  25 mg Oral Daily   Continuous Infusions:  cefTRIAXone (ROCEPHIN)  IV 1 g (10/01/22 2326)   doxycycline (VIBRAMYCIN) IV 100 mg (10/01/22 2057)   heparin 1,000 Units/hr (10/02/22 0833)   PRN Meds: acetaminophen **OR** acetaminophen, ALPRAZolam, diphenhydrAMINE, ipratropium-albuterol, nitroGLYCERIN, ondansetron (ZOFRAN) IV, polyethylene glycol   Vital Signs    Vitals:   10/02/22 0441 10/02/22 0442 10/02/22 0742 10/02/22 0922  BP: (!) 143/83  (!) 148/73 91/68  Pulse: 94  89 81  Resp: 20  (!) 22 20  Temp: 97.6 F (36.4 C)     TempSrc: Oral  Oral   SpO2: 92% (!) 86% 94%   Weight:      Height:        Intake/Output Summary (Last 24 hours) at 10/02/2022 1005 Last data filed at 10/02/2022 0844 Gross per 24 hour  Intake 922.76 ml  Output 350 ml  Net 572.76 ml      10/01/2022    5:00 AM 09/30/2022    6:07 PM 09/30/2022   12:41 PM  Last 3 Weights  Weight (lbs) 140 lb 6.9 oz 139 lb 15.9 oz 170 lb  Weight (kg) 63.7 kg 63.5 kg 77.111 kg      Telemetry    SR - Personally Reviewed  ECG    N/a - Personally Reviewed  Physical Exam   GEN: No acute distress.   Neck: elevated JVD Cardiac: RRR, no murmurs, rubs, or gallops.  Respiratory: Crackles bilateral bases. GI: Soft, nontender, non-distended  MS: No edema; No deformity. Neuro:  Nonfocal  Psych: Normal affect   Labs    High Sensitivity Troponin:   Recent Labs  Lab 09/30/22 1336 09/30/22 1534 09/30/22 2056  TROPONINIHS  5,547* 5,816* 5,775*     Chemistry Recent Labs  Lab 09/30/22 1336 10/01/22 0434 10/02/22 0411  NA 136 137 137  K 3.7 4.2 3.5  CL 104 104 104  CO2 21* 22 20*  GLUCOSE 104* 114* 241*  BUN 43* 42* 43*  CREATININE 1.66* 1.66* 1.75*  CALCIUM 7.9* 8.0* 8.2*  MG  --   --  2.2  PROT 6.6  --   --   ALBUMIN 2.9*  --   --   AST 23  --   --   ALT 14  --   --   ALKPHOS 55  --   --   BILITOT 0.5  --   --   GFRNONAA 29* 29* 28*  ANIONGAP 11 11 13     Lipids No results for input(s): "CHOL", "TRIG", "HDL", "LABVLDL", "LDLCALC", "CHOLHDL" in the last 168 hours.  Hematology Recent Labs  Lab 09/30/22 1336 10/01/22 0434 10/02/22 0411  WBC 8.3 10.7* 12.3*  RBC 3.41* 3.60* 3.66*  HGB 10.4* 10.8* 10.9*  HCT 31.7* 33.5* 34.7*  MCV 93.0 93.1 94.8  MCH 30.5 30.0 29.8  MCHC 32.8 32.2 31.4  RDW 15.1 15.0 15.2  PLT 316 359  426*   Thyroid No results for input(s): "TSH", "FREET4" in the last 168 hours.  BNP Recent Labs  Lab 10/01/22 0434  BNP 3,147.0*    DDimer No results for input(s): "DDIMER" in the last 168 hours.   Radiology    ECHOCARDIOGRAM COMPLETE  Result Date: 10/01/2022    ECHOCARDIOGRAM REPORT   Patient Name:   SHANISHA LECH Date of Exam: 10/01/2022 Medical Rec #:  [id number]     Height:       63.0 in Accession #:    [id number]    Weight:       140.4 lb Date of Birth:  13-Jun-1931     BSA:          1.664 m Patient Age:    89 years      BP:           142/62 mmHg Patient Gender: F             HR:           69 bpm. Exam Location:  [facility] Procedure: 2D Echo, 3D Echo, Cardiac Doppler, Intracardiac Opacification Agent            and Strain Analysis Indications:    NSTEMI I21.4  History:        Patient has no prior history of Echocardiogram examinations.                 CHF, NSTEMI, Signs/Symptoms:Altered Mental Status; Risk                 Factors:Hypertension, Diabetes and Non-Smoker.  Sonographer:    Greer Pickerel Referring Phys: [id number] [facility] A Gasper Sells  Sonographer Comments:  Image acquisition challenging due to patient body habitus and Image acquisition challenging due to respiratory motion. Global longitudinal strain was attempted. IMPRESSIONS  1. Left ventricular ejection fraction, by estimation, is 35 to 40%. The left ventricle has moderately decreased function. The left ventricle demonstrates regional wall motion abnormalities (study consistent with LAD infarct.). There is mild concentric left ventricular hypertrophy. Left ventricular diastolic parameters are consistent with Grade II diastolic dysfunction (pseudonormalization). There is the interventricular septum is flattened in systole and diastole, consistent with right ventricular pressure and volume overload.  2. Right ventricular systolic function is normal. The right ventricular size is normal. There is mildly elevated pulmonary artery systolic pressure. The estimated right ventricular systolic pressure is 41.9 mmHg.  3. Left atrial size was mildly dilated.  4. Moderate pericardial effusion.  5. The mitral valve is grossly normal. Mild to moderate mitral valve regurgitation.  6. Tricuspid valve regurgitation is moderate.  7. The aortic valve is tricuspid. Aortic valve regurgitation is trivial. Aortic valve sclerosis is present, with no evidence of aortic valve stenosis.  8. The inferior vena cava is normal in size with <50% respiratory variability, suggesting right atrial pressure of 8 mmHg. Comparison(s): No prior Echocardiogram. FINDINGS  Left Ventricle: Left ventricular ejection fraction, by estimation, is 35 to 40%. The left ventricle has moderately decreased function. The left ventricle demonstrates regional wall motion abnormalities. Definity contrast agent was given IV to delineate the left ventricular endocardial borders. The average left ventricular global longitudinal strain is -10.0 %. The global longitudinal strain is abnormal. 3D ejection fraction reviewed and evaluated as part of the interpretation. Alternate  measurement of EF is felt to be most reflective of LV function. The left ventricular internal cavity size was normal in size. There is mild concentric left ventricular hypertrophy. The interventricular septum  is flattened in systole and diastole, consistent with right ventricular pressure and volume overload. Left ventricular diastolic parameters are consistent with Grade II diastolic dysfunction (pseudonormalization).  LV Wall Scoring: The mid and distal anterior septum, inferior septum, apical lateral segment, apical anterior segment, and apical inferior segment are akinetic. The inferior wall, basal anteroseptal segment, and mid anterior segment are hypokinetic. Right Ventricle: The right ventricular size is normal. No increase in right ventricular wall thickness. Right ventricular systolic function is normal. There is mildly elevated pulmonary artery systolic pressure. The tricuspid regurgitant velocity is 2.82  m/s, and with an assumed right atrial pressure of 8 mmHg, the estimated right ventricular systolic pressure is [id number] mmHg. Left Atrium: Left atrial size was mildly dilated. Right Atrium: Right atrial size was normal in size. Pericardium: A moderately sized pericardial effusion is present. Mitral Valve: The mitral valve is grossly normal. Mild to moderate mitral valve regurgitation. Tricuspid Valve: The tricuspid valve is normal in structure. Tricuspid valve regurgitation is moderate. Aortic Valve: The aortic valve is tricuspid. Aortic valve regurgitation is trivial. Aortic valve sclerosis is present, with no evidence of aortic valve stenosis. Pulmonic Valve: The pulmonic valve was not well visualized. Pulmonic valve regurgitation is not visualized. Aorta: The aortic root and ascending aorta are structurally normal, with no evidence of dilitation. Venous: The inferior vena cava is normal in size with less than 50% respiratory variability, suggesting right atrial pressure of 8 mmHg. IAS/Shunts: No atrial  level shunt detected by color flow Doppler.  LEFT VENTRICLE PLAX 2D LVIDd:         4.60 cm   Diastology LVIDs:         3.40 cm   LV e' medial:    4.91 cm/s LV PW:         1.10 cm   LV E/e' medial:  22.8 LV IVS:        1.10 cm   LV e' lateral:   6.13 cm/s LVOT diam:     1.80 cm   LV E/e' lateral: 18.3 LV SV:         55 LV SV Index:   33        2D Longitudinal Strain LVOT Area:     2.54 cm  2D Strain GLS Avg:     -10.0 %                           3D Volume EF:                          3D EF:        46 %                          LV EDV:       143 ml                          LV ESV:       77 ml                          LV SV:        66 ml RIGHT VENTRICLE RV S prime:     9.67 cm/s TAPSE (M-mode): 1.9 cm LEFT ATRIUM             Index  RIGHT ATRIUM           Index LA diam:        3.90 cm 2.34 cm/m   RA Area:     14.20 cm LA Vol (A2C):   67.2 ml 40.39 ml/m  RA Volume:   36.40 ml  21.88 ml/m LA Vol (A4C):   58.8 ml 35.34 ml/m LA Biplane Vol: 64.8 ml 38.95 ml/m  AORTIC VALVE LVOT Vmax:   94.80 cm/s LVOT Vmean:  64.800 cm/s LVOT VTI:    0.218 m  AORTA Ao Root diam: 3.40 cm Ao Asc diam:  3.40 cm MITRAL VALVE                TRICUSPID VALVE MV Area (PHT): 4.60 cm     TR Peak grad:   31.8 mmHg MV Decel Time: 165 msec     TR Vmax:        282.00 cm/s MR Peak grad: 58.7 mmHg MR Vmax:      383.00 cm/s   SHUNTS MV E velocity: 112.00 cm/s  Systemic VTI:  0.22 m MV A velocity: 116.00 cm/s  Systemic Diam: 1.80 cm MV E/A ratio:  0.97 Rudean Haskell MD Electronically signed by Rudean Haskell MD Signature Date/Time: 10/01/2022/1:41:38 PM    Final    DG CHEST PORT 1 VIEW  Result Date: 09/30/2022 CLINICAL DATA:  Dyspnea. EXAM: PORTABLE CHEST 1 VIEW COMPARISON:  Earlier today FINDINGS: Stable cardiomegaly. Unchanged mediastinal contours. Development of pulmonary edema from earlier today. Increasing retrocardiac opacity and blunting of the costophrenic angle, likely combination of pleural effusion and  atelectasis/airspace disease. No pneumothorax. Thoracic spondylosis and bilateral glenohumeral arthropathy. IMPRESSION: 1. Development of pulmonary edema from earlier today. 2. Increasing retrocardiac opacity and blunting of the costophrenic angle, likely combination of pleural effusion and atelectasis/airspace disease. Electronically Signed   By: Keith Rake M.D.   On: 09/30/2022 23:09   DG Chest Port 1 View  Result Date: 09/30/2022 CLINICAL DATA:  Indigestion. Chest discomfort radiating down the right arm. EXAM: PORTABLE CHEST 1 VIEW COMPARISON:  09/14/2019 FINDINGS: Apical lordotic projection. Mild enlargement of the cardiopericardial silhouette. Indistinct obscuration left hemidiaphragm in the retrocardiac region, favored to represent atelectasis or pneumonia although a component of this appearance may be related to the apical lordotic projection obscuring the diaphragm. Substantial degenerative glenohumeral arthropathy bilaterally. Thoracic spondylosis. IMPRESSION: 1. Mild enlargement of the cardiopericardial silhouette, without edema. 2. Indistinct left hemidiaphragm in the retrocardiac region, favored to represent atelectasis or pneumonia. 3. Thoracic spondylosis. 4. Substantial degenerative glenohumeral arthropathy bilaterally. Electronically Signed   By: Van Clines M.D.   On: 09/30/2022 16:25    Cardiac Studies     Patient Profile     89 y.o. female with history of dementia, HTN, and DM with symptomatic NSTEMI.  Planned for medical management   Assessment & Plan    1.NSTEMI - plans for medical management due to advanced age, dementia - peak trop 5816, EKG anteroseptal Qwaves, lateral and inferior ST depressions - echo LVEF 35-40%, grade II dd, The mid and distal anterior septum, inferior septum, apical lateral segment, apical anterior segment, and apical inferior segment are akinetic. The inferior wall, basal anteroseptal segment, and mid anterior segment are hypokinetic  Dshaped septum, mod pericardial effusion  -medical therapy with atorva 40, plavix 75, toprol 25, hep gtt. ASA allergy.  Will complete 48 hrs of heparin this afternoon  2.Acute HFrEF - echo LVEF 35-40%, grade II dd - CXR with pulm edema, BNP 3147  -  on IV lasix 40mg  bid, From I/Os +22 yesterday and +1.7L this admission. Mild uptrend in Cr. Remains fluid overloaded with high O2 requirement, increase lasix to 60mg  bid and accept higher Cr for now.  -check reds vest  - medical therapy with toprol 25mg  daily. Advanced age and GFR 28, would avoid ACE/ARB/ARNI/MRA/SGLT2i at this time. Soft bp's, no hydral/nitrates at this time  2.PSVT -continue toprol  3. HTN - soft bp's today, stop norvasc  For questions or updates, please contact [place] Please consult [email or web address] for contact info under        Signed, Carlyle Dolly, MD  10/02/2022, 10:05 AM

## 2022-10-02 NOTE — Progress Notes (Addendum)
ANTICOAGULATION CONSULT NOTE  Pharmacy Consult for Heparin Indication: chest pain/ACS  Allergies  Allergen Reactions   Ace Inhibitors Dermatitis   Aspirin    Glipizide     rash   Neosporin [Neomycin-Bacitracin Zn-Polymyx] Dermatitis   Spironolactone     rash   Diovan [Valsartan] Rash   Keflex [Cephalexin] Rash    Patient Measurements: Height: 5\' 3"  (160 cm) Weight: 63.7 kg (140 lb 6.9 oz) IBW/kg (Calculated) : 52.4 HEPARIN DW (KG): 63.5   Vital Signs: Temp: 97.6 F (36.4 C) (03/21 0441) Temp Source: Oral (03/21 0742) BP: 148/73 (03/21 0742) Pulse Rate: 89 (03/21 0742)  Labs: Recent Labs    09/30/22 1336 09/30/22 1534 09/30/22 2056 09/30/22 2248 10/01/22 0434 10/01/22 0945 10/02/22 0411  HGB 10.4*  --   --   --  10.8*  --  10.9*  HCT 31.7*  --   --   --  33.5*  --  34.7*  PLT 316  --   --   --  359  --  426*  LABPROT 14.4  --   --   --   --   --   --   INR 1.1  --   --   --   --   --   --   HEPARINUNFRC  --   --   --  0.36  --  0.39 0.29*  CREATININE 1.66*  --   --   --  1.66*  --  1.75*  TROPONINIHS 5,547* 5,816* 5,775*  --   --   --   --      Estimated Creatinine Clearance: 19.6 mL/min (A) (by C-G formula based on SCr of 1.75 mg/dL (H)).   Medical History: Past Medical History:  Diagnosis Date   Anxiety    Arthritis    Cancer ([place]) 2006   colon   Cataract    CHF (congestive heart failure) ([place]) 05/2016   preserved EF, grade 2 diastolic dysfunction   Diabetes mellitus without complication ([facility])    Diverticulitis    Erythroderma desquamativum    Hypertension     Assessment: Patient presented to ED with intermittent chest pain an discomfort radiating down right arm. Elevated troponin. Patient not on oral anticoagulants prior to admission. Pharmacy asked to start heparin.   HL 0.29- slightly subtherapeutic  Goal of Therapy:  Heparin level 0.3-0.7 units/ml Monitor platelets by anticoagulation protocol: Yes   Plan:  Increase heparin infusion  to 1000 units/hr Check anti-Xa level daily while on heparin Continue to monitor H&H and platelets  Margot Ables, PharmD Clinical Pharmacist 10/02/2022 7:53 AM

## 2022-10-02 NOTE — Progress Notes (Signed)
Nurse called for patient to have PRN treatment, patient in respiratory distress with audible wheezing and fine crackles. Patients spo2 didn't increase with treatment nor did rr decrease o2 at 3lp 85% treatment running to 8lpm cann spo2 87% o2 changed to 5lpm salter HFNC 90 increased to 7lpm

## 2022-10-02 NOTE — Consult Note (Signed)
Consultation Note Date: 10/02/2022   Patient Name: [name]  DOB: April 10, 1931  MRN: [id number]  Age / Sex: 89 y.o., female  PCP: Pcp, No Referring Physician: Rodena Goldmann, DO  Reason for Consultation: Establishing goals of care  HPI/Patient Profile: 89 y.o. female  with past medical history of dementia but alert and oriented x 4 frequently, with sundowning, HTN, diastolic heart failure, diverticulitis, DM, psoriasis, history of colon cancer in 2006 with colon surgery, from home, admitted on 09/30/2022 with NSTEMI.   Clinical Assessment and Goals of Care: I have reviewed medical records including EPIC notes, labs and imaging, received report from RN, assessed the patient.  [name] is sitting up quietly in bed.  She greets me, making and keeping eye contact.  She appears elderly and pale, but in no acute distress.  She is alert and oriented x 3, but with known dementia (known to have sundowning).  She is able to make her needs known.  There is no family at bedside at this time.    We meet at bedside to discuss diagnosis prognosis, GOC, EOL wishes, disposition and options.  I briefly introduced Palliative Medicine as specialized medical care for people living with serious illness. It focuses on providing relief from the symptoms and stress of a serious illness. The goal is to improve quality of life for both the patient and the family.  We focused on their current illness.  [name] is able to tell me that she had a heart attack.  We talk about her being treated medically.  Overall she is in no discomfort.  She tells me that she has not had an appetite, and would like soup for dinner.  The natural disease trajectory and expectations at EOL were discussed.  Advanced directives, concepts specific to code status, were not discussed that she is noted to be DNR.  Discussed the importance of continued  conversation with family and the medical providers regarding overall plan of care and treatment options, ensuring decisions are within the context of the patient's values and GOCs.  Questions and concerns were addressed.  The family was encouraged to call with questions or concerns.  PMT will continue to support holistically.  Conference with attending, bedside nursing staff, transition of care team related to patient condition, needs, goals of care, disposition.   HCPOA  NEXT OF KIN    SUMMARY OF RECOMMENDATIONS   At this point continue to treat the treatable but no CPR or intubation. Treat medically, declines invasive procedure for NSTEMI Return home/ALF where she has been living.   Code Status/Advance Care Planning: DNR  Symptom Management:  Per hospitalist, no additional needs at this time.  Palliative Prophylaxis:  Frequent Pain Assessment, Oral Care, and Turn Reposition  Additional Recommendations (Limitations, Scope, Preferences): Continue to treat, no CPR or intubation  Psycho-social/Spiritual:  Desire for further Chaplaincy support:no Additional Recommendations: Caregiving  Support/Resources  Prognosis:  Unable to determine, based on outcomes.  Discharge Planning: To Be Determined      Primary  Diagnoses: Present on Admission:  NSTEMI (non-ST elevated myocardial infarction) ([place])  Chronic diastolic CHF (congestive heart failure) ([facility])  Essential hypertension  Dementia ([facility])  PNA (pneumonia)   I have reviewed the medical record, interviewed the patient and family, and examined the patient. The following aspects are pertinent.  Past Medical History:  Diagnosis Date   Anxiety    Arthritis    Cancer ([place]) 2006   colon   Cataract    CHF (congestive heart failure) ([place]) 05/2016   preserved EF, grade 2 diastolic dysfunction   Diabetes mellitus without complication ([facility])    Diverticulitis    Erythroderma desquamativum    Hypertension    Social History    Socioeconomic History   Marital status: Widowed    Spouse name: Not on file   Number of children: Not on file   Years of education: Not on file   Highest education level: Not on file  Occupational History   Not on file  Tobacco Use   Smoking status: Never   Smokeless tobacco: Never  Substance and Sexual Activity   Alcohol use: No   Drug use: No   Sexual activity: Not on file  Other Topics Concern   Not on file  Social History Narrative   Not on file   Social Determinants of Health   Financial Resource Strain: Not on file  Food Insecurity: No Food Insecurity (09/30/2022)   Hunger Vital Sign    Worried About Running Out of Food in the Last Year: Never true    Ran Out of Food in the Last Year: Never true  Transportation Needs: No Transportation Needs (09/30/2022)   PRAPARE - Hydrologist (Medical): No    Lack of Transportation (Non-Medical): No  Physical Activity: Not on file  Stress: Not on file  Social Connections: Not on file   Family History  Problem Relation Age of Onset   Stroke Mother    Diabetes Father    Cancer Sister    Diabetes Sister    Cancer Brother    Cancer Brother    Diabetes Sister    Stroke Sister    Scheduled Meds:  allopurinol  100 mg Oral Daily   atorvastatin  40 mg Oral Daily   clopidogrel  75 mg Oral Daily   furosemide  60 mg Intravenous BID   insulin aspart  0-5 Units Subcutaneous QHS   insulin aspart  0-9 Units Subcutaneous TID WC   metoprolol succinate  25 mg Oral Daily   Continuous Infusions:  cefTRIAXone (ROCEPHIN)  IV 1 g (10/01/22 2326)   doxycycline (VIBRAMYCIN) IV 100 mg (10/02/22 1230)   heparin 1,000 Units/hr (10/02/22 0833)   PRN Meds:.acetaminophen **OR** acetaminophen, ALPRAZolam, diphenhydrAMINE, ipratropium-albuterol, nitroGLYCERIN, ondansetron (ZOFRAN) IV, polyethylene glycol Medications Prior to Admission:  Prior to Admission medications   Medication Sig Start Date End Date Taking?  Authorizing Provider  allopurinol (ZYLOPRIM) 100 MG tablet Take 100 mg by mouth daily.   Yes [provider]  amLODipine (NORVASC) 10 MG tablet Take 10 mg by mouth every morning. 07/18/19  Yes [provider]  carvedilol (COREG) 6.25 MG tablet Take 1 tablet (6.25 mg total) by mouth 2 (two) times daily with a meal. 05/08/17  Yes Tat, David, MD  docusate sodium (COLACE) 100 MG capsule Take 1 capsule (100 mg total) by mouth daily. 09/19/19  Yes Johnson, Clanford L, MD  fenofibrate (TRICOR) 48 MG tablet Take 48 mg by mouth daily.   Yes  [provider]  furosemide (LASIX) 40 MG tablet Take 1 tablet (40 mg total) by mouth every morning. Patient taking differently: Take 20-40 mg by mouth 2 (two) times daily. Take 2 tablets in the morning and 1 tablet in the evening. 05/08/17  Yes Tat, Shanon Brow, MD  HUMALOG KWIKPEN 200 UNIT/ML SOPN Inject 0-15 Units into the skin 3 (three) times daily with meals. CBG 70-120--no units; 121-150--2 units; 151-200--3 units; 201-250--5 units; 251-300--8 units; 301-350--11 units; 351-400--15 units 05/08/17  Yes Tat, David, MD  linagliptin (TRADJENTA) 5 MG TABS tablet Take 1 tablet (5 mg total) by mouth daily. 09/19/19  Yes Johnson, Clanford L, MD  metFORMIN (GLUCOPHAGE-XR) 500 MG 24 hr tablet Take 500 mg by mouth 2 (two) times daily. 07/18/19  Yes [provider]  potassium chloride SA (K-DUR,KLOR-CON) 10 MEQ tablet Take 1 tablet (10 mEq total) by mouth daily. 05/09/17  Yes Tat, Shanon Brow, MD  pravastatin (PRAVACHOL) 20 MG tablet Take 20 mg by mouth daily.   Yes [provider]  Vitamin D, Ergocalciferol, (DRISDOL) 1.25 MG (50000 UNIT) CAPS capsule Take 50,000 Units by mouth once a week. 09/23/22  Yes [provider]  acetaminophen (TYLENOL) 325 MG tablet Take 2 tablets (650 mg total) by mouth in the morning, at noon, and at bedtime. Patient not taking: Reported on 01/08/2022 09/19/19   Murlean Iba, MD  ALPRAZolam Duanne Moron) 0.5 MG tablet  Take 1 tablet (0.5 mg total) by mouth 3 (three) times daily as needed for anxiety or sleep. Patient not taking: Reported on 01/08/2022 09/19/19   Irwin Brakeman L, MD  insulin glargine (LANTUS) 100 UNIT/ML injection Inject 0.2 mLs (20 Units total) into the skin at bedtime. Patient not taking: Reported on 09/30/2022 09/19/19   Murlean Iba, MD  triamcinolone cream (KENALOG) 0.1 % APPLY TWICE DAILY Patient not taking: Reported on 01/08/2022 01/13/12   Rise Mu, PA-C   Allergies  Allergen Reactions   Ace Inhibitors Dermatitis   Aspirin    Glipizide     rash   Neosporin [Neomycin-Bacitracin Zn-Polymyx] Dermatitis   Spironolactone     rash   Diovan [Valsartan] Rash   Keflex [Cephalexin] Rash   Review of Systems  Unable to perform ROS: Age    Physical Exam Vitals and nursing note reviewed.     Vital Signs: BP (!) 154/81 (BP Location: Left Arm)   Pulse 85   Temp (!) 97.5 F (36.4 C) (Oral)   Resp 20   Ht 5\' 3"  (1.6 m)   Wt 63.4 kg   SpO2 98%   BMI 24.76 kg/m  Pain Scale: 0-10 POSS *See Group Information*: S-Acceptable,Sleep, easy to arouse Pain Score: 0-No pain   SpO2: SpO2: 98 % O2 Device:SpO2: 98 % O2 Flow Rate: .O2 Flow Rate (L/min): 7 L/min  IO: Intake/output summary:  Intake/Output Summary (Last 24 hours) at 10/02/2022 1341 Last data filed at 10/02/2022 0844 Gross per 24 hour  Intake 437.49 ml  Output 350 ml  Net 87.49 ml    LBM: Last BM Date : 10/01/22 Baseline Weight: Weight: 77.1 kg Most recent weight: Weight: 63.4 kg     Palliative Assessment/Data:     Time In: 1300 Time Out: 1355 Time Total: 55 minutes  Greater than 50%  of this time was spent counseling and coordinating care related to the above assessment and plan.  Signed by: Drue Novel, NP   Please contact Palliative Medicine Team phone at [phone] for questions and concerns.  For individual  provider: See Shea Evans

## 2022-10-02 NOTE — Progress Notes (Signed)
Patient weaned from 7 liters HFNC down to 5 liters HFNC during shift. Checked patients oxygen on 6 liters oxygen saturation 100%, lowered oxygen to 5 liters oxygen saturation 98%. Patient reported no complaints of shortness of breath. MD Manuella Ghazi made aware.

## 2022-10-02 NOTE — Progress Notes (Signed)
Patient pulled off gown and complaining of being hot, Vitals checked no temp noted, Oxygen level at 88% on 2L [name], increased to 3L called repiratory. nebulizer treatment given by RT, with little increase. RT placed Patient on 5L HFNC, with little increase. Dr. Josephine Cables notified, no new orders and informed to continue monitoring. RT increased patient to 7L HFNC. This nurse turned TV on as diversion to give patient something to look at, patient became a little more calm, Oxygen level at 96%. Continuing to monitor patient.

## 2022-10-02 NOTE — Progress Notes (Signed)
PROGRESS NOTE    [name]  [id number] DOB: June 08, 1931 DOA: 09/30/2022 PCP: Merryl Hacker, No   Brief Narrative:    [name] is a 89 y.o. female with medical history significant for diastolic CHF, diabetic foot, hypertension, psoriasis, dementia. Patient was brought to the ED from home with reports of severe chest pain that started yesterday, she reports feeling of indigestion, pain radiating down her right arm.  Patient was admitted with NSTEMI and started on heparin drip.  She is not wanting any interventional procedures and prefers medical management.  Cardiology consulted with plans to continue heparin drip for 48 hours and noted to have NSTEMI with LAD infarct noted on 2D echocardiogram 3/20.  Assessment & Plan:   Principal Problem:   NSTEMI (non-ST elevated myocardial infarction) ([place]) Active Problems:   Chronic diastolic CHF (congestive heart failure) (H[facility])   PNA (pneumonia)   Essential hypertension   DM (diabetes mellitus) ([place])   Dementia (HCC)  Assessment and Plan:    NSTEMI (non-ST elevated myocardial infarction) ([place]) Presents with chest pain, troponin elevated at 5547, with EKG with ST and T wave abnormalities in inferior and anterior leads.   -Appreciate cardiology recommendations to continue heparin for 48 hours -Continue statin, beta-blocker, and Plavix -2D echocardiogram with LVEF 35-40% with grade 2 diastolic dysfunction and LAD regional infarct noted. -Continue medical management and diuresis per cardiology   PNA (pneumonia) with acute hypoxemic respiratory failure Re-evaluated later due to increasing dyspnea, now with diffuse wheeze and increased work of breathing. Chest xray - atelectasis or PNA. Not hypoxic. Never smoked cigarettes. Rules out for sepsis. -IV Rocephin and doxycycline -Continue to wean oxygen, currently at 7 L nasal cannula   HFrEF -New LVEF 35-40% as noted above - 1L bolus given in ED -Continue daily Lasix per nephrology and check Reds  vest   Dementia ([place]) Per family she is awake alert oriented x4, for the most part, but has frequent episodes of sundowning.   DM (diabetes mellitus) ([place]) - HgbA1c - SSI- S -Hold home metformin, linagliptin for now   Essential hypertension Stable -Soft blood pressure readings at this time with metoprolol and Norvasc held for this morning    DVT prophylaxis:Heparin drip Code Status: DNR Family Communication: None at bedside Disposition Plan:  Status is: Inpatient Remains inpatient appropriate because: Need for continued IV medications.    Consultants:  Cardiology  Procedures:  None  Antimicrobials:  Anti-infectives (From admission, onward)    Start     Dose/Rate Route Frequency Ordered Stop   10/02/22 0000  cefTRIAXone (ROCEPHIN) 1 g in sodium chloride 0.9 % 100 mL IVPB        1 g 200 mL/hr over 30 Minutes Intravenous Every 24 hours 10/01/22 0015     10/01/22 0115  cefTRIAXone (ROCEPHIN) 1 g in sodium chloride 0.9 % 100 mL IVPB        1 g 200 mL/hr over 30 Minutes Intravenous  Once 10/01/22 0015 10/01/22 0138   10/01/22 0115  doxycycline (VIBRAMYCIN) 100 mg in sodium chloride 0.9 % 250 mL IVPB        100 mg 125 mL/hr over 120 Minutes Intravenous Every 12 hours 10/01/22 0015         Subjective: Patient seen and evaluated today with no new acute complaints or concerns. No acute concerns or events noted overnight.  She denies any current chest pain or shortness of breath.  Objective: Vitals:   10/02/22 0442 10/02/22 0742 10/02/22 0922 10/02/22 1030  BP:  (!) 148/73 91/68   Pulse:  89 81   Resp:  (!) 22 20   Temp:      TempSrc:  Oral    SpO2: (!) 86% 94%    Weight:    63.4 kg  Height:        Intake/Output Summary (Last 24 hours) at 10/02/2022 1148 Last data filed at 10/02/2022 0844 Gross per 24 hour  Intake 677.49 ml  Output 350 ml  Net 327.49 ml   Filed Weights   09/30/22 1807 10/01/22 0500 10/02/22 1030  Weight: 63.5 kg 63.7 kg 63.4 kg     Examination:  General exam: Appears calm and comfortable, elderly, hard of hearing Respiratory system: Clear to auscultation. Respiratory effort normal.  7 L nasal cannula Cardiovascular system: S1 & S2 heard, RRR.  Gastrointestinal system: Abdomen is soft Central nervous system: Alert and awake Extremities: No edema Skin: No significant lesions noted Psychiatry: Flat affect.    Data Reviewed: I have personally reviewed following labs and imaging studies  CBC: Recent Labs  Lab 09/30/22 1336 10/01/22 0434 10/02/22 0411  WBC 8.3 10.7* 12.3*  NEUTROABS 6.5  --   --   HGB 10.4* 10.8* 10.9*  HCT 31.7* 33.5* 34.7*  MCV 93.0 93.1 94.8  PLT 316 359 [id number]*   Basic Metabolic Panel: Recent Labs  Lab 09/30/22 1336 10/01/22 0434 10/02/22 0411  NA 136 137 137  K 3.7 4.2 3.5  CL 104 104 104  CO2 21* 22 20*  GLUCOSE 104* 114* 241*  BUN 43* 42* 43*  CREATININE 1.66* 1.66* 1.75*  CALCIUM 7.9* 8.0* 8.2*  MG  --   --  2.2   GFR: Estimated Creatinine Clearance: 19.5 mL/min (A) (by C-G formula based on SCr of 1.75 mg/dL (H)). Liver Function Tests: Recent Labs  Lab 09/30/22 1336  AST 23  ALT 14  ALKPHOS 55  BILITOT 0.5  PROT 6.6  ALBUMIN 2.9*   No results for input(s): "LIPASE", "AMYLASE" in the last 168 hours. No results for input(s): "AMMONIA" in the last 168 hours. Coagulation Profile: Recent Labs  Lab 09/30/22 1336  INR 1.1   Cardiac Enzymes: No results for input(s): "CKTOTAL", "CKMB", "CKMBINDEX", "TROPONINI" in the last 168 hours. BNP (last 3 results) No results for input(s): "PROBNP" in the last 8760 hours. HbA1C: Recent Labs    09/30/22 1336  HGBA1C 6.2*   CBG: Recent Labs  Lab 10/01/22 1113 10/01/22 1608 10/01/22 2046 10/02/22 0722 10/02/22 1100  GLUCAP 123* 163* 147* 217* 115*   Lipid Profile: No results for input(s): "CHOL", "HDL", "LDLCALC", "TRIG", "CHOLHDL", "LDLDIRECT" in the last 72 hours. Thyroid Function Tests: No results for  input(s): "TSH", "T4TOTAL", "FREET4", "T3FREE", "THYROIDAB" in the last 72 hours. Anemia Panel: No results for input(s): "VITAMINB12", "FOLATE", "FERRITIN", "TIBC", "IRON", "RETICCTPCT" in the last 72 hours. Sepsis Labs: No results for input(s): "PROCALCITON", "LATICACIDVEN" in the last 168 hours.  Recent Results (from the past 240 hour(s))  MRSA Next Gen by PCR, Nasal     Status: None   Collection Time: 09/30/22  6:17 PM   Specimen: Nasal Mucosa; Nasal Swab  Result Value Ref Range Status   MRSA by PCR Next Gen NOT DETECTED NOT DETECTED Final    Comment: (NOTE) The GeneXpert MRSA Assay (FDA approved for NASAL specimens only), is one component of a comprehensive MRSA colonization surveillance program. It is not intended to diagnose MRSA infection nor to guide or monitor treatment for MRSA infections. Test performance is not  FDA approved in patients less than 27 years old. Performed at [facility], [address] [place], [place] [address]          Radiology Studies: ECHOCARDIOGRAM COMPLETE  Result Date: 10/01/2022    ECHOCARDIOGRAM REPORT   Patient Name:   [name] Date of Exam: 10/01/2022 Medical Rec #:  [id number]     Height:       63.0 in Accession #:    [id number]    Weight:       140.4 lb Date of Birth:  Jun 19, 1931     BSA:          1.664 m Patient Age:    48 years      BP:           142/62 mmHg Patient Gender: F             HR:           69 bpm. Exam Location:  [facility] Procedure: 2D Echo, 3D Echo, Cardiac Doppler, Intracardiac Opacification Agent            and Strain Analysis Indications:    NSTEMI I21.4  History:        Patient has no prior history of Echocardiogram examinations.                 CHF, NSTEMI, Signs/Symptoms:Altered Mental Status; Risk                 Factors:Hypertension, Diabetes and Non-Smoker.  Sonographer:    Greer Pickerel Referring Phys: [id number] [facility] A Gasper Sells  Sonographer Comments: Image acquisition challenging due to patient body habitus and  Image acquisition challenging due to respiratory motion. Global longitudinal strain was attempted. IMPRESSIONS  1. Left ventricular ejection fraction, by estimation, is 35 to 40%. The left ventricle has moderately decreased function. The left ventricle demonstrates regional wall motion abnormalities (study consistent with LAD infarct.). There is mild concentric left ventricular hypertrophy. Left ventricular diastolic parameters are consistent with Grade II diastolic dysfunction (pseudonormalization). There is the interventricular septum is flattened in systole and diastole, consistent with right ventricular pressure and volume overload.  2. Right ventricular systolic function is normal. The right ventricular size is normal. There is mildly elevated pulmonary artery systolic pressure. The estimated right ventricular systolic pressure is [id number] mmHg.  3. Left atrial size was mildly dilated.  4. Moderate pericardial effusion.  5. The mitral valve is grossly normal. Mild to moderate mitral valve regurgitation.  6. Tricuspid valve regurgitation is moderate.  7. The aortic valve is tricuspid. Aortic valve regurgitation is trivial. Aortic valve sclerosis is present, with no evidence of aortic valve stenosis.  8. The inferior vena cava is normal in size with <50% respiratory variability, suggesting right atrial pressure of 8 mmHg. Comparison(s): No prior Echocardiogram. FINDINGS  Left Ventricle: Left ventricular ejection fraction, by estimation, is 35 to 40%. The left ventricle has moderately decreased function. The left ventricle demonstrates regional wall motion abnormalities. Definity contrast agent was given IV to delineate the left ventricular endocardial borders. The average left ventricular global longitudinal strain is -10.0 %. The global longitudinal strain is abnormal. 3D ejection fraction reviewed and evaluated as part of the interpretation. Alternate measurement of EF is felt to be most reflective of LV function.  The left ventricular internal cavity size was normal in size. There is mild concentric left ventricular hypertrophy. The interventricular septum is flattened in systole and diastole, consistent with right ventricular pressure and volume overload. Left  ventricular diastolic parameters are consistent with Grade II diastolic dysfunction (pseudonormalization).  LV Wall Scoring: The mid and distal anterior septum, inferior septum, apical lateral segment, apical anterior segment, and apical inferior segment are akinetic. The inferior wall, basal anteroseptal segment, and mid anterior segment are hypokinetic. Right Ventricle: The right ventricular size is normal. No increase in right ventricular wall thickness. Right ventricular systolic function is normal. There is mildly elevated pulmonary artery systolic pressure. The tricuspid regurgitant velocity is 2.82  m/s, and with an assumed right atrial pressure of 8 mmHg, the estimated right ventricular systolic pressure is [id number] mmHg. Left Atrium: Left atrial size was mildly dilated. Right Atrium: Right atrial size was normal in size. Pericardium: A moderately sized pericardial effusion is present. Mitral Valve: The mitral valve is grossly normal. Mild to moderate mitral valve regurgitation. Tricuspid Valve: The tricuspid valve is normal in structure. Tricuspid valve regurgitation is moderate. Aortic Valve: The aortic valve is tricuspid. Aortic valve regurgitation is trivial. Aortic valve sclerosis is present, with no evidence of aortic valve stenosis. Pulmonic Valve: The pulmonic valve was not well visualized. Pulmonic valve regurgitation is not visualized. Aorta: The aortic root and ascending aorta are structurally normal, with no evidence of dilitation. Venous: The inferior vena cava is normal in size with less than 50% respiratory variability, suggesting right atrial pressure of 8 mmHg. IAS/Shunts: No atrial level shunt detected by color flow Doppler.  LEFT VENTRICLE PLAX  2D LVIDd:         4.60 cm   Diastology LVIDs:         3.40 cm   LV e' medial:    4.91 cm/s LV PW:         1.10 cm   LV E/e' medial:  22.8 LV IVS:        1.10 cm   LV e' lateral:   6.13 cm/s LVOT diam:     1.80 cm   LV E/e' lateral: 18.3 LV SV:         55 LV SV Index:   33        2D Longitudinal Strain LVOT Area:     2.54 cm  2D Strain GLS Avg:     -10.0 %                           3D Volume EF:                          3D EF:        46 %                          LV EDV:       143 ml                          LV ESV:       77 ml                          LV SV:        66 ml RIGHT VENTRICLE RV S prime:     9.67 cm/s TAPSE (M-mode): 1.9 cm LEFT ATRIUM             Index        RIGHT ATRIUM  Index LA diam:        3.90 cm 2.34 cm/m   RA Area:     14.20 cm LA Vol (A2C):   67.2 ml 40.39 ml/m  RA Volume:   36.40 ml  21.88 ml/m LA Vol (A4C):   58.8 ml 35.34 ml/m LA Biplane Vol: 64.8 ml 38.95 ml/m  AORTIC VALVE LVOT Vmax:   94.80 cm/s LVOT Vmean:  64.800 cm/s LVOT VTI:    0.218 m  AORTA Ao Root diam: 3.40 cm Ao Asc diam:  3.40 cm MITRAL VALVE                TRICUSPID VALVE MV Area (PHT): 4.60 cm     TR Peak grad:   31.8 mmHg MV Decel Time: 165 msec     TR Vmax:        282.00 cm/s MR Peak grad: 58.7 mmHg MR Vmax:      383.00 cm/s   SHUNTS MV E velocity: 112.00 cm/s  Systemic VTI:  0.22 m MV A velocity: 116.00 cm/s  Systemic Diam: 1.80 cm MV E/A ratio:  0.97 Rudean Haskell MD Electronically signed by Rudean Haskell MD Signature Date/Time: 10/01/2022/1:41:38 PM    Final    DG CHEST PORT 1 VIEW  Result Date: 09/30/2022 CLINICAL DATA:  Dyspnea. EXAM: PORTABLE CHEST 1 VIEW COMPARISON:  Earlier today FINDINGS: Stable cardiomegaly. Unchanged mediastinal contours. Development of pulmonary edema from earlier today. Increasing retrocardiac opacity and blunting of the costophrenic angle, likely combination of pleural effusion and atelectasis/airspace disease. No pneumothorax. Thoracic spondylosis and  bilateral glenohumeral arthropathy. IMPRESSION: 1. Development of pulmonary edema from earlier today. 2. Increasing retrocardiac opacity and blunting of the costophrenic angle, likely combination of pleural effusion and atelectasis/airspace disease. Electronically Signed   By: Keith Rake M.D.   On: 09/30/2022 23:09   DG Chest Port 1 View  Result Date: 09/30/2022 CLINICAL DATA:  Indigestion. Chest discomfort radiating down the right arm. EXAM: PORTABLE CHEST 1 VIEW COMPARISON:  09/14/2019 FINDINGS: Apical lordotic projection. Mild enlargement of the cardiopericardial silhouette. Indistinct obscuration left hemidiaphragm in the retrocardiac region, favored to represent atelectasis or pneumonia although a component of this appearance may be related to the apical lordotic projection obscuring the diaphragm. Substantial degenerative glenohumeral arthropathy bilaterally. Thoracic spondylosis. IMPRESSION: 1. Mild enlargement of the cardiopericardial silhouette, without edema. 2. Indistinct left hemidiaphragm in the retrocardiac region, favored to represent atelectasis or pneumonia. 3. Thoracic spondylosis. 4. Substantial degenerative glenohumeral arthropathy bilaterally. Electronically Signed   By: Van Clines M.D.   On: 09/30/2022 16:25        Scheduled Meds:  allopurinol  100 mg Oral Daily   atorvastatin  40 mg Oral Daily   clopidogrel  75 mg Oral Daily   furosemide  20 mg Intravenous Once   furosemide  60 mg Intravenous BID   insulin aspart  0-5 Units Subcutaneous QHS   insulin aspart  0-9 Units Subcutaneous TID WC   metoprolol succinate  25 mg Oral Daily   Continuous Infusions:  cefTRIAXone (ROCEPHIN)  IV 1 g (10/01/22 2326)   doxycycline (VIBRAMYCIN) IV 100 mg (10/01/22 2057)   heparin 1,000 Units/hr (10/02/22 0833)     LOS: 1 day    Time spent: 35 minutes    Kyandre Okray D Manuella Ghazi, DO [facility]  If 7PM-7AM, please contact night-coverage [email or web address] 10/02/2022, 11:48  AM

## 2022-10-02 NOTE — Progress Notes (Signed)
   10/02/22 1048  ReDS Vest / Clip  Station Marker A  Ruler Value 32  ReDS Value Range < 36  ReDS Actual Value 27

## 2022-10-03 ENCOUNTER — Inpatient Hospital Stay (HOSPITAL_COMMUNITY): Payer: Medicare Other

## 2022-10-03 DIAGNOSIS — I214 Non-ST elevation (NSTEMI) myocardial infarction: Secondary | ICD-10-CM | POA: Diagnosis not present

## 2022-10-03 LAB — MAGNESIUM: Magnesium: 1.9 mg/dL (ref 1.7–2.4)

## 2022-10-03 LAB — GLUCOSE, CAPILLARY
Glucose-Capillary: 118 mg/dL — ABNORMAL HIGH (ref 70–99)
Glucose-Capillary: 155 mg/dL — ABNORMAL HIGH (ref 70–99)
Glucose-Capillary: 166 mg/dL — ABNORMAL HIGH (ref 70–99)
Glucose-Capillary: 168 mg/dL — ABNORMAL HIGH (ref 70–99)

## 2022-10-03 LAB — BASIC METABOLIC PANEL
Anion gap: 12 (ref 5–15)
BUN: 37 mg/dL — ABNORMAL HIGH (ref 8–23)
CO2: 22 mmol/L (ref 22–32)
Calcium: 8.1 mg/dL — ABNORMAL LOW (ref 8.9–10.3)
Chloride: 100 mmol/L (ref 98–111)
Creatinine, Ser: 1.44 mg/dL — ABNORMAL HIGH (ref 0.44–1.00)
GFR, Estimated: 35 mL/min — ABNORMAL LOW (ref >60)
Glucose, Bld: 205 mg/dL — ABNORMAL HIGH (ref 70–99)
Potassium: 3.4 mmol/L — ABNORMAL LOW (ref 3.5–5.1)
Sodium: 134 mmol/L — ABNORMAL LOW (ref 135–145)

## 2022-10-03 LAB — BRAIN NATRIURETIC PEPTIDE: B Natriuretic Peptide: 2783 pg/mL — ABNORMAL HIGH (ref 0.0–100.0)

## 2022-10-03 MED ORDER — HEPARIN SODIUM (PORCINE) 5000 UNIT/ML IJ SOLN
5000.0000 [IU] | Freq: Three times a day (TID) | INTRAMUSCULAR | Status: DC
  Administered 2022-10-03 – 2022-10-04 (×4): 5000 [IU] via SUBCUTANEOUS
  Filled 2022-10-03 (×4): qty 1

## 2022-10-03 MED ORDER — POTASSIUM CHLORIDE CRYS ER 20 MEQ PO TBCR
40.0000 meq | EXTENDED_RELEASE_TABLET | Freq: Once | ORAL | Status: AC
  Administered 2022-10-03: 40 meq via ORAL
  Filled 2022-10-03: qty 2

## 2022-10-03 NOTE — Progress Notes (Signed)
PROGRESS NOTE    [name]  [id number] DOB: 16-Aug-1930 DOA: 09/30/2022 PCP: Merryl Hacker, No   Brief Narrative:    [name] is a 89 y.o. female with medical history significant for diastolic CHF, diabetic foot, hypertension, psoriasis, dementia. Patient was brought to the ED from home with reports of severe chest pain that started yesterday, she reports feeling of indigestion, pain radiating down her right arm.  Patient was admitted with NSTEMI and started on heparin drip.  She is not wanting any interventional procedures and prefers medical management.  Cardiology consulted with plans to continue heparin drip for 48 hours and noted to have NSTEMI with LAD infarct noted on 2D echocardiogram 3/20.  Assessment & Plan:   Principal Problem:   NSTEMI (non-ST elevated myocardial infarction) ([place]) Active Problems:   Chronic diastolic CHF (congestive heart failure) (H[facility])   PNA (pneumonia)   Essential hypertension   DM (diabetes mellitus) ([place])   Dementia (HCC)  Assessment and Plan:    NSTEMI (non-ST elevated myocardial infarction) ([place]) Presents with chest pain, troponin elevated at 5547, with EKG with ST and T wave abnormalities in inferior and anterior leads.   -Appreciate cardiology recommendations to continue heparin for 48 hours -Continue statin, beta-blocker, and Plavix -2D echocardiogram with LVEF 35-40% with grade 2 diastolic dysfunction and LAD regional infarct noted. -Continue medical management and diuresis per cardiology   PNA (pneumonia) with acute hypoxemic respiratory failure Re-evaluated later due to increasing dyspnea, now with diffuse wheeze and increased work of breathing. Chest xray - atelectasis or PNA. Not hypoxic. Never smoked cigarettes. Rules out for sepsis. -IV Rocephin and doxycycline -Continue to wean oxygen to off   HFrEF -New LVEF 35-40% as noted above - 1L bolus given in ED -Continue daily Lasix per nephrology for now with elevated BNP and pleural  effusion on chest x-ray -Plan to discharge in a.m. on Lasix 40 mg twice daily   Dementia ([place]) Per family she is awake alert oriented x4, for the most part, but has frequent episodes of sundowning.   DM (diabetes mellitus) ([place]) - HgbA1c 6.2% - SSI- S -Hold home metformin, linagliptin for now   Essential hypertension Stable -Soft blood pressure readings at this time with metoprolol and Norvasc held for this morning    DVT prophylaxis: Heparin Code Status: DNR Family Communication: None at bedside Disposition Plan:  Status is: Inpatient Remains inpatient appropriate because: Need for continued IV medications.    Consultants:  Cardiology  Procedures:  None  Antimicrobials:  Anti-infectives (From admission, onward)    Start     Dose/Rate Route Frequency Ordered Stop   10/02/22 0000  cefTRIAXone (ROCEPHIN) 1 g in sodium chloride 0.9 % 100 mL IVPB        1 g 200 mL/hr over 30 Minutes Intravenous Every 24 hours 10/01/22 0015     10/01/22 0115  cefTRIAXone (ROCEPHIN) 1 g in sodium chloride 0.9 % 100 mL IVPB        1 g 200 mL/hr over 30 Minutes Intravenous  Once 10/01/22 0015 10/01/22 0138   10/01/22 0115  doxycycline (VIBRAMYCIN) 100 mg in sodium chloride 0.9 % 250 mL IVPB        100 mg 125 mL/hr over 120 Minutes Intravenous Every 12 hours 10/01/22 0015         Subjective: Patient seen and evaluated today with no new acute complaints or concerns. No acute concerns or events noted overnight.  She denies any current chest pain or shortness of  breath.  Objective: Vitals:   10/03/22 0723 10/03/22 0837 10/03/22 1305 10/03/22 1310  BP: (!) 156/82 131/70 129/76 108/62  Pulse: 84 84 (!) 126 82  Resp: 19 18 (!) 26 (!) 23  Temp: (!) 97.5 F (36.4 C)  98.4 F (36.9 C) 98.4 F (36.9 C)  TempSrc: Oral  Oral Oral  SpO2: 100% 100%  95%  Weight:      Height:        Intake/Output Summary (Last 24 hours) at 10/03/2022 1320 Last data filed at 10/03/2022 0500 Gross per 24 hour   Intake 1110 ml  Output 800 ml  Net 310 ml   Filed Weights   09/30/22 1807 10/01/22 0500 10/02/22 1030  Weight: 63.5 kg 63.7 kg 63.4 kg    Examination:  General exam: Appears calm and comfortable, elderly, hard of hearing Respiratory system: Clear to auscultation. Respiratory effort normal.  5 L nasal cannula Cardiovascular system: S1 & S2 heard, RRR.  Gastrointestinal system: Abdomen is soft Central nervous system: Alert and awake Extremities: No edema Skin: No significant lesions noted Psychiatry: Flat affect.    Data Reviewed: I have personally reviewed following labs and imaging studies  CBC: Recent Labs  Lab 09/30/22 1336 10/01/22 0434 10/02/22 0411  WBC 8.3 10.7* 12.3*  NEUTROABS 6.5  --   --   HGB 10.4* 10.8* 10.9*  HCT 31.7* 33.5* 34.7*  MCV 93.0 93.1 94.8  PLT 316 359 [id number]*   Basic Metabolic Panel: Recent Labs  Lab 09/30/22 1336 10/01/22 0434 10/02/22 0411 10/03/22 0913  NA 136 137 137 134*  K 3.7 4.2 3.5 3.4*  CL 104 104 104 100  CO2 21* 22 20* 22  GLUCOSE 104* 114* 241* 205*  BUN 43* 42* 43* 37*  CREATININE 1.66* 1.66* 1.75* 1.44*  CALCIUM 7.9* 8.0* 8.2* 8.1*  MG  --   --  2.2 1.9   GFR: Estimated Creatinine Clearance: 23.7 mL/min (A) (by C-G formula based on SCr of 1.44 mg/dL (H)). Liver Function Tests: Recent Labs  Lab 09/30/22 1336  AST 23  ALT 14  ALKPHOS 55  BILITOT 0.5  PROT 6.6  ALBUMIN 2.9*   No results for input(s): "LIPASE", "AMYLASE" in the last 168 hours. No results for input(s): "AMMONIA" in the last 168 hours. Coagulation Profile: Recent Labs  Lab 09/30/22 1336  INR 1.1   Cardiac Enzymes: No results for input(s): "CKTOTAL", "CKMB", "CKMBINDEX", "TROPONINI" in the last 168 hours. BNP (last 3 results) No results for input(s): "PROBNP" in the last 8760 hours. HbA1C: Recent Labs    09/30/22 1336  HGBA1C 6.2*   CBG: Recent Labs  Lab 10/02/22 1100 10/02/22 1619 10/02/22 2233 10/03/22 0707 10/03/22 1112   GLUCAP 115* 99 129* 118* 155*   Lipid Profile: No results for input(s): "CHOL", "HDL", "LDLCALC", "TRIG", "CHOLHDL", "LDLDIRECT" in the last 72 hours. Thyroid Function Tests: No results for input(s): "TSH", "T4TOTAL", "FREET4", "T3FREE", "THYROIDAB" in the last 72 hours. Anemia Panel: No results for input(s): "VITAMINB12", "FOLATE", "FERRITIN", "TIBC", "IRON", "RETICCTPCT" in the last 72 hours. Sepsis Labs: No results for input(s): "PROCALCITON", "LATICACIDVEN" in the last 168 hours.  Recent Results (from the past 240 hour(s))  MRSA Next Gen by PCR, Nasal     Status: None   Collection Time: 09/30/22  6:17 PM   Specimen: Nasal Mucosa; Nasal Swab  Result Value Ref Range Status   MRSA by PCR Next Gen NOT DETECTED NOT DETECTED Final    Comment: (NOTE) The GeneXpert MRSA Assay (FDA  approved for NASAL specimens only), is one component of a comprehensive MRSA colonization surveillance program. It is not intended to diagnose MRSA infection nor to guide or monitor treatment for MRSA infections. Test performance is not FDA approved in patients less than 68 years old. Performed at [facility], [address] [place], [place] [address]          Radiology Studies: DG CHEST PORT 1 VIEW  Result Date: 10/03/2022 CLINICAL DATA:  Dyspnea. EXAM: PORTABLE CHEST 1 VIEW COMPARISON:  September 30, 2022. FINDINGS: Stable cardiomediastinal silhouette. Left pleural effusion is noted with associated left basilar atelectasis or infiltrate. Minimal right basilar subsegmental atelectasis is noted. Degenerative changes are seen involving both shoulders. IMPRESSION: Left pleural effusion with associated left basilar atelectasis or infiltrate. Minimal right basilar subsegmental atelectasis. Electronically Signed   By: Marijo Conception M.D.   On: 10/03/2022 12:11   ECHOCARDIOGRAM COMPLETE  Result Date: 10/01/2022    ECHOCARDIOGRAM REPORT   Patient Name:   [name] Date of Exam: 10/01/2022 Medical Rec #:   [id number]     Height:       63.0 in Accession #:    [id number]    Weight:       140.4 lb Date of Birth:  September 14, 1930     BSA:          1.664 m Patient Age:    43 years      BP:           142/62 mmHg Patient Gender: F             HR:           69 bpm. Exam Location:  [facility] Procedure: 2D Echo, 3D Echo, Cardiac Doppler, Intracardiac Opacification Agent            and Strain Analysis Indications:    NSTEMI I21.4  History:        Patient has no prior history of Echocardiogram examinations.                 CHF, NSTEMI, Signs/Symptoms:Altered Mental Status; Risk                 Factors:Hypertension, Diabetes and Non-Smoker.  Sonographer:    Greer Pickerel Referring Phys: [id number] [facility]  Sonographer Comments: Image acquisition challenging due to patient body habitus and Image acquisition challenging due to respiratory motion. Global longitudinal strain was attempted. IMPRESSIONS  1. Left ventricular ejection fraction, by estimation, is 35 to 40%. The left ventricle has moderately decreased function. The left ventricle demonstrates regional wall motion abnormalities (study consistent with LAD infarct.). There is mild concentric left ventricular hypertrophy. Left ventricular diastolic parameters are consistent with Grade II diastolic dysfunction (pseudonormalization). There is the interventricular septum is flattened in systole and diastole, consistent with right ventricular pressure and volume overload.  2. Right ventricular systolic function is normal. The right ventricular size is normal. There is mildly elevated pulmonary artery systolic pressure. The estimated right ventricular systolic pressure is [id number] mmHg.  3. Left atrial size was mildly dilated.  4. Moderate pericardial effusion.  5. The mitral valve is grossly normal. Mild to moderate mitral valve regurgitation.  6. Tricuspid valve regurgitation is moderate.  7. The aortic valve is tricuspid. Aortic valve regurgitation is trivial. Aortic valve  sclerosis is present, with no evidence of aortic valve stenosis.  8. The inferior vena cava is normal in size with <50% respiratory variability, suggesting right atrial pressure of 8  mmHg. Comparison(s): No prior Echocardiogram. FINDINGS  Left Ventricle: Left ventricular ejection fraction, by estimation, is 35 to 40%. The left ventricle has moderately decreased function. The left ventricle demonstrates regional wall motion abnormalities. Definity contrast agent was given IV to delineate the left ventricular endocardial borders. The average left ventricular global longitudinal strain is -10.0 %. The global longitudinal strain is abnormal. 3D ejection fraction reviewed and evaluated as part of the interpretation. Alternate measurement of EF is felt to be most reflective of LV function. The left ventricular internal cavity size was normal in size. There is mild concentric left ventricular hypertrophy. The interventricular septum is flattened in systole and diastole, consistent with right ventricular pressure and volume overload. Left ventricular diastolic parameters are consistent with Grade II diastolic dysfunction (pseudonormalization).  LV Wall Scoring: The mid and distal anterior septum, inferior septum, apical lateral segment, apical anterior segment, and apical inferior segment are akinetic. The inferior wall, basal anteroseptal segment, and mid anterior segment are hypokinetic. Right Ventricle: The right ventricular size is normal. No increase in right ventricular wall thickness. Right ventricular systolic function is normal. There is mildly elevated pulmonary artery systolic pressure. The tricuspid regurgitant velocity is 2.82  m/s, and with an assumed right atrial pressure of 8 mmHg, the estimated right ventricular systolic pressure is [id number] mmHg. Left Atrium: Left atrial size was mildly dilated. Right Atrium: Right atrial size was normal in size. Pericardium: A moderately sized pericardial effusion is present.  Mitral Valve: The mitral valve is grossly normal. Mild to moderate mitral valve regurgitation. Tricuspid Valve: The tricuspid valve is normal in structure. Tricuspid valve regurgitation is moderate. Aortic Valve: The aortic valve is tricuspid. Aortic valve regurgitation is trivial. Aortic valve sclerosis is present, with no evidence of aortic valve stenosis. Pulmonic Valve: The pulmonic valve was not well visualized. Pulmonic valve regurgitation is not visualized. Aorta: The aortic root and ascending aorta are structurally normal, with no evidence of dilitation. Venous: The inferior vena cava is normal in size with less than 50% respiratory variability, suggesting right atrial pressure of 8 mmHg. IAS/Shunts: No atrial level shunt detected by color flow Doppler.  LEFT VENTRICLE PLAX 2D LVIDd:         4.60 cm   Diastology LVIDs:         3.40 cm   LV e' medial:    4.91 cm/s LV PW:         1.10 cm   LV E/e' medial:  22.8 LV IVS:        1.10 cm   LV e' lateral:   6.13 cm/s LVOT diam:     1.80 cm   LV E/e' lateral: 18.3 LV SV:         55 LV SV Index:   33        2D Longitudinal Strain LVOT Area:     2.54 cm  2D Strain GLS Avg:     -10.0 %                           3D Volume EF:                          3D EF:        46 %                          LV EDV:  143 ml                          LV ESV:       77 ml                          LV SV:        66 ml RIGHT VENTRICLE RV S prime:     9.67 cm/s TAPSE (M-mode): 1.9 cm LEFT ATRIUM             Index        RIGHT ATRIUM           Index LA diam:        3.90 cm 2.34 cm/m   RA Area:     14.20 cm LA Vol (A2C):   67.2 ml 40.39 ml/m  RA Volume:   36.40 ml  21.88 ml/m LA Vol (A4C):   58.8 ml 35.34 ml/m LA Biplane Vol: 64.8 ml 38.95 ml/m  AORTIC VALVE LVOT Vmax:   94.80 cm/s LVOT Vmean:  64.800 cm/s LVOT VTI:    0.218 m  AORTA Ao Root diam: 3.40 cm Ao Asc diam:  3.40 cm MITRAL VALVE                TRICUSPID VALVE MV Area (PHT): 4.60 cm     TR Peak grad:   31.8 mmHg MV Decel  Time: 165 msec     TR Vmax:        282.00 cm/s MR Peak grad: 58.7 mmHg MR Vmax:      383.00 cm/s   SHUNTS MV E velocity: 112.00 cm/s  Systemic VTI:  0.22 m MV A velocity: 116.00 cm/s  Systemic Diam: 1.80 cm MV E/A ratio:  0.97 Rudean Haskell MD Electronically signed by Rudean Haskell MD Signature Date/Time: 10/01/2022/1:41:38 PM    Final         Scheduled Meds:  allopurinol  100 mg Oral Daily   atorvastatin  40 mg Oral Daily   clopidogrel  75 mg Oral Daily   furosemide  60 mg Intravenous BID   heparin  5,000 Units Subcutaneous Q8H   insulin aspart  0-5 Units Subcutaneous QHS   insulin aspart  0-9 Units Subcutaneous TID WC   metoprolol succinate  25 mg Oral Daily   Continuous Infusions:  cefTRIAXone (ROCEPHIN)  IV 1 g (10/03/22 0202)   doxycycline (VIBRAMYCIN) IV 100 mg (10/03/22 1055)     LOS: 2 days    Time spent: 35 minutes    Lukka Black Darleen Crocker, DO [facility]  If 7PM-7AM, please contact night-coverage [email or web address] 10/03/2022, 1:20 PM

## 2022-10-03 NOTE — Progress Notes (Signed)
Rounding Note    Patient Name: [name] Date of Encounter: 10/03/2022  [facility] Cardiologist: New  Subjective   No complaints  Inpatient Medications    Scheduled Meds:  allopurinol  100 mg Oral Daily   atorvastatin  40 mg Oral Daily   clopidogrel  75 mg Oral Daily   furosemide  60 mg Intravenous BID   insulin aspart  0-5 Units Subcutaneous QHS   insulin aspart  0-9 Units Subcutaneous TID WC   metoprolol succinate  25 mg Oral Daily   Continuous Infusions:  cefTRIAXone (ROCEPHIN)  IV 1 g (10/03/22 0202)   doxycycline (VIBRAMYCIN) IV 100 mg (10/02/22 2232)   PRN Meds: acetaminophen **OR** acetaminophen, ALPRAZolam, diphenhydrAMINE, ipratropium-albuterol, nitroGLYCERIN, ondansetron (ZOFRAN) IV, polyethylene glycol   Vital Signs    Vitals:   10/02/22 1751 10/02/22 2222 10/03/22 0540 10/03/22 0723  BP:  (!) 146/63 116/62 (!) 156/82  Pulse:  84 74 84  Resp:  (!) 22 18 19   Temp:  98 F (36.7 C) (!) 97.5 F (36.4 C) (!) 97.5 F (36.4 C)  TempSrc:  Oral Oral Oral  SpO2: 98% 100% 100%   Weight:      Height:        Intake/Output Summary (Last 24 hours) at 10/03/2022 0838 Last data filed at 10/03/2022 0500 Gross per 24 hour  Intake 1350 ml  Output 800 ml  Net 550 ml      10/02/2022   10:30 AM 10/01/2022    5:00 AM 09/30/2022    6:07 PM  Last 3 Weights  Weight (lbs) 139 lb 12.4 oz 140 lb 6.9 oz 139 lb 15.9 oz  Weight (kg) 63.4 kg 63.7 kg 63.5 kg      Telemetry    NSR - Personally Reviewed  ECG    N/a - Personally Reviewed  Physical Exam   GEN: No acute distress.   Neck: No JVD Cardiac: RRR, no murmurs, rubs, or gallops.  Respiratory: decreased breath sounds bilaterally, bilateral crackles GI: Soft, nontender, non-distended  MS: No edema; No deformity. Neuro:  Nonfocal  Psych: Normal affect   Labs    High Sensitivity Troponin:   Recent Labs  Lab 09/30/22 1336 09/30/22 1534 09/30/22 2056  TROPONINIHS 5,547* 5,816* 5,775*      Chemistry Recent Labs  Lab 09/30/22 1336 10/01/22 0434 10/02/22 0411  NA 136 137 137  K 3.7 4.2 3.5  CL 104 104 104  CO2 21* 22 20*  GLUCOSE 104* 114* 241*  BUN 43* 42* 43*  CREATININE 1.66* 1.66* 1.75*  CALCIUM 7.9* 8.0* 8.2*  MG  --   --  2.2  PROT 6.6  --   --   ALBUMIN 2.9*  --   --   AST 23  --   --   ALT 14  --   --   ALKPHOS 55  --   --   BILITOT 0.5  --   --   GFRNONAA 29* 29* 28*  ANIONGAP 11 11 13     Lipids No results for input(s): "CHOL", "TRIG", "HDL", "LABVLDL", "LDLCALC", "CHOLHDL" in the last 168 hours.  Hematology Recent Labs  Lab 09/30/22 1336 10/01/22 0434 10/02/22 0411  WBC 8.3 10.7* 12.3*  RBC 3.41* 3.60* 3.66*  HGB 10.4* 10.8* 10.9*  HCT 31.7* 33.5* 34.7*  MCV 93.0 93.1 94.8  MCH 30.5 30.0 29.8  MCHC 32.8 32.2 31.4  RDW 15.1 15.0 15.2  PLT 316 359 426*   Thyroid No results for  input(s): "TSH", "FREET4" in the last 168 hours.  BNP Recent Labs  Lab 10/01/22 0434  BNP 3,147.0*    DDimer No results for input(s): "DDIMER" in the last 168 hours.   Radiology    ECHOCARDIOGRAM COMPLETE  Result Date: 10/01/2022    ECHOCARDIOGRAM REPORT   Patient Name:   [name] Date of Exam: 10/01/2022 Medical Rec #:  [id number]     Height:       63.0 in Accession #:    [id number]    Weight:       140.4 lb Date of Birth:  [birth year]     BSA:          1.664 m Patient Age:    89 years      BP:           142/62 mmHg Patient Gender: F             HR:           69 bpm. Exam Location:  [facility] Procedure: 2D Echo, 3D Echo, Cardiac Doppler, Intracardiac Opacification Agent            and Strain Analysis Indications:    NSTEMI I21.4  History:        Patient has no prior history of Echocardiogram examinations.                 CHF, NSTEMI, Signs/Symptoms:Altered Mental Status; Risk                 Factors:Hypertension, Diabetes and Non-Smoker.  Sonographer:    Greer Pickerel Referring Phys: [id number] [facility] A Gasper Sells  Sonographer Comments: Image acquisition  challenging due to patient body habitus and Image acquisition challenging due to respiratory motion. Global longitudinal strain was attempted. IMPRESSIONS  1. Left ventricular ejection fraction, by estimation, is 35 to 40%. The left ventricle has moderately decreased function. The left ventricle demonstrates regional wall motion abnormalities (study consistent with LAD infarct.). There is mild concentric left ventricular hypertrophy. Left ventricular diastolic parameters are consistent with Grade II diastolic dysfunction (pseudonormalization). There is the interventricular septum is flattened in systole and diastole, consistent with right ventricular pressure and volume overload.  2. Right ventricular systolic function is normal. The right ventricular size is normal. There is mildly elevated pulmonary artery systolic pressure. The estimated right ventricular systolic pressure is [id number] mmHg.  3. Left atrial size was mildly dilated.  4. Moderate pericardial effusion.  5. The mitral valve is grossly normal. Mild to moderate mitral valve regurgitation.  6. Tricuspid valve regurgitation is moderate.  7. The aortic valve is tricuspid. Aortic valve regurgitation is trivial. Aortic valve sclerosis is present, with no evidence of aortic valve stenosis.  8. The inferior vena cava is normal in size with <50% respiratory variability, suggesting right atrial pressure of 8 mmHg. Comparison(s): No prior Echocardiogram. FINDINGS  Left Ventricle: Left ventricular ejection fraction, by estimation, is 35 to 40%. The left ventricle has moderately decreased function. The left ventricle demonstrates regional wall motion abnormalities. Definity contrast agent was given IV to delineate the left ventricular endocardial borders. The average left ventricular global longitudinal strain is -10.0 %. The global longitudinal strain is abnormal. 3D ejection fraction reviewed and evaluated as part of the interpretation. Alternate measurement of EF is  felt to be most reflective of LV function. The left ventricular internal cavity size was normal in size. There is mild concentric left ventricular hypertrophy. The interventricular septum is flattened in systole and diastole, consistent  with right ventricular pressure and volume overload. Left ventricular diastolic parameters are consistent with Grade II diastolic dysfunction (pseudonormalization).  LV Wall Scoring: The mid and distal anterior septum, inferior septum, apical lateral segment, apical anterior segment, and apical inferior segment are akinetic. The inferior wall, basal anteroseptal segment, and mid anterior segment are hypokinetic. Right Ventricle: The right ventricular size is normal. No increase in right ventricular wall thickness. Right ventricular systolic function is normal. There is mildly elevated pulmonary artery systolic pressure. The tricuspid regurgitant velocity is 2.82  m/s, and with an assumed right atrial pressure of 8 mmHg, the estimated right ventricular systolic pressure is [id number] mmHg. Left Atrium: Left atrial size was mildly dilated. Right Atrium: Right atrial size was normal in size. Pericardium: A moderately sized pericardial effusion is present. Mitral Valve: The mitral valve is grossly normal. Mild to moderate mitral valve regurgitation. Tricuspid Valve: The tricuspid valve is normal in structure. Tricuspid valve regurgitation is moderate. Aortic Valve: The aortic valve is tricuspid. Aortic valve regurgitation is trivial. Aortic valve sclerosis is present, with no evidence of aortic valve stenosis. Pulmonic Valve: The pulmonic valve was not well visualized. Pulmonic valve regurgitation is not visualized. Aorta: The aortic root and ascending aorta are structurally normal, with no evidence of dilitation. Venous: The inferior vena cava is normal in size with less than 50% respiratory variability, suggesting right atrial pressure of 8 mmHg. IAS/Shunts: No atrial level shunt detected by  color flow Doppler.  LEFT VENTRICLE PLAX 2D LVIDd:         4.60 cm   Diastology LVIDs:         3.40 cm   LV e' medial:    4.91 cm/s LV PW:         1.10 cm   LV E/e' medial:  22.8 LV IVS:        1.10 cm   LV e' lateral:   6.13 cm/s LVOT diam:     1.80 cm   LV E/e' lateral: 18.3 LV SV:         55 LV SV Index:   33        2D Longitudinal Strain LVOT Area:     2.54 cm  2D Strain GLS Avg:     -10.0 %                           3D Volume EF:                          3D EF:        46 %                          LV EDV:       143 ml                          LV ESV:       77 ml                          LV SV:        66 ml RIGHT VENTRICLE RV S prime:     9.67 cm/s TAPSE (M-mode): 1.9 cm LEFT ATRIUM             Index        RIGHT ATRIUM  Index LA diam:        3.90 cm 2.34 cm/m   RA Area:     14.20 cm LA Vol (A2C):   67.2 ml 40.39 ml/m  RA Volume:   36.40 ml  21.88 ml/m LA Vol (A4C):   58.8 ml 35.34 ml/m LA Biplane Vol: 64.8 ml 38.95 ml/m  AORTIC VALVE LVOT Vmax:   94.80 cm/s LVOT Vmean:  64.800 cm/s LVOT VTI:    0.218 m  AORTA Ao Root diam: 3.40 cm Ao Asc diam:  3.40 cm MITRAL VALVE                TRICUSPID VALVE MV Area (PHT): 4.60 cm     TR Peak grad:   31.8 mmHg MV Decel Time: 165 msec     TR Vmax:        282.00 cm/s MR Peak grad: 58.7 mmHg MR Vmax:      383.00 cm/s   SHUNTS MV E velocity: 112.00 cm/s  Systemic VTI:  0.22 m MV A velocity: 116.00 cm/s  Systemic Diam: 1.80 cm MV E/A ratio:  0.97 Rudean Haskell MD Electronically signed by Rudean Haskell MD Signature Date/Time: 10/01/2022/1:41:38 PM    Final     Cardiac Studies    Patient Profile     89 y.o. female with history of dementia, HTN, and DM with symptomatic NSTEMI. Planned for medical management   Assessment & Plan    1.NSTEMI - plans for medical management due to advanced age, dementia - peak trop 5816, EKG anteroseptal Qwaves, lateral and inferior ST depressions - echo LVEF 35-40%, grade II dd, The mid and distal anterior  septum, inferior septum, apical lateral segment, apical anterior segment, and apical inferior segment are akinetic. The inferior wall, basal anteroseptal segment, and mid anterior segment are hypokinetic Dshaped septum, mod pericardial effusion   -medical therapy with atorva 40, plavix 75, toprol 25 ASA allergy. Completed 48 hrs of heparin, now off.   2.Acute HFrEF - echo LVEF 35-40%, grade II dd - CXR with pulm edema, BNP 3147   -on IV lasix 60mg  bid, per I/Os +550 yesterday and 2.3 L since admission.   From I/Os +22 yesterday and +1.7L this admission. Labs pending today -check reds vest 27%. Recheck CXR, BNP. Weaning O2 requirement.    - medical therapy with toprol 25mg  daily. Advanced age and GFR 28, would avoid ACE/ARB/ARNI/MRA/SGLT2i at this time. Soft bp's at times, no hydral/nitrates at this time     2.PSVT -continue toprol   3. HTN - soft bp's at times, norvasc was stopped   For questions or updates, please contact [place] Please consult [email or web address] for contact info under        Signed, Carlyle Dolly, MD  10/03/2022, 8:38 AM

## 2022-10-03 NOTE — Progress Notes (Signed)
Patient confused this shift, calling out to family member and stating "I'm mad and knows her niece has stole her money, I'm going to call the police on her for treating me like this." Attempted to reorient patient with little success. Xanax 0.25mg  given and this did calm patient down some. Later in shift patient began to complain of back pain, tylenol given at 0249. Patient slept the rest of the shift. Continued to monitor.

## 2022-10-03 NOTE — Progress Notes (Signed)
   10/03/22 1305  Vitals  Temp 98.4 F (36.9 C)  Temp Source Oral  BP 129/76  BP Location Right Arm  BP Method Automatic  Patient Position (if appropriate) Sitting  Pulse Rate (!) 126  Pulse Rate Source Monitor  Resp (!) 26  MEWS COLOR  MEWS Score Color Red  MEWS Score  MEWS Temp 0  MEWS Systolic 0  MEWS Pulse 2  MEWS RR 2  MEWS LOC 0  MEWS Score 4   Tech alerted me to vitals and rechecked pts vitals improved to base line. MEWS flowsheet not started due to that reason.

## 2022-10-03 NOTE — Progress Notes (Signed)
   10/03/22 0800  ReDS Vest / Clip  Station Marker A  Ruler Value 31  ReDS Value Range < 36  ReDS Actual Value 26

## 2022-10-04 DIAGNOSIS — I214 Non-ST elevation (NSTEMI) myocardial infarction: Secondary | ICD-10-CM | POA: Diagnosis not present

## 2022-10-04 LAB — BASIC METABOLIC PANEL
Anion gap: 9 (ref 5–15)
BUN: 39 mg/dL — ABNORMAL HIGH (ref 8–23)
CO2: 23 mmol/L (ref 22–32)
Calcium: 8.1 mg/dL — ABNORMAL LOW (ref 8.9–10.3)
Chloride: 106 mmol/L (ref 98–111)
Creatinine, Ser: 1.46 mg/dL — ABNORMAL HIGH (ref 0.44–1.00)
GFR, Estimated: 34 mL/min — ABNORMAL LOW (ref >60)
Glucose, Bld: 128 mg/dL — ABNORMAL HIGH (ref 70–99)
Potassium: 3.6 mmol/L (ref 3.5–5.1)
Sodium: 138 mmol/L (ref 135–145)

## 2022-10-04 LAB — GLUCOSE, CAPILLARY
Glucose-Capillary: 117 mg/dL — ABNORMAL HIGH (ref 70–99)
Glucose-Capillary: 168 mg/dL — ABNORMAL HIGH (ref 70–99)

## 2022-10-04 LAB — MAGNESIUM: Magnesium: 1.9 mg/dL (ref 1.7–2.4)

## 2022-10-04 MED ORDER — FUROSEMIDE 40 MG PO TABS: 40.0000 mg | ORAL_TABLET | Freq: Two times a day (BID) | ORAL | 11 refills | Status: AC

## 2022-10-04 MED ORDER — METOPROLOL SUCCINATE ER 25 MG PO TB24: 25.0000 mg | ORAL_TABLET | Freq: Every day | ORAL | 0 refills | Status: AC

## 2022-10-04 MED ORDER — ALPRAZOLAM 0.25 MG PO TABS: 0.2500 mg | ORAL_TABLET | Freq: Three times a day (TID) | ORAL | 0 refills | Status: AC | PRN

## 2022-10-04 MED ORDER — ATORVASTATIN CALCIUM 40 MG PO TABS: 40.0000 mg | ORAL_TABLET | Freq: Every day | ORAL | 0 refills | Status: AC

## 2022-10-04 MED ORDER — CLOPIDOGREL BISULFATE 75 MG PO TABS: 75.0000 mg | ORAL_TABLET | Freq: Every day | ORAL | 0 refills | Status: AC

## 2022-10-04 MED ORDER — NITROGLYCERIN 0.4 MG SL SUBL: 0.4000 mg | SUBLINGUAL_TABLET | SUBLINGUAL | 12 refills | Status: AC | PRN

## 2022-10-04 NOTE — NC FL2 (Addendum)
[place] LEVEL OF CARE FORM     IDENTIFICATION  Patient Name: [name] Birthdate: [birth year] Sex: female Admission Date (Current Location): 09/30/2022  [facility] and [place] Number:  [employer] and Address:  [facility] [address] [place]      Provider Number: [id number]  Attending Physician Name and Address:  Rodena Goldmann, DO  Relative Name and Phone Number:  Jaynie Bream (Granddaughter) [phone]    Current Level of Care: Hospital (observation) Recommended Level of Care: [place] Prior Approval Number:    Date Approved/Denied:   PASRR Number:    Discharge Plan: Domiciliary (Rest home) (ALF)    Current Diagnoses: Patient Active Problem List   Diagnosis Date Noted   NSTEMI (non-ST elevated myocardial infarction) ([place]) 09/30/2022   DM (diabetes mellitus) ([place]) 09/30/2022   Dementia ([place]) 09/30/2022   Bilateral primary osteoarthritis of knee 06/13/2020   PNA (pneumonia)    UTI (urinary tract infection) 09/14/2019   Pneumonia due to COVID-19 virus 07/22/2019   Acute on chronic diastolic CHF (congestive heart failure) ([place]) 05/06/2017   Altered mental status    Acute respiratory failure ([place]) 05/04/2017   Hypoglycemia 04/30/2017   Acute urinary retention 02/23/2017   Chronic diastolic CHF (congestive heart failure) (HCC)    SBO (small bowel obstruction) ([place]) 02/22/2017   Hypertensive urgency 02/22/2017   Leukocytosis 02/22/2017   Hyponatremia 02/22/2017   Dehydration 02/22/2017   Dizziness 09/01/2016   Weakness 09/01/2016   Psoriasis vulgaris 09/01/2016   Right hip pain 09/01/2016   Diabetic foot ulcer ([place]) 09/01/2016   Congestive heart failure (CHF) ([place]) 05/15/2016   Rash and nonspecific skin eruption 05/15/2016   Essential hypertension 05/15/2016   Uncontrolled type 2 diabetes mellitus with hyperglycemia ([place]) 05/15/2016   Anxiety 05/15/2016   Acute respiratory failure with  hypoxia ([place]) 05/15/2016    Orientation RESPIRATION BLADDER Height & Weight     Self  Normal Incontinent Weight: 65.3 kg Height:  5\' 3"  (160 cm)  BEHAVIORAL SYMPTOMS/MOOD NEUROLOGICAL BOWEL NUTRITION STATUS      Incontinent Diet (heart healthy/carb modified)  AMBULATORY STATUS COMMUNICATION OF NEEDS Skin   Limited Assist Verbally PU Stage and Appropriate Care (diabetic ulcer right leg)                       Personal Care Assistance Level of Assistance  Bathing, Feeding, Dressing Bathing Assistance: Limited assistance Feeding assistance: Independent Dressing Assistance: Limited assistance     Functional Limitations Info  Sight, Hearing, Speech Sight Info: Adequate Hearing Info: Adequate Speech Info: Adequate    SPECIAL CARE FACTORS FREQUENCY                       Contractures Contractures Info: Not present    Additional Factors Info  Code Status, Allergies, Psychotropic Code Status Info: DNR Allergies Info: Ace Inhibitors, Aspirin, Glipizide, Neosporin (Neomycin-bacitracin Zn-polymyx), Spironolactone, Diovan (Valsartan), Keflex (Cephalexin) Psychotropic Info: Xanax         Current Medications (10/04/2022):  This is the current hospital active medication list Current Facility-Administered Medications  Medication Dose Route Frequency Provider Last Rate Last Admin   acetaminophen (TYLENOL) tablet 650 mg  650 mg Oral Q6H PRN Emokpae, Ejiroghene E, MD   650 mg at 10/04/22 1320   Or   acetaminophen (TYLENOL) suppository 650 mg  650 mg Rectal Q6H PRN Emokpae, Ejiroghene E, MD       allopurinol (ZYLOPRIM)  tablet 100 mg  100 mg Oral Daily Emokpae, Ejiroghene E, MD   100 mg at 10/04/22 0843   ALPRAZolam Duanne Moron) tablet 0.25 mg  0.25 mg Oral BID PRN Heath Lark D, DO   0.25 mg at 10/02/22 2228   atorvastatin (LIPITOR) tablet 40 mg  40 mg Oral Daily Emokpae, Ejiroghene E, MD   40 mg at 10/04/22 0843   cefTRIAXone (ROCEPHIN) 1 g in sodium chloride 0.9 % 100 mL IVPB  1  g Intravenous Q24H Emokpae, Ejiroghene E, MD 200 mL/hr at 10/04/22 0028 1 g at 10/04/22 0028   clopidogrel (PLAVIX) tablet 75 mg  75 mg Oral Daily Emokpae, Ejiroghene E, MD   75 mg at 10/04/22 0843   diphenhydrAMINE (BENADRYL) injection 25 mg  25 mg Intravenous Once PRN Emokpae, Ejiroghene E, MD       doxycycline (VIBRAMYCIN) 100 mg in sodium chloride 0.9 % 250 mL IVPB  100 mg Intravenous Q12H Emokpae, Ejiroghene E, MD 125 mL/hr at 10/04/22 1136 100 mg at 10/04/22 1136   heparin injection 5,000 Units  5,000 Units Subcutaneous Q8H Arnoldo Lenis, MD   5,000 Units at 10/04/22 1320   insulin aspart (novoLOG) injection 0-5 Units  0-5 Units Subcutaneous QHS Emokpae, Ejiroghene E, MD   2 Units at 09/30/22 2252   insulin aspart (novoLOG) injection 0-9 Units  0-9 Units Subcutaneous TID WC Emokpae, Ejiroghene E, MD   2 Units at 10/04/22 1136   ipratropium-albuterol (DUONEB) 0.5-2.5 (3) MG/3ML nebulizer solution 3 mL  3 mL Nebulization Q4H PRN Emokpae, Ejiroghene E, MD   3 mL at 10/03/22 2221   metoprolol succinate (TOPROL-XL) 24 hr tablet 25 mg  25 mg Oral Daily Chandrasekhar, Mahesh A, MD   25 mg at 10/04/22 0843   nitroGLYCERIN (NITROSTAT) SL tablet 0.4 mg  0.4 mg Sublingual Q5 min PRN Emokpae, Ejiroghene E, MD       ondansetron (ZOFRAN) injection 4 mg  4 mg Intravenous Q8H PRN Emokpae, Ejiroghene E, MD   4 mg at 10/02/22 1316   polyethylene glycol (MIRALAX / GLYCOLAX) packet 17 g  17 g Oral Daily PRN Emokpae, Ejiroghene E, MD         Discharge Medications: Medication List       STOP taking these medications        amLODipine 10 MG tablet Commonly known as: NORVASC    carvedilol 6.25 MG tablet Commonly known as: COREG    pravastatin 20 MG tablet Commonly known as: PRAVACHOL           TAKE these medications     acetaminophen 325 MG tablet Commonly known as: TYLENOL Take 2 tablets (650 mg total) by mouth in the morning, at noon, and at bedtime.    allopurinol 100 MG  tablet Commonly known as: ZYLOPRIM Take 100 mg by mouth daily.    atorvastatin 40 MG tablet Commonly known as: LIPITOR Take 1 tablet (40 mg total) by mouth daily. Start taking on: October 05, 2022    clopidogrel 75 MG tablet Commonly known as: PLAVIX Take 1 tablet (75 mg total) by mouth daily. Start taking on: October 05, 2022    docusate sodium 100 MG capsule Commonly known as: COLACE Take 1 capsule (100 mg total) by mouth daily.    fenofibrate 48 MG tablet Commonly known as: TRICOR Take 48 mg by mouth daily.    furosemide 40 MG tablet Commonly known as: Lasix Take 1 tablet (40 mg total) by mouth 2 (two) times daily.  What changed: when to take this    HumaLOG KwikPen 200 UNIT/ML KwikPen Generic drug: insulin lispro Inject 0-15 Units into the skin 3 (three) times daily with meals. CBG 70-120--no units; 121-150--2 units; 151-200--3 units; 201-250--5 units; 251-300--8 units; 301-350--11 units; 351-400--15 units    insulin glargine 100 UNIT/ML injection Commonly known as: LANTUS Inject 0.2 mLs (20 Units total) into the skin at bedtime.    linagliptin 5 MG Tabs tablet Commonly known as: Tradjenta Take 1 tablet (5 mg total) by mouth daily.    metFORMIN 500 MG 24 hr tablet Commonly known as: GLUCOPHAGE-XR Take 500 mg by mouth 2 (two) times daily.    metoprolol succinate 25 MG 24 hr tablet Commonly known as: TOPROL-XL Take 1 tablet (25 mg total) by mouth daily. Start taking on: October 05, 2022    nitroGLYCERIN 0.4 MG SL tablet Commonly known as: NITROSTAT Place 1 tablet (0.4 mg total) under the tongue every 5 (five) minutes as needed for chest pain.    potassium chloride 10 MEQ tablet Commonly known as: KLOR-CON M Take 1 tablet (10 mEq total) by mouth daily.    triamcinolone cream 0.1 % Commonly known as: KENALOG APPLY TWICE DAILY    Vitamin D (Ergocalciferol) 1.25 MG (50000 UNIT) Caps capsule Commonly known as: DRISDOL Take 50,000 Units by mouth once a week.      ALPRAZolam 0.25 MG tablet ake 1 tablet (0.25 mg total) by mouth 3 (three) times daily as needed for anxiety.  Commonly known asDuanne Moron  Relevant Imaging Results:  Relevant Lab Results:   Additional Information    Boneta Lucks, RN

## 2022-10-04 NOTE — Discharge Summary (Addendum)
Physician Discharge Summary  [name] [id number] DOB: [birth year] DOA: 09/30/2022  PCP: Pcp, No  Admit date: 09/30/2022  Discharge date: 10/04/2022  Admitted From:ALF  Disposition:  ALF  Recommendations for Outpatient Follow-up:  Follow up with PCP in 1-2 weeks Continue on Toprol, Plavix, and atorvastatin as prescribed as well as Lasix twice daily Nitroglycerin provided as needed for chest pain  Home Health: Yes with PT  Equipment/Devices: None  Discharge Condition:Stable  CODE STATUS: DNR  Diet recommendation: Heart Healthy/carb modified  Brief/Interim Summary:  [name] is a 89 y.o. female with medical history significant for diastolic CHF, diabetic foot, hypertension, psoriasis, dementia. Patient was brought to the ED from home with reports of severe chest pain that started yesterday, she reports feeling of indigestion, pain radiating down her right arm.  Patient was admitted with NSTEMI and started on heparin drip.  She is not wanting any interventional procedures and prefers medical management.  Cardiology consulted with plans to continue heparin drip for 48 hours and noted to have NSTEMI with LAD infarct noted on 2D echocardiogram 3/20.  She has weaned off of oxygen and has diuresed appropriately and is now euvolemic.  PT recommending home health services and she is now in stable condition for discharge with no further follow-up recommended.  She will remain on medications as noted below.  Discharge Diagnoses:  Principal Problem:   NSTEMI (non-ST elevated myocardial infarction) ([place]) Active Problems:   Chronic diastolic CHF (congestive heart failure) ([facility])   PNA (pneumonia)   Essential hypertension   DM (diabetes mellitus) ([place])   Dementia ([place])  Principal discharge diagnosis: Acute HFrEF secondary to NSTEMI.  Discharge Instructions  Discharge Instructions     Diet - low sodium heart healthy   Complete by: As directed    Increase activity slowly    Complete by: As directed    No wound care   Complete by: As directed       Allergies as of 10/04/2022       Reactions   Ace Inhibitors Dermatitis   Aspirin    Glipizide    rash   Neosporin [neomycin-bacitracin Zn-polymyx] Dermatitis   Spironolactone    rash   Diovan [valsartan] Rash   Keflex [cephalexin] Rash        Medication List     STOP taking these medications    amLODipine 10 MG tablet Commonly known as: NORVASC   carvedilol 6.25 MG tablet Commonly known as: COREG   pravastatin 20 MG tablet Commonly known as: PRAVACHOL       TAKE these medications    acetaminophen 325 MG tablet Commonly known as: TYLENOL Take 2 tablets (650 mg total) by mouth in the morning, at noon, and at bedtime.   allopurinol 100 MG tablet Commonly known as: ZYLOPRIM Take 100 mg by mouth daily.   ALPRAZolam 0.25 MG tablet Commonly known as: XANAX Take 1 tablet (0.25 mg total) by mouth 3 (three) times daily as needed for anxiety. What changed:  medication strength how much to take reasons to take this   atorvastatin 40 MG tablet Commonly known as: LIPITOR Take 1 tablet (40 mg total) by mouth daily. Start taking on: October 05, 2022   clopidogrel 75 MG tablet Commonly known as: PLAVIX Take 1 tablet (75 mg total) by mouth daily. Start taking on: October 05, 2022   docusate sodium 100 MG capsule Commonly known as: COLACE Take 1 capsule (100 mg total) by mouth daily.   fenofibrate 48 MG  tablet Commonly known as: TRICOR Take 48 mg by mouth daily.   furosemide 40 MG tablet Commonly known as: Lasix Take 1 tablet (40 mg total) by mouth 2 (two) times daily. What changed: when to take this   HumaLOG KwikPen 200 UNIT/ML KwikPen Generic drug: insulin lispro Inject 0-15 Units into the skin 3 (three) times daily with meals. CBG 70-120--no units; 121-150--2 units; 151-200--3 units; 201-250--5 units; 251-300--8 units; 301-350--11 units; 351-400--15 units   insulin glargine 100  UNIT/ML injection Commonly known as: LANTUS Inject 0.2 mLs (20 Units total) into the skin at bedtime.   linagliptin 5 MG Tabs tablet Commonly known as: Tradjenta Take 1 tablet (5 mg total) by mouth daily.   metFORMIN 500 MG 24 hr tablet Commonly known as: GLUCOPHAGE-XR Take 500 mg by mouth 2 (two) times daily.   metoprolol succinate 25 MG 24 hr tablet Commonly known as: TOPROL-XL Take 1 tablet (25 mg total) by mouth daily. Start taking on: October 05, 2022   nitroGLYCERIN 0.4 MG SL tablet Commonly known as: NITROSTAT Place 1 tablet (0.4 mg total) under the tongue every 5 (five) minutes as needed for chest pain.   potassium chloride 10 MEQ tablet Commonly known as: KLOR-CON M Take 1 tablet (10 mEq total) by mouth daily.   triamcinolone cream 0.1 % Commonly known as: KENALOG APPLY TWICE DAILY   Vitamin D (Ergocalciferol) 1.25 MG (50000 UNIT) Caps capsule Commonly known as: DRISDOL Take 50,000 Units by mouth once a week.        Contact information for follow-up providers     [facility].   Why: PT/RN will call to schedule your next home visit.             Contact information for after-discharge care     Destination     [facility] .   Service: Assisted Living Contact information: [place] [address] [phone]                    Allergies  Allergen Reactions   Ace Inhibitors Dermatitis   Aspirin    Glipizide     rash   Neosporin [Neomycin-Bacitracin Zn-Polymyx] Dermatitis   Spironolactone     rash   Diovan [Valsartan] Rash   Keflex [Cephalexin] Rash    Consultations: Cardiology   Procedures/Studies: DG CHEST PORT 1 VIEW  Result Date: 10/03/2022 CLINICAL DATA:  Dyspnea. EXAM: PORTABLE CHEST 1 VIEW COMPARISON:  September 30, 2022. FINDINGS: Stable cardiomediastinal silhouette. Left pleural effusion is noted with associated left basilar atelectasis or infiltrate. Minimal  right basilar subsegmental atelectasis is noted. Degenerative changes are seen involving both shoulders. IMPRESSION: Left pleural effusion with associated left basilar atelectasis or infiltrate. Minimal right basilar subsegmental atelectasis. Electronically Signed   By: Marijo Conception M.D.   On: 10/03/2022 12:11   ECHOCARDIOGRAM COMPLETE  Result Date: 10/01/2022    ECHOCARDIOGRAM REPORT   Patient Name:   [name] Date of Exam: 10/01/2022 Medical Rec #:  [id number]     Height:       63.0 in Accession #:    [id number]    Weight:       140.4 lb Date of Birth:  01/29/1931     BSA:          1.664 m Patient Age:    89 years      BP:           142/62  mmHg Patient Gender: F             HR:           69 bpm. Exam Location:  [facility] Procedure: 2D Echo, 3D Echo, Cardiac Doppler, Intracardiac Opacification Agent            and Strain Analysis Indications:    NSTEMI I21.4  History:        Patient has no prior history of Echocardiogram examinations.                 CHF, NSTEMI, Signs/Symptoms:Altered Mental Status; Risk                 Factors:Hypertension, Diabetes and Non-Smoker.  Sonographer:    Greer Pickerel Referring Phys: [id number] [facility] A Gasper Sells  Sonographer Comments: Image acquisition challenging due to patient body habitus and Image acquisition challenging due to respiratory motion. Global longitudinal strain was attempted. IMPRESSIONS  1. Left ventricular ejection fraction, by estimation, is 35 to 40%. The left ventricle has moderately decreased function. The left ventricle demonstrates regional wall motion abnormalities (study consistent with LAD infarct.). There is mild concentric left ventricular hypertrophy. Left ventricular diastolic parameters are consistent with Grade II diastolic dysfunction (pseudonormalization). There is the interventricular septum is flattened in systole and diastole, consistent with right ventricular pressure and volume overload.  2. Right ventricular systolic function is  normal. The right ventricular size is normal. There is mildly elevated pulmonary artery systolic pressure. The estimated right ventricular systolic pressure is [id number] mmHg.  3. Left atrial size was mildly dilated.  4. Moderate pericardial effusion.  5. The mitral valve is grossly normal. Mild to moderate mitral valve regurgitation.  6. Tricuspid valve regurgitation is moderate.  7. The aortic valve is tricuspid. Aortic valve regurgitation is trivial. Aortic valve sclerosis is present, with no evidence of aortic valve stenosis.  8. The inferior vena cava is normal in size with <50% respiratory variability, suggesting right atrial pressure of 8 mmHg. Comparison(s): No prior Echocardiogram. FINDINGS  Left Ventricle: Left ventricular ejection fraction, by estimation, is 35 to 40%. The left ventricle has moderately decreased function. The left ventricle demonstrates regional wall motion abnormalities. Definity contrast agent was given IV to delineate the left ventricular endocardial borders. The average left ventricular global longitudinal strain is -10.0 %. The global longitudinal strain is abnormal. 3D ejection fraction reviewed and evaluated as part of the interpretation. Alternate measurement of EF is felt to be most reflective of LV function. The left ventricular internal cavity size was normal in size. There is mild concentric left ventricular hypertrophy. The interventricular septum is flattened in systole and diastole, consistent with right ventricular pressure and volume overload. Left ventricular diastolic parameters are consistent with Grade II diastolic dysfunction (pseudonormalization).  LV Wall Scoring: The mid and distal anterior septum, inferior septum, apical lateral segment, apical anterior segment, and apical inferior segment are akinetic. The inferior wall, basal anteroseptal segment, and mid anterior segment are hypokinetic. Right Ventricle: The right ventricular size is normal. No increase in right  ventricular wall thickness. Right ventricular systolic function is normal. There is mildly elevated pulmonary artery systolic pressure. The tricuspid regurgitant velocity is 2.82  m/s, and with an assumed right atrial pressure of 8 mmHg, the estimated right ventricular systolic pressure is [id number] mmHg. Left Atrium: Left atrial size was mildly dilated. Right Atrium: Right atrial size was normal in size. Pericardium: A moderately sized pericardial effusion is present. Mitral Valve: The mitral valve is grossly normal.  Mild to moderate mitral valve regurgitation. Tricuspid Valve: The tricuspid valve is normal in structure. Tricuspid valve regurgitation is moderate. Aortic Valve: The aortic valve is tricuspid. Aortic valve regurgitation is trivial. Aortic valve sclerosis is present, with no evidence of aortic valve stenosis. Pulmonic Valve: The pulmonic valve was not well visualized. Pulmonic valve regurgitation is not visualized. Aorta: The aortic root and ascending aorta are structurally normal, with no evidence of dilitation. Venous: The inferior vena cava is normal in size with less than 50% respiratory variability, suggesting right atrial pressure of 8 mmHg. IAS/Shunts: No atrial level shunt detected by color flow Doppler.  LEFT VENTRICLE PLAX 2D LVIDd:         4.60 cm   Diastology LVIDs:         3.40 cm   LV e' medial:    4.91 cm/s LV PW:         1.10 cm   LV E/e' medial:  22.8 LV IVS:        1.10 cm   LV e' lateral:   6.13 cm/s LVOT diam:     1.80 cm   LV E/e' lateral: 18.3 LV SV:         55 LV SV Index:   33        2D Longitudinal Strain LVOT Area:     2.54 cm  2D Strain GLS Avg:     -10.0 %                           3D Volume EF:                          3D EF:        46 %                          LV EDV:       143 ml                          LV ESV:       77 ml                          LV SV:        66 ml RIGHT VENTRICLE RV S prime:     9.67 cm/s TAPSE (M-mode): 1.9 cm LEFT ATRIUM             Index        RIGHT  ATRIUM           Index LA diam:        3.90 cm 2.34 cm/m   RA Area:     14.20 cm LA Vol (A2C):   67.2 ml 40.39 ml/m  RA Volume:   36.40 ml  21.88 ml/m LA Vol (A4C):   58.8 ml 35.34 ml/m LA Biplane Vol: 64.8 ml 38.95 ml/m  AORTIC VALVE LVOT Vmax:   94.80 cm/s LVOT Vmean:  64.800 cm/s LVOT VTI:    0.218 m  AORTA Ao Root diam: 3.40 cm Ao Asc diam:  3.40 cm MITRAL VALVE                TRICUSPID VALVE MV Area (PHT): 4.60 cm     TR Peak grad:   31.8 mmHg MV Decel Time: 165 msec  TR Vmax:        282.00 cm/s MR Peak grad: 58.7 mmHg MR Vmax:      383.00 cm/s   SHUNTS MV E velocity: 112.00 cm/s  Systemic VTI:  0.22 m MV A velocity: 116.00 cm/s  Systemic Diam: 1.80 cm MV E/A ratio:  0.97 Rudean Haskell MD Electronically signed by Rudean Haskell MD Signature Date/Time: 10/01/2022/1:41:38 PM    Final    DG CHEST PORT 1 VIEW  Result Date: 09/30/2022 CLINICAL DATA:  Dyspnea. EXAM: PORTABLE CHEST 1 VIEW COMPARISON:  Earlier today FINDINGS: Stable cardiomegaly. Unchanged mediastinal contours. Development of pulmonary edema from earlier today. Increasing retrocardiac opacity and blunting of the costophrenic angle, likely combination of pleural effusion and atelectasis/airspace disease. No pneumothorax. Thoracic spondylosis and bilateral glenohumeral arthropathy. IMPRESSION: 1. Development of pulmonary edema from earlier today. 2. Increasing retrocardiac opacity and blunting of the costophrenic angle, likely combination of pleural effusion and atelectasis/airspace disease. Electronically Signed   By: Keith Rake M.D.   On: 09/30/2022 23:09   DG Chest Port 1 View  Result Date: 09/30/2022 CLINICAL DATA:  Indigestion. Chest discomfort radiating down the right arm. EXAM: PORTABLE CHEST 1 VIEW COMPARISON:  09/14/2019 FINDINGS: Apical lordotic projection. Mild enlargement of the cardiopericardial silhouette. Indistinct obscuration left hemidiaphragm in the retrocardiac region, favored to represent  atelectasis or pneumonia although a component of this appearance may be related to the apical lordotic projection obscuring the diaphragm. Substantial degenerative glenohumeral arthropathy bilaterally. Thoracic spondylosis. IMPRESSION: 1. Mild enlargement of the cardiopericardial silhouette, without edema. 2. Indistinct left hemidiaphragm in the retrocardiac region, favored to represent atelectasis or pneumonia. 3. Thoracic spondylosis. 4. Substantial degenerative glenohumeral arthropathy bilaterally. Electronically Signed   By: Van Clines M.D.   On: 09/30/2022 16:25     Discharge Exam: Vitals:   10/04/22 0417 10/04/22 1242  BP: 136/72 135/70  Pulse: 78 75  Resp: (!) 22 (!) 21  Temp: 98.5 F (36.9 C) 98.7 F (37.1 C)  SpO2: 93% 95%   Vitals:   10/03/22 2221 10/04/22 0417 10/04/22 0900 10/04/22 1242  BP:  136/72  135/70  Pulse:  78  75  Resp:  (!) 22  (!) 21  Temp:  98.5 F (36.9 C)  98.7 F (37.1 C)  TempSrc:    Oral  SpO2: 95% 93%  95%  Weight:   65.3 kg   Height:        General: Pt is alert, awake, not in acute distress, hard of hearing Cardiovascular: RRR, S1/S2 +, no rubs, no gallops Respiratory: CTA bilaterally, no wheezing, no rhonchi Abdominal: Soft, NT, ND, bowel sounds + Extremities: no edema, no cyanosis    The results of significant diagnostics from this hospitalization (including imaging, microbiology, ancillary and laboratory) are listed below for reference.     Microbiology: Recent Results (from the past 240 hour(s))  MRSA Next Gen by PCR, Nasal     Status: None   Collection Time: 09/30/22  6:17 PM   Specimen: Nasal Mucosa; Nasal Swab  Result Value Ref Range Status   MRSA by PCR Next Gen NOT DETECTED NOT DETECTED Final    Comment: (NOTE) The GeneXpert MRSA Assay (FDA approved for NASAL specimens only), is one component of a comprehensive MRSA colonization surveillance program. It is not intended to diagnose MRSA infection nor to guide or monitor  treatment for MRSA infections. Test performance is not FDA approved in patients less than 87 years old. Performed at [facility], [address], [place], [place]  [id number]      Labs: BNP (last 3 results) Recent Labs    10/01/22 0434 10/03/22 0913  BNP 3,147.0* [id number]*   Basic Metabolic Panel: Recent Labs  Lab 09/30/22 1336 10/01/22 0434 10/02/22 0411 10/03/22 0913 10/04/22 0519  NA 136 137 137 134* 138  K 3.7 4.2 3.5 3.4* 3.6  CL 104 104 104 100 106  CO2 21* 22 20* 22 23  GLUCOSE 104* 114* 241* 205* 128*  BUN 43* 42* 43* 37* 39*  CREATININE 1.66* 1.66* 1.75* 1.44* 1.46*  CALCIUM 7.9* 8.0* 8.2* 8.1* 8.1*  MG  --   --  2.2 1.9 1.9   Liver Function Tests: Recent Labs  Lab 09/30/22 1336  AST 23  ALT 14  ALKPHOS 55  BILITOT 0.5  PROT 6.6  ALBUMIN 2.9*   No results for input(s): "LIPASE", "AMYLASE" in the last 168 hours. No results for input(s): "AMMONIA" in the last 168 hours. CBC: Recent Labs  Lab 09/30/22 1336 10/01/22 0434 10/02/22 0411  WBC 8.3 10.7* 12.3*  NEUTROABS 6.5  --   --   HGB 10.4* 10.8* 10.9*  HCT 31.7* 33.5* 34.7*  MCV 93.0 93.1 94.8  PLT 316 359 426*   Cardiac Enzymes: No results for input(s): "CKTOTAL", "CKMB", "CKMBINDEX", "TROPONINI" in the last 168 hours. BNP: Invalid input(s): "POCBNP" CBG: Recent Labs  Lab 10/03/22 1112 10/03/22 1706 10/03/22 2024 10/04/22 0738 10/04/22 1110  GLUCAP 155* 166* 168* 117* 168*   D-Dimer No results for input(s): "DDIMER" in the last 72 hours. Hgb A1c No results for input(s): "HGBA1C" in the last 72 hours. Lipid Profile No results for input(s): "CHOL", "HDL", "LDLCALC", "TRIG", "CHOLHDL", "LDLDIRECT" in the last 72 hours. Thyroid function studies No results for input(s): "TSH", "T4TOTAL", "T3FREE", "THYROIDAB" in the last 72 hours.  Invalid input(s): "FREET3" Anemia work up No results for input(s): "VITAMINB12", "FOLATE", "FERRITIN", "TIBC", "IRON", "RETICCTPCT" in the last 72  hours. Urinalysis    Component Value Date/Time   COLORURINE YELLOW 09/11/2019 1346   APPEARANCEUR HAZY (A) 09/11/2019 1346   LABSPEC 1.009 09/11/2019 1346   PHURINE 6.0 09/11/2019 1346   GLUCOSEU NEGATIVE 09/11/2019 1346   HGBUR NEGATIVE 09/11/2019 1346   BILIRUBINUR NEGATIVE 09/11/2019 1346   KETONESUR NEGATIVE 09/11/2019 1346   PROTEINUR NEGATIVE 09/11/2019 1346   UROBILINOGEN 0.2 08/16/2010 2011   NITRITE POSITIVE (A) 09/11/2019 1346   LEUKOCYTESUR SMALL (A) 09/11/2019 1346   Sepsis Labs Recent Labs  Lab 09/30/22 1336 10/01/22 0434 10/02/22 0411  WBC 8.3 10.7* 12.3*   Microbiology Recent Results (from the past 240 hour(s))  MRSA Next Gen by PCR, Nasal     Status: None   Collection Time: 09/30/22  6:17 PM   Specimen: Nasal Mucosa; Nasal Swab  Result Value Ref Range Status   MRSA by PCR Next Gen NOT DETECTED NOT DETECTED Final    Comment: (NOTE) The GeneXpert MRSA Assay (FDA approved for NASAL specimens only), is one component of a comprehensive MRSA colonization surveillance program. It is not intended to diagnose MRSA infection nor to guide or monitor treatment for MRSA infections. Test performance is not FDA approved in patients less than 46 years old. Performed at [facility], [address], [place], [place] [address]      Time coordinating discharge: 35 minutes  SIGNED:   Rodena Goldmann, DO [facility] 10/04/2022, 2:58 PM  If 7PM-7AM, please contact night-coverage [email or web address]

## 2022-10-04 NOTE — Progress Notes (Signed)
SATURATION QUALIFICATIONS: (This note is used to comply with regulatory documentation for home oxygen)  Patient Saturations on Room Air at Rest = 97%  Patient Saturations on Room Air while Ambulating = 92%   

## 2022-10-04 NOTE — TOC Transition Note (Signed)
Transition of Care [facility]) - CM/SW Discharge Note   Patient Details  Name: SHANVI MCALISTER MRN: [id number] Date of Birth: 06-Feb-1931  [facility] CM/SW Contact:  Boneta Lucks, RN Phone Number: 10/04/2022, 2:03 PM   Clinical Narrative:   Patient medically ready to return to [facility], [facility] spoke with Charmian Muff and DC summary faxed. Granddaughter will come to assist with getting patient ready. She is requesting [facility] transport because they feel she will refuse to go in. RN updated. TOC will call [facility] when patient is ready for discharge.   Final next level of care: Assisted Living Barriers to Discharge: Barriers Resolved   Discharge Placement   [facility] and Services Additional resources added to the After Visit Summary for                Social Determinants of Health (SDOH) Interventions SDOH Screenings   Food Insecurity: No Food Insecurity (09/30/2022)  Housing: [place]  (09/30/2022)  Transportation Needs: No Transportation Needs (09/30/2022)  Utilities: Not At Risk (09/30/2022)  Tobacco Use: Low Risk  (10/02/2022)    Readmission Risk Interventions     No data to display

## 2022-10-04 NOTE — Evaluation (Signed)
Physical Therapy Evaluation Patient Details Name: [name] MRN: [id number] DOB: 15-Aug-1930 Today's Date: 10/04/2022  History of Present Illness  [name] is a 89 y.o. female with medical history significant for diastolic CHF, diabetic foot, hypertension, psoriasis, dementia.  Patient was brought to the ED from home with reports of severe chest pain she reports feeling of indigestion, pain radiating down her right arm.  Patient was admitted with NSTEMI and started on heparin drip.  She is not wanting any interventional procedures and prefers medical management  Clinical Impression  PT mod I in bed mobility needing contact guard for ambulation.  Pt SOB with 30 ft of ambulation.  Pt  resides at assistive living recommend [facility] .      Recommendations for follow up therapy are one component of a multi-disciplinary discharge planning process, led by the attending physician.  Recommendations may be updated based on patient status, additional functional criteria and insurance authorization.  Follow Up Recommendations Home health PT      Assistance Recommended at Discharge Intermittent Supervision/Assistance  Patient can return home with the following  A little help with walking and/or transfers;Assistance with cooking/housework;Direct supervision/assist for medications management;A little help with bathing/dressing/bathroom    Equipment Recommendations None recommended by PT  Recommendations for Other Services       Functional Status Assessment Patient has had a recent decline in their functional status and demonstrates the ability to make significant improvements in function in a reasonable and predictable amount of time.     Precautions / Restrictions Precautions Precautions: Fall      Mobility  Bed Mobility Overal bed mobility: Modified Independent                  Transfers Overall transfer level: Needs assistance   Transfers: Sit to/from Stand, Bed to  chair/wheelchair/BSC Sit to Stand: Supervision Stand pivot transfers: Supervision              Ambulation/Gait Ambulation/Gait assistance: Min guard Gait Distance (Feet): 30 Feet Assistive device: Rolling walker (2 wheels) Gait Pattern/deviations: Decreased step length - right, Decreased step length - left                Balance                                             Pertinent Vitals/Pain Pain Assessment Pain Assessment: Faces Faces Pain Scale: Hurts little more Pain Location: right leg Pain Descriptors / Indicators: Aching Pain Intervention(s): Limited activity within patient's tolerance    Home Living                          Prior Function                       Hand Dominance        Extremity/Trunk Assessment        Lower Extremity Assessment Lower Extremity Assessment: Generalized weakness       Communication      Cognition Arousal/Alertness: Awake/alert Behavior During Therapy: WFL for tasks assessed/performed Overall Cognitive Status: History of cognitive impairments - at baseline  General Comments      Exercises     Assessment/Plan    PT Assessment Patient needs continued PT services  PT Problem List Decreased strength;Decreased activity tolerance       PT Treatment Interventions Therapeutic exercise;Gait training    PT Goals (Current goals can be found in the Care Plan section)       Frequency Min 3X/week     Co-evaluation               AM-PAC PT "6 Clicks" Mobility  Outcome Measure Help needed turning from your back to your side while in a flat bed without using bedrails?: None Help needed moving from lying on your back to sitting on the side of a flat bed without using bedrails?: None Help needed moving to and from a bed to a chair (including a wheelchair)?: A Little Help needed standing up from a chair using  your arms (e.g., wheelchair or bedside chair)?: A Little Help needed to walk in hospital room?: A Little Help needed climbing 3-5 steps with a railing? : A Lot 6 Click Score: 19    End of Session Equipment Utilized During Treatment: Gait belt Activity Tolerance: Patient tolerated treatment well;No increased pain Patient left: in chair;with call bell/phone within reach;with family/visitor present Nurse Communication: Mobility status PT Visit Diagnosis: Unsteadiness on feet (R26.81);Muscle weakness (generalized) (M62.81)    Time: 1320-1340 PT Time Calculation (min) (ACUTE ONLY): 20 min   Charges:   PT Evaluation $PT Eval Low Complexity: [place], PT CLT [phone]   10/04/2022, 1:44 PM

## 2022-10-07 DIAGNOSIS — I872 Venous insufficiency (chronic) (peripheral): Secondary | ICD-10-CM | POA: Diagnosis not present

## 2022-10-07 DIAGNOSIS — Z4800 Encounter for change or removal of nonsurgical wound dressing: Secondary | ICD-10-CM | POA: Diagnosis not present

## 2022-10-07 DIAGNOSIS — I214 Non-ST elevation (NSTEMI) myocardial infarction: Secondary | ICD-10-CM | POA: Diagnosis not present

## 2022-10-07 DIAGNOSIS — L97811 Non-pressure chronic ulcer of other part of right lower leg limited to breakdown of skin: Secondary | ICD-10-CM | POA: Diagnosis not present

## 2022-10-07 DIAGNOSIS — E1151 Type 2 diabetes mellitus with diabetic peripheral angiopathy without gangrene: Secondary | ICD-10-CM | POA: Diagnosis not present

## 2022-10-07 DIAGNOSIS — I5032 Chronic diastolic (congestive) heart failure: Secondary | ICD-10-CM | POA: Diagnosis not present

## 2022-10-08 ENCOUNTER — Ambulatory Visit: Payer: Self-pay | Admitting: *Deleted

## 2022-10-08 ENCOUNTER — Encounter: Payer: Self-pay | Admitting: *Deleted

## 2022-10-08 NOTE — Patient Instructions (Signed)
Visit Information  Thank you for taking time to visit with me today. Please don't hesitate to contact me if I can be of assistance to you.   Please call the care guide team at [phone] if you need to cancel or reschedule your appointment.   If you are experiencing a Mental Health or Behavioral Health Crisis or need someone to talk to, please call the Suicide and Crisis Lifeline: 988 call the [place] National Suicide Prevention Lifeline: 1-800-273-8255 or TTY: 1-800-799-4 TTY (1-800-799-4889) to talk to a trained counselor call 1-800-273-TALK (toll free, 24 hour hotline) go to [facility] [address], [place] ([phone]) call the [place] Crisis Line: 800-939-9988 call 911  Patient verbalizes understanding of instructions and care plan provided today and agrees to view in MyChart. Active MyChart status and patient understanding of how to access instructions and care plan via MyChart confirmed with patient.     No further follow up required.  Latorsha Curling, BSW, MSW, LCSW  Licensed Clinical Social Worker  [facility]  Mailing Address-[address], [place], [place] [address] Physical Address-[address], [place], [place] [address] Toll Free Main # [phone] Fax # [phone] Cell # [phone] [email or web address]            

## 2022-10-08 NOTE — Patient Outreach (Signed)
  Care Coordination   Initial Visit Note   10/08/2022  Name: [name] MRN: [id number] DOB: 1931/02/04  [name] is a 89 y.o. year old female who sees Hague, Rosalyn Charters, MD for primary care. I spoke with [name] by phone today.  What matters to the patients health and wellness today?  No interventions identified.  Patient denies need for social work involvement at this time.  SDOH assessments and interventions completed:  Yes.  SDOH Interventions Today    Flowsheet Row Most Recent Value  SDOH Interventions   Food Insecurity Interventions Intervention Not Indicated  Housing Interventions Intervention Not Indicated  Transportation Interventions Intervention Not Indicated, Patient Resources (Friends/Family), Payor Benefit  Utilities Interventions Intervention Not Indicated  Alcohol Usage Interventions Intervention Not Indicated (Score <7)  Financial Strain Interventions Intervention Not Indicated  Physical Activity Interventions Patient Refused  Stress Interventions Intervention Not Indicated  Social Connections Interventions Intervention Not Indicated     Care Coordination Interventions:  No, not indicated.   Follow up plan: No further intervention required.   Encounter Outcome:  Pt. Visit Completed. {THN Tip this will not be part of the note when signed-REQUIRED REPORT FIELD DO NOT DELETE (Optional):[id number]  [name], BSW, MSW, [employer]  Licensed Clinical Social Worker  [place]  Mailing [place] [address] [place], [place] [address] Physical Address-[address], [place], [place] [address] Toll Free Main # [phone] Fax # [phone] Cell # [phone] [name]

## 2022-10-09 DIAGNOSIS — E1151 Type 2 diabetes mellitus with diabetic peripheral angiopathy without gangrene: Secondary | ICD-10-CM | POA: Diagnosis not present

## 2022-10-09 DIAGNOSIS — I214 Non-ST elevation (NSTEMI) myocardial infarction: Secondary | ICD-10-CM | POA: Diagnosis not present

## 2022-10-09 DIAGNOSIS — I872 Venous insufficiency (chronic) (peripheral): Secondary | ICD-10-CM | POA: Diagnosis not present

## 2022-10-09 DIAGNOSIS — Z4800 Encounter for change or removal of nonsurgical wound dressing: Secondary | ICD-10-CM | POA: Diagnosis not present

## 2022-10-09 DIAGNOSIS — L97811 Non-pressure chronic ulcer of other part of right lower leg limited to breakdown of skin: Secondary | ICD-10-CM | POA: Diagnosis not present

## 2022-10-09 DIAGNOSIS — I5032 Chronic diastolic (congestive) heart failure: Secondary | ICD-10-CM | POA: Diagnosis not present

## 2022-10-14 DIAGNOSIS — L97811 Non-pressure chronic ulcer of other part of right lower leg limited to breakdown of skin: Secondary | ICD-10-CM | POA: Diagnosis not present

## 2022-10-14 DIAGNOSIS — Z4800 Encounter for change or removal of nonsurgical wound dressing: Secondary | ICD-10-CM | POA: Diagnosis not present

## 2022-10-14 DIAGNOSIS — I872 Venous insufficiency (chronic) (peripheral): Secondary | ICD-10-CM | POA: Diagnosis not present

## 2022-10-14 DIAGNOSIS — G8929 Other chronic pain: Secondary | ICD-10-CM | POA: Diagnosis not present

## 2022-10-14 DIAGNOSIS — E1140 Type 2 diabetes mellitus with diabetic neuropathy, unspecified: Secondary | ICD-10-CM | POA: Diagnosis not present

## 2022-10-14 DIAGNOSIS — I5032 Chronic diastolic (congestive) heart failure: Secondary | ICD-10-CM | POA: Diagnosis not present

## 2022-10-14 DIAGNOSIS — N183 Chronic kidney disease, stage 3 unspecified: Secondary | ICD-10-CM | POA: Diagnosis not present

## 2022-10-14 DIAGNOSIS — I10 Essential (primary) hypertension: Secondary | ICD-10-CM | POA: Diagnosis not present

## 2022-10-14 DIAGNOSIS — I214 Non-ST elevation (NSTEMI) myocardial infarction: Secondary | ICD-10-CM | POA: Diagnosis not present

## 2022-10-14 DIAGNOSIS — E782 Mixed hyperlipidemia: Secondary | ICD-10-CM | POA: Diagnosis not present

## 2022-10-14 DIAGNOSIS — E1151 Type 2 diabetes mellitus with diabetic peripheral angiopathy without gangrene: Secondary | ICD-10-CM | POA: Diagnosis not present

## 2022-10-14 DIAGNOSIS — G4700 Insomnia, unspecified: Secondary | ICD-10-CM | POA: Diagnosis not present

## 2022-10-14 DIAGNOSIS — E119 Type 2 diabetes mellitus without complications: Secondary | ICD-10-CM | POA: Diagnosis not present

## 2022-10-15 DIAGNOSIS — L97811 Non-pressure chronic ulcer of other part of right lower leg limited to breakdown of skin: Secondary | ICD-10-CM | POA: Diagnosis not present

## 2022-10-15 DIAGNOSIS — I5032 Chronic diastolic (congestive) heart failure: Secondary | ICD-10-CM | POA: Diagnosis not present

## 2022-10-15 DIAGNOSIS — E1151 Type 2 diabetes mellitus with diabetic peripheral angiopathy without gangrene: Secondary | ICD-10-CM | POA: Diagnosis not present

## 2022-10-15 DIAGNOSIS — I214 Non-ST elevation (NSTEMI) myocardial infarction: Secondary | ICD-10-CM | POA: Diagnosis not present

## 2022-10-15 DIAGNOSIS — I872 Venous insufficiency (chronic) (peripheral): Secondary | ICD-10-CM | POA: Diagnosis not present

## 2022-10-15 DIAGNOSIS — Z4800 Encounter for change or removal of nonsurgical wound dressing: Secondary | ICD-10-CM | POA: Diagnosis not present

## 2022-10-16 DIAGNOSIS — L97911 Non-pressure chronic ulcer of unspecified part of right lower leg limited to breakdown of skin: Secondary | ICD-10-CM | POA: Diagnosis not present

## 2022-10-17 DIAGNOSIS — E1151 Type 2 diabetes mellitus with diabetic peripheral angiopathy without gangrene: Secondary | ICD-10-CM | POA: Diagnosis not present

## 2022-10-17 DIAGNOSIS — L97811 Non-pressure chronic ulcer of other part of right lower leg limited to breakdown of skin: Secondary | ICD-10-CM | POA: Diagnosis not present

## 2022-10-17 DIAGNOSIS — Z4800 Encounter for change or removal of nonsurgical wound dressing: Secondary | ICD-10-CM | POA: Diagnosis not present

## 2022-10-17 DIAGNOSIS — I872 Venous insufficiency (chronic) (peripheral): Secondary | ICD-10-CM | POA: Diagnosis not present

## 2022-10-17 DIAGNOSIS — I214 Non-ST elevation (NSTEMI) myocardial infarction: Secondary | ICD-10-CM | POA: Diagnosis not present

## 2022-10-17 DIAGNOSIS — I5032 Chronic diastolic (congestive) heart failure: Secondary | ICD-10-CM | POA: Diagnosis not present

## 2022-10-20 DIAGNOSIS — I214 Non-ST elevation (NSTEMI) myocardial infarction: Secondary | ICD-10-CM | POA: Diagnosis not present

## 2022-10-20 DIAGNOSIS — I5032 Chronic diastolic (congestive) heart failure: Secondary | ICD-10-CM | POA: Diagnosis not present

## 2022-10-20 DIAGNOSIS — I872 Venous insufficiency (chronic) (peripheral): Secondary | ICD-10-CM | POA: Diagnosis not present

## 2022-10-20 DIAGNOSIS — Z4800 Encounter for change or removal of nonsurgical wound dressing: Secondary | ICD-10-CM | POA: Diagnosis not present

## 2022-10-20 DIAGNOSIS — E1151 Type 2 diabetes mellitus with diabetic peripheral angiopathy without gangrene: Secondary | ICD-10-CM | POA: Diagnosis not present

## 2022-10-20 DIAGNOSIS — L97811 Non-pressure chronic ulcer of other part of right lower leg limited to breakdown of skin: Secondary | ICD-10-CM | POA: Diagnosis not present

## 2022-10-21 DIAGNOSIS — I214 Non-ST elevation (NSTEMI) myocardial infarction: Secondary | ICD-10-CM | POA: Diagnosis not present

## 2022-10-21 DIAGNOSIS — I872 Venous insufficiency (chronic) (peripheral): Secondary | ICD-10-CM | POA: Diagnosis not present

## 2022-10-21 DIAGNOSIS — I5032 Chronic diastolic (congestive) heart failure: Secondary | ICD-10-CM | POA: Diagnosis not present

## 2022-10-21 DIAGNOSIS — Z4800 Encounter for change or removal of nonsurgical wound dressing: Secondary | ICD-10-CM | POA: Diagnosis not present

## 2022-10-21 DIAGNOSIS — E1151 Type 2 diabetes mellitus with diabetic peripheral angiopathy without gangrene: Secondary | ICD-10-CM | POA: Diagnosis not present

## 2022-10-21 DIAGNOSIS — L97811 Non-pressure chronic ulcer of other part of right lower leg limited to breakdown of skin: Secondary | ICD-10-CM | POA: Diagnosis not present

## 2022-10-23 DIAGNOSIS — L97811 Non-pressure chronic ulcer of other part of right lower leg limited to breakdown of skin: Secondary | ICD-10-CM | POA: Diagnosis not present

## 2022-10-23 DIAGNOSIS — I214 Non-ST elevation (NSTEMI) myocardial infarction: Secondary | ICD-10-CM | POA: Diagnosis not present

## 2022-10-23 DIAGNOSIS — I5032 Chronic diastolic (congestive) heart failure: Secondary | ICD-10-CM | POA: Diagnosis not present

## 2022-10-23 DIAGNOSIS — I872 Venous insufficiency (chronic) (peripheral): Secondary | ICD-10-CM | POA: Diagnosis not present

## 2022-10-23 DIAGNOSIS — Z4800 Encounter for change or removal of nonsurgical wound dressing: Secondary | ICD-10-CM | POA: Diagnosis not present

## 2022-10-23 DIAGNOSIS — E1151 Type 2 diabetes mellitus with diabetic peripheral angiopathy without gangrene: Secondary | ICD-10-CM | POA: Diagnosis not present

## 2022-10-24 DIAGNOSIS — L97811 Non-pressure chronic ulcer of other part of right lower leg limited to breakdown of skin: Secondary | ICD-10-CM | POA: Diagnosis not present

## 2022-10-24 DIAGNOSIS — E1151 Type 2 diabetes mellitus with diabetic peripheral angiopathy without gangrene: Secondary | ICD-10-CM | POA: Diagnosis not present

## 2022-10-24 DIAGNOSIS — I5032 Chronic diastolic (congestive) heart failure: Secondary | ICD-10-CM | POA: Diagnosis not present

## 2022-10-24 DIAGNOSIS — Z4800 Encounter for change or removal of nonsurgical wound dressing: Secondary | ICD-10-CM | POA: Diagnosis not present

## 2022-10-24 DIAGNOSIS — I872 Venous insufficiency (chronic) (peripheral): Secondary | ICD-10-CM | POA: Diagnosis not present

## 2022-10-24 DIAGNOSIS — I214 Non-ST elevation (NSTEMI) myocardial infarction: Secondary | ICD-10-CM | POA: Diagnosis not present

## 2022-10-27 DIAGNOSIS — I872 Venous insufficiency (chronic) (peripheral): Secondary | ICD-10-CM | POA: Diagnosis not present

## 2022-10-27 DIAGNOSIS — I5032 Chronic diastolic (congestive) heart failure: Secondary | ICD-10-CM | POA: Diagnosis not present

## 2022-10-27 DIAGNOSIS — E559 Vitamin D deficiency, unspecified: Secondary | ICD-10-CM | POA: Diagnosis not present

## 2022-10-27 DIAGNOSIS — I129 Hypertensive chronic kidney disease with stage 1 through stage 4 chronic kidney disease, or unspecified chronic kidney disease: Secondary | ICD-10-CM | POA: Diagnosis not present

## 2022-10-27 DIAGNOSIS — L97811 Non-pressure chronic ulcer of other part of right lower leg limited to breakdown of skin: Secondary | ICD-10-CM | POA: Diagnosis not present

## 2022-10-27 DIAGNOSIS — E1151 Type 2 diabetes mellitus with diabetic peripheral angiopathy without gangrene: Secondary | ICD-10-CM | POA: Diagnosis not present

## 2022-10-27 DIAGNOSIS — G8929 Other chronic pain: Secondary | ICD-10-CM | POA: Diagnosis not present

## 2022-10-27 DIAGNOSIS — I10 Essential (primary) hypertension: Secondary | ICD-10-CM | POA: Diagnosis not present

## 2022-10-27 DIAGNOSIS — I509 Heart failure, unspecified: Secondary | ICD-10-CM | POA: Diagnosis not present

## 2022-10-27 DIAGNOSIS — E038 Other specified hypothyroidism: Secondary | ICD-10-CM | POA: Diagnosis not present

## 2022-10-27 DIAGNOSIS — M1990 Unspecified osteoarthritis, unspecified site: Secondary | ICD-10-CM | POA: Diagnosis not present

## 2022-10-27 DIAGNOSIS — E119 Type 2 diabetes mellitus without complications: Secondary | ICD-10-CM | POA: Diagnosis not present

## 2022-10-27 DIAGNOSIS — E7849 Other hyperlipidemia: Secondary | ICD-10-CM | POA: Diagnosis not present

## 2022-10-27 DIAGNOSIS — I214 Non-ST elevation (NSTEMI) myocardial infarction: Secondary | ICD-10-CM | POA: Diagnosis not present

## 2022-10-27 DIAGNOSIS — Z4800 Encounter for change or removal of nonsurgical wound dressing: Secondary | ICD-10-CM | POA: Diagnosis not present

## 2022-10-27 DIAGNOSIS — D518 Other vitamin B12 deficiency anemias: Secondary | ICD-10-CM | POA: Diagnosis not present

## 2022-10-29 DIAGNOSIS — Z4800 Encounter for change or removal of nonsurgical wound dressing: Secondary | ICD-10-CM | POA: Diagnosis not present

## 2022-10-29 DIAGNOSIS — E1151 Type 2 diabetes mellitus with diabetic peripheral angiopathy without gangrene: Secondary | ICD-10-CM | POA: Diagnosis not present

## 2022-10-29 DIAGNOSIS — I214 Non-ST elevation (NSTEMI) myocardial infarction: Secondary | ICD-10-CM | POA: Diagnosis not present

## 2022-10-29 DIAGNOSIS — I872 Venous insufficiency (chronic) (peripheral): Secondary | ICD-10-CM | POA: Diagnosis not present

## 2022-10-29 DIAGNOSIS — I5032 Chronic diastolic (congestive) heart failure: Secondary | ICD-10-CM | POA: Diagnosis not present

## 2022-10-29 DIAGNOSIS — L97811 Non-pressure chronic ulcer of other part of right lower leg limited to breakdown of skin: Secondary | ICD-10-CM | POA: Diagnosis not present

## 2022-10-31 DIAGNOSIS — I214 Non-ST elevation (NSTEMI) myocardial infarction: Secondary | ICD-10-CM | POA: Diagnosis not present

## 2022-10-31 DIAGNOSIS — Z4800 Encounter for change or removal of nonsurgical wound dressing: Secondary | ICD-10-CM | POA: Diagnosis not present

## 2022-10-31 DIAGNOSIS — L97811 Non-pressure chronic ulcer of other part of right lower leg limited to breakdown of skin: Secondary | ICD-10-CM | POA: Diagnosis not present

## 2022-10-31 DIAGNOSIS — E1151 Type 2 diabetes mellitus with diabetic peripheral angiopathy without gangrene: Secondary | ICD-10-CM | POA: Diagnosis not present

## 2022-10-31 DIAGNOSIS — I872 Venous insufficiency (chronic) (peripheral): Secondary | ICD-10-CM | POA: Diagnosis not present

## 2022-10-31 DIAGNOSIS — I5032 Chronic diastolic (congestive) heart failure: Secondary | ICD-10-CM | POA: Diagnosis not present

## 2022-11-03 DIAGNOSIS — I872 Venous insufficiency (chronic) (peripheral): Secondary | ICD-10-CM | POA: Diagnosis not present

## 2022-11-03 DIAGNOSIS — I214 Non-ST elevation (NSTEMI) myocardial infarction: Secondary | ICD-10-CM | POA: Diagnosis not present

## 2022-11-03 DIAGNOSIS — Z4800 Encounter for change or removal of nonsurgical wound dressing: Secondary | ICD-10-CM | POA: Diagnosis not present

## 2022-11-03 DIAGNOSIS — I5032 Chronic diastolic (congestive) heart failure: Secondary | ICD-10-CM | POA: Diagnosis not present

## 2022-11-03 DIAGNOSIS — E1151 Type 2 diabetes mellitus with diabetic peripheral angiopathy without gangrene: Secondary | ICD-10-CM | POA: Diagnosis not present

## 2022-11-03 DIAGNOSIS — L97811 Non-pressure chronic ulcer of other part of right lower leg limited to breakdown of skin: Secondary | ICD-10-CM | POA: Diagnosis not present

## 2022-11-04 DIAGNOSIS — L97811 Non-pressure chronic ulcer of other part of right lower leg limited to breakdown of skin: Secondary | ICD-10-CM | POA: Diagnosis not present

## 2022-11-04 DIAGNOSIS — I5032 Chronic diastolic (congestive) heart failure: Secondary | ICD-10-CM | POA: Diagnosis not present

## 2022-11-04 DIAGNOSIS — I214 Non-ST elevation (NSTEMI) myocardial infarction: Secondary | ICD-10-CM | POA: Diagnosis not present

## 2022-11-04 DIAGNOSIS — E1151 Type 2 diabetes mellitus with diabetic peripheral angiopathy without gangrene: Secondary | ICD-10-CM | POA: Diagnosis not present

## 2022-11-04 DIAGNOSIS — I872 Venous insufficiency (chronic) (peripheral): Secondary | ICD-10-CM | POA: Diagnosis not present

## 2022-11-04 DIAGNOSIS — Z4800 Encounter for change or removal of nonsurgical wound dressing: Secondary | ICD-10-CM | POA: Diagnosis not present

## 2022-11-10 DIAGNOSIS — E1165 Type 2 diabetes mellitus with hyperglycemia: Secondary | ICD-10-CM | POA: Diagnosis not present

## 2022-11-11 DIAGNOSIS — I5032 Chronic diastolic (congestive) heart failure: Secondary | ICD-10-CM | POA: Diagnosis not present

## 2022-11-11 DIAGNOSIS — Z4800 Encounter for change or removal of nonsurgical wound dressing: Secondary | ICD-10-CM | POA: Diagnosis not present

## 2022-11-11 DIAGNOSIS — E559 Vitamin D deficiency, unspecified: Secondary | ICD-10-CM | POA: Diagnosis not present

## 2022-11-11 DIAGNOSIS — E1151 Type 2 diabetes mellitus with diabetic peripheral angiopathy without gangrene: Secondary | ICD-10-CM | POA: Diagnosis not present

## 2022-11-11 DIAGNOSIS — G4700 Insomnia, unspecified: Secondary | ICD-10-CM | POA: Diagnosis not present

## 2022-11-11 DIAGNOSIS — M1990 Unspecified osteoarthritis, unspecified site: Secondary | ICD-10-CM | POA: Diagnosis not present

## 2022-11-11 DIAGNOSIS — L97811 Non-pressure chronic ulcer of other part of right lower leg limited to breakdown of skin: Secondary | ICD-10-CM | POA: Diagnosis not present

## 2022-11-11 DIAGNOSIS — N1831 Chronic kidney disease, stage 3a: Secondary | ICD-10-CM | POA: Diagnosis not present

## 2022-11-11 DIAGNOSIS — G8929 Other chronic pain: Secondary | ICD-10-CM | POA: Diagnosis not present

## 2022-11-11 DIAGNOSIS — I872 Venous insufficiency (chronic) (peripheral): Secondary | ICD-10-CM | POA: Diagnosis not present

## 2022-11-11 DIAGNOSIS — E785 Hyperlipidemia, unspecified: Secondary | ICD-10-CM | POA: Diagnosis not present

## 2022-11-11 DIAGNOSIS — E119 Type 2 diabetes mellitus without complications: Secondary | ICD-10-CM | POA: Diagnosis not present

## 2022-11-11 DIAGNOSIS — N183 Chronic kidney disease, stage 3 unspecified: Secondary | ICD-10-CM | POA: Diagnosis not present

## 2022-11-11 DIAGNOSIS — E1140 Type 2 diabetes mellitus with diabetic neuropathy, unspecified: Secondary | ICD-10-CM | POA: Diagnosis not present

## 2022-11-11 DIAGNOSIS — E538 Deficiency of other specified B group vitamins: Secondary | ICD-10-CM | POA: Diagnosis not present

## 2022-11-11 DIAGNOSIS — I214 Non-ST elevation (NSTEMI) myocardial infarction: Secondary | ICD-10-CM | POA: Diagnosis not present

## 2022-11-12 DIAGNOSIS — E1151 Type 2 diabetes mellitus with diabetic peripheral angiopathy without gangrene: Secondary | ICD-10-CM | POA: Diagnosis not present

## 2022-11-12 DIAGNOSIS — I214 Non-ST elevation (NSTEMI) myocardial infarction: Secondary | ICD-10-CM | POA: Diagnosis not present

## 2022-11-12 DIAGNOSIS — I872 Venous insufficiency (chronic) (peripheral): Secondary | ICD-10-CM | POA: Diagnosis not present

## 2022-11-12 DIAGNOSIS — Z4800 Encounter for change or removal of nonsurgical wound dressing: Secondary | ICD-10-CM | POA: Diagnosis not present

## 2022-11-12 DIAGNOSIS — I5032 Chronic diastolic (congestive) heart failure: Secondary | ICD-10-CM | POA: Diagnosis not present

## 2022-11-12 DIAGNOSIS — L97811 Non-pressure chronic ulcer of other part of right lower leg limited to breakdown of skin: Secondary | ICD-10-CM | POA: Diagnosis not present

## 2022-11-13 DIAGNOSIS — S81801A Unspecified open wound, right lower leg, initial encounter: Secondary | ICD-10-CM | POA: Diagnosis not present

## 2022-11-17 DIAGNOSIS — Z4800 Encounter for change or removal of nonsurgical wound dressing: Secondary | ICD-10-CM | POA: Diagnosis not present

## 2022-11-17 DIAGNOSIS — L97811 Non-pressure chronic ulcer of other part of right lower leg limited to breakdown of skin: Secondary | ICD-10-CM | POA: Diagnosis not present

## 2022-11-17 DIAGNOSIS — I5032 Chronic diastolic (congestive) heart failure: Secondary | ICD-10-CM | POA: Diagnosis not present

## 2022-11-17 DIAGNOSIS — I214 Non-ST elevation (NSTEMI) myocardial infarction: Secondary | ICD-10-CM | POA: Diagnosis not present

## 2022-11-17 DIAGNOSIS — E1151 Type 2 diabetes mellitus with diabetic peripheral angiopathy without gangrene: Secondary | ICD-10-CM | POA: Diagnosis not present

## 2022-11-17 DIAGNOSIS — I872 Venous insufficiency (chronic) (peripheral): Secondary | ICD-10-CM | POA: Diagnosis not present

## 2022-11-19 DIAGNOSIS — Z87828 Personal history of other (healed) physical injury and trauma: Secondary | ICD-10-CM | POA: Diagnosis not present

## 2022-11-19 DIAGNOSIS — E785 Hyperlipidemia, unspecified: Secondary | ICD-10-CM | POA: Diagnosis not present

## 2022-11-19 DIAGNOSIS — E119 Type 2 diabetes mellitus without complications: Secondary | ICD-10-CM | POA: Diagnosis not present

## 2022-11-19 DIAGNOSIS — L97911 Non-pressure chronic ulcer of unspecified part of right lower leg limited to breakdown of skin: Secondary | ICD-10-CM | POA: Diagnosis not present

## 2022-11-19 DIAGNOSIS — I110 Hypertensive heart disease with heart failure: Secondary | ICD-10-CM | POA: Diagnosis not present

## 2022-11-19 DIAGNOSIS — I890 Lymphedema, not elsewhere classified: Secondary | ICD-10-CM | POA: Diagnosis not present

## 2022-11-19 DIAGNOSIS — Z794 Long term (current) use of insulin: Secondary | ICD-10-CM | POA: Diagnosis not present

## 2022-11-19 DIAGNOSIS — I5030 Unspecified diastolic (congestive) heart failure: Secondary | ICD-10-CM | POA: Diagnosis not present

## 2022-11-19 DIAGNOSIS — Z79899 Other long term (current) drug therapy: Secondary | ICD-10-CM | POA: Diagnosis not present

## 2022-11-21 DIAGNOSIS — R011 Cardiac murmur, unspecified: Secondary | ICD-10-CM | POA: Diagnosis not present

## 2022-11-23 DIAGNOSIS — M1990 Unspecified osteoarthritis, unspecified site: Secondary | ICD-10-CM | POA: Diagnosis not present

## 2022-11-23 DIAGNOSIS — E119 Type 2 diabetes mellitus without complications: Secondary | ICD-10-CM | POA: Diagnosis not present

## 2022-11-23 DIAGNOSIS — I10 Essential (primary) hypertension: Secondary | ICD-10-CM | POA: Diagnosis not present

## 2022-11-23 DIAGNOSIS — E038 Other specified hypothyroidism: Secondary | ICD-10-CM | POA: Diagnosis not present

## 2022-11-23 DIAGNOSIS — E7849 Other hyperlipidemia: Secondary | ICD-10-CM | POA: Diagnosis not present

## 2022-11-23 DIAGNOSIS — I5032 Chronic diastolic (congestive) heart failure: Secondary | ICD-10-CM | POA: Diagnosis not present

## 2022-11-23 DIAGNOSIS — E559 Vitamin D deficiency, unspecified: Secondary | ICD-10-CM | POA: Diagnosis not present

## 2022-11-23 DIAGNOSIS — G8929 Other chronic pain: Secondary | ICD-10-CM | POA: Diagnosis not present

## 2022-11-23 DIAGNOSIS — I129 Hypertensive chronic kidney disease with stage 1 through stage 4 chronic kidney disease, or unspecified chronic kidney disease: Secondary | ICD-10-CM | POA: Diagnosis not present

## 2022-11-23 DIAGNOSIS — D518 Other vitamin B12 deficiency anemias: Secondary | ICD-10-CM | POA: Diagnosis not present

## 2022-11-25 DIAGNOSIS — Z4800 Encounter for change or removal of nonsurgical wound dressing: Secondary | ICD-10-CM | POA: Diagnosis not present

## 2022-11-25 DIAGNOSIS — L97811 Non-pressure chronic ulcer of other part of right lower leg limited to breakdown of skin: Secondary | ICD-10-CM | POA: Diagnosis not present

## 2022-11-25 DIAGNOSIS — I872 Venous insufficiency (chronic) (peripheral): Secondary | ICD-10-CM | POA: Diagnosis not present

## 2022-11-25 DIAGNOSIS — E1151 Type 2 diabetes mellitus with diabetic peripheral angiopathy without gangrene: Secondary | ICD-10-CM | POA: Diagnosis not present

## 2022-11-25 DIAGNOSIS — I214 Non-ST elevation (NSTEMI) myocardial infarction: Secondary | ICD-10-CM | POA: Diagnosis not present

## 2022-11-25 DIAGNOSIS — I5032 Chronic diastolic (congestive) heart failure: Secondary | ICD-10-CM | POA: Diagnosis not present

## 2022-12-03 DIAGNOSIS — E1151 Type 2 diabetes mellitus with diabetic peripheral angiopathy without gangrene: Secondary | ICD-10-CM | POA: Diagnosis not present

## 2022-12-03 DIAGNOSIS — Z4800 Encounter for change or removal of nonsurgical wound dressing: Secondary | ICD-10-CM | POA: Diagnosis not present

## 2022-12-03 DIAGNOSIS — I110 Hypertensive heart disease with heart failure: Secondary | ICD-10-CM | POA: Diagnosis not present

## 2022-12-03 DIAGNOSIS — L97811 Non-pressure chronic ulcer of other part of right lower leg limited to breakdown of skin: Secondary | ICD-10-CM | POA: Diagnosis not present

## 2022-12-03 DIAGNOSIS — I872 Venous insufficiency (chronic) (peripheral): Secondary | ICD-10-CM | POA: Diagnosis not present

## 2022-12-03 DIAGNOSIS — I5032 Chronic diastolic (congestive) heart failure: Secondary | ICD-10-CM | POA: Diagnosis not present

## 2022-12-04 DIAGNOSIS — I2510 Atherosclerotic heart disease of native coronary artery without angina pectoris: Secondary | ICD-10-CM | POA: Diagnosis not present

## 2022-12-04 DIAGNOSIS — I10 Essential (primary) hypertension: Secondary | ICD-10-CM | POA: Diagnosis not present

## 2022-12-04 DIAGNOSIS — I5032 Chronic diastolic (congestive) heart failure: Secondary | ICD-10-CM | POA: Diagnosis not present

## 2022-12-04 DIAGNOSIS — E785 Hyperlipidemia, unspecified: Secondary | ICD-10-CM | POA: Diagnosis not present

## 2022-12-04 DIAGNOSIS — I5020 Unspecified systolic (congestive) heart failure: Secondary | ICD-10-CM | POA: Diagnosis not present

## 2022-12-04 DIAGNOSIS — I484 Atypical atrial flutter: Secondary | ICD-10-CM | POA: Diagnosis not present

## 2022-12-05 DIAGNOSIS — R079 Chest pain, unspecified: Secondary | ICD-10-CM | POA: Diagnosis not present

## 2022-12-09 DIAGNOSIS — E119 Type 2 diabetes mellitus without complications: Secondary | ICD-10-CM | POA: Diagnosis not present

## 2022-12-09 DIAGNOSIS — N1831 Chronic kidney disease, stage 3a: Secondary | ICD-10-CM | POA: Diagnosis not present

## 2022-12-09 DIAGNOSIS — E559 Vitamin D deficiency, unspecified: Secondary | ICD-10-CM | POA: Diagnosis not present

## 2022-12-09 DIAGNOSIS — M1990 Unspecified osteoarthritis, unspecified site: Secondary | ICD-10-CM | POA: Diagnosis not present

## 2022-12-09 DIAGNOSIS — N183 Chronic kidney disease, stage 3 unspecified: Secondary | ICD-10-CM | POA: Diagnosis not present

## 2022-12-09 DIAGNOSIS — E538 Deficiency of other specified B group vitamins: Secondary | ICD-10-CM | POA: Diagnosis not present

## 2022-12-09 DIAGNOSIS — E1140 Type 2 diabetes mellitus with diabetic neuropathy, unspecified: Secondary | ICD-10-CM | POA: Diagnosis not present

## 2022-12-09 DIAGNOSIS — S81801S Unspecified open wound, right lower leg, sequela: Secondary | ICD-10-CM | POA: Diagnosis not present

## 2022-12-09 DIAGNOSIS — L97911 Non-pressure chronic ulcer of unspecified part of right lower leg limited to breakdown of skin: Secondary | ICD-10-CM | POA: Diagnosis not present

## 2022-12-09 DIAGNOSIS — I5032 Chronic diastolic (congestive) heart failure: Secondary | ICD-10-CM | POA: Diagnosis not present

## 2022-12-09 DIAGNOSIS — G4700 Insomnia, unspecified: Secondary | ICD-10-CM | POA: Diagnosis not present

## 2022-12-09 DIAGNOSIS — I10 Essential (primary) hypertension: Secondary | ICD-10-CM | POA: Diagnosis not present

## 2022-12-10 DIAGNOSIS — E1165 Type 2 diabetes mellitus with hyperglycemia: Secondary | ICD-10-CM | POA: Diagnosis not present

## 2022-12-11 DIAGNOSIS — I872 Venous insufficiency (chronic) (peripheral): Secondary | ICD-10-CM | POA: Diagnosis not present

## 2022-12-11 DIAGNOSIS — I110 Hypertensive heart disease with heart failure: Secondary | ICD-10-CM | POA: Diagnosis not present

## 2022-12-11 DIAGNOSIS — L97811 Non-pressure chronic ulcer of other part of right lower leg limited to breakdown of skin: Secondary | ICD-10-CM | POA: Diagnosis not present

## 2022-12-11 DIAGNOSIS — E1151 Type 2 diabetes mellitus with diabetic peripheral angiopathy without gangrene: Secondary | ICD-10-CM | POA: Diagnosis not present

## 2022-12-11 DIAGNOSIS — I5032 Chronic diastolic (congestive) heart failure: Secondary | ICD-10-CM | POA: Diagnosis not present

## 2022-12-11 DIAGNOSIS — Z4800 Encounter for change or removal of nonsurgical wound dressing: Secondary | ICD-10-CM | POA: Diagnosis not present

## 2022-12-17 DIAGNOSIS — I872 Venous insufficiency (chronic) (peripheral): Secondary | ICD-10-CM | POA: Diagnosis not present

## 2022-12-17 DIAGNOSIS — I5032 Chronic diastolic (congestive) heart failure: Secondary | ICD-10-CM | POA: Diagnosis not present

## 2022-12-17 DIAGNOSIS — L97811 Non-pressure chronic ulcer of other part of right lower leg limited to breakdown of skin: Secondary | ICD-10-CM | POA: Diagnosis not present

## 2022-12-17 DIAGNOSIS — I110 Hypertensive heart disease with heart failure: Secondary | ICD-10-CM | POA: Diagnosis not present

## 2022-12-17 DIAGNOSIS — Z4800 Encounter for change or removal of nonsurgical wound dressing: Secondary | ICD-10-CM | POA: Diagnosis not present

## 2022-12-17 DIAGNOSIS — E1151 Type 2 diabetes mellitus with diabetic peripheral angiopathy without gangrene: Secondary | ICD-10-CM | POA: Diagnosis not present

## 2022-12-25 DIAGNOSIS — L97811 Non-pressure chronic ulcer of other part of right lower leg limited to breakdown of skin: Secondary | ICD-10-CM | POA: Diagnosis not present

## 2022-12-25 DIAGNOSIS — Z4800 Encounter for change or removal of nonsurgical wound dressing: Secondary | ICD-10-CM | POA: Diagnosis not present

## 2022-12-25 DIAGNOSIS — I5032 Chronic diastolic (congestive) heart failure: Secondary | ICD-10-CM | POA: Diagnosis not present

## 2022-12-25 DIAGNOSIS — I872 Venous insufficiency (chronic) (peripheral): Secondary | ICD-10-CM | POA: Diagnosis not present

## 2022-12-25 DIAGNOSIS — I110 Hypertensive heart disease with heart failure: Secondary | ICD-10-CM | POA: Diagnosis not present

## 2022-12-25 DIAGNOSIS — E1151 Type 2 diabetes mellitus with diabetic peripheral angiopathy without gangrene: Secondary | ICD-10-CM | POA: Diagnosis not present

## 2022-12-26 DIAGNOSIS — E119 Type 2 diabetes mellitus without complications: Secondary | ICD-10-CM | POA: Diagnosis not present

## 2022-12-26 DIAGNOSIS — E7849 Other hyperlipidemia: Secondary | ICD-10-CM | POA: Diagnosis not present

## 2022-12-26 DIAGNOSIS — M1990 Unspecified osteoarthritis, unspecified site: Secondary | ICD-10-CM | POA: Diagnosis not present

## 2022-12-26 DIAGNOSIS — I10 Essential (primary) hypertension: Secondary | ICD-10-CM | POA: Diagnosis not present

## 2022-12-26 DIAGNOSIS — I509 Heart failure, unspecified: Secondary | ICD-10-CM | POA: Diagnosis not present

## 2022-12-26 DIAGNOSIS — I110 Hypertensive heart disease with heart failure: Secondary | ICD-10-CM | POA: Diagnosis not present

## 2022-12-26 DIAGNOSIS — D518 Other vitamin B12 deficiency anemias: Secondary | ICD-10-CM | POA: Diagnosis not present

## 2022-12-26 DIAGNOSIS — E038 Other specified hypothyroidism: Secondary | ICD-10-CM | POA: Diagnosis not present

## 2022-12-26 DIAGNOSIS — E559 Vitamin D deficiency, unspecified: Secondary | ICD-10-CM | POA: Diagnosis not present

## 2022-12-26 DIAGNOSIS — G8929 Other chronic pain: Secondary | ICD-10-CM | POA: Diagnosis not present

## 2022-12-30 DIAGNOSIS — L97811 Non-pressure chronic ulcer of other part of right lower leg limited to breakdown of skin: Secondary | ICD-10-CM | POA: Diagnosis not present

## 2022-12-30 DIAGNOSIS — Z4800 Encounter for change or removal of nonsurgical wound dressing: Secondary | ICD-10-CM | POA: Diagnosis not present

## 2022-12-30 DIAGNOSIS — E1151 Type 2 diabetes mellitus with diabetic peripheral angiopathy without gangrene: Secondary | ICD-10-CM | POA: Diagnosis not present

## 2022-12-30 DIAGNOSIS — I110 Hypertensive heart disease with heart failure: Secondary | ICD-10-CM | POA: Diagnosis not present

## 2022-12-30 DIAGNOSIS — I5032 Chronic diastolic (congestive) heart failure: Secondary | ICD-10-CM | POA: Diagnosis not present

## 2022-12-30 DIAGNOSIS — I872 Venous insufficiency (chronic) (peripheral): Secondary | ICD-10-CM | POA: Diagnosis not present

## 2023-01-02 DIAGNOSIS — L97819 Non-pressure chronic ulcer of other part of right lower leg with unspecified severity: Secondary | ICD-10-CM | POA: Diagnosis not present

## 2023-01-05 DIAGNOSIS — B351 Tinea unguium: Secondary | ICD-10-CM | POA: Diagnosis not present

## 2023-01-05 DIAGNOSIS — M79672 Pain in left foot: Secondary | ICD-10-CM | POA: Diagnosis not present

## 2023-01-05 DIAGNOSIS — M79671 Pain in right foot: Secondary | ICD-10-CM | POA: Diagnosis not present

## 2023-01-05 DIAGNOSIS — E1140 Type 2 diabetes mellitus with diabetic neuropathy, unspecified: Secondary | ICD-10-CM | POA: Diagnosis not present

## 2023-01-05 DIAGNOSIS — L600 Ingrowing nail: Secondary | ICD-10-CM | POA: Diagnosis not present

## 2023-01-06 DIAGNOSIS — M1990 Unspecified osteoarthritis, unspecified site: Secondary | ICD-10-CM | POA: Diagnosis not present

## 2023-01-06 DIAGNOSIS — G8929 Other chronic pain: Secondary | ICD-10-CM | POA: Diagnosis not present

## 2023-01-06 DIAGNOSIS — I10 Essential (primary) hypertension: Secondary | ICD-10-CM | POA: Diagnosis not present

## 2023-01-06 DIAGNOSIS — E559 Vitamin D deficiency, unspecified: Secondary | ICD-10-CM | POA: Diagnosis not present

## 2023-01-06 DIAGNOSIS — G4700 Insomnia, unspecified: Secondary | ICD-10-CM | POA: Diagnosis not present

## 2023-01-06 DIAGNOSIS — N1832 Chronic kidney disease, stage 3b: Secondary | ICD-10-CM | POA: Diagnosis not present

## 2023-01-06 DIAGNOSIS — E782 Mixed hyperlipidemia: Secondary | ICD-10-CM | POA: Diagnosis not present

## 2023-01-06 DIAGNOSIS — E1140 Type 2 diabetes mellitus with diabetic neuropathy, unspecified: Secondary | ICD-10-CM | POA: Diagnosis not present

## 2023-01-06 DIAGNOSIS — I5032 Chronic diastolic (congestive) heart failure: Secondary | ICD-10-CM | POA: Diagnosis not present

## 2023-01-06 DIAGNOSIS — E119 Type 2 diabetes mellitus without complications: Secondary | ICD-10-CM | POA: Diagnosis not present

## 2023-01-07 DIAGNOSIS — I872 Venous insufficiency (chronic) (peripheral): Secondary | ICD-10-CM | POA: Diagnosis not present

## 2023-01-07 DIAGNOSIS — L97811 Non-pressure chronic ulcer of other part of right lower leg limited to breakdown of skin: Secondary | ICD-10-CM | POA: Diagnosis not present

## 2023-01-07 DIAGNOSIS — Z7984 Long term (current) use of oral hypoglycemic drugs: Secondary | ICD-10-CM | POA: Diagnosis not present

## 2023-01-07 DIAGNOSIS — Z85038 Personal history of other malignant neoplasm of large intestine: Secondary | ICD-10-CM | POA: Diagnosis not present

## 2023-01-07 DIAGNOSIS — Z888 Allergy status to other drugs, medicaments and biological substances status: Secondary | ICD-10-CM | POA: Diagnosis not present

## 2023-01-07 DIAGNOSIS — Z7902 Long term (current) use of antithrombotics/antiplatelets: Secondary | ICD-10-CM | POA: Diagnosis not present

## 2023-01-07 DIAGNOSIS — E785 Hyperlipidemia, unspecified: Secondary | ICD-10-CM | POA: Diagnosis not present

## 2023-01-07 DIAGNOSIS — L400 Psoriasis vulgaris: Secondary | ICD-10-CM | POA: Diagnosis not present

## 2023-01-07 DIAGNOSIS — E1151 Type 2 diabetes mellitus with diabetic peripheral angiopathy without gangrene: Secondary | ICD-10-CM | POA: Diagnosis not present

## 2023-01-07 DIAGNOSIS — I890 Lymphedema, not elsewhere classified: Secondary | ICD-10-CM | POA: Diagnosis not present

## 2023-01-07 DIAGNOSIS — I110 Hypertensive heart disease with heart failure: Secondary | ICD-10-CM | POA: Diagnosis not present

## 2023-01-07 DIAGNOSIS — Z4800 Encounter for change or removal of nonsurgical wound dressing: Secondary | ICD-10-CM | POA: Diagnosis not present

## 2023-01-07 DIAGNOSIS — E559 Vitamin D deficiency, unspecified: Secondary | ICD-10-CM | POA: Diagnosis not present

## 2023-01-07 DIAGNOSIS — Z79899 Other long term (current) drug therapy: Secondary | ICD-10-CM | POA: Diagnosis not present

## 2023-01-07 DIAGNOSIS — I5032 Chronic diastolic (congestive) heart failure: Secondary | ICD-10-CM | POA: Diagnosis not present

## 2023-01-07 DIAGNOSIS — E119 Type 2 diabetes mellitus without complications: Secondary | ICD-10-CM | POA: Diagnosis not present

## 2023-01-07 DIAGNOSIS — S81801S Unspecified open wound, right lower leg, sequela: Secondary | ICD-10-CM | POA: Diagnosis not present

## 2023-01-07 DIAGNOSIS — Z09 Encounter for follow-up examination after completed treatment for conditions other than malignant neoplasm: Secondary | ICD-10-CM | POA: Diagnosis not present

## 2023-01-10 DIAGNOSIS — E1165 Type 2 diabetes mellitus with hyperglycemia: Secondary | ICD-10-CM | POA: Diagnosis not present

## 2023-01-13 DIAGNOSIS — I110 Hypertensive heart disease with heart failure: Secondary | ICD-10-CM | POA: Diagnosis not present

## 2023-01-13 DIAGNOSIS — Z4800 Encounter for change or removal of nonsurgical wound dressing: Secondary | ICD-10-CM | POA: Diagnosis not present

## 2023-01-13 DIAGNOSIS — I5032 Chronic diastolic (congestive) heart failure: Secondary | ICD-10-CM | POA: Diagnosis not present

## 2023-01-13 DIAGNOSIS — L97811 Non-pressure chronic ulcer of other part of right lower leg limited to breakdown of skin: Secondary | ICD-10-CM | POA: Diagnosis not present

## 2023-01-13 DIAGNOSIS — E1151 Type 2 diabetes mellitus with diabetic peripheral angiopathy without gangrene: Secondary | ICD-10-CM | POA: Diagnosis not present

## 2023-01-13 DIAGNOSIS — I872 Venous insufficiency (chronic) (peripheral): Secondary | ICD-10-CM | POA: Diagnosis not present

## 2023-01-21 DIAGNOSIS — I872 Venous insufficiency (chronic) (peripheral): Secondary | ICD-10-CM | POA: Diagnosis not present

## 2023-01-21 DIAGNOSIS — L97811 Non-pressure chronic ulcer of other part of right lower leg limited to breakdown of skin: Secondary | ICD-10-CM | POA: Diagnosis not present

## 2023-01-21 DIAGNOSIS — I110 Hypertensive heart disease with heart failure: Secondary | ICD-10-CM | POA: Diagnosis not present

## 2023-01-21 DIAGNOSIS — E119 Type 2 diabetes mellitus without complications: Secondary | ICD-10-CM | POA: Diagnosis not present

## 2023-01-21 DIAGNOSIS — E559 Vitamin D deficiency, unspecified: Secondary | ICD-10-CM | POA: Diagnosis not present

## 2023-01-21 DIAGNOSIS — M1990 Unspecified osteoarthritis, unspecified site: Secondary | ICD-10-CM | POA: Diagnosis not present

## 2023-01-21 DIAGNOSIS — Z4800 Encounter for change or removal of nonsurgical wound dressing: Secondary | ICD-10-CM | POA: Diagnosis not present

## 2023-01-21 DIAGNOSIS — I10 Essential (primary) hypertension: Secondary | ICD-10-CM | POA: Diagnosis not present

## 2023-01-21 DIAGNOSIS — I5032 Chronic diastolic (congestive) heart failure: Secondary | ICD-10-CM | POA: Diagnosis not present

## 2023-01-21 DIAGNOSIS — E1151 Type 2 diabetes mellitus with diabetic peripheral angiopathy without gangrene: Secondary | ICD-10-CM | POA: Diagnosis not present

## 2023-01-21 DIAGNOSIS — D518 Other vitamin B12 deficiency anemias: Secondary | ICD-10-CM | POA: Diagnosis not present

## 2023-01-21 DIAGNOSIS — E038 Other specified hypothyroidism: Secondary | ICD-10-CM | POA: Diagnosis not present

## 2023-01-21 DIAGNOSIS — I70223 Atherosclerosis of native arteries of extremities with rest pain, bilateral legs: Secondary | ICD-10-CM | POA: Diagnosis not present

## 2023-01-21 DIAGNOSIS — E7849 Other hyperlipidemia: Secondary | ICD-10-CM | POA: Diagnosis not present

## 2023-01-21 DIAGNOSIS — G8929 Other chronic pain: Secondary | ICD-10-CM | POA: Diagnosis not present

## 2023-01-21 DIAGNOSIS — I509 Heart failure, unspecified: Secondary | ICD-10-CM | POA: Diagnosis not present

## 2023-01-28 DIAGNOSIS — I110 Hypertensive heart disease with heart failure: Secondary | ICD-10-CM | POA: Diagnosis not present

## 2023-01-28 DIAGNOSIS — Z4800 Encounter for change or removal of nonsurgical wound dressing: Secondary | ICD-10-CM | POA: Diagnosis not present

## 2023-01-28 DIAGNOSIS — I872 Venous insufficiency (chronic) (peripheral): Secondary | ICD-10-CM | POA: Diagnosis not present

## 2023-01-28 DIAGNOSIS — I5032 Chronic diastolic (congestive) heart failure: Secondary | ICD-10-CM | POA: Diagnosis not present

## 2023-01-28 DIAGNOSIS — L97811 Non-pressure chronic ulcer of other part of right lower leg limited to breakdown of skin: Secondary | ICD-10-CM | POA: Diagnosis not present

## 2023-01-28 DIAGNOSIS — E1151 Type 2 diabetes mellitus with diabetic peripheral angiopathy without gangrene: Secondary | ICD-10-CM | POA: Diagnosis not present

## 2023-02-03 DIAGNOSIS — I5032 Chronic diastolic (congestive) heart failure: Secondary | ICD-10-CM | POA: Diagnosis not present

## 2023-02-03 DIAGNOSIS — I10 Essential (primary) hypertension: Secondary | ICD-10-CM | POA: Diagnosis not present

## 2023-02-03 DIAGNOSIS — E875 Hyperkalemia: Secondary | ICD-10-CM | POA: Diagnosis not present

## 2023-02-03 DIAGNOSIS — E559 Vitamin D deficiency, unspecified: Secondary | ICD-10-CM | POA: Diagnosis not present

## 2023-02-03 DIAGNOSIS — E790 Hyperuricemia without signs of inflammatory arthritis and tophaceous disease: Secondary | ICD-10-CM | POA: Diagnosis not present

## 2023-02-03 DIAGNOSIS — N1832 Chronic kidney disease, stage 3b: Secondary | ICD-10-CM | POA: Diagnosis not present

## 2023-02-03 DIAGNOSIS — E7800 Pure hypercholesterolemia, unspecified: Secondary | ICD-10-CM | POA: Diagnosis not present

## 2023-02-03 DIAGNOSIS — E1140 Type 2 diabetes mellitus with diabetic neuropathy, unspecified: Secondary | ICD-10-CM | POA: Diagnosis not present

## 2023-02-03 DIAGNOSIS — E785 Hyperlipidemia, unspecified: Secondary | ICD-10-CM | POA: Diagnosis not present

## 2023-02-03 DIAGNOSIS — I252 Old myocardial infarction: Secondary | ICD-10-CM | POA: Diagnosis not present

## 2023-02-04 DIAGNOSIS — I872 Venous insufficiency (chronic) (peripheral): Secondary | ICD-10-CM | POA: Diagnosis not present

## 2023-02-04 DIAGNOSIS — I5032 Chronic diastolic (congestive) heart failure: Secondary | ICD-10-CM | POA: Diagnosis not present

## 2023-02-04 DIAGNOSIS — L97811 Non-pressure chronic ulcer of other part of right lower leg limited to breakdown of skin: Secondary | ICD-10-CM | POA: Diagnosis not present

## 2023-02-04 DIAGNOSIS — I110 Hypertensive heart disease with heart failure: Secondary | ICD-10-CM | POA: Diagnosis not present

## 2023-02-04 DIAGNOSIS — E1151 Type 2 diabetes mellitus with diabetic peripheral angiopathy without gangrene: Secondary | ICD-10-CM | POA: Diagnosis not present

## 2023-02-04 DIAGNOSIS — Z4800 Encounter for change or removal of nonsurgical wound dressing: Secondary | ICD-10-CM | POA: Diagnosis not present

## 2023-02-09 DIAGNOSIS — E1165 Type 2 diabetes mellitus with hyperglycemia: Secondary | ICD-10-CM | POA: Diagnosis not present

## 2023-02-11 DIAGNOSIS — E1151 Type 2 diabetes mellitus with diabetic peripheral angiopathy without gangrene: Secondary | ICD-10-CM | POA: Diagnosis not present

## 2023-02-11 DIAGNOSIS — I110 Hypertensive heart disease with heart failure: Secondary | ICD-10-CM | POA: Diagnosis not present

## 2023-02-11 DIAGNOSIS — I872 Venous insufficiency (chronic) (peripheral): Secondary | ICD-10-CM | POA: Diagnosis not present

## 2023-02-11 DIAGNOSIS — L97811 Non-pressure chronic ulcer of other part of right lower leg limited to breakdown of skin: Secondary | ICD-10-CM | POA: Diagnosis not present

## 2023-02-11 DIAGNOSIS — Z4800 Encounter for change or removal of nonsurgical wound dressing: Secondary | ICD-10-CM | POA: Diagnosis not present

## 2023-02-11 DIAGNOSIS — I5032 Chronic diastolic (congestive) heart failure: Secondary | ICD-10-CM | POA: Diagnosis not present

## 2023-02-17 DIAGNOSIS — L97811 Non-pressure chronic ulcer of other part of right lower leg limited to breakdown of skin: Secondary | ICD-10-CM | POA: Diagnosis not present

## 2023-02-17 DIAGNOSIS — E1151 Type 2 diabetes mellitus with diabetic peripheral angiopathy without gangrene: Secondary | ICD-10-CM | POA: Diagnosis not present

## 2023-02-17 DIAGNOSIS — I110 Hypertensive heart disease with heart failure: Secondary | ICD-10-CM | POA: Diagnosis not present

## 2023-02-17 DIAGNOSIS — I5032 Chronic diastolic (congestive) heart failure: Secondary | ICD-10-CM | POA: Diagnosis not present

## 2023-02-17 DIAGNOSIS — I872 Venous insufficiency (chronic) (peripheral): Secondary | ICD-10-CM | POA: Diagnosis not present

## 2023-02-17 DIAGNOSIS — Z4800 Encounter for change or removal of nonsurgical wound dressing: Secondary | ICD-10-CM | POA: Diagnosis not present

## 2023-02-18 DIAGNOSIS — Z7984 Long term (current) use of oral hypoglycemic drugs: Secondary | ICD-10-CM | POA: Diagnosis not present

## 2023-02-18 DIAGNOSIS — Z7902 Long term (current) use of antithrombotics/antiplatelets: Secondary | ICD-10-CM | POA: Diagnosis not present

## 2023-02-18 DIAGNOSIS — I5032 Chronic diastolic (congestive) heart failure: Secondary | ICD-10-CM | POA: Diagnosis not present

## 2023-02-18 DIAGNOSIS — Z79899 Other long term (current) drug therapy: Secondary | ICD-10-CM | POA: Diagnosis not present

## 2023-02-18 DIAGNOSIS — I890 Lymphedema, not elsewhere classified: Secondary | ICD-10-CM | POA: Diagnosis not present

## 2023-02-18 DIAGNOSIS — L97911 Non-pressure chronic ulcer of unspecified part of right lower leg limited to breakdown of skin: Secondary | ICD-10-CM | POA: Diagnosis not present

## 2023-02-18 DIAGNOSIS — Z881 Allergy status to other antibiotic agents status: Secondary | ICD-10-CM | POA: Diagnosis not present

## 2023-02-18 DIAGNOSIS — Z888 Allergy status to other drugs, medicaments and biological substances status: Secondary | ICD-10-CM | POA: Diagnosis not present

## 2023-02-18 DIAGNOSIS — E11622 Type 2 diabetes mellitus with other skin ulcer: Secondary | ICD-10-CM | POA: Diagnosis not present

## 2023-02-18 DIAGNOSIS — I110 Hypertensive heart disease with heart failure: Secondary | ICD-10-CM | POA: Diagnosis not present

## 2023-02-18 DIAGNOSIS — E785 Hyperlipidemia, unspecified: Secondary | ICD-10-CM | POA: Diagnosis not present

## 2023-02-18 DIAGNOSIS — Z87828 Personal history of other (healed) physical injury and trauma: Secondary | ICD-10-CM | POA: Diagnosis not present

## 2023-02-20 DIAGNOSIS — I5032 Chronic diastolic (congestive) heart failure: Secondary | ICD-10-CM | POA: Diagnosis not present

## 2023-02-20 DIAGNOSIS — Z4800 Encounter for change or removal of nonsurgical wound dressing: Secondary | ICD-10-CM | POA: Diagnosis not present

## 2023-02-20 DIAGNOSIS — I110 Hypertensive heart disease with heart failure: Secondary | ICD-10-CM | POA: Diagnosis not present

## 2023-02-20 DIAGNOSIS — L97811 Non-pressure chronic ulcer of other part of right lower leg limited to breakdown of skin: Secondary | ICD-10-CM | POA: Diagnosis not present

## 2023-02-20 DIAGNOSIS — I872 Venous insufficiency (chronic) (peripheral): Secondary | ICD-10-CM | POA: Diagnosis not present

## 2023-02-20 DIAGNOSIS — E1151 Type 2 diabetes mellitus with diabetic peripheral angiopathy without gangrene: Secondary | ICD-10-CM | POA: Diagnosis not present

## 2023-02-23 DIAGNOSIS — I110 Hypertensive heart disease with heart failure: Secondary | ICD-10-CM | POA: Diagnosis not present

## 2023-02-23 DIAGNOSIS — Z4800 Encounter for change or removal of nonsurgical wound dressing: Secondary | ICD-10-CM | POA: Diagnosis not present

## 2023-02-23 DIAGNOSIS — L97811 Non-pressure chronic ulcer of other part of right lower leg limited to breakdown of skin: Secondary | ICD-10-CM | POA: Diagnosis not present

## 2023-02-23 DIAGNOSIS — E1151 Type 2 diabetes mellitus with diabetic peripheral angiopathy without gangrene: Secondary | ICD-10-CM | POA: Diagnosis not present

## 2023-02-23 DIAGNOSIS — I872 Venous insufficiency (chronic) (peripheral): Secondary | ICD-10-CM | POA: Diagnosis not present

## 2023-02-23 DIAGNOSIS — I5032 Chronic diastolic (congestive) heart failure: Secondary | ICD-10-CM | POA: Diagnosis not present

## 2023-02-25 DIAGNOSIS — I10 Essential (primary) hypertension: Secondary | ICD-10-CM | POA: Diagnosis not present

## 2023-02-25 DIAGNOSIS — I129 Hypertensive chronic kidney disease with stage 1 through stage 4 chronic kidney disease, or unspecified chronic kidney disease: Secondary | ICD-10-CM | POA: Diagnosis not present

## 2023-02-25 DIAGNOSIS — E7849 Other hyperlipidemia: Secondary | ICD-10-CM | POA: Diagnosis not present

## 2023-02-25 DIAGNOSIS — M1990 Unspecified osteoarthritis, unspecified site: Secondary | ICD-10-CM | POA: Diagnosis not present

## 2023-02-25 DIAGNOSIS — E559 Vitamin D deficiency, unspecified: Secondary | ICD-10-CM | POA: Diagnosis not present

## 2023-02-25 DIAGNOSIS — I5032 Chronic diastolic (congestive) heart failure: Secondary | ICD-10-CM | POA: Diagnosis not present

## 2023-02-25 DIAGNOSIS — E038 Other specified hypothyroidism: Secondary | ICD-10-CM | POA: Diagnosis not present

## 2023-02-25 DIAGNOSIS — D518 Other vitamin B12 deficiency anemias: Secondary | ICD-10-CM | POA: Diagnosis not present

## 2023-02-25 DIAGNOSIS — E119 Type 2 diabetes mellitus without complications: Secondary | ICD-10-CM | POA: Diagnosis not present

## 2023-02-26 DIAGNOSIS — I110 Hypertensive heart disease with heart failure: Secondary | ICD-10-CM | POA: Diagnosis not present

## 2023-02-26 DIAGNOSIS — I872 Venous insufficiency (chronic) (peripheral): Secondary | ICD-10-CM | POA: Diagnosis not present

## 2023-02-26 DIAGNOSIS — Z4800 Encounter for change or removal of nonsurgical wound dressing: Secondary | ICD-10-CM | POA: Diagnosis not present

## 2023-02-26 DIAGNOSIS — L97811 Non-pressure chronic ulcer of other part of right lower leg limited to breakdown of skin: Secondary | ICD-10-CM | POA: Diagnosis not present

## 2023-02-26 DIAGNOSIS — I5032 Chronic diastolic (congestive) heart failure: Secondary | ICD-10-CM | POA: Diagnosis not present

## 2023-02-26 DIAGNOSIS — E1151 Type 2 diabetes mellitus with diabetic peripheral angiopathy without gangrene: Secondary | ICD-10-CM | POA: Diagnosis not present

## 2023-03-03 DIAGNOSIS — I110 Hypertensive heart disease with heart failure: Secondary | ICD-10-CM | POA: Diagnosis not present

## 2023-03-03 DIAGNOSIS — I872 Venous insufficiency (chronic) (peripheral): Secondary | ICD-10-CM | POA: Diagnosis not present

## 2023-03-03 DIAGNOSIS — G4700 Insomnia, unspecified: Secondary | ICD-10-CM | POA: Diagnosis not present

## 2023-03-03 DIAGNOSIS — E559 Vitamin D deficiency, unspecified: Secondary | ICD-10-CM | POA: Diagnosis not present

## 2023-03-03 DIAGNOSIS — E785 Hyperlipidemia, unspecified: Secondary | ICD-10-CM | POA: Diagnosis not present

## 2023-03-03 DIAGNOSIS — Z4800 Encounter for change or removal of nonsurgical wound dressing: Secondary | ICD-10-CM | POA: Diagnosis not present

## 2023-03-03 DIAGNOSIS — I252 Old myocardial infarction: Secondary | ICD-10-CM | POA: Diagnosis not present

## 2023-03-03 DIAGNOSIS — E1151 Type 2 diabetes mellitus with diabetic peripheral angiopathy without gangrene: Secondary | ICD-10-CM | POA: Diagnosis not present

## 2023-03-03 DIAGNOSIS — E1140 Type 2 diabetes mellitus with diabetic neuropathy, unspecified: Secondary | ICD-10-CM | POA: Diagnosis not present

## 2023-03-03 DIAGNOSIS — I5032 Chronic diastolic (congestive) heart failure: Secondary | ICD-10-CM | POA: Diagnosis not present

## 2023-03-03 DIAGNOSIS — L97811 Non-pressure chronic ulcer of other part of right lower leg limited to breakdown of skin: Secondary | ICD-10-CM | POA: Diagnosis not present

## 2023-03-03 DIAGNOSIS — E119 Type 2 diabetes mellitus without complications: Secondary | ICD-10-CM | POA: Diagnosis not present

## 2023-03-03 DIAGNOSIS — N1832 Chronic kidney disease, stage 3b: Secondary | ICD-10-CM | POA: Diagnosis not present

## 2023-03-03 DIAGNOSIS — I10 Essential (primary) hypertension: Secondary | ICD-10-CM | POA: Diagnosis not present

## 2023-03-03 DIAGNOSIS — M1990 Unspecified osteoarthritis, unspecified site: Secondary | ICD-10-CM | POA: Diagnosis not present

## 2023-03-05 DIAGNOSIS — I5032 Chronic diastolic (congestive) heart failure: Secondary | ICD-10-CM | POA: Diagnosis not present

## 2023-03-05 DIAGNOSIS — Z4800 Encounter for change or removal of nonsurgical wound dressing: Secondary | ICD-10-CM | POA: Diagnosis not present

## 2023-03-05 DIAGNOSIS — E1151 Type 2 diabetes mellitus with diabetic peripheral angiopathy without gangrene: Secondary | ICD-10-CM | POA: Diagnosis not present

## 2023-03-05 DIAGNOSIS — L97811 Non-pressure chronic ulcer of other part of right lower leg limited to breakdown of skin: Secondary | ICD-10-CM | POA: Diagnosis not present

## 2023-03-05 DIAGNOSIS — I872 Venous insufficiency (chronic) (peripheral): Secondary | ICD-10-CM | POA: Diagnosis not present

## 2023-03-05 DIAGNOSIS — I110 Hypertensive heart disease with heart failure: Secondary | ICD-10-CM | POA: Diagnosis not present

## 2023-03-09 DIAGNOSIS — L97811 Non-pressure chronic ulcer of other part of right lower leg limited to breakdown of skin: Secondary | ICD-10-CM | POA: Diagnosis not present

## 2023-03-09 DIAGNOSIS — E1151 Type 2 diabetes mellitus with diabetic peripheral angiopathy without gangrene: Secondary | ICD-10-CM | POA: Diagnosis not present

## 2023-03-09 DIAGNOSIS — I872 Venous insufficiency (chronic) (peripheral): Secondary | ICD-10-CM | POA: Diagnosis not present

## 2023-03-09 DIAGNOSIS — I110 Hypertensive heart disease with heart failure: Secondary | ICD-10-CM | POA: Diagnosis not present

## 2023-03-09 DIAGNOSIS — I5032 Chronic diastolic (congestive) heart failure: Secondary | ICD-10-CM | POA: Diagnosis not present

## 2023-03-09 DIAGNOSIS — Z4800 Encounter for change or removal of nonsurgical wound dressing: Secondary | ICD-10-CM | POA: Diagnosis not present

## 2023-03-11 DIAGNOSIS — S81801A Unspecified open wound, right lower leg, initial encounter: Secondary | ICD-10-CM | POA: Diagnosis not present

## 2023-03-12 DIAGNOSIS — I110 Hypertensive heart disease with heart failure: Secondary | ICD-10-CM | POA: Diagnosis not present

## 2023-03-12 DIAGNOSIS — D518 Other vitamin B12 deficiency anemias: Secondary | ICD-10-CM | POA: Diagnosis not present

## 2023-03-12 DIAGNOSIS — Z79899 Other long term (current) drug therapy: Secondary | ICD-10-CM | POA: Diagnosis not present

## 2023-03-12 DIAGNOSIS — I5032 Chronic diastolic (congestive) heart failure: Secondary | ICD-10-CM | POA: Diagnosis not present

## 2023-03-12 DIAGNOSIS — Z4800 Encounter for change or removal of nonsurgical wound dressing: Secondary | ICD-10-CM | POA: Diagnosis not present

## 2023-03-12 DIAGNOSIS — I872 Venous insufficiency (chronic) (peripheral): Secondary | ICD-10-CM | POA: Diagnosis not present

## 2023-03-12 DIAGNOSIS — L97811 Non-pressure chronic ulcer of other part of right lower leg limited to breakdown of skin: Secondary | ICD-10-CM | POA: Diagnosis not present

## 2023-03-12 DIAGNOSIS — E1151 Type 2 diabetes mellitus with diabetic peripheral angiopathy without gangrene: Secondary | ICD-10-CM | POA: Diagnosis not present

## 2023-03-12 DIAGNOSIS — E559 Vitamin D deficiency, unspecified: Secondary | ICD-10-CM | POA: Diagnosis not present

## 2023-03-12 DIAGNOSIS — E038 Other specified hypothyroidism: Secondary | ICD-10-CM | POA: Diagnosis not present

## 2023-03-12 DIAGNOSIS — E782 Mixed hyperlipidemia: Secondary | ICD-10-CM | POA: Diagnosis not present

## 2023-03-12 DIAGNOSIS — E119 Type 2 diabetes mellitus without complications: Secondary | ICD-10-CM | POA: Diagnosis not present

## 2023-03-13 DIAGNOSIS — E1165 Type 2 diabetes mellitus with hyperglycemia: Secondary | ICD-10-CM | POA: Diagnosis not present

## 2023-03-17 DIAGNOSIS — I110 Hypertensive heart disease with heart failure: Secondary | ICD-10-CM | POA: Diagnosis not present

## 2023-03-17 DIAGNOSIS — I5032 Chronic diastolic (congestive) heart failure: Secondary | ICD-10-CM | POA: Diagnosis not present

## 2023-03-17 DIAGNOSIS — E1151 Type 2 diabetes mellitus with diabetic peripheral angiopathy without gangrene: Secondary | ICD-10-CM | POA: Diagnosis not present

## 2023-03-17 DIAGNOSIS — L97811 Non-pressure chronic ulcer of other part of right lower leg limited to breakdown of skin: Secondary | ICD-10-CM | POA: Diagnosis not present

## 2023-03-17 DIAGNOSIS — I872 Venous insufficiency (chronic) (peripheral): Secondary | ICD-10-CM | POA: Diagnosis not present

## 2023-03-17 DIAGNOSIS — Z4800 Encounter for change or removal of nonsurgical wound dressing: Secondary | ICD-10-CM | POA: Diagnosis not present

## 2023-03-18 DIAGNOSIS — S81801S Unspecified open wound, right lower leg, sequela: Secondary | ICD-10-CM | POA: Diagnosis not present

## 2023-03-18 DIAGNOSIS — Z881 Allergy status to other antibiotic agents status: Secondary | ICD-10-CM | POA: Diagnosis not present

## 2023-03-18 DIAGNOSIS — I110 Hypertensive heart disease with heart failure: Secondary | ICD-10-CM | POA: Diagnosis not present

## 2023-03-18 DIAGNOSIS — Z7902 Long term (current) use of antithrombotics/antiplatelets: Secondary | ICD-10-CM | POA: Diagnosis not present

## 2023-03-18 DIAGNOSIS — Z79899 Other long term (current) drug therapy: Secondary | ICD-10-CM | POA: Diagnosis not present

## 2023-03-18 DIAGNOSIS — S81801D Unspecified open wound, right lower leg, subsequent encounter: Secondary | ICD-10-CM | POA: Diagnosis not present

## 2023-03-18 DIAGNOSIS — I890 Lymphedema, not elsewhere classified: Secondary | ICD-10-CM | POA: Diagnosis not present

## 2023-03-18 DIAGNOSIS — Z7984 Long term (current) use of oral hypoglycemic drugs: Secondary | ICD-10-CM | POA: Diagnosis not present

## 2023-03-18 DIAGNOSIS — I5032 Chronic diastolic (congestive) heart failure: Secondary | ICD-10-CM | POA: Diagnosis not present

## 2023-03-24 DIAGNOSIS — Z4800 Encounter for change or removal of nonsurgical wound dressing: Secondary | ICD-10-CM | POA: Diagnosis not present

## 2023-03-24 DIAGNOSIS — E1151 Type 2 diabetes mellitus with diabetic peripheral angiopathy without gangrene: Secondary | ICD-10-CM | POA: Diagnosis not present

## 2023-03-24 DIAGNOSIS — I110 Hypertensive heart disease with heart failure: Secondary | ICD-10-CM | POA: Diagnosis not present

## 2023-03-24 DIAGNOSIS — I5032 Chronic diastolic (congestive) heart failure: Secondary | ICD-10-CM | POA: Diagnosis not present

## 2023-03-24 DIAGNOSIS — L97811 Non-pressure chronic ulcer of other part of right lower leg limited to breakdown of skin: Secondary | ICD-10-CM | POA: Diagnosis not present

## 2023-03-24 DIAGNOSIS — I872 Venous insufficiency (chronic) (peripheral): Secondary | ICD-10-CM | POA: Diagnosis not present

## 2023-03-24 DIAGNOSIS — R509 Fever, unspecified: Secondary | ICD-10-CM | POA: Diagnosis not present

## 2023-03-31 DIAGNOSIS — L97811 Non-pressure chronic ulcer of other part of right lower leg limited to breakdown of skin: Secondary | ICD-10-CM | POA: Diagnosis not present

## 2023-03-31 DIAGNOSIS — I872 Venous insufficiency (chronic) (peripheral): Secondary | ICD-10-CM | POA: Diagnosis not present

## 2023-03-31 DIAGNOSIS — N1832 Chronic kidney disease, stage 3b: Secondary | ICD-10-CM | POA: Diagnosis not present

## 2023-03-31 DIAGNOSIS — I5032 Chronic diastolic (congestive) heart failure: Secondary | ICD-10-CM | POA: Diagnosis not present

## 2023-03-31 DIAGNOSIS — Z4800 Encounter for change or removal of nonsurgical wound dressing: Secondary | ICD-10-CM | POA: Diagnosis not present

## 2023-03-31 DIAGNOSIS — M1990 Unspecified osteoarthritis, unspecified site: Secondary | ICD-10-CM | POA: Diagnosis not present

## 2023-03-31 DIAGNOSIS — I252 Old myocardial infarction: Secondary | ICD-10-CM | POA: Diagnosis not present

## 2023-03-31 DIAGNOSIS — E1140 Type 2 diabetes mellitus with diabetic neuropathy, unspecified: Secondary | ICD-10-CM | POA: Diagnosis not present

## 2023-03-31 DIAGNOSIS — E7800 Pure hypercholesterolemia, unspecified: Secondary | ICD-10-CM | POA: Diagnosis not present

## 2023-03-31 DIAGNOSIS — G4700 Insomnia, unspecified: Secondary | ICD-10-CM | POA: Diagnosis not present

## 2023-03-31 DIAGNOSIS — E782 Mixed hyperlipidemia: Secondary | ICD-10-CM | POA: Diagnosis not present

## 2023-03-31 DIAGNOSIS — I10 Essential (primary) hypertension: Secondary | ICD-10-CM | POA: Diagnosis not present

## 2023-03-31 DIAGNOSIS — E1151 Type 2 diabetes mellitus with diabetic peripheral angiopathy without gangrene: Secondary | ICD-10-CM | POA: Diagnosis not present

## 2023-03-31 DIAGNOSIS — I110 Hypertensive heart disease with heart failure: Secondary | ICD-10-CM | POA: Diagnosis not present

## 2023-04-02 DIAGNOSIS — Z130 Encounter for screening for diseases of the blood and blood-forming organs and certain disorders involving the immune mechanism: Secondary | ICD-10-CM | POA: Diagnosis not present

## 2023-04-02 DIAGNOSIS — R749 Abnormal serum enzyme level, unspecified: Secondary | ICD-10-CM | POA: Diagnosis not present

## 2023-04-02 DIAGNOSIS — E8581 Light chain (AL) amyloidosis: Secondary | ICD-10-CM | POA: Diagnosis not present

## 2023-04-02 DIAGNOSIS — Z79899 Other long term (current) drug therapy: Secondary | ICD-10-CM | POA: Diagnosis not present

## 2023-04-03 DIAGNOSIS — Z743 Need for continuous supervision: Secondary | ICD-10-CM | POA: Diagnosis not present

## 2023-04-03 DIAGNOSIS — I872 Venous insufficiency (chronic) (peripheral): Secondary | ICD-10-CM | POA: Diagnosis not present

## 2023-04-03 DIAGNOSIS — Z881 Allergy status to other antibiotic agents status: Secondary | ICD-10-CM | POA: Diagnosis not present

## 2023-04-03 DIAGNOSIS — R0989 Other specified symptoms and signs involving the circulatory and respiratory systems: Secondary | ICD-10-CM | POA: Diagnosis not present

## 2023-04-03 DIAGNOSIS — I700 Atherosclerosis of aorta: Secondary | ICD-10-CM | POA: Diagnosis not present

## 2023-04-03 DIAGNOSIS — E119 Type 2 diabetes mellitus without complications: Secondary | ICD-10-CM | POA: Diagnosis not present

## 2023-04-03 DIAGNOSIS — I5032 Chronic diastolic (congestive) heart failure: Secondary | ICD-10-CM | POA: Diagnosis not present

## 2023-04-03 DIAGNOSIS — R7989 Other specified abnormal findings of blood chemistry: Secondary | ICD-10-CM | POA: Diagnosis not present

## 2023-04-03 DIAGNOSIS — Z886 Allergy status to analgesic agent status: Secondary | ICD-10-CM | POA: Diagnosis not present

## 2023-04-03 DIAGNOSIS — R6889 Other general symptoms and signs: Secondary | ICD-10-CM | POA: Diagnosis not present

## 2023-04-03 DIAGNOSIS — Z888 Allergy status to other drugs, medicaments and biological substances status: Secondary | ICD-10-CM | POA: Diagnosis not present

## 2023-04-03 DIAGNOSIS — E1151 Type 2 diabetes mellitus with diabetic peripheral angiopathy without gangrene: Secondary | ICD-10-CM | POA: Diagnosis not present

## 2023-04-03 DIAGNOSIS — Z7984 Long term (current) use of oral hypoglycemic drugs: Secondary | ICD-10-CM | POA: Diagnosis not present

## 2023-04-03 DIAGNOSIS — Z79899 Other long term (current) drug therapy: Secondary | ICD-10-CM | POA: Diagnosis not present

## 2023-04-03 DIAGNOSIS — E785 Hyperlipidemia, unspecified: Secondary | ICD-10-CM | POA: Diagnosis not present

## 2023-04-03 DIAGNOSIS — D649 Anemia, unspecified: Secondary | ICD-10-CM | POA: Diagnosis not present

## 2023-04-03 DIAGNOSIS — I110 Hypertensive heart disease with heart failure: Secondary | ICD-10-CM | POA: Diagnosis not present

## 2023-04-03 DIAGNOSIS — Z4800 Encounter for change or removal of nonsurgical wound dressing: Secondary | ICD-10-CM | POA: Diagnosis not present

## 2023-04-03 DIAGNOSIS — Z7902 Long term (current) use of antithrombotics/antiplatelets: Secondary | ICD-10-CM | POA: Diagnosis not present

## 2023-04-03 DIAGNOSIS — L97811 Non-pressure chronic ulcer of other part of right lower leg limited to breakdown of skin: Secondary | ICD-10-CM | POA: Diagnosis not present

## 2023-04-06 DIAGNOSIS — L97811 Non-pressure chronic ulcer of other part of right lower leg limited to breakdown of skin: Secondary | ICD-10-CM | POA: Diagnosis not present

## 2023-04-06 DIAGNOSIS — I5032 Chronic diastolic (congestive) heart failure: Secondary | ICD-10-CM | POA: Diagnosis not present

## 2023-04-06 DIAGNOSIS — I110 Hypertensive heart disease with heart failure: Secondary | ICD-10-CM | POA: Diagnosis not present

## 2023-04-06 DIAGNOSIS — E1151 Type 2 diabetes mellitus with diabetic peripheral angiopathy without gangrene: Secondary | ICD-10-CM | POA: Diagnosis not present

## 2023-04-06 DIAGNOSIS — I872 Venous insufficiency (chronic) (peripheral): Secondary | ICD-10-CM | POA: Diagnosis not present

## 2023-04-06 DIAGNOSIS — Z4800 Encounter for change or removal of nonsurgical wound dressing: Secondary | ICD-10-CM | POA: Diagnosis not present

## 2023-04-07 DIAGNOSIS — Z79899 Other long term (current) drug therapy: Secondary | ICD-10-CM | POA: Diagnosis not present

## 2023-04-07 DIAGNOSIS — I5032 Chronic diastolic (congestive) heart failure: Secondary | ICD-10-CM | POA: Diagnosis not present

## 2023-04-08 DIAGNOSIS — M79671 Pain in right foot: Secondary | ICD-10-CM | POA: Diagnosis not present

## 2023-04-08 DIAGNOSIS — E038 Other specified hypothyroidism: Secondary | ICD-10-CM | POA: Diagnosis not present

## 2023-04-08 DIAGNOSIS — M79672 Pain in left foot: Secondary | ICD-10-CM | POA: Diagnosis not present

## 2023-04-08 DIAGNOSIS — M2041 Other hammer toe(s) (acquired), right foot: Secondary | ICD-10-CM | POA: Diagnosis not present

## 2023-04-08 DIAGNOSIS — E119 Type 2 diabetes mellitus without complications: Secondary | ICD-10-CM | POA: Diagnosis not present

## 2023-04-08 DIAGNOSIS — L600 Ingrowing nail: Secondary | ICD-10-CM | POA: Diagnosis not present

## 2023-04-08 DIAGNOSIS — B351 Tinea unguium: Secondary | ICD-10-CM | POA: Diagnosis not present

## 2023-04-08 DIAGNOSIS — M1990 Unspecified osteoarthritis, unspecified site: Secondary | ICD-10-CM | POA: Diagnosis not present

## 2023-04-08 DIAGNOSIS — S91209D Unspecified open wound of unspecified toe(s) with damage to nail, subsequent encounter: Secondary | ICD-10-CM | POA: Diagnosis not present

## 2023-04-08 DIAGNOSIS — E559 Vitamin D deficiency, unspecified: Secondary | ICD-10-CM | POA: Diagnosis not present

## 2023-04-08 DIAGNOSIS — G8929 Other chronic pain: Secondary | ICD-10-CM | POA: Diagnosis not present

## 2023-04-08 DIAGNOSIS — R234 Changes in skin texture: Secondary | ICD-10-CM | POA: Diagnosis not present

## 2023-04-08 DIAGNOSIS — D518 Other vitamin B12 deficiency anemias: Secondary | ICD-10-CM | POA: Diagnosis not present

## 2023-04-08 DIAGNOSIS — E7849 Other hyperlipidemia: Secondary | ICD-10-CM | POA: Diagnosis not present

## 2023-04-08 DIAGNOSIS — I70223 Atherosclerosis of native arteries of extremities with rest pain, bilateral legs: Secondary | ICD-10-CM | POA: Diagnosis not present

## 2023-04-08 DIAGNOSIS — M2042 Other hammer toe(s) (acquired), left foot: Secondary | ICD-10-CM | POA: Diagnosis not present

## 2023-04-08 DIAGNOSIS — I87319 Chronic venous hypertension (idiopathic) with ulcer of unspecified lower extremity: Secondary | ICD-10-CM | POA: Diagnosis not present

## 2023-04-08 DIAGNOSIS — E1140 Type 2 diabetes mellitus with diabetic neuropathy, unspecified: Secondary | ICD-10-CM | POA: Diagnosis not present

## 2023-04-08 DIAGNOSIS — I10 Essential (primary) hypertension: Secondary | ICD-10-CM | POA: Diagnosis not present

## 2023-04-09 DIAGNOSIS — E782 Mixed hyperlipidemia: Secondary | ICD-10-CM | POA: Diagnosis not present

## 2023-04-09 DIAGNOSIS — E119 Type 2 diabetes mellitus without complications: Secondary | ICD-10-CM | POA: Diagnosis not present

## 2023-04-09 DIAGNOSIS — E038 Other specified hypothyroidism: Secondary | ICD-10-CM | POA: Diagnosis not present

## 2023-04-09 DIAGNOSIS — Z4800 Encounter for change or removal of nonsurgical wound dressing: Secondary | ICD-10-CM | POA: Diagnosis not present

## 2023-04-09 DIAGNOSIS — L97811 Non-pressure chronic ulcer of other part of right lower leg limited to breakdown of skin: Secondary | ICD-10-CM | POA: Diagnosis not present

## 2023-04-09 DIAGNOSIS — I5032 Chronic diastolic (congestive) heart failure: Secondary | ICD-10-CM | POA: Diagnosis not present

## 2023-04-09 DIAGNOSIS — E1151 Type 2 diabetes mellitus with diabetic peripheral angiopathy without gangrene: Secondary | ICD-10-CM | POA: Diagnosis not present

## 2023-04-09 DIAGNOSIS — I872 Venous insufficiency (chronic) (peripheral): Secondary | ICD-10-CM | POA: Diagnosis not present

## 2023-04-09 DIAGNOSIS — Z79899 Other long term (current) drug therapy: Secondary | ICD-10-CM | POA: Diagnosis not present

## 2023-04-09 DIAGNOSIS — I110 Hypertensive heart disease with heart failure: Secondary | ICD-10-CM | POA: Diagnosis not present

## 2023-04-09 DIAGNOSIS — E559 Vitamin D deficiency, unspecified: Secondary | ICD-10-CM | POA: Diagnosis not present

## 2023-04-09 DIAGNOSIS — D518 Other vitamin B12 deficiency anemias: Secondary | ICD-10-CM | POA: Diagnosis not present

## 2023-04-13 DIAGNOSIS — E1165 Type 2 diabetes mellitus with hyperglycemia: Secondary | ICD-10-CM | POA: Diagnosis not present

## 2023-04-13 DIAGNOSIS — I70223 Atherosclerosis of native arteries of extremities with rest pain, bilateral legs: Secondary | ICD-10-CM | POA: Diagnosis not present

## 2023-04-14 DIAGNOSIS — L97811 Non-pressure chronic ulcer of other part of right lower leg limited to breakdown of skin: Secondary | ICD-10-CM | POA: Diagnosis not present

## 2023-04-14 DIAGNOSIS — I872 Venous insufficiency (chronic) (peripheral): Secondary | ICD-10-CM | POA: Diagnosis not present

## 2023-04-14 DIAGNOSIS — I5032 Chronic diastolic (congestive) heart failure: Secondary | ICD-10-CM | POA: Diagnosis not present

## 2023-04-14 DIAGNOSIS — Z4800 Encounter for change or removal of nonsurgical wound dressing: Secondary | ICD-10-CM | POA: Diagnosis not present

## 2023-04-14 DIAGNOSIS — I110 Hypertensive heart disease with heart failure: Secondary | ICD-10-CM | POA: Diagnosis not present

## 2023-04-14 DIAGNOSIS — E1151 Type 2 diabetes mellitus with diabetic peripheral angiopathy without gangrene: Secondary | ICD-10-CM | POA: Diagnosis not present

## 2023-04-15 DIAGNOSIS — I77811 Abdominal aortic ectasia: Secondary | ICD-10-CM | POA: Diagnosis not present

## 2023-04-17 DIAGNOSIS — I5032 Chronic diastolic (congestive) heart failure: Secondary | ICD-10-CM | POA: Diagnosis not present

## 2023-04-17 DIAGNOSIS — R0989 Other specified symptoms and signs involving the circulatory and respiratory systems: Secondary | ICD-10-CM | POA: Diagnosis not present

## 2023-04-17 DIAGNOSIS — Z4800 Encounter for change or removal of nonsurgical wound dressing: Secondary | ICD-10-CM | POA: Diagnosis not present

## 2023-04-17 DIAGNOSIS — I110 Hypertensive heart disease with heart failure: Secondary | ICD-10-CM | POA: Diagnosis not present

## 2023-04-17 DIAGNOSIS — E1151 Type 2 diabetes mellitus with diabetic peripheral angiopathy without gangrene: Secondary | ICD-10-CM | POA: Diagnosis not present

## 2023-04-17 DIAGNOSIS — L97811 Non-pressure chronic ulcer of other part of right lower leg limited to breakdown of skin: Secondary | ICD-10-CM | POA: Diagnosis not present

## 2023-04-17 DIAGNOSIS — I6523 Occlusion and stenosis of bilateral carotid arteries: Secondary | ICD-10-CM | POA: Diagnosis not present

## 2023-04-17 DIAGNOSIS — I872 Venous insufficiency (chronic) (peripheral): Secondary | ICD-10-CM | POA: Diagnosis not present

## 2023-04-20 DIAGNOSIS — E1151 Type 2 diabetes mellitus with diabetic peripheral angiopathy without gangrene: Secondary | ICD-10-CM | POA: Diagnosis not present

## 2023-04-20 DIAGNOSIS — I5032 Chronic diastolic (congestive) heart failure: Secondary | ICD-10-CM | POA: Diagnosis not present

## 2023-04-20 DIAGNOSIS — L97811 Non-pressure chronic ulcer of other part of right lower leg limited to breakdown of skin: Secondary | ICD-10-CM | POA: Diagnosis not present

## 2023-04-20 DIAGNOSIS — I110 Hypertensive heart disease with heart failure: Secondary | ICD-10-CM | POA: Diagnosis not present

## 2023-04-20 DIAGNOSIS — Z4800 Encounter for change or removal of nonsurgical wound dressing: Secondary | ICD-10-CM | POA: Diagnosis not present

## 2023-04-20 DIAGNOSIS — I872 Venous insufficiency (chronic) (peripheral): Secondary | ICD-10-CM | POA: Diagnosis not present

## 2023-04-23 DIAGNOSIS — Z4800 Encounter for change or removal of nonsurgical wound dressing: Secondary | ICD-10-CM | POA: Diagnosis not present

## 2023-04-23 DIAGNOSIS — I872 Venous insufficiency (chronic) (peripheral): Secondary | ICD-10-CM | POA: Diagnosis not present

## 2023-04-23 DIAGNOSIS — I110 Hypertensive heart disease with heart failure: Secondary | ICD-10-CM | POA: Diagnosis not present

## 2023-04-23 DIAGNOSIS — I5032 Chronic diastolic (congestive) heart failure: Secondary | ICD-10-CM | POA: Diagnosis not present

## 2023-04-23 DIAGNOSIS — E1151 Type 2 diabetes mellitus with diabetic peripheral angiopathy without gangrene: Secondary | ICD-10-CM | POA: Diagnosis not present

## 2023-04-23 DIAGNOSIS — L97811 Non-pressure chronic ulcer of other part of right lower leg limited to breakdown of skin: Secondary | ICD-10-CM | POA: Diagnosis not present

## 2023-04-28 DIAGNOSIS — L97811 Non-pressure chronic ulcer of other part of right lower leg limited to breakdown of skin: Secondary | ICD-10-CM | POA: Diagnosis not present

## 2023-04-28 DIAGNOSIS — Z4800 Encounter for change or removal of nonsurgical wound dressing: Secondary | ICD-10-CM | POA: Diagnosis not present

## 2023-04-28 DIAGNOSIS — I5032 Chronic diastolic (congestive) heart failure: Secondary | ICD-10-CM | POA: Diagnosis not present

## 2023-04-28 DIAGNOSIS — E1151 Type 2 diabetes mellitus with diabetic peripheral angiopathy without gangrene: Secondary | ICD-10-CM | POA: Diagnosis not present

## 2023-04-28 DIAGNOSIS — I110 Hypertensive heart disease with heart failure: Secondary | ICD-10-CM | POA: Diagnosis not present

## 2023-04-28 DIAGNOSIS — I872 Venous insufficiency (chronic) (peripheral): Secondary | ICD-10-CM | POA: Diagnosis not present

## 2023-04-30 DIAGNOSIS — E559 Vitamin D deficiency, unspecified: Secondary | ICD-10-CM | POA: Diagnosis not present

## 2023-04-30 DIAGNOSIS — I5032 Chronic diastolic (congestive) heart failure: Secondary | ICD-10-CM | POA: Diagnosis not present

## 2023-04-30 DIAGNOSIS — I129 Hypertensive chronic kidney disease with stage 1 through stage 4 chronic kidney disease, or unspecified chronic kidney disease: Secondary | ICD-10-CM | POA: Diagnosis not present

## 2023-04-30 DIAGNOSIS — E7849 Other hyperlipidemia: Secondary | ICD-10-CM | POA: Diagnosis not present

## 2023-04-30 DIAGNOSIS — I10 Essential (primary) hypertension: Secondary | ICD-10-CM | POA: Diagnosis not present

## 2023-04-30 DIAGNOSIS — E119 Type 2 diabetes mellitus without complications: Secondary | ICD-10-CM | POA: Diagnosis not present

## 2023-04-30 DIAGNOSIS — M1990 Unspecified osteoarthritis, unspecified site: Secondary | ICD-10-CM | POA: Diagnosis not present

## 2023-04-30 DIAGNOSIS — G8929 Other chronic pain: Secondary | ICD-10-CM | POA: Diagnosis not present

## 2023-04-30 DIAGNOSIS — E038 Other specified hypothyroidism: Secondary | ICD-10-CM | POA: Diagnosis not present

## 2023-04-30 DIAGNOSIS — D518 Other vitamin B12 deficiency anemias: Secondary | ICD-10-CM | POA: Diagnosis not present

## 2023-05-05 DIAGNOSIS — Z4800 Encounter for change or removal of nonsurgical wound dressing: Secondary | ICD-10-CM | POA: Diagnosis not present

## 2023-05-05 DIAGNOSIS — E7800 Pure hypercholesterolemia, unspecified: Secondary | ICD-10-CM | POA: Diagnosis not present

## 2023-05-05 DIAGNOSIS — I10 Essential (primary) hypertension: Secondary | ICD-10-CM | POA: Diagnosis not present

## 2023-05-05 DIAGNOSIS — I5032 Chronic diastolic (congestive) heart failure: Secondary | ICD-10-CM | POA: Diagnosis not present

## 2023-05-05 DIAGNOSIS — E785 Hyperlipidemia, unspecified: Secondary | ICD-10-CM | POA: Diagnosis not present

## 2023-05-05 DIAGNOSIS — G4700 Insomnia, unspecified: Secondary | ICD-10-CM | POA: Diagnosis not present

## 2023-05-05 DIAGNOSIS — E782 Mixed hyperlipidemia: Secondary | ICD-10-CM | POA: Diagnosis not present

## 2023-05-05 DIAGNOSIS — I872 Venous insufficiency (chronic) (peripheral): Secondary | ICD-10-CM | POA: Diagnosis not present

## 2023-05-05 DIAGNOSIS — E1151 Type 2 diabetes mellitus with diabetic peripheral angiopathy without gangrene: Secondary | ICD-10-CM | POA: Diagnosis not present

## 2023-05-05 DIAGNOSIS — L97811 Non-pressure chronic ulcer of other part of right lower leg limited to breakdown of skin: Secondary | ICD-10-CM | POA: Diagnosis not present

## 2023-05-05 DIAGNOSIS — I110 Hypertensive heart disease with heart failure: Secondary | ICD-10-CM | POA: Diagnosis not present

## 2023-05-05 DIAGNOSIS — M1990 Unspecified osteoarthritis, unspecified site: Secondary | ICD-10-CM | POA: Diagnosis not present

## 2023-05-05 DIAGNOSIS — E1140 Type 2 diabetes mellitus with diabetic neuropathy, unspecified: Secondary | ICD-10-CM | POA: Diagnosis not present

## 2023-05-05 DIAGNOSIS — N1832 Chronic kidney disease, stage 3b: Secondary | ICD-10-CM | POA: Diagnosis not present

## 2023-05-12 DIAGNOSIS — E1151 Type 2 diabetes mellitus with diabetic peripheral angiopathy without gangrene: Secondary | ICD-10-CM | POA: Diagnosis not present

## 2023-05-12 DIAGNOSIS — L97811 Non-pressure chronic ulcer of other part of right lower leg limited to breakdown of skin: Secondary | ICD-10-CM | POA: Diagnosis not present

## 2023-05-12 DIAGNOSIS — Z4800 Encounter for change or removal of nonsurgical wound dressing: Secondary | ICD-10-CM | POA: Diagnosis not present

## 2023-05-12 DIAGNOSIS — I5032 Chronic diastolic (congestive) heart failure: Secondary | ICD-10-CM | POA: Diagnosis not present

## 2023-05-12 DIAGNOSIS — I872 Venous insufficiency (chronic) (peripheral): Secondary | ICD-10-CM | POA: Diagnosis not present

## 2023-05-12 DIAGNOSIS — I110 Hypertensive heart disease with heart failure: Secondary | ICD-10-CM | POA: Diagnosis not present

## 2023-05-13 DIAGNOSIS — Z4689 Encounter for fitting and adjustment of other specified devices: Secondary | ICD-10-CM | POA: Diagnosis not present

## 2023-05-13 DIAGNOSIS — Z79899 Other long term (current) drug therapy: Secondary | ICD-10-CM | POA: Diagnosis not present

## 2023-05-13 DIAGNOSIS — Z7984 Long term (current) use of oral hypoglycemic drugs: Secondary | ICD-10-CM | POA: Diagnosis not present

## 2023-05-13 DIAGNOSIS — I110 Hypertensive heart disease with heart failure: Secondary | ICD-10-CM | POA: Diagnosis not present

## 2023-05-13 DIAGNOSIS — E1165 Type 2 diabetes mellitus with hyperglycemia: Secondary | ICD-10-CM | POA: Diagnosis not present

## 2023-05-13 DIAGNOSIS — Z7902 Long term (current) use of antithrombotics/antiplatelets: Secondary | ICD-10-CM | POA: Diagnosis not present

## 2023-05-13 DIAGNOSIS — E11622 Type 2 diabetes mellitus with other skin ulcer: Secondary | ICD-10-CM | POA: Diagnosis not present

## 2023-05-13 DIAGNOSIS — L97911 Non-pressure chronic ulcer of unspecified part of right lower leg limited to breakdown of skin: Secondary | ICD-10-CM | POA: Diagnosis not present

## 2023-05-13 DIAGNOSIS — I5032 Chronic diastolic (congestive) heart failure: Secondary | ICD-10-CM | POA: Diagnosis not present

## 2023-05-13 DIAGNOSIS — E785 Hyperlipidemia, unspecified: Secondary | ICD-10-CM | POA: Diagnosis not present

## 2023-05-19 DIAGNOSIS — I872 Venous insufficiency (chronic) (peripheral): Secondary | ICD-10-CM | POA: Diagnosis not present

## 2023-05-19 DIAGNOSIS — E1151 Type 2 diabetes mellitus with diabetic peripheral angiopathy without gangrene: Secondary | ICD-10-CM | POA: Diagnosis not present

## 2023-05-19 DIAGNOSIS — I110 Hypertensive heart disease with heart failure: Secondary | ICD-10-CM | POA: Diagnosis not present

## 2023-05-19 DIAGNOSIS — Z4800 Encounter for change or removal of nonsurgical wound dressing: Secondary | ICD-10-CM | POA: Diagnosis not present

## 2023-05-19 DIAGNOSIS — L97811 Non-pressure chronic ulcer of other part of right lower leg limited to breakdown of skin: Secondary | ICD-10-CM | POA: Diagnosis not present

## 2023-05-19 DIAGNOSIS — I5032 Chronic diastolic (congestive) heart failure: Secondary | ICD-10-CM | POA: Diagnosis not present

## 2023-05-21 DIAGNOSIS — Z4800 Encounter for change or removal of nonsurgical wound dressing: Secondary | ICD-10-CM | POA: Diagnosis not present

## 2023-05-21 DIAGNOSIS — E1151 Type 2 diabetes mellitus with diabetic peripheral angiopathy without gangrene: Secondary | ICD-10-CM | POA: Diagnosis not present

## 2023-05-21 DIAGNOSIS — L97811 Non-pressure chronic ulcer of other part of right lower leg limited to breakdown of skin: Secondary | ICD-10-CM | POA: Diagnosis not present

## 2023-05-21 DIAGNOSIS — I872 Venous insufficiency (chronic) (peripheral): Secondary | ICD-10-CM | POA: Diagnosis not present

## 2023-05-21 DIAGNOSIS — I110 Hypertensive heart disease with heart failure: Secondary | ICD-10-CM | POA: Diagnosis not present

## 2023-05-21 DIAGNOSIS — I5032 Chronic diastolic (congestive) heart failure: Secondary | ICD-10-CM | POA: Diagnosis not present

## 2023-05-26 DIAGNOSIS — I872 Venous insufficiency (chronic) (peripheral): Secondary | ICD-10-CM | POA: Diagnosis not present

## 2023-05-26 DIAGNOSIS — I110 Hypertensive heart disease with heart failure: Secondary | ICD-10-CM | POA: Diagnosis not present

## 2023-05-26 DIAGNOSIS — I5032 Chronic diastolic (congestive) heart failure: Secondary | ICD-10-CM | POA: Diagnosis not present

## 2023-05-26 DIAGNOSIS — D649 Anemia, unspecified: Secondary | ICD-10-CM | POA: Diagnosis not present

## 2023-05-26 DIAGNOSIS — L97811 Non-pressure chronic ulcer of other part of right lower leg limited to breakdown of skin: Secondary | ICD-10-CM | POA: Diagnosis not present

## 2023-06-01 DIAGNOSIS — E7849 Other hyperlipidemia: Secondary | ICD-10-CM | POA: Diagnosis not present

## 2023-06-01 DIAGNOSIS — E038 Other specified hypothyroidism: Secondary | ICD-10-CM | POA: Diagnosis not present

## 2023-06-01 DIAGNOSIS — I10 Essential (primary) hypertension: Secondary | ICD-10-CM | POA: Diagnosis not present

## 2023-06-01 DIAGNOSIS — D518 Other vitamin B12 deficiency anemias: Secondary | ICD-10-CM | POA: Diagnosis not present

## 2023-06-01 DIAGNOSIS — G8929 Other chronic pain: Secondary | ICD-10-CM | POA: Diagnosis not present

## 2023-06-01 DIAGNOSIS — I5032 Chronic diastolic (congestive) heart failure: Secondary | ICD-10-CM | POA: Diagnosis not present

## 2023-06-01 DIAGNOSIS — M1990 Unspecified osteoarthritis, unspecified site: Secondary | ICD-10-CM | POA: Diagnosis not present

## 2023-06-01 DIAGNOSIS — E559 Vitamin D deficiency, unspecified: Secondary | ICD-10-CM | POA: Diagnosis not present

## 2023-06-01 DIAGNOSIS — E119 Type 2 diabetes mellitus without complications: Secondary | ICD-10-CM | POA: Diagnosis not present

## 2023-06-02 DIAGNOSIS — N1832 Chronic kidney disease, stage 3b: Secondary | ICD-10-CM | POA: Diagnosis not present

## 2023-06-02 DIAGNOSIS — I5032 Chronic diastolic (congestive) heart failure: Secondary | ICD-10-CM | POA: Diagnosis not present

## 2023-06-02 DIAGNOSIS — M1990 Unspecified osteoarthritis, unspecified site: Secondary | ICD-10-CM | POA: Diagnosis not present

## 2023-06-02 DIAGNOSIS — I10 Essential (primary) hypertension: Secondary | ICD-10-CM | POA: Diagnosis not present

## 2023-06-02 DIAGNOSIS — D649 Anemia, unspecified: Secondary | ICD-10-CM | POA: Diagnosis not present

## 2023-06-02 DIAGNOSIS — I872 Venous insufficiency (chronic) (peripheral): Secondary | ICD-10-CM | POA: Diagnosis not present

## 2023-06-02 DIAGNOSIS — L97811 Non-pressure chronic ulcer of other part of right lower leg limited to breakdown of skin: Secondary | ICD-10-CM | POA: Diagnosis not present

## 2023-06-02 DIAGNOSIS — I110 Hypertensive heart disease with heart failure: Secondary | ICD-10-CM | POA: Diagnosis not present

## 2023-06-02 DIAGNOSIS — E1140 Type 2 diabetes mellitus with diabetic neuropathy, unspecified: Secondary | ICD-10-CM | POA: Diagnosis not present

## 2023-06-09 DIAGNOSIS — D649 Anemia, unspecified: Secondary | ICD-10-CM | POA: Diagnosis not present

## 2023-06-09 DIAGNOSIS — I872 Venous insufficiency (chronic) (peripheral): Secondary | ICD-10-CM | POA: Diagnosis not present

## 2023-06-09 DIAGNOSIS — I5032 Chronic diastolic (congestive) heart failure: Secondary | ICD-10-CM | POA: Diagnosis not present

## 2023-06-09 DIAGNOSIS — I110 Hypertensive heart disease with heart failure: Secondary | ICD-10-CM | POA: Diagnosis not present

## 2023-06-09 DIAGNOSIS — L97811 Non-pressure chronic ulcer of other part of right lower leg limited to breakdown of skin: Secondary | ICD-10-CM | POA: Diagnosis not present

## 2023-06-13 DIAGNOSIS — E1165 Type 2 diabetes mellitus with hyperglycemia: Secondary | ICD-10-CM | POA: Diagnosis not present

## 2023-06-15 DIAGNOSIS — E119 Type 2 diabetes mellitus without complications: Secondary | ICD-10-CM | POA: Diagnosis not present

## 2023-06-15 DIAGNOSIS — I5020 Unspecified systolic (congestive) heart failure: Secondary | ICD-10-CM | POA: Diagnosis not present

## 2023-06-16 DIAGNOSIS — I872 Venous insufficiency (chronic) (peripheral): Secondary | ICD-10-CM | POA: Diagnosis not present

## 2023-06-16 DIAGNOSIS — I5020 Unspecified systolic (congestive) heart failure: Secondary | ICD-10-CM | POA: Diagnosis not present

## 2023-06-16 DIAGNOSIS — D649 Anemia, unspecified: Secondary | ICD-10-CM | POA: Diagnosis not present

## 2023-06-16 DIAGNOSIS — N189 Chronic kidney disease, unspecified: Secondary | ICD-10-CM | POA: Diagnosis not present

## 2023-06-16 DIAGNOSIS — I5032 Chronic diastolic (congestive) heart failure: Secondary | ICD-10-CM | POA: Diagnosis not present

## 2023-06-16 DIAGNOSIS — I110 Hypertensive heart disease with heart failure: Secondary | ICD-10-CM | POA: Diagnosis not present

## 2023-06-16 DIAGNOSIS — L97811 Non-pressure chronic ulcer of other part of right lower leg limited to breakdown of skin: Secondary | ICD-10-CM | POA: Diagnosis not present

## 2023-06-16 DIAGNOSIS — N179 Acute kidney failure, unspecified: Secondary | ICD-10-CM | POA: Diagnosis not present

## 2023-06-16 DIAGNOSIS — Z79899 Other long term (current) drug therapy: Secondary | ICD-10-CM | POA: Diagnosis not present

## 2023-06-22 DIAGNOSIS — I5020 Unspecified systolic (congestive) heart failure: Secondary | ICD-10-CM | POA: Diagnosis not present

## 2023-06-23 DIAGNOSIS — I872 Venous insufficiency (chronic) (peripheral): Secondary | ICD-10-CM | POA: Diagnosis not present

## 2023-06-23 DIAGNOSIS — L97811 Non-pressure chronic ulcer of other part of right lower leg limited to breakdown of skin: Secondary | ICD-10-CM | POA: Diagnosis not present

## 2023-06-23 DIAGNOSIS — D649 Anemia, unspecified: Secondary | ICD-10-CM | POA: Diagnosis not present

## 2023-06-23 DIAGNOSIS — I110 Hypertensive heart disease with heart failure: Secondary | ICD-10-CM | POA: Diagnosis not present

## 2023-06-23 DIAGNOSIS — I5032 Chronic diastolic (congestive) heart failure: Secondary | ICD-10-CM | POA: Diagnosis not present

## 2023-06-26 DIAGNOSIS — E119 Type 2 diabetes mellitus without complications: Secondary | ICD-10-CM | POA: Diagnosis not present

## 2023-06-26 DIAGNOSIS — I10 Essential (primary) hypertension: Secondary | ICD-10-CM | POA: Diagnosis not present

## 2023-06-26 DIAGNOSIS — Z79899 Other long term (current) drug therapy: Secondary | ICD-10-CM | POA: Diagnosis not present

## 2023-06-26 DIAGNOSIS — E782 Mixed hyperlipidemia: Secondary | ICD-10-CM | POA: Diagnosis not present

## 2023-06-26 DIAGNOSIS — G8929 Other chronic pain: Secondary | ICD-10-CM | POA: Diagnosis not present

## 2023-06-26 DIAGNOSIS — E038 Other specified hypothyroidism: Secondary | ICD-10-CM | POA: Diagnosis not present

## 2023-06-26 DIAGNOSIS — E559 Vitamin D deficiency, unspecified: Secondary | ICD-10-CM | POA: Diagnosis not present

## 2023-06-26 DIAGNOSIS — D519 Vitamin B12 deficiency anemia, unspecified: Secondary | ICD-10-CM | POA: Diagnosis not present

## 2023-06-26 DIAGNOSIS — M1990 Unspecified osteoarthritis, unspecified site: Secondary | ICD-10-CM | POA: Diagnosis not present

## 2023-06-26 DIAGNOSIS — I110 Hypertensive heart disease with heart failure: Secondary | ICD-10-CM | POA: Diagnosis not present

## 2023-06-26 DIAGNOSIS — D518 Other vitamin B12 deficiency anemias: Secondary | ICD-10-CM | POA: Diagnosis not present

## 2023-06-26 DIAGNOSIS — E7849 Other hyperlipidemia: Secondary | ICD-10-CM | POA: Diagnosis not present

## 2023-06-26 DIAGNOSIS — I5032 Chronic diastolic (congestive) heart failure: Secondary | ICD-10-CM | POA: Diagnosis not present

## 2023-06-30 DIAGNOSIS — L97811 Non-pressure chronic ulcer of other part of right lower leg limited to breakdown of skin: Secondary | ICD-10-CM | POA: Diagnosis not present

## 2023-06-30 DIAGNOSIS — I110 Hypertensive heart disease with heart failure: Secondary | ICD-10-CM | POA: Diagnosis not present

## 2023-06-30 DIAGNOSIS — I872 Venous insufficiency (chronic) (peripheral): Secondary | ICD-10-CM | POA: Diagnosis not present

## 2023-06-30 DIAGNOSIS — E1140 Type 2 diabetes mellitus with diabetic neuropathy, unspecified: Secondary | ICD-10-CM | POA: Diagnosis not present

## 2023-06-30 DIAGNOSIS — I252 Old myocardial infarction: Secondary | ICD-10-CM | POA: Diagnosis not present

## 2023-06-30 DIAGNOSIS — I10 Essential (primary) hypertension: Secondary | ICD-10-CM | POA: Diagnosis not present

## 2023-06-30 DIAGNOSIS — I5032 Chronic diastolic (congestive) heart failure: Secondary | ICD-10-CM | POA: Diagnosis not present

## 2023-06-30 DIAGNOSIS — I5020 Unspecified systolic (congestive) heart failure: Secondary | ICD-10-CM | POA: Diagnosis not present

## 2023-06-30 DIAGNOSIS — D649 Anemia, unspecified: Secondary | ICD-10-CM | POA: Diagnosis not present

## 2023-06-30 DIAGNOSIS — E119 Type 2 diabetes mellitus without complications: Secondary | ICD-10-CM | POA: Diagnosis not present

## 2023-06-30 DIAGNOSIS — N184 Chronic kidney disease, stage 4 (severe): Secondary | ICD-10-CM | POA: Diagnosis not present

## 2023-06-30 DIAGNOSIS — M1990 Unspecified osteoarthritis, unspecified site: Secondary | ICD-10-CM | POA: Diagnosis not present

## 2023-07-01 DIAGNOSIS — S81801A Unspecified open wound, right lower leg, initial encounter: Secondary | ICD-10-CM | POA: Diagnosis not present

## 2023-07-02 DIAGNOSIS — I110 Hypertensive heart disease with heart failure: Secondary | ICD-10-CM | POA: Diagnosis not present

## 2023-07-02 DIAGNOSIS — Z79899 Other long term (current) drug therapy: Secondary | ICD-10-CM | POA: Diagnosis not present

## 2023-07-05 DIAGNOSIS — L97811 Non-pressure chronic ulcer of other part of right lower leg limited to breakdown of skin: Secondary | ICD-10-CM | POA: Diagnosis not present

## 2023-07-05 DIAGNOSIS — I110 Hypertensive heart disease with heart failure: Secondary | ICD-10-CM | POA: Diagnosis not present

## 2023-07-05 DIAGNOSIS — I872 Venous insufficiency (chronic) (peripheral): Secondary | ICD-10-CM | POA: Diagnosis not present

## 2023-07-05 DIAGNOSIS — I5032 Chronic diastolic (congestive) heart failure: Secondary | ICD-10-CM | POA: Diagnosis not present

## 2023-07-05 DIAGNOSIS — D649 Anemia, unspecified: Secondary | ICD-10-CM | POA: Diagnosis not present

## 2023-07-12 DIAGNOSIS — D649 Anemia, unspecified: Secondary | ICD-10-CM | POA: Diagnosis not present

## 2023-07-12 DIAGNOSIS — I5032 Chronic diastolic (congestive) heart failure: Secondary | ICD-10-CM | POA: Diagnosis not present

## 2023-07-12 DIAGNOSIS — L97811 Non-pressure chronic ulcer of other part of right lower leg limited to breakdown of skin: Secondary | ICD-10-CM | POA: Diagnosis not present

## 2023-07-12 DIAGNOSIS — I872 Venous insufficiency (chronic) (peripheral): Secondary | ICD-10-CM | POA: Diagnosis not present

## 2023-07-12 DIAGNOSIS — I110 Hypertensive heart disease with heart failure: Secondary | ICD-10-CM | POA: Diagnosis not present

## 2023-07-13 DIAGNOSIS — E1165 Type 2 diabetes mellitus with hyperglycemia: Secondary | ICD-10-CM | POA: Diagnosis not present

## 2023-07-19 DIAGNOSIS — L97811 Non-pressure chronic ulcer of other part of right lower leg limited to breakdown of skin: Secondary | ICD-10-CM | POA: Diagnosis not present

## 2023-07-21 DIAGNOSIS — I5032 Chronic diastolic (congestive) heart failure: Secondary | ICD-10-CM | POA: Diagnosis not present

## 2023-07-21 DIAGNOSIS — I110 Hypertensive heart disease with heart failure: Secondary | ICD-10-CM | POA: Diagnosis not present

## 2023-07-21 DIAGNOSIS — D649 Anemia, unspecified: Secondary | ICD-10-CM | POA: Diagnosis not present

## 2023-07-21 DIAGNOSIS — I872 Venous insufficiency (chronic) (peripheral): Secondary | ICD-10-CM | POA: Diagnosis not present

## 2023-07-21 DIAGNOSIS — L97811 Non-pressure chronic ulcer of other part of right lower leg limited to breakdown of skin: Secondary | ICD-10-CM | POA: Diagnosis not present

## 2023-08-15 DIAGNOSIS — 419620001 Death: Secondary | SNOMED CT | POA: Diagnosis not present

## 2023-08-15 DEATH — deceased
